# Patient Record
Sex: Male | Born: 1970
Health system: Southern US, Community
[De-identification: ages and names within clinical notes are randomized; demographics above are authoritative.]

## PROBLEM LIST (undated history)

## (undated) DIAGNOSIS — C3490 Malignant neoplasm of unspecified part of unspecified bronchus or lung: Secondary | ICD-10-CM

## (undated) DIAGNOSIS — J45909 Unspecified asthma, uncomplicated: Secondary | ICD-10-CM

## (undated) HISTORY — DX: Malignant neoplasm of unspecified part of unspecified bronchus or lung: C34.90

## (undated) HISTORY — PX: CYST EXCISION: SHX5701

---

## 2005-10-26 ENCOUNTER — Emergency Department: Payer: Self-pay | Admitting: Unknown Physician Specialty

## 2015-12-16 ENCOUNTER — Emergency Department
Admission: EM | Admit: 2015-12-16 | Discharge: 2015-12-16 | Disposition: A | Discharge status: Home / Self Care | Payer: No Typology Code available for payment source | Attending: Emergency Medicine | Admitting: Emergency Medicine

## 2015-12-16 ENCOUNTER — Encounter: Payer: Self-pay | Admitting: Emergency Medicine

## 2015-12-16 ENCOUNTER — Emergency Department: Payer: No Typology Code available for payment source

## 2015-12-16 DIAGNOSIS — S99912A Unspecified injury of left ankle, initial encounter: Secondary | ICD-10-CM | POA: Diagnosis not present

## 2015-12-16 DIAGNOSIS — F17210 Nicotine dependence, cigarettes, uncomplicated: Secondary | ICD-10-CM | POA: Insufficient documentation

## 2015-12-16 DIAGNOSIS — Y92410 Unspecified street and highway as the place of occurrence of the external cause: Secondary | ICD-10-CM | POA: Insufficient documentation

## 2015-12-16 DIAGNOSIS — Y9389 Activity, other specified: Secondary | ICD-10-CM | POA: Diagnosis not present

## 2015-12-16 DIAGNOSIS — S20212A Contusion of left front wall of thorax, initial encounter: Secondary | ICD-10-CM | POA: Diagnosis not present

## 2015-12-16 DIAGNOSIS — S299XXA Unspecified injury of thorax, initial encounter: Secondary | ICD-10-CM | POA: Diagnosis present

## 2015-12-16 DIAGNOSIS — Y998 Other external cause status: Secondary | ICD-10-CM | POA: Diagnosis not present

## 2015-12-16 DIAGNOSIS — S6992XA Unspecified injury of left wrist, hand and finger(s), initial encounter: Secondary | ICD-10-CM | POA: Insufficient documentation
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
## 2015-12-16 MED ORDER — IBUPROFEN 800 MG PO TABS: 800.0000 mg | ORAL_TABLET | Freq: Three times a day (TID) | ORAL | Status: DC | PRN

## 2015-12-16 MED ORDER — CYCLOBENZAPRINE HCL 10 MG PO TABS: 10.0000 mg | ORAL_TABLET | Freq: Three times a day (TID) | ORAL | Status: DC | PRN
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
## 2019-10-17 ENCOUNTER — Other Ambulatory Visit (HOSPITAL_COMMUNITY): Payer: Self-pay | Admitting: Physician Assistant

## 2019-10-17 ENCOUNTER — Other Ambulatory Visit: Payer: Self-pay | Admitting: Physician Assistant

## 2019-10-17 DIAGNOSIS — R918 Other nonspecific abnormal finding of lung field: Secondary | ICD-10-CM

## 2019-10-24 ENCOUNTER — Ambulatory Visit: Admission: RE | Admit: 2019-10-24 | Payer: 59 | Source: Ambulatory Visit

## 2019-10-31 ENCOUNTER — Ambulatory Visit: Payer: 59

## 2019-11-04 ENCOUNTER — Emergency Department
Admission: EM | Admit: 2019-11-04 | Discharge: 2019-11-04 | Disposition: A | Discharge status: Home / Self Care | Payer: 59 | Attending: Emergency Medicine | Admitting: Emergency Medicine

## 2019-11-04 ENCOUNTER — Emergency Department: Payer: 59

## 2019-11-04 ENCOUNTER — Encounter: Payer: Self-pay | Admitting: Oncology

## 2019-11-04 ENCOUNTER — Encounter: Payer: Self-pay | Admitting: *Deleted

## 2019-11-04 ENCOUNTER — Other Ambulatory Visit: Payer: Self-pay

## 2019-11-04 DIAGNOSIS — R079 Chest pain, unspecified: Secondary | ICD-10-CM | POA: Diagnosis present

## 2019-11-04 DIAGNOSIS — R918 Other nonspecific abnormal finding of lung field: Secondary | ICD-10-CM

## 2019-11-04 DIAGNOSIS — F17210 Nicotine dependence, cigarettes, uncomplicated: Secondary | ICD-10-CM | POA: Insufficient documentation

## 2019-11-04 LAB — BASIC METABOLIC PANEL
Anion gap: 9 (ref 5–15)
BUN: 9 mg/dL (ref 6–20)
CO2: 26 mmol/L (ref 22–32)
Calcium: 8.7 mg/dL — ABNORMAL LOW (ref 8.9–10.3)
Chloride: 103 mmol/L (ref 98–111)
Creatinine, Ser: 0.93 mg/dL (ref 0.61–1.24)
GFR calc Af Amer: >60 mL/min (ref >60)
GFR calc non Af Amer: >60 mL/min (ref >60)
Glucose, Bld: 126 mg/dL — ABNORMAL HIGH (ref 70–99)
Potassium: 3.8 mmol/L (ref 3.5–5.1)
Sodium: 138 mmol/L (ref 135–145)

## 2019-11-04 LAB — CBC
HCT: 37.8 % — ABNORMAL LOW (ref 39.0–52.0)
Hemoglobin: 12.5 g/dL — ABNORMAL LOW (ref 13.0–17.0)
MCH: 28.2 pg (ref 26.0–34.0)
MCHC: 33.1 g/dL (ref 30.0–36.0)
MCV: 85.3 fL (ref 80.0–100.0)
Platelets: 533 10*3/uL — ABNORMAL HIGH (ref 150–400)
RBC: 4.43 MIL/uL (ref 4.22–5.81)
RDW: 14.5 % (ref 11.5–15.5)
WBC: 5.7 10*3/uL (ref 4.0–10.5)
nRBC: 0 % (ref 0.0–0.2)

## 2019-11-04 LAB — TROPONIN I (HIGH SENSITIVITY): Troponin I (High Sensitivity): 3 ng/L (ref <18)

## 2019-11-04 IMAGING — CR DG CHEST 2V
1 series · 2 of 2 positions shown · non-contrast
Comparison: Chest radiograph [DATE]

CLINICAL DATA: Chest pain. Additional history provided: Chest pain
for 1 month.

EXAM:
CHEST - 2 VIEW

[Series 1: dg chest 2 view · 0.14mm/px · 2 of 2 slices shown]
[im 1/2]
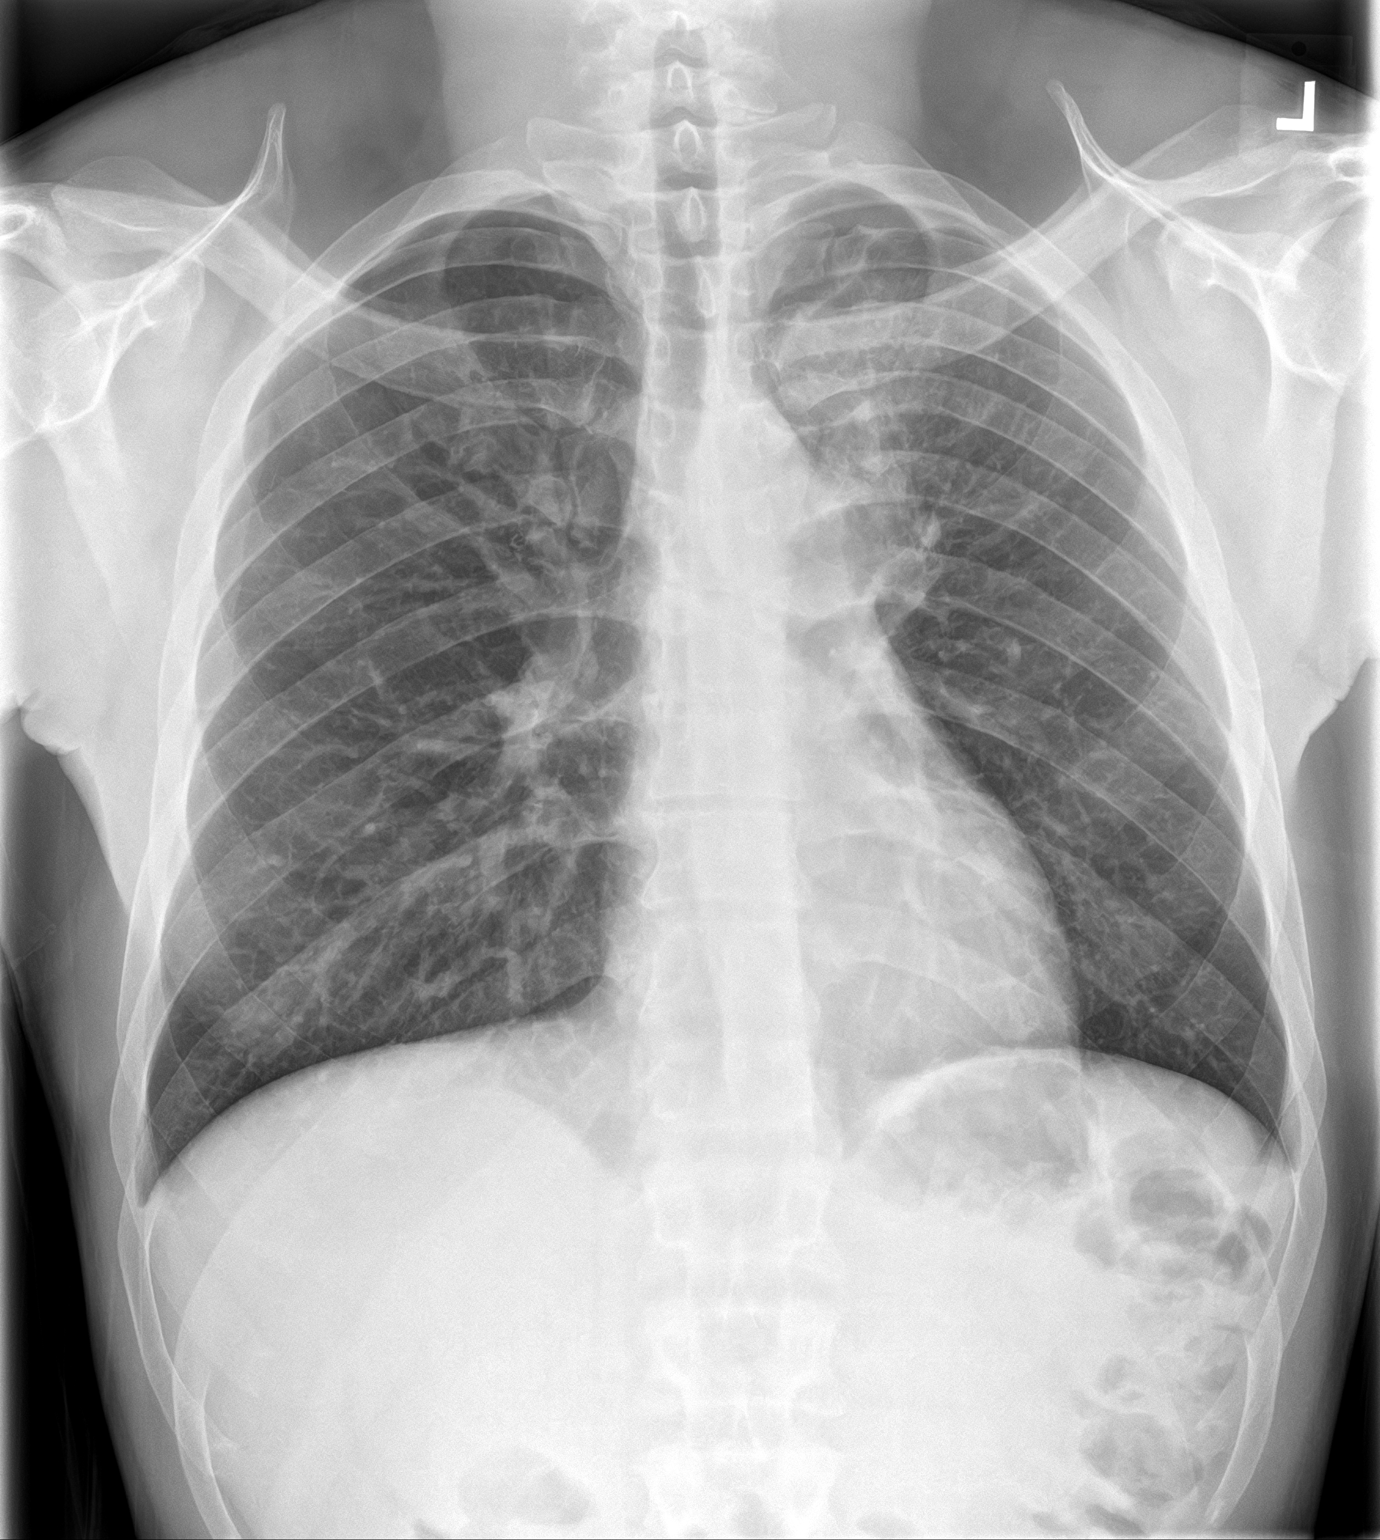
[im 2/2]
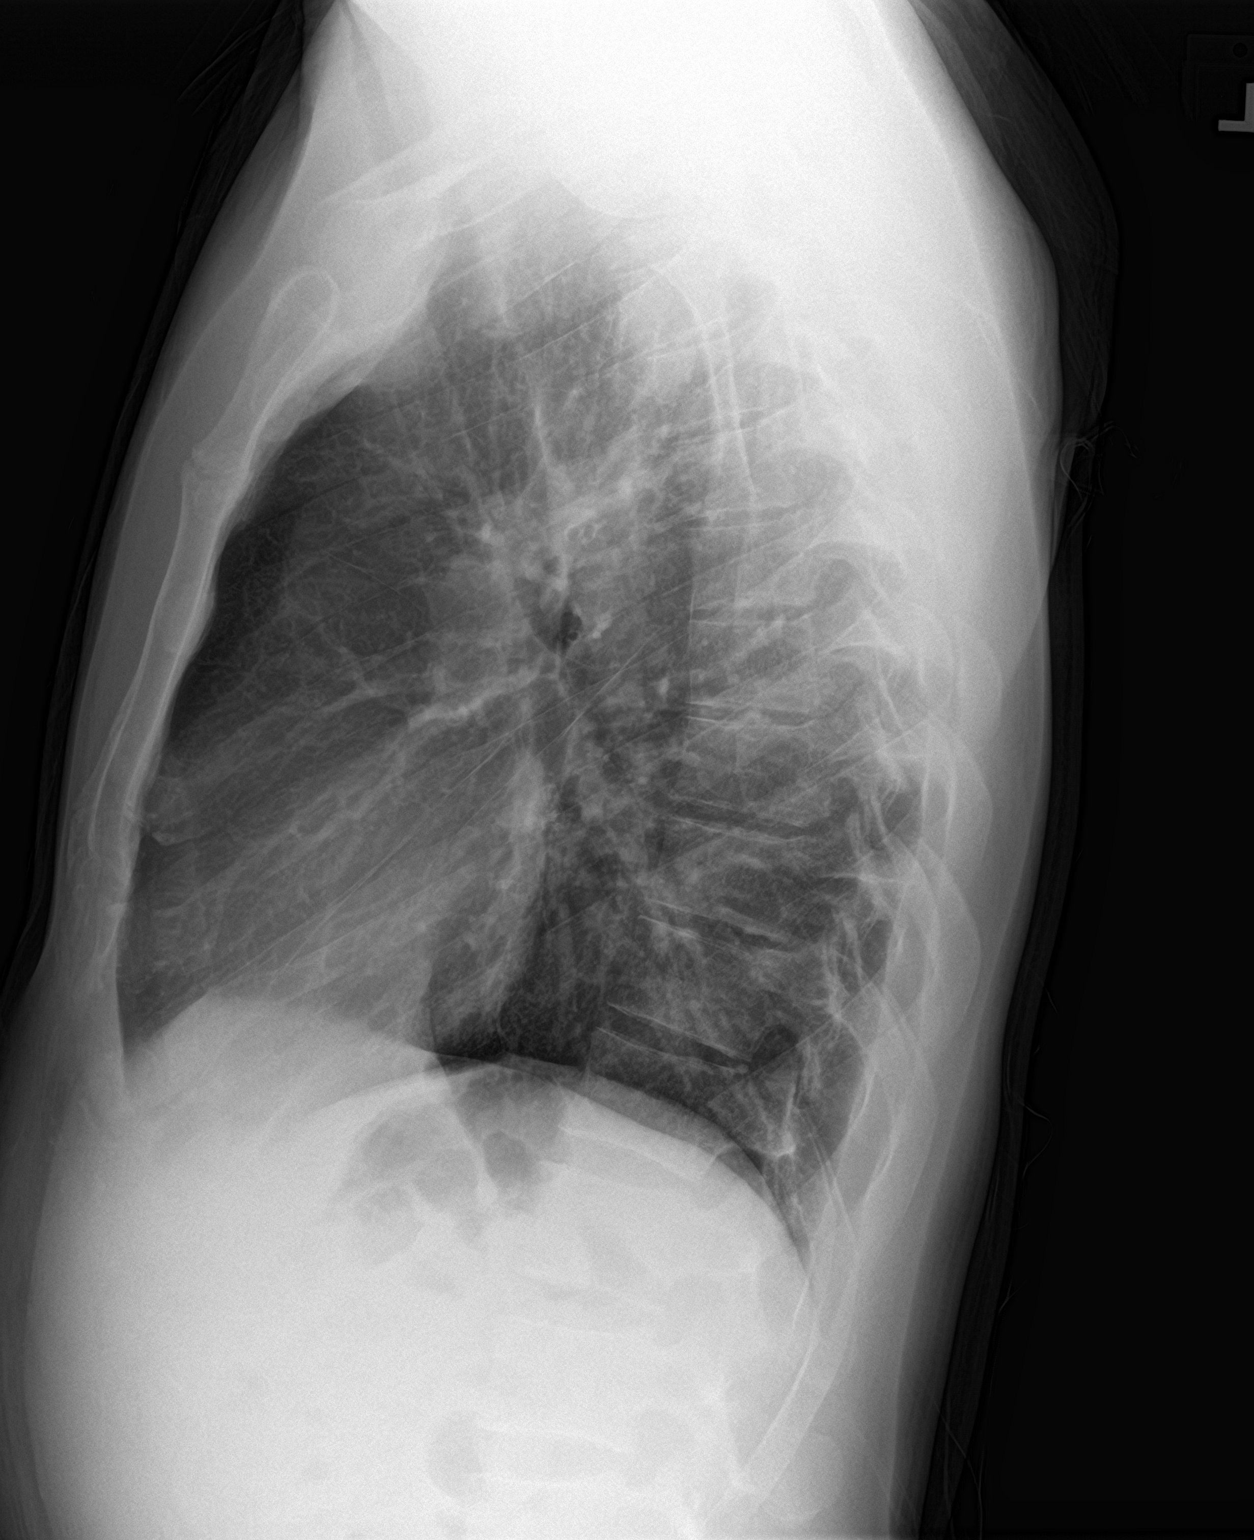

[2 of 2 positions shown; findings below may reference images not displayed]

FINDINGS: Heart size within normal limits.

There is increased density within the posterior left lung apex
likely reflecting a mass and highly suspicious for bronchogenic
carcinoma. The lungs are otherwise clear. No evidence of pleural
effusion or pneumothorax. No acute bony abnormality.

These results were called by telephone at the time of interpretation
on [DATE] at [DATE] to provider ZANDAMELA, who verbally
acknowledged these results.
IMPRESSION: Increased density within the posterior left lung apex likely
reflecting a mass and highly suspicious for bronchogenic carcinoma.
Chest CT with intravenous contrast recommended for further
evaluation.

## 2019-11-04 IMAGING — CT CT ANGIO CHEST
2 of 6 series · 18 of 46 positions shown · IV contrast (APPLIED)
Comparison: Radiograph of same day.

CLINICAL DATA: Chest pain.

EXAM:
CT ANGIOGRAPHY CHEST WITH CONTRAST
TECHNIQUE: Multidetector CT imaging of the chest was performed using the
standard protocol during bolus administration of intravenous
contrast. Multiplanar CT image reconstructions and MIPs were
obtained to evaluate the vascular anatomy.
CONTRAST:  75mL OMNIPAQUE IOHEXOL 350 MG/ML SOLN

[Series 5: thins · axial · 0.65mm/px · z∈[-295,-39]mm · 15 of 282 slices shown]
[im 13/282  lung]
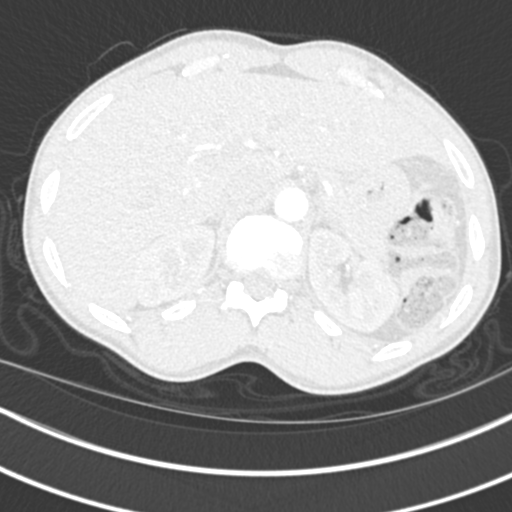
[im 37/282  soft-tissue]
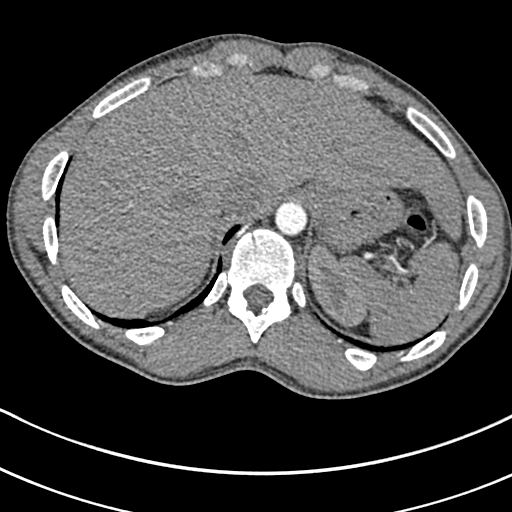
[im 49/282  lung]
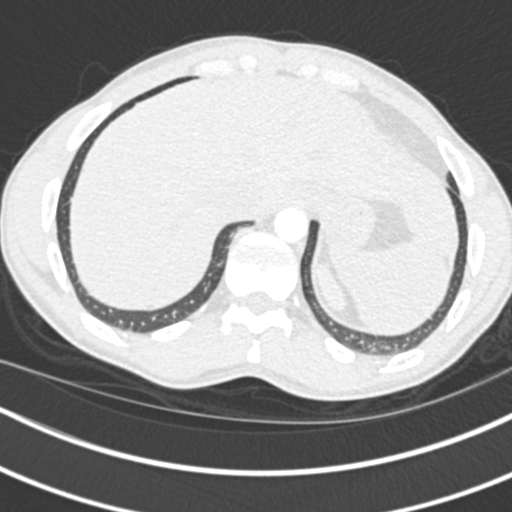
[im 74/282  soft-tissue]
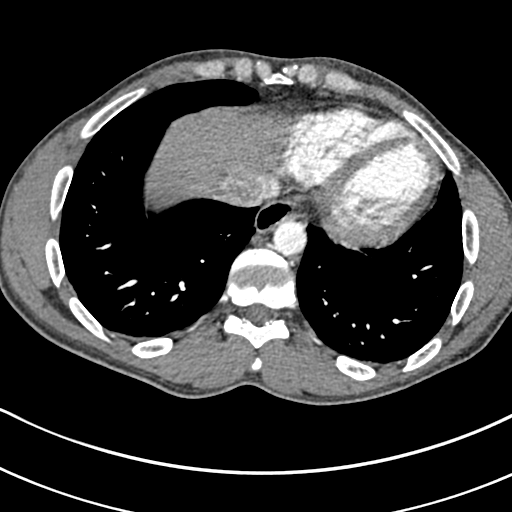
[im 86/282  lung]
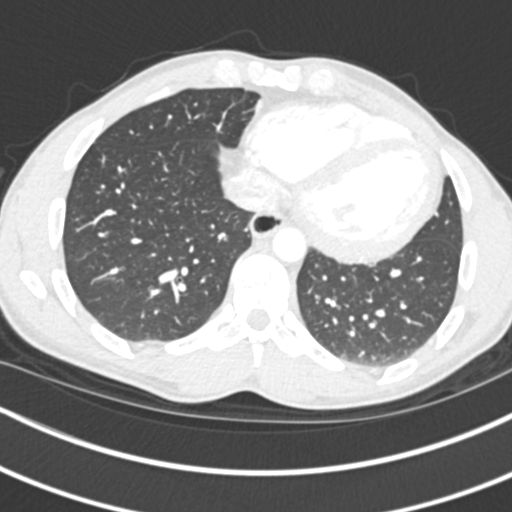
[im 110/282  soft-tissue]
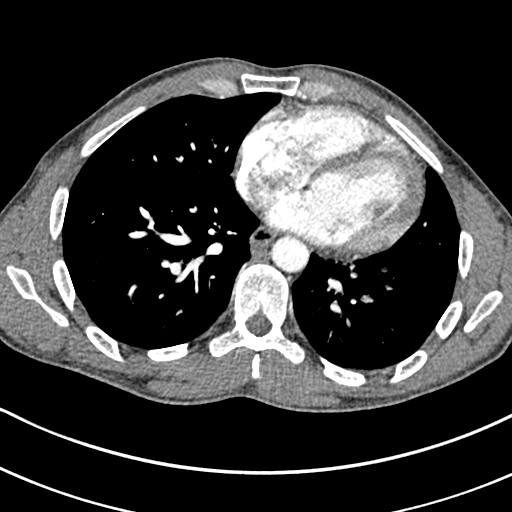
[im 123/282  lung]
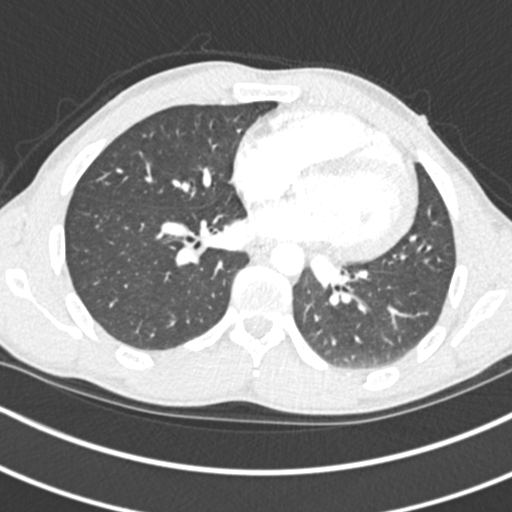
[im 147/282  soft-tissue]
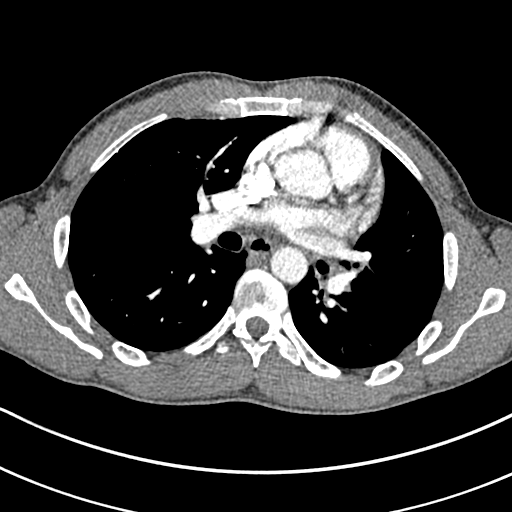
[im 159/282  lung]
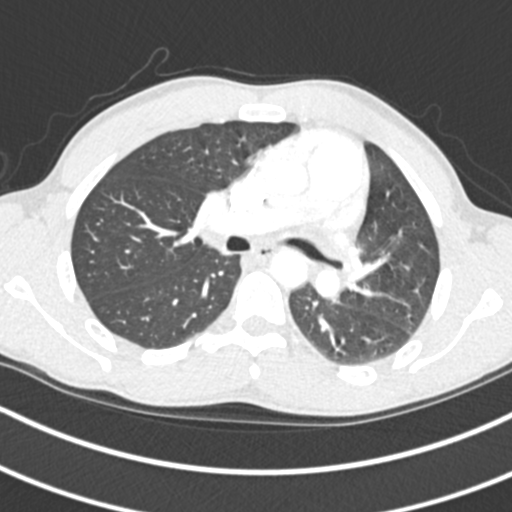
[im 172/282  soft-tissue]
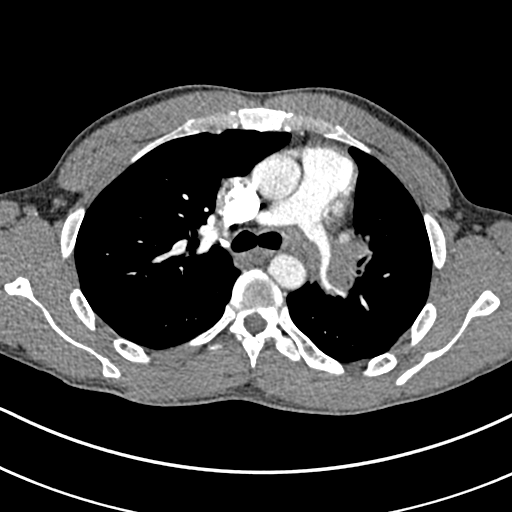
[im 196/282  lung]
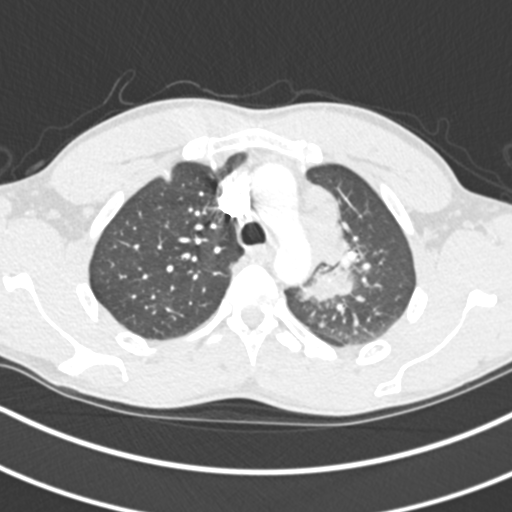
[im 208/282  soft-tissue]
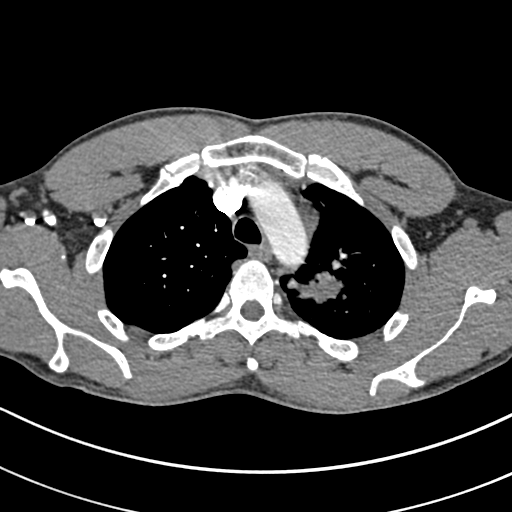
[im 233/282  lung]
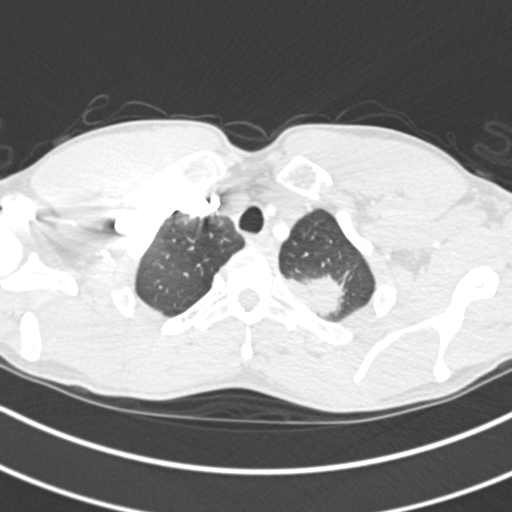
[im 245/282  soft-tissue]
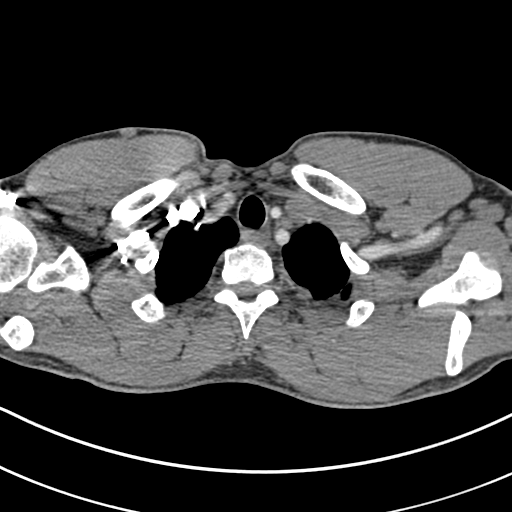
[im 269/282  lung]
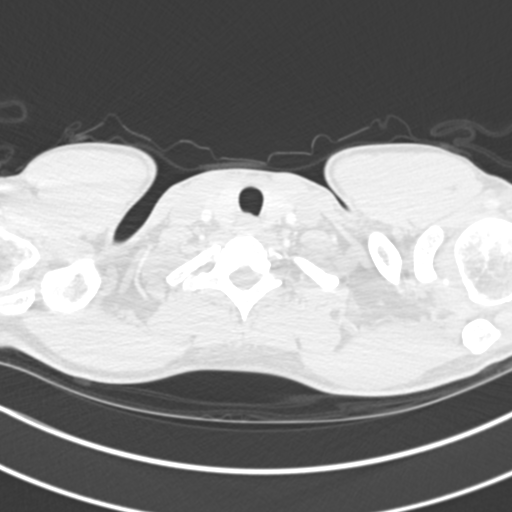

[Series 7: coronal mpr · coronal · 0.55mm/px · 3 of 86 slices shown]
[im 22/86  soft-tissue]
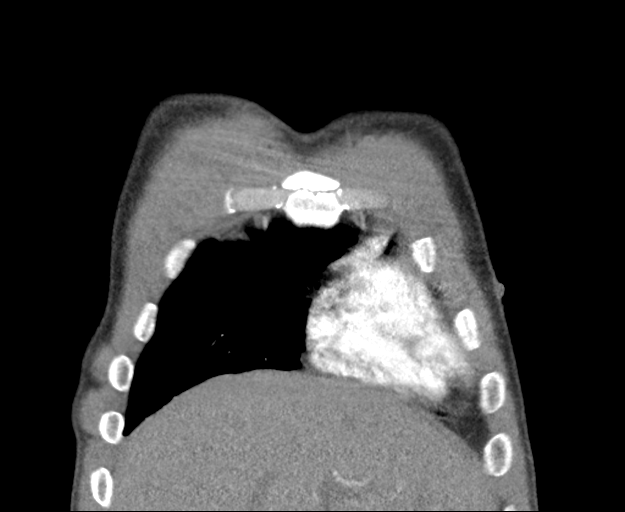
[im 43/86  soft-tissue]
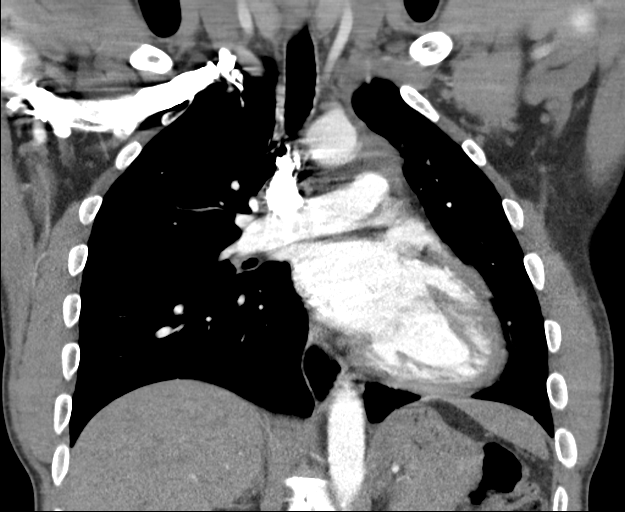
[im 64/86  soft-tissue]
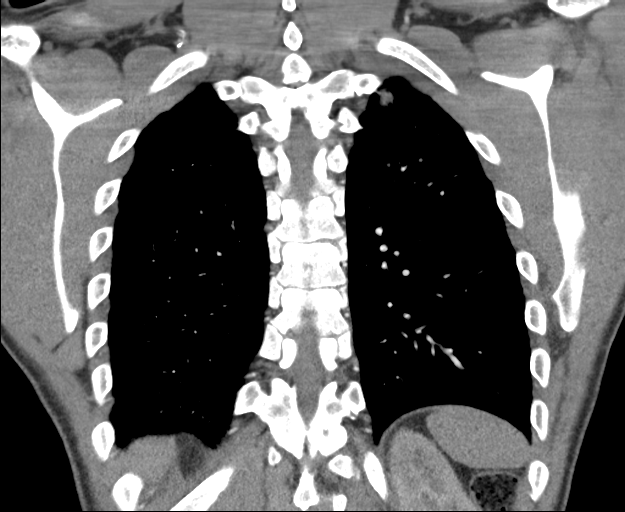

[18 of 46 positions shown; findings below may reference images not displayed]

FINDINGS: Cardiovascular: Satisfactory opacification of the pulmonary arteries
to the segmental level. No evidence of pulmonary embolism. Normal
heart size. No pericardial effusion. However, there is significant
extrinsic compression of the left pulmonary artery secondary to
surrounding adenopathy.

Mediastinum/Nodes: 4.4 x 3.3 cm lobulated mass is noted in
aortopulmonary window. 2.6 x 2.4 cm left hilar mass is noted.
Thyroid gland is unremarkable. The esophagus appears normal.

Lungs/Pleura: No pneumothorax or pleural effusion is noted. Right
lung is clear. 3.8 x 3.3 cm mass is noted in left lung apex
concerning for malignancy.

Upper Abdomen: No acute abnormality.

Musculoskeletal: No chest wall abnormality. No acute or significant
osseous findings.

Review of the MIP images confirms the above findings.
IMPRESSION: No definite evidence of pulmonary embolus.

3.8 x 3.3 cm left apical mass is noted concerning for malignancy.
Enlarged aortopulmonary window and left hilar masses or adenopathy
is noted consistent with metastatic disease. PET scan is recommended
for further evaluation.

## 2019-11-04 MED ORDER — SODIUM CHLORIDE 0.9% FLUSH: 3.0000 mL | Freq: Once | INTRAVENOUS | Status: DC

## 2019-11-04 MED ORDER — IOHEXOL 350 MG/ML SOLN
75.0000 mL | Freq: Once | INTRAVENOUS | Status: AC | PRN
  Administered 2019-11-04: 75 mL via INTRAVENOUS
  Filled 2019-11-04: qty 75

## 2019-11-04 NOTE — ED Triage Notes (Signed)
Pt c/o left sided chest pain for the past month, states his PCP did a chest xray and saw something and ordered for a CT chest but states the insurance was denying it and his PCP referred him to the ED. Pt is in NAD,

## 2019-11-04 NOTE — ED Provider Notes (Addendum)
[facility] Emergency Department Provider Note    First MD Initiated Contact with Patient 11/04/19 1254     (approximate)  I have reviewed the triage vital signs and the nursing notes.   HISTORY  Chief Complaint Chest Pain    HPI Kyle Guerrero is a 48 y.o. male cigarette smoker approximately half a pack per day for the past 20 years presents to the emergency department with a 1 month history of left upper chest discomfort.  Patient was seen at urgent care where x-ray was performed and states that he was told he needed a CT scan done however due to his lack of insurance it was never performed.  Patient states that he currently has 7 out of 10 left upper chest discomfort.  Patient also admits to intermittent cough.      History reviewed. No pertinent past medical history.  There are no active problems to display for this patient.   History reviewed. No pertinent surgical history.  Prior to Admission medications   Medication Sig Start Date End Date Taking? Authorizing Provider  cyclobenzaprine (FLEXERIL) 10 MG tablet Take 1 tablet (10 mg total) by mouth every 8 (eight) hours as needed for muscle spasms. 12/16/15   Beers, Pierce Crane, PA-C  ibuprofen (ADVIL,MOTRIN) 800 MG tablet Take 1 tablet (800 mg total) by mouth every 8 (eight) hours as needed. 12/16/15   Arlyss Repress, PA-C    Allergies Patient has no known allergies.  No family history on file.  Social History Social History   Tobacco Use  . Smoking status: Current Every Day Smoker    Packs/day: 2.00    Types: Cigarettes  . Smokeless tobacco: Never Used  Substance Use Topics  . Alcohol use: Yes  . Drug use: Yes    Types: Marijuana    Review of Systems Constitutional: No fever/chills Eyes: No visual changes. ENT: No sore throat. Cardiovascular: Positive for left chest pain Respiratory: Denies shortness of breath. Gastrointestinal: No abdominal pain.  No nausea, no vomiting.   No diarrhea.  No constipation. Genitourinary: Negative for dysuria. Musculoskeletal: Negative for neck pain.  Negative for back pain. Integumentary: Negative for rash. Neurological: Negative for headaches, focal weakness or numbness.  ____________________________________________   PHYSICAL EXAM:  VITAL SIGNS: ED Triage Vitals  Enc Vitals Group     BP 11/04/19 1005 (!) 144/70     Pulse Rate 11/04/19 1005 71     Resp 11/04/19 1005 16     Temp 11/04/19 1005 97.9 F (36.6 C)     Temp Source 11/04/19 1005 Oral     SpO2 11/04/19 1005 100 %     Weight 11/04/19 1006 76.2 kg (168 lb)     Height 11/04/19 1006 1.803 m (5\' 11" )     Head Circumference --      Peak Flow --      Pain Score 11/04/19 1006 7     Pain Loc --      Pain Edu? --      Excl. in [place]? --     Constitutional: Alert and oriented.  Eyes: Conjunctivae are normal.  Mouth/Throat: Patient is wearing a mask. Neck: No stridor.  No meningeal signs.   Cardiovascular: Normal rate, regular rhythm. Good peripheral circulation. Grossly normal heart sounds. Respiratory: Normal respiratory effort.  No retractions. Gastrointestinal: Soft and nontender. No distention.  Musculoskeletal: No lower extremity tenderness nor edema. No gross deformities of extremities. Neurologic:  Normal speech and language. No gross focal  neurologic deficits are appreciated.  Skin:  Skin is warm, dry and intact. Psychiatric: Mood and affect are normal. Speech and behavior are normal.  ____________________________________________   LABS (all labs ordered are listed, but only abnormal results are displayed)  Labs Reviewed  BASIC METABOLIC PANEL - Abnormal; Notable for the following components:      Result Value   Glucose, Bld 126 (*)    Calcium 8.7 (*)    All other components within normal limits  CBC - Abnormal; Notable for the following components:   Hemoglobin 12.5 (*)    HCT 37.8 (*)    Platelets 533 (*)    All other components within normal  limits  TROPONIN I (HIGH SENSITIVITY)  TROPONIN I (HIGH SENSITIVITY)   ______________________________ ____________________________________________  RADIOLOGY I, Gregor Hams, personally viewed and evaluated these images (plain radiographs) as part of my medical decision making, as well as reviewing the written report by the radiologist.  ED MD interpretation: Left apex mass concerning for bronchogenic carcinoma per radiologist with recommendation for CT chest to be performed.  Official radiology report(s): Dg Chest 2 View  Result Date: 11/04/2019 CLINICAL DATA:  Chest pain. Additional history provided: Chest pain for 1 month. EXAM: CHEST - 2 VIEW COMPARISON:  Chest radiograph 12/17/2015 FINDINGS: Heart size within normal limits. There is increased density within the posterior left lung apex likely reflecting a mass and highly suspicious for bronchogenic carcinoma. The lungs are otherwise clear. No evidence of pleural effusion or pneumothorax. No acute bony abnormality. These results were called by telephone at the time of interpretation on 11/04/2019 at 11:05 am to provider [facility], who verbally acknowledged these results. IMPRESSION: Increased density within the posterior left lung apex likely reflecting a mass and highly suspicious for bronchogenic carcinoma. Chest CT with intravenous contrast recommended for further evaluation. Electronically Signed   By: Kellie Simmering DO   On: 11/04/2019 11:06      Procedures  ED ECG REPORT I, Grandfather Ernst Bowler, the attending physician, personally viewed and interpreted this ECG.   Date: 11/15/2019  EKG Time: 10:24 AM  Rate: 68  Rhythm: Normal Sinus Rhythm  Axis: Normal  Intervals:Normal  ST&T Change: None  ____________________________________________   INITIAL IMPRESSION / MDM / ASSESSMENT AND PLAN / ED COURSE  As part of my medical decision making, I reviewed the following data within the electronic MEDICAL RECORD NUMBER  48 year old male  presenting with above-stated history and physical exam concerning for possible lung cancer.  Chest x-ray revealed left apex mass and as such CT scan of the chest was performed.  CT scan system with chest x-ray finding and concerning for possible hilar lymphadenopathy due to metastases.  I spoke with the patient at length regarding all clinical findings.  I advised the patient of the absolute necessity of following up with oncology Dr. Janese Banks.  I spoke with Dr. Janese Banks personally who spoke with RN from the oncology clinic quite expeditiously to ensure follow-up with the patient.  ____________________________________________  FINAL CLINICAL IMPRESSION(S) / ED DIAGNOSES  Final diagnoses:  Mass of left lung     MEDICATIONS GIVEN DURING THIS VISIT:  Medications  sodium chloride flush (NS) 0.9 % injection 3 mL (has no administration in time range)  iohexol (OMNIPAQUE) 350 MG/ML injection 75 mL (has no administration in time range)     ED Discharge Orders    None      *Please note:  Kyle Guerrero was evaluated in Emergency Department on 11/04/2019 for  the symptoms described in the history of present illness. He was evaluated in the context of the global COVID-19 pandemic, which necessitated consideration that the patient might be at risk for infection with the SARS-CoV-2 virus that causes COVID-19. Institutional protocols and algorithms that pertain to the evaluation of patients at risk for COVID-19 are in a state of rapid change based on information released by regulatory bodies including the CDC and federal and state organizations. These policies and algorithms were followed during the patient's care in the ED.  Some ED evaluations and interventions may be delayed as a result of limited staffing during the pandemic.*  Note:  This document was prepared using Dragon voice recognition software and may include unintentional dictation errors.   Gregor Hams, MD 11/04/19 1408    Gregor Hams, MD 11/15/19 [phone]

## 2019-11-04 NOTE — Progress Notes (Signed)
Informed by Dr. Owens Shark from ER. Patient is 48 yr old presenting with chest pain. CT PE shows 3.8 cm LUL lung mass and mediastinal masses/ adenopathy. He needs PET CT for further evaluation. I will see him in my clinic after PET/CT  Dr. Randa Evens, MD, MPH [facility] [phone] 11/04/2019 1:58 PM

## 2019-11-04 NOTE — ED Provider Notes (Signed)
[facility] Emergency Department Provider Note  ____________________________________________   None    (approximate)   I have reviewed the triage vital signs and the nursing notes.   Patient has been triaged with a MSE exam performed by myself at a minimum. Based on symptoms and screening exam, patient may receive a more in-depth exam, labs, imaging as detailed below. Patients have been advised of this setting and exam type at the time of patient interview.    HISTORY  Chief Complaint Chest Pain    HPI Kyle Guerrero is a 48 y.o. male presents to the emergency department with a complaint of cp and pressure.  Had a cxr and was told there is something there and needs a ct.  + smoker, no abd pain, no sob   Patient will receive a medical screening exam as detailed below.  Based off of this exam, more in depth exam, labs, imaging will be performed as needed for complaint.  Patient care will be eventually transferred to another provider in the emergency department for final exam, diagnosis and disposition.    History reviewed. No pertinent past medical history.  There are no active problems to display for this patient.   History reviewed. No pertinent surgical history.  Prior to Admission medications   Medication Sig Start Date End Date Taking? Authorizing Provider  cyclobenzaprine (FLEXERIL) 10 MG tablet Take 1 tablet (10 mg total) by mouth every 8 (eight) hours as needed for muscle spasms. 12/16/15   Beers, Pierce Crane, PA-C  ibuprofen (ADVIL,MOTRIN) 800 MG tablet Take 1 tablet (800 mg total) by mouth every 8 (eight) hours as needed. 12/16/15   Arlyss Repress, PA-C    Allergies Patient has no known allergies.  No family history on file.  Social History Social History   Tobacco Use  . Smoking status: Current Every Day Smoker    Packs/day: 2.00    Types: Cigarettes  . Smokeless tobacco: Never Used  Substance Use Topics  . Alcohol use: Yes   . Drug use: Yes    Types: Marijuana    Review of Systems Constitutional: denies fever ENT: deniesnasal congestion/rhinorhea. deniessore throat Cardiovascular: positive chest pain. Respiratory: no cough. noshortness of breath/difficulty breathing, chest feels tight Gastroenterology: no abdominal pain Musculoskeletal: no for musculoskeletal pain Integumentary: Negative for rash. Neurological: No focal weakness nor numbness.   ____________________________________________   PHYSICAL EXAM:  VITAL SIGNS: ED Triage Vitals  Enc Vitals Group     BP 11/04/19 1005 (!) 144/70     Pulse Rate 11/04/19 1005 71     Resp 11/04/19 1005 16     Temp 11/04/19 1005 97.9 F (36.6 C)     Temp Source 11/04/19 1005 Oral     SpO2 11/04/19 1005 100 %     Weight 11/04/19 1006 168 lb (76.2 kg)     Height 11/04/19 1006 5\' 11"  (1.803 m)     Head Circumference --      Peak Flow --      Pain Score 11/04/19 1006 7     Pain Loc --      Pain Edu? --      Excl. in [place]? --     Constitutional: Alert and oriented. Generally well appearing and in no acute distress. Eyes: Conjunctivae are normal.  Nose: No significant congestion/rhinnorhea. Mouth: No gross oropharyngeal edema. no erythema/edema Neck: No stridor.  No meningeal signs.   Cardiovascular: Grossly normal heart sounds. Respiratory: Normal respiratory effort without significant  tachypnea and no observed retractions. Lungs c t a Musculoskeletal: No gross deformities of extremities. Neurologic:  Normal speech and language. No gross focal neurologic deficits are appreciated.  Skin:  Skin is warm, dry and intact. No rash noted.    ____________________________________________   LABS (all labs ordered are listed, but only abnormal results are displayed)  Labs Reviewed  BASIC METABOLIC PANEL - Abnormal; Notable for the following components:      Result Value   Glucose, Bld 126 (*)    Calcium 8.7 (*)    All other components within normal limits   CBC - Abnormal; Notable for the following components:   Hemoglobin 12.5 (*)    HCT 37.8 (*)    Platelets 533 (*)    All other components within normal limits  TROPONIN I (HIGH SENSITIVITY)  TROPONIN I (HIGH SENSITIVITY)    ____________________________________________   RADIOLOGY Cherlynn Kaiser, personally viewed and evaluated these images (plain radiographs) as part of my medical decision making, as well as reviewing the written report by the radiologist.**}  Official radiology report(s): Dg Chest 2 View  Result Date: 11/04/2019 CLINICAL DATA:  Chest pain. Additional history provided: Chest pain for 1 month. EXAM: CHEST - 2 VIEW COMPARISON:  Chest radiograph 12/17/2015 FINDINGS: Heart size within normal limits. There is increased density within the posterior left lung apex likely reflecting a mass and highly suspicious for bronchogenic carcinoma. The lungs are otherwise clear. No evidence of pleural effusion or pneumothorax. No acute bony abnormality. These results were called by telephone at the time of interpretation on 11/04/2019 at 11:05 am to provider [facility], who verbally acknowledged these results. IMPRESSION: Increased density within the posterior left lung apex likely reflecting a mass and highly suspicious for bronchogenic carcinoma. Chest CT with intravenous contrast recommended for further evaluation. Electronically Signed   By: Kellie Simmering DO   On: 11/04/2019 11:06    ____________________________________________    INITIAL IMPRESSION / MDM / ASSESSMENT AND PLAN / ED COURSE  As part of my medical decision making, I reviewed the following data within the [place] {     Clinical Impression: chest mass  Plan: ct chest, refer as needed  Patient has been screened based based on their arrival complaint, evaluated for an emergent condition, and at a minimum has received a medical screening exam.  At this time, patient will receive further work-up as  determined by medical screening exam.  Patient care will eventually be transferred to another provider in the emergency department for final diagnosis and disposition.   Kyle Guerrero was evaluated in Emergency Department on 11/04/2019 for the symptoms described in the history of present illness. He was evaluated in the context of the global COVID-19 pandemic, which necessitated consideration that the patient might be at risk for infection with the SARS-CoV-2 virus that causes COVID-19. Institutional protocols and algorithms that pertain to the evaluation of patients at risk for COVID-19 are in a state of rapid change based on information released by regulatory bodies including the CDC and federal and state organizations. These policies and algorithms were followed during the patient's care in the ED.   ____________________________________________  Note:  This document was prepared using Dragon voice recognition software and may include unintentional dictation errors.    Versie Starks, PA-C 11/04/19 1234    Earleen Newport, MD 11/04/19 1308

## 2019-11-05 ENCOUNTER — Telehealth: Payer: Self-pay | Admitting: *Deleted

## 2019-11-05 NOTE — Telephone Encounter (Signed)
Spoke with patient and he is aware of his upcoming appts.

## 2019-11-05 NOTE — Telephone Encounter (Signed)
Hayley will call him today

## 2019-11-05 NOTE — Progress Notes (Signed)
  Oncology Nurse Navigator Documentation  Navigator Location: [facility]Med Onc (11/05/19 1400) Referral Date to RadOnc/MedOnc: 11/04/19 (11/05/19 1400) )Navigator Encounter Type: Introductory Phone Call (11/05/19 1400)   Abnormal Finding Date: 11/04/19 (11/05/19 1400)                   Treatment Phase: Abnormal Scans (11/05/19 1400) Barriers/Navigation Needs: Coordination of Care (11/05/19 1400)   Interventions: Coordination of Care (11/05/19 1400)   Coordination of Care: Appts;Radiology (11/05/19 1400)     phone call made to patient to review upcoming appts and introduce to navigator services. All questions answered during phone call. Reviewed prep prior to PET scan. Contact info given and instructed to call with any further questions or needs. Pt verbalized understanding. Nothing further needed at this time.             Time Spent with Patient: 30 (11/05/19 1400)

## 2019-11-05 NOTE — Telephone Encounter (Signed)
Patient called stating he was in ER yesterday and they found a tumor in his lung and he is asking to speak with Dr Janese Banks about this. [phone]  IMPRESSION: No definite evidence of pulmonary embolus.  3.8 x 3.3 cm left apical mass is noted concerning for malignancy. Enlarged aortopulmonary window and left hilar masses or adenopathy is noted consistent with metastatic disease. PET scan is recommended for further evaluation.   Electronically Signed   By: Marijo Conception M.D.   On: 11/04/2019 13:22

## 2019-11-11 ENCOUNTER — Telehealth: Payer: Self-pay | Admitting: *Deleted

## 2019-11-11 NOTE — Telephone Encounter (Signed)
Called scheduling about his pet scan was cancelled . They cancelled it on Friday because they did not have authorization. It got moved to 11/23. The slot for 11/17has been taken and no available times until 11/23.  I called pt because he was contacted about his appt for pet r/s and he wanted his md appt cancelled also. When I spoke to pt he said that he does not have any time left and can't take off 2 different days and he does not see the need to see md when the PET has not been done yet. It Is waste of his time and he can't afford to come 2 different times. I asked him if it is ok to get him on Janese Banks schedule for the same day and he says yes. He is off that day and it will be ok. I will check schedule and let him know. I called the pt. And he is agreeable to 11/23 at 3 pm

## 2019-11-12 ENCOUNTER — Inpatient Hospital Stay: Payer: 59 | Admitting: Oncology

## 2019-11-14 ENCOUNTER — Telehealth: Payer: Self-pay | Admitting: *Deleted

## 2019-11-14 NOTE — Telephone Encounter (Signed)
Spoke with patient regarding upcoming appts. Pt stated that he would like to cancel his appts since he wishes to receive his care at [facility] due to delays with appts at [facility]. Advised pt to keep appt for PET scan on Monday and follow up with Dr. Janese Banks to discuss the results. Informed pt that if wishes to seek care at [facility] then referral can be made at that time. Pt informed that this process would expedite his care without further delays. Pt verbalized understanding and agreed to keep appts on 11/23.

## 2019-11-15 ENCOUNTER — Telehealth: Payer: Self-pay | Admitting: Pulmonary Disease

## 2019-11-15 ENCOUNTER — Ambulatory Visit: Payer: 59 | Admitting: Pulmonary Disease

## 2019-11-15 ENCOUNTER — Institutional Professional Consult (permissible substitution): Payer: 59 | Admitting: Pulmonary Disease

## 2019-11-15 ENCOUNTER — Other Ambulatory Visit: Payer: Self-pay

## 2019-11-15 ENCOUNTER — Encounter: Payer: Self-pay | Admitting: Pulmonary Disease

## 2019-11-15 VITALS — BP 118/84 | HR 74 | Temp 98.0°F | Ht 71.0 in | Wt 163.4 lb

## 2019-11-15 DIAGNOSIS — R918 Other nonspecific abnormal finding of lung field: Secondary | ICD-10-CM | POA: Diagnosis not present

## 2019-11-15 DIAGNOSIS — R590 Localized enlarged lymph nodes: Secondary | ICD-10-CM

## 2019-11-15 NOTE — Patient Instructions (Signed)
1.  We are scheduling a bronchoscopy for November 27 at 1 PM (Friday).  You will receive more information from the [facility].  They will call you with more details.  You will need to be Covid tested before the procedure.  2.  We will schedule to follow-up here in 2 weeks time.  I will call you however prior to that follow-up with the results of your biopsies.  It usually takes 3 to 4 working days after the biopsy to have a definitive final diagnosis.  Sometimes it is quicker than that.  But in any event I will be communicating with you.

## 2019-11-15 NOTE — Progress Notes (Signed)
Subjective:    Patient ID: Kyle Guerrero, male    DOB: Nov 20, 1971, 48 y.o.   MRN: [id number]  HPI Patient is a 48 year old recent former smoker (quit 11/08/2019) who presents for evaluation and management of a LEFT lung mass and mediastinal mass/adenopathy.  Patient presented to the emergency room at [facility] on 9 November with a 1 month history of left chest discomfort.  On 9 November it became unbearable and he felt that across the whole entire precordium.  He stated that it felt like a two-tone weight had been placed on his chest.  He had had intermittent nonproductive cough, no hemoptysis.  He also noted a 1 month history of voice change.  Off to 13 November he had been smoking 2 packs of cigarettes per day and has done so for 20+ years.  He has not noted any weight loss or anorexia.  No fevers, chills or sweats.  No other issues.  Past medical, past surgical and family history have been reviewed.  His past medical history is essentially unremarkable.  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  As noted he has smoked 2 packs of cigarettes per day for 20+ years.  Review of Systems  Constitutional: Negative.   HENT: Negative.   Eyes: Negative.   Respiratory: Positive for cough and chest tightness.   Cardiovascular: Positive for chest pain (Chest wall pain).  Gastrointestinal: Negative.   Endocrine: Negative.   Genitourinary: Negative.   Musculoskeletal: Negative.   Skin: Negative.   Allergic/Immunologic: Negative.   Neurological: Negative.   Hematological: Negative.   Psychiatric/Behavioral: The patient is nervous/anxious.   All other systems reviewed and are negative.      Objective:   Physical Exam Vitals signs and nursing note reviewed.  Constitutional:      General: He is not in acute distress.    Appearance: Normal appearance. He is underweight. He is not ill-appearing.  HENT:     Head: Normocephalic and  atraumatic.     Right Ear: External ear normal.     Left Ear: External ear normal.     Nose:     Comments: Nose/mouth/throat not examined due to masking requirements for COVID 19. Eyes:     General: No scleral icterus.    Conjunctiva/sclera: Conjunctivae normal.     Pupils: Pupils are equal, round, and reactive to light.  Neck:     Musculoskeletal: Neck supple.     Thyroid: No thyromegaly.     Vascular: No JVD.     Trachea: Trachea normal.     Comments: Intermittent hoarseness Cardiovascular:     Rate and Rhythm: Normal rate and regular rhythm.     Pulses: Normal pulses.     Heart sounds: Normal heart sounds. No murmur.  Pulmonary:     Effort: Pulmonary effort is normal.     Breath sounds: Normal breath sounds.  Abdominal:     General: Abdomen is flat. There is no distension.  Musculoskeletal: Normal range of motion.     Right lower leg: No edema.     Left lower leg: No edema.  Skin:    General: Skin is warm and dry.     Nails: There is clubbing.  Neurological:     General: No focal deficit present.     Mental Status: He is alert and oriented to person, place, and time.  Psychiatric:        Attention  and Perception: Attention normal.        Mood and Affect: Mood is anxious.        Speech: Speech normal.        Behavior: Behavior is hyperactive.        Cognition and Memory: Cognition normal.   BP 118/84 (BP Location: Left Arm, Cuff Size: Normal)   Pulse 74   Temp 98 F (36.7 C) (Temporal)   Ht 5\' 11"  (1.803 m)   Wt 163 lb 6.4 oz (74.1 kg)   SpO2 98%   BMI 22.79 kg/m    CT scan independently reviewed.  Labs were also reviewed with the patient.  CT scan performed on 04 November 2019 with findings as follows: 3.8 x 3.3 cm left upper lobe mass.  Associated aortopulmonary window and left hilar mass/adenopathy consistent with metastatic disease.  There is impingement of the left pulmonary artery.  This appears to be extrinsic impingement:         Assessment & Plan:    1.  Left upper lobe mass with associated mediastinal adenopathy: This is carcinoma until proven otherwise.  The patient will have PET/CT on 23 November.  Tissue diagnosis is best obtained by bronchoscopy with electro magnetic navigation for diagnosis of the left upper lobe mass and endobronchial ultrasound with TBNA for the mediastinal aspect.  The patient understands the procedure will have to be done under general anesthesia.  Benefits, limitations and potential complications of the procedure were discussed with the patient/family  including, but not limited to bleeding, hemoptysis, respiratory failure requiring intubation and/or prolongued mechanical ventilation, infection, pneumothorax (collapse of lung) requiring chest tube placement, stroke from air embolism or even death.  Patient had opportunity to ask questions that were answered to his satisfaction with regards to the procedure.  Patient agrees to proceed with bronchoscopy with electromagnetic navigation and endobronchial ultrasound and biopsies as necessary.  This is scheduled for 27 November at 1 PM.  Patient will be scheduled for COVID-19 testing prior to that.  2.  Recent former smoker: Patient was commended on his discontinuation of smoking.   Renold Don, MD [facility]   This note was dictated using voice recognition software/Dragon.  Despite best efforts to proofread, errors can occur which can change the meaning.  Any change was purely unintentional.

## 2019-11-15 NOTE — Telephone Encounter (Signed)
Called [employer] at 1:40 pm EST Spoke with Cristie Hem.  Procedure code [phone] and [phone] does not require PA. Call Ref # [id number].  Procedure code [phone] does require PA. Call Ref # [id number] requested that clinicals sent to [facility] for review. Waiting on decision. [name]

## 2019-11-15 NOTE — Telephone Encounter (Signed)
Pre admit scheduled for 11/18/2019 between 8-1. This will be a phone visit.  covid test will be 11/19/2019 prior to 11:00a.  Lm to make pt aware.

## 2019-11-15 NOTE — Telephone Encounter (Signed)
Pt is scheduled for EBUS/ENB on 11/22/2019 at 1:00p  NK:NLZJ mass and mediastinal adenopathy CPT:31652,31653,31627

## 2019-11-15 NOTE — H&P (View-Only) (Signed)
Subjective:    Patient ID: Kyle Guerrero, male    DOB: 12-06-1971, 48 y.o.   MRN: [id number]  HPI Patient is a 48 year old recent former smoker (quit 11/08/2019) who presents for evaluation and management of a LEFT lung mass and mediastinal mass/adenopathy.  Patient presented to the emergency room at [facility] on 9 November with a 1 month history of left chest discomfort.  On 9 November it became unbearable and he felt that across the whole entire precordium.  He stated that it felt like a two-tone weight had been placed on his chest.  He had had intermittent nonproductive cough, no hemoptysis.  He also noted a 1 month history of voice change.  Off to 13 November he had been smoking 2 packs of cigarettes per day and has done so for 20+ years.  He has not noted any weight loss or anorexia.  No fevers, chills or sweats.  No other issues.  Past medical, past surgical and family history have been reviewed.  His past medical history is essentially unremarkable.  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  As noted he has smoked 2 packs of cigarettes per day for 20+ years.  Review of Systems  Constitutional: Negative.   HENT: Negative.   Eyes: Negative.   Respiratory: Positive for cough and chest tightness.   Cardiovascular: Positive for chest pain (Chest wall pain).  Gastrointestinal: Negative.   Endocrine: Negative.   Genitourinary: Negative.   Musculoskeletal: Negative.   Skin: Negative.   Allergic/Immunologic: Negative.   Neurological: Negative.   Hematological: Negative.   Psychiatric/Behavioral: The patient is nervous/anxious.   All other systems reviewed and are negative.      Objective:   Physical Exam Vitals signs and nursing note reviewed.  Constitutional:      General: He is not in acute distress.    Appearance: Normal appearance. He is underweight. He is not ill-appearing.  HENT:     Head: Normocephalic and  atraumatic.     Right Ear: External ear normal.     Left Ear: External ear normal.     Nose:     Comments: Nose/mouth/throat not examined due to masking requirements for COVID 19. Eyes:     General: No scleral icterus.    Conjunctiva/sclera: Conjunctivae normal.     Pupils: Pupils are equal, round, and reactive to light.  Neck:     Musculoskeletal: Neck supple.     Thyroid: No thyromegaly.     Vascular: No JVD.     Trachea: Trachea normal.     Comments: Intermittent hoarseness Cardiovascular:     Rate and Rhythm: Normal rate and regular rhythm.     Pulses: Normal pulses.     Heart sounds: Normal heart sounds. No murmur.  Pulmonary:     Effort: Pulmonary effort is normal.     Breath sounds: Normal breath sounds.  Abdominal:     General: Abdomen is flat. There is no distension.  Musculoskeletal: Normal range of motion.     Right lower leg: No edema.     Left lower leg: No edema.  Skin:    General: Skin is warm and dry.     Nails: There is clubbing.  Neurological:     General: No focal deficit present.     Mental Status: He is alert and oriented to person, place, and time.  Psychiatric:        Attention  and Perception: Attention normal.        Mood and Affect: Mood is anxious.        Speech: Speech normal.        Behavior: Behavior is hyperactive.        Cognition and Memory: Cognition normal.   BP 118/84 (BP Location: Left Arm, Cuff Size: Normal)   Pulse 74   Temp 98 F (36.7 C) (Temporal)   Ht 5\' 11"  (1.803 m)   Wt 163 lb 6.4 oz (74.1 kg)   SpO2 98%   BMI 22.79 kg/m    CT scan independently reviewed.  Labs were also reviewed with the patient.  CT scan performed on 04 November 2019 with findings as follows: 3.8 x 3.3 cm left upper lobe mass.  Associated aortopulmonary window and left hilar mass/adenopathy consistent with metastatic disease.  There is impingement of the left pulmonary artery.  This appears to be extrinsic impingement:         Assessment & Plan:    1.  Left upper lobe mass with associated mediastinal adenopathy: This is carcinoma until proven otherwise.  The patient will have PET/CT on 23 November.  Tissue diagnosis is best obtained by bronchoscopy with electro magnetic navigation for diagnosis of the left upper lobe mass and endobronchial ultrasound with TBNA for the mediastinal aspect.  The patient understands the procedure will have to be done under general anesthesia.  Benefits, limitations and potential complications of the procedure were discussed with the patient/family  including, but not limited to bleeding, hemoptysis, respiratory failure requiring intubation and/or prolongued mechanical ventilation, infection, pneumothorax (collapse of lung) requiring chest tube placement, stroke from air embolism or even death.  Patient had opportunity to ask questions that were answered to his satisfaction with regards to the procedure.  Patient agrees to proceed with bronchoscopy with electromagnetic navigation and endobronchial ultrasound and biopsies as necessary.  This is scheduled for 27 November at 1 PM.  Patient will be scheduled for COVID-19 testing prior to that.  2.  Recent former smoker: Patient was commended on his discontinuation of smoking.   Renold Don, MD [place] [facility]   This note was dictated using voice recognition software/Dragon.  Despite best efforts to proofread, errors can occur which can change the meaning.  Any change was purely unintentional.

## 2019-11-18 ENCOUNTER — Encounter
Admission: RE | Admit: 2019-11-18 | Discharge: 2019-11-18 | Disposition: A | Discharge status: Home / Self Care | Payer: 59 | Source: Ambulatory Visit | Attending: Pulmonary Disease | Admitting: Pulmonary Disease

## 2019-11-18 ENCOUNTER — Ambulatory Visit
Admission: RE | Admit: 2019-11-18 | Discharge: 2019-11-18 | Disposition: A | Discharge status: Home / Self Care | Payer: 59 | Source: Ambulatory Visit | Attending: Oncology | Admitting: Oncology

## 2019-11-18 ENCOUNTER — Other Ambulatory Visit: Payer: Self-pay

## 2019-11-18 ENCOUNTER — Encounter: Payer: Self-pay | Admitting: *Deleted

## 2019-11-18 ENCOUNTER — Inpatient Hospital Stay: Payer: 59 | Attending: Oncology | Admitting: Oncology

## 2019-11-18 ENCOUNTER — Encounter: Payer: Self-pay | Admitting: Oncology

## 2019-11-18 VITALS — BP 130/72 | HR 72 | Temp 96.8°F | Ht 71.0 in | Wt 162.0 lb

## 2019-11-18 DIAGNOSIS — C3412 Malignant neoplasm of upper lobe, left bronchus or lung: Secondary | ICD-10-CM | POA: Diagnosis present

## 2019-11-18 DIAGNOSIS — C782 Secondary malignant neoplasm of pleura: Secondary | ICD-10-CM | POA: Diagnosis not present

## 2019-11-18 DIAGNOSIS — Z87891 Personal history of nicotine dependence: Secondary | ICD-10-CM | POA: Insufficient documentation

## 2019-11-18 DIAGNOSIS — R918 Other nonspecific abnormal finding of lung field: Secondary | ICD-10-CM

## 2019-11-18 DIAGNOSIS — R5383 Other fatigue: Secondary | ICD-10-CM | POA: Insufficient documentation

## 2019-11-18 LAB — GLUCOSE, CAPILLARY: Glucose-Capillary: 104 mg/dL — ABNORMAL HIGH (ref 70–99)

## 2019-11-18 IMAGING — CT NM PET TUM IMG INITIAL (PI) SKULL BASE T - THIGH
1 of 10 series · 1 of 25 positions shown · non-contrast
Comparison: [DATE] chest CT angiogram.

CLINICAL DATA: Initial treatment strategy for left apical lung
mass.

EXAM:
NUCLEAR MEDICINE PET SKULL BASE TO THIGH
TECHNIQUE: 9.0 mCi F-18 FDG was injected intravenously. Full-ring PET imaging
was performed from the skull base to thigh after the radiotracer. CT
data was obtained and used for attenuation correction and anatomic
localization.
Fasting blood glucose: 104 mg/dl

[Series 3: ct wb 5.0 b30f · axial · 5.0mm · 0.98mm/px · 1 of 329 slices shown]
[im 329/329  brain]
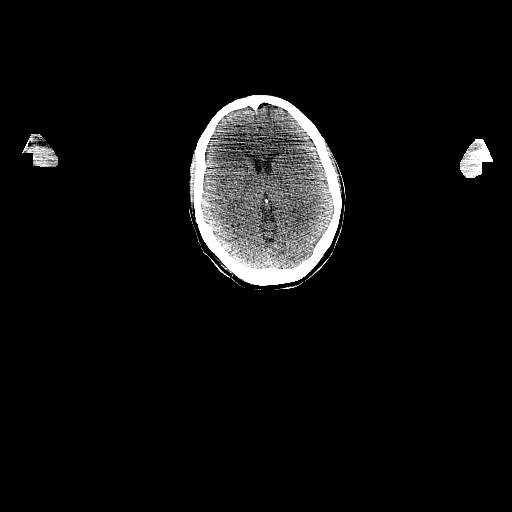

[1 of 25 positions shown; findings below may reference images not displayed]

FINDINGS: Mediastinal blood pool activity: SUV max

Liver activity: SUV max NA

NECK: No hypermetabolic lymph nodes in the neck.

Incidental CT findings: none

CHEST:

Spiculated solid 3.5 cm apical left upper lobe lung mass with max
SUV 12.9 (series 3/image 79). Adjacent plaque-like hypermetabolic
3.2 x 1.2 cm mass centered in the posterior medial apical left
pleural space with max SUV 13.6 (series 3/image 73), with slight
scalloping of the adjacent posterior left third rib.

Hypermetabolic infiltrative solid 7.3 x 3.6 cm left upper perihilar
mass with max SUV 19.2 (series 3/image 93), extending into the AP
window and left hilum.

Hypermetabolic 0.8 cm high left mediastinal node between the left
brachiocephalic vein and left subclavian artery with max SUV
(series 3/image 74).

No enlarged or hypermetabolic contralateral mediastinal or
contralateral hilar lymph nodes. No enlarged or hypermetabolic
axillary lymph nodes.

Incidental CT findings: Anterior right lower lobe 3 mm pulmonary
nodule (series 3/image 90), below PET resolution.

ABDOMEN/PELVIS: No abnormal hypermetabolic activity within the
liver, pancreas, adrenal glands, or spleen. No hypermetabolic lymph
nodes in the abdomen or pelvis.

Incidental CT findings: Minimally atherosclerotic nonaneurysmal
abdominal aorta.

SKELETON: No focal hypermetabolic activity to suggest skeletal
metastasis.

Incidental CT findings: none
IMPRESSION: 1. Hypermetabolic spiculated 3.5 cm apical left upper lobe lung
mass, compatible with primary bronchogenic carcinoma.
2. Adjacent hypermetabolic 3.2 x 1.2 cm pleural metastasis in the
medial posterior apical left pleural space with scalloping of the
adjacent posterior left third rib.
3. Hypermetabolic infiltrative upper left perihilar conglomerate
nodal metastasis extending into the AP window and left hilum.
4. Hypermetabolic ipsilateral high left mediastinal nodal
metastasis. No hypermetabolic contralateral mediastinal or
contralateral hilar metastases.
5. Tiny 3 mm right lower lobe pulmonary nodule, below PET
resolution, recommend attention on follow-up chest CT in 3 months.
6. No hypermetabolic extrathoracic or osseous metastases.
7.  Aortic Atherosclerosis ([VD]-[VD]).

## 2019-11-18 MED ORDER — FLUDEOXYGLUCOSE F - 18 (FDG) INJECTION
9.0100 | Freq: Once | INTRAVENOUS | Status: AC | PRN
  Administered 2019-11-18: 9.01 via INTRAVENOUS

## 2019-11-18 NOTE — Telephone Encounter (Signed)
Pt is aware of date/time of covid test and pre admit testing.  Nothing further is needed.

## 2019-11-18 NOTE — Telephone Encounter (Signed)
Called [employer] to check on PA on procedure code [id number] and spoke with Lemon at 2:19 pm EST.  [id number] has been approved. Authorization # [id number] has been approved from 11/22/2019 - 02/20/2020. No PA required for procedure code [phone] and [id number] Call Ref # [id number]. Nothing else needed at this time. [name]

## 2019-11-18 NOTE — Patient Instructions (Signed)
Your procedure is scheduled on: 11/22/19 Friday Report to [facility]. To find out your arrival time please call [phone] between 1PM - 3PM on 11/20/19 Wednesday.  Remember: Instructions that are not followed completely may result in serious medical risk, up to and including death, or upon the discretion of your surgeon and anesthesiologist your surgery may need to be rescheduled.     _X__ 1. Do not eat food after midnight the night before your procedure.                 No gum chewing or hard candies. You may drink clear liquids up to 2 hours                 before you are scheduled to arrive for your surgery- DO not drink clear                 liquids within 2 hours of the start of your surgery.                 Clear Liquids include:  water, apple juice without pulp, clear carbohydrate                 drink such as Clearfast or Gatorade, Black Coffee or Tea (Do not add                 anything to coffee or tea). Diabetics water only  __X__2.  On the morning of surgery brush your teeth with toothpaste and water, you                 may rinse your mouth with mouthwash if you wish.  Do not swallow any              toothpaste of mouthwash.     _X__ 3.  No Alcohol for 24 hours before or after surgery.   _X__ 4.  Do Not Smoke or use e-cigarettes For 24 Hours Prior to Your Surgery.                 Do not use any chewable tobacco products for at least 6 hours prior to                 surgery.  ____  5.  Bring all medications with you on the day of surgery if instructed.   __X__  6.  Notify your doctor if there is any change in your medical condition      (cold, fever, infections).     Do not wear jewelry, make-up, hairpins, clips or nail polish. Do not wear lotions, powders, or perfumes.  Do not shave 48 hours prior to surgery. Men may shave face and neck. Do not bring valuables to the hospital.    [facility] is not responsible  for any belongings or valuables.  Contacts, dentures/partials or body piercings may not be worn into surgery. Bring a case for your contacts, glasses or hearing aids, a denture cup will be supplied. Leave your suitcase in the car. After surgery it may be brought to your room. For patients admitted to the hospital, discharge time is determined by your treatment team.   Patients discharged the day of surgery will not be allowed to drive home.   Please read over the following fact sheets that you were given:   MRSA Information  __X__ Take these medicines the morning of surgery with A SIP OF WATER:  1. none  2.   3.   4.  5.  6.  ____ Fleet Enema (as directed)   ____ Use CHG Soap/SAGE wipes as directed  ____ Use inhalers on the day of surgery  ____ Stop metformin/Janumet/Farxiga 2 days prior to surgery    ____ Take 1/2 of usual insulin dose the night before surgery. No insulin the morning          of surgery.   ____ Stop Blood Thinners Coumadin/Plavix/Xarelto/Pleta/Pradaxa/Eliquis/Effient/Aspirin  on   Or contact your Surgeon, Cardiologist or Medical Doctor regarding  ability to stop your blood thinners  __X__ Stop Anti-inflammatories 7 days before surgery such as Advil, Ibuprofen, Motrin,  BC or Goodies Powder, Naprosyn, Naproxen, Aleve, Aspirin    __X__ Stop all herbal supplements, fish oil or vitamin E until after surgery.    ____ Bring C-Pap to the hospital.

## 2019-11-18 NOTE — Progress Notes (Signed)
Patient is here today to establish care for his lung mass.

## 2019-11-19 ENCOUNTER — Other Ambulatory Visit
Admission: RE | Admit: 2019-11-19 | Discharge: 2019-11-19 | Disposition: A | Discharge status: Home / Self Care | Payer: 59 | Source: Ambulatory Visit | Attending: Pulmonary Disease | Admitting: Pulmonary Disease

## 2019-11-19 DIAGNOSIS — Z01812 Encounter for preprocedural laboratory examination: Secondary | ICD-10-CM | POA: Diagnosis present

## 2019-11-19 DIAGNOSIS — Z20828 Contact with and (suspected) exposure to other viral communicable diseases: Secondary | ICD-10-CM | POA: Diagnosis not present

## 2019-11-19 LAB — SARS CORONAVIRUS 2 (TAT 6-24 HRS): SARS Coronavirus 2: NEGATIVE

## 2019-11-19 NOTE — Progress Notes (Signed)
Hematology/Oncology Consult note [facility] [phone] [phone]  Patient Care Team: Patient, No Pcp Per as PCP - General ([place]) Telford Nab, RN as Registered Nurse   Name of the patient: Kyle Guerrero  [id number]  February 13, 1971    Reason for referral-lung mass and mediastinal adenopathy   Referring physician-Dr. Marjean Donna   Date of visit: 11/19/19   History of presenting illness-  patient is a 48 year old male who presented to the ER with symptoms of heaviness in his chest and upper left chest discomfort he underwent CT angio chest to rule out PE which showed 3.8 x 3.3 cm left upper palpable lung mass along with 4.4 x 3.3 cm lobulated mass in the aortopulmonary window and 2.6 x 2.4 cm left hilar mass all concerning for malignancy.  Patient has also seen pulmonary and has been set up for bronchoscopy and EBUS guided biopsy on 11/23/2019.  Patient underwent.  PET CT scan which showed a hypermetabolic spiculated 3.5 cm of 5 left upper lobe lung mass, adjacent hypermetabolic 3.2 x 1.2 cm pleural metastases in the medial posterior by the left pleural space along with scalloping of the adjacent posterior left third rib.  Hypermetabolic infiltrative left perihilar conglomerate nodal metastases measuring up to 7.3 x 3.6 cm and 0.8 cm high left mediastinal node between the left brachiocephalic vein and left subclavian artery.  No evidence of distant metastatic disease  Currently patient reports some fatigue and heaviness in his chest but denies overt shortness of breath.  He is active and works 40 hours a week.  He is independent of his ADLs and IADLs.  ECOG PS- 0  Pain scale- 0   Review of systems- Review of Systems  Constitutional: Positive for malaise/fatigue. Negative for chills, fever and weight loss.  HENT: Negative for congestion, ear discharge and nosebleeds.   Eyes: Negative for blurred vision.  Respiratory: Negative  for cough, hemoptysis, sputum production, shortness of breath and wheezing.   Cardiovascular: Negative for chest pain, palpitations, orthopnea and claudication.  Gastrointestinal: Negative for abdominal pain, blood in stool, constipation, diarrhea, heartburn, melena, nausea and vomiting.  Genitourinary: Negative for dysuria, flank pain, frequency, hematuria and urgency.  Musculoskeletal: Negative for back pain, joint pain and myalgias.  Skin: Negative for rash.  Neurological: Negative for dizziness, tingling, focal weakness, seizures, weakness and headaches.  Endo/Heme/Allergies: Does not bruise/bleed easily.  Psychiatric/Behavioral: Negative for depression and suicidal ideas. The patient does not have insomnia.     No Known Allergies  There are no active problems to display for this patient.    History reviewed. No pertinent past medical history.   Past Surgical History:  Procedure Laterality Date  . CYST EXCISION      Social History   Socioeconomic History  . Marital status: Single    Spouse name: Not on file  . Number of children: Not on file  . Years of education: Not on file  . Highest education level: Not on file  Occupational History  . Not on file  Social Needs  . Financial resource strain: Not on file  . Food insecurity    Worry: Not on file    Inability: Not on file  . Transportation needs    Medical: Not on file    Non-medical: Not on file  Tobacco Use  . Smoking status: Former Smoker    Packs/day: 2.00    Types: Cigarettes    Quit date: 11/08/2019    Years since quitting: 0.0  .  Smokeless tobacco: Never Used  Substance and Sexual Activity  . Alcohol use: Yes  . Drug use: Yes    Types: Marijuana  . Sexual activity: Not on file  Lifestyle  . Physical activity    Days per week: Not on file    Minutes per session: Not on file  . Stress: Not on file  Relationships  . Social Herbalist on phone: Not on file    Gets together: Not on file     Attends religious service: Not on file    Active member of club or organization: Not on file    Attends meetings of clubs or organizations: Not on file    Relationship status: Not on file  . Intimate partner violence    Fear of current or ex partner: Not on file    Emotionally abused: Not on file    Physically abused: Not on file    Forced sexual activity: Not on file  Other Topics Concern  . Not on file  Social History Narrative  . Not on file     Family History  Problem Relation Age of Onset  . Healthy Mother   . Healthy Father      Current Outpatient Medications:  .  ibuprofen (ADVIL,MOTRIN) 800 MG tablet, Take 1 tablet (800 mg total) by mouth every 8 (eight) hours as needed., Disp: 30 tablet, Rfl: 0   Physical exam:  Vitals:   11/18/19 1510  BP: 130/72  Pulse: 72  Temp: (!) 96.8 F (36 C)  TempSrc: Tympanic  SpO2: 100%  Weight: 162 lb (73.5 kg)  Height: 5\' 11"  (1.803 m)   Physical Exam HENT:     Head: Normocephalic and atraumatic.  Eyes:     Pupils: Pupils are equal, round, and reactive to light.  Neck:     Musculoskeletal: Normal range of motion.  Cardiovascular:     Rate and Rhythm: Normal rate and regular rhythm.     Heart sounds: Normal heart sounds.  Pulmonary:     Effort: Pulmonary effort is normal.     Breath sounds: Normal breath sounds.  Abdominal:     General: Bowel sounds are normal.     Palpations: Abdomen is soft.  Lymphadenopathy:     Comments: No palpable cervical, supraclavicular, axillary or inguinal adenopathy   Skin:    General: Skin is warm and dry.  Neurological:     Mental Status: He is alert and oriented to person, place, and time.        CMP Latest Ref Rng & Units 11/04/2019  Glucose 70 - 99 mg/dL 126(H)  BUN 6 - 20 mg/dL 9  Creatinine 0.61 - 1.24 mg/dL 0.93  Sodium 135 - 145 mmol/L 138  Potassium 3.5 - 5.1 mmol/L 3.8  Chloride 98 - 111 mmol/L 103  CO2 22 - 32 mmol/L 26  Calcium 8.9 - 10.3 mg/dL 8.7(L)   CBC  Latest Ref Rng & Units 11/04/2019  WBC 4.0 - 10.5 K/uL 5.7  Hemoglobin 13.0 - 17.0 g/dL 12.5(L)  Hematocrit 39.0 - 52.0 % 37.8(L)  Platelets 150 - 400 K/uL 533(H)    No images are attached to the encounter.  Dg Chest 2 View  Result Date: 11/04/2019 CLINICAL DATA:  Chest pain. Additional history provided: Chest pain for 1 month. EXAM: CHEST - 2 VIEW COMPARISON:  Chest radiograph 12/17/2015 FINDINGS: Heart size within normal limits. There is increased density within the posterior left lung apex likely reflecting a mass and  highly suspicious for bronchogenic carcinoma. The lungs are otherwise clear. No evidence of pleural effusion or pneumothorax. No acute bony abnormality. These results were called by telephone at the time of interpretation on 11/04/2019 at 11:05 am to provider [facility], who verbally acknowledged these results. IMPRESSION: Increased density within the posterior left lung apex likely reflecting a mass and highly suspicious for bronchogenic carcinoma. Chest CT with intravenous contrast recommended for further evaluation. Electronically Signed   By: Kellie Simmering DO   On: 11/04/2019 11:06   Ct Angio Chest Pe W And/or Wo Contrast  Result Date: 11/04/2019 CLINICAL DATA:  Chest pain. EXAM: CT ANGIOGRAPHY CHEST WITH CONTRAST TECHNIQUE: Multidetector CT imaging of the chest was performed using the standard protocol during bolus administration of intravenous contrast. Multiplanar CT image reconstructions and MIPs were obtained to evaluate the vascular anatomy. CONTRAST:  50mL OMNIPAQUE IOHEXOL 350 MG/ML SOLN COMPARISON:  Radiograph of same day. FINDINGS: Cardiovascular: Satisfactory opacification of the pulmonary arteries to the segmental level. No evidence of pulmonary embolism. Normal heart size. No pericardial effusion. However, there is significant extrinsic compression of the left pulmonary artery secondary to surrounding adenopathy. Mediastinum/Nodes: 4.4 x 3.3 cm lobulated mass is noted in  aortopulmonary window. 2.6 x 2.4 cm left hilar mass is noted. Thyroid gland is unremarkable. The esophagus appears normal. Lungs/Pleura: No pneumothorax or pleural effusion is noted. Right lung is clear. 3.8 x 3.3 cm mass is noted in left lung apex concerning for malignancy. Upper Abdomen: No acute abnormality. Musculoskeletal: No chest wall abnormality. No acute or significant osseous findings. Review of the MIP images confirms the above findings. IMPRESSION: No definite evidence of pulmonary embolus. 3.8 x 3.3 cm left apical mass is noted concerning for malignancy. Enlarged aortopulmonary window and left hilar masses or adenopathy is noted consistent with metastatic disease. PET scan is recommended for further evaluation. Electronically Signed   By: Marijo Conception M.D.   On: 11/04/2019 13:22   Nm Pet Image Initial (pi) Skull Base To Thigh  Result Date: 11/18/2019 CLINICAL DATA:  Initial treatment strategy for left apical lung mass. EXAM: NUCLEAR MEDICINE PET SKULL BASE TO THIGH TECHNIQUE: 9.0 mCi F-18 FDG was injected intravenously. Full-ring PET imaging was performed from the skull base to thigh after the radiotracer. CT data was obtained and used for attenuation correction and anatomic localization. Fasting blood glucose: 104 mg/dl COMPARISON:  11/04/2019 chest CT angiogram. FINDINGS: Mediastinal blood pool activity: SUV max 1.9 Liver activity: SUV max NA NECK: No hypermetabolic lymph nodes in the neck. Incidental CT findings: none CHEST: Spiculated solid 3.5 cm apical left upper lobe lung mass with max SUV 12.9 (series 3/image 79). Adjacent plaque-like hypermetabolic 3.2 x 1.2 cm mass centered in the posterior medial apical left pleural space with max SUV 13.6 (series 3/image 73), with slight scalloping of the adjacent posterior left third rib. Hypermetabolic infiltrative solid 7.3 x 3.6 cm left upper perihilar mass with max SUV 19.2 (series 3/image 93), extending into the AP window and left hilum.  Hypermetabolic 0.8 cm high left mediastinal node between the left brachiocephalic vein and left subclavian artery with max SUV 12.7 (series 3/image 74). No enlarged or hypermetabolic contralateral mediastinal or contralateral hilar lymph nodes. No enlarged or hypermetabolic axillary lymph nodes. Incidental CT findings: Anterior right lower lobe 3 mm pulmonary nodule (series 3/image 90), below PET resolution. ABDOMEN/PELVIS: No abnormal hypermetabolic activity within the liver, pancreas, adrenal glands, or spleen. No hypermetabolic lymph nodes in the abdomen or pelvis. Incidental CT findings:  Minimally atherosclerotic nonaneurysmal abdominal aorta. SKELETON: No focal hypermetabolic activity to suggest skeletal metastasis. Incidental CT findings: none IMPRESSION: 1. Hypermetabolic spiculated 3.5 cm apical left upper lobe lung mass, compatible with primary bronchogenic carcinoma. 2. Adjacent hypermetabolic 3.2 x 1.2 cm pleural metastasis in the medial posterior apical left pleural space with scalloping of the adjacent posterior left third rib. 3. Hypermetabolic infiltrative upper left perihilar conglomerate nodal metastasis extending into the AP window and left hilum. 4. Hypermetabolic ipsilateral high left mediastinal nodal metastasis. No hypermetabolic contralateral mediastinal or contralateral hilar metastases. 5. Tiny 3 mm right lower lobe pulmonary nodule, below PET resolution, recommend attention on follow-up chest CT in 3 months. 6. No hypermetabolic extrathoracic or osseous metastases. 7.  Aortic Atherosclerosis (ICD10-I70.0). Electronically Signed   By: Ilona Sorrel M.D.   On: 11/18/2019 15:01    Assessment and plan- Patient is a 48 y.o. male presenting with a left apical lung mass along with adjacent pleural based mass and bulky mediastinal and hilar adenopathy concerning for lung cancer  I have reviewed PET/CT scan images independently and discussed findings with the patient.  Overall findings of the  left apical lung mass along with the adjacent pleural mass and mediastinal/hilar adenopathy concerning for Primary bronchogenic carcinoma.  At this time I will await EBUS guided biopsy for tissue diagnosis.  Also recommend getting MRI brain with and without contrast to complete his staging work-up.  If this is lung cancer given the fact that he has pleural-based mass Of 3.3 cm this would be concerning for M1 a stage IV disease.  However given that this is all locally advanced and given young age of the patient I am inclined to treat this aggressively with concurrent chemoradiation.  I will discuss this in more detail with him after the biopsy results are back.  I will also add him for our tumor board Next week.   Thank you for this kind referral and the opportunity to participate in the care of this patient   Visit Diagnosis 1. Lung mass     Dr. Randa Evens, MD, MPH [facility] [phone] 11/19/2019 1:32 PM

## 2019-11-19 NOTE — Progress Notes (Signed)
  Oncology Nurse Navigator Documentation  Navigator Location: [facility]-Med Onc (11/19/19 0800)   )Navigator Encounter Type: Initial MedOnc (11/19/19 0800)                         Barriers/Navigation Needs: Coordination of Care;Financial Toxicity;Employed (11/19/19 0800)   Interventions: Coordination of Care;Disability/FMLA;Referrals (11/19/19 0800) Referrals: Social Work;Radiation Oncology (11/19/19 0800) Coordination of Care: Appts;Radiology (11/19/19 0800)        Acuity: Level 3-Moderate Needs (3-4 Barriers Identified) (11/19/19 0800)  met with patient during initial med-onc consultation with Dr. Janese Banks. PET scan results reviewed during visit. Next steps discussed to keep appt for bronchoscopy on Friday 11/27. All questions answered during visit. Pt voiced concerns regarding being able to work during treatments in order to keep insurance coverage and be able to pay for bills/living expenses. Discussed with patient that we can help him apply for financial assistance if needed and also will refer him to Barnabas Lister, SW to further discuss need for disability and how we are able to help with bills if needed. Reviewed upcoming appts with patient. Contact info given and instructed to call with any further questions or needs. Pt verbalized understanding. Nothing further needed at this time.      Time Spent with Patient: 60 (11/19/19 0800)

## 2019-11-22 ENCOUNTER — Ambulatory Visit
Admission: RE | Admit: 2019-11-22 | Discharge: 2019-11-22 | Disposition: A | Discharge status: Home / Self Care | Payer: 59 | Attending: Pulmonary Disease | Admitting: Pulmonary Disease

## 2019-11-22 ENCOUNTER — Encounter: Payer: Self-pay | Admitting: Anesthesiology

## 2019-11-22 ENCOUNTER — Ambulatory Visit: Payer: 59 | Admitting: Anesthesiology

## 2019-11-22 ENCOUNTER — Other Ambulatory Visit: Payer: Self-pay

## 2019-11-22 ENCOUNTER — Ambulatory Visit: Payer: 59

## 2019-11-22 ENCOUNTER — Encounter
Admission: RE | Disposition: A | Discharge status: Home / Self Care | Payer: Self-pay | Source: Home / Self Care | Attending: Pulmonary Disease

## 2019-11-22 DIAGNOSIS — C3412 Malignant neoplasm of upper lobe, left bronchus or lung: Secondary | ICD-10-CM | POA: Diagnosis not present

## 2019-11-22 DIAGNOSIS — Z9889 Other specified postprocedural states: Secondary | ICD-10-CM

## 2019-11-22 DIAGNOSIS — J189 Pneumonia, unspecified organism: Secondary | ICD-10-CM | POA: Insufficient documentation

## 2019-11-22 DIAGNOSIS — Z87891 Personal history of nicotine dependence: Secondary | ICD-10-CM | POA: Insufficient documentation

## 2019-11-22 DIAGNOSIS — R918 Other nonspecific abnormal finding of lung field: Secondary | ICD-10-CM | POA: Diagnosis present

## 2019-11-22 DIAGNOSIS — R590 Localized enlarged lymph nodes: Secondary | ICD-10-CM | POA: Diagnosis present

## 2019-11-22 HISTORY — PX: VIDEO BRONCHOSCOPY WITH ENDOBRONCHIAL ULTRASOUND: SHX6177

## 2019-11-22 LAB — URINE DRUG SCREEN, QUALITATIVE (ARMC ONLY)
Amphetamines, Ur Screen: NOT DETECTED
Barbiturates, Ur Screen: NOT DETECTED
Benzodiazepine, Ur Scrn: NOT DETECTED
Cannabinoid 50 Ng, Ur ~~LOC~~: POSITIVE — AB
Cocaine Metabolite,Ur ~~LOC~~: NOT DETECTED
MDMA (Ecstasy)Ur Screen: NOT DETECTED
Methadone Scn, Ur: NOT DETECTED
Opiate, Ur Screen: NOT DETECTED
Phencyclidine (PCP) Ur S: NOT DETECTED
Tricyclic, Ur Screen: NOT DETECTED

## 2019-11-22 IMAGING — DX DG CHEST 1V PORT
1 series · 1 of 1 positions shown · non-contrast
Comparison: Portable exam [T4] hours compared to [DATE]

Correlation: CT angio chest [DATE], PET-CT [DATE]

CLINICAL DATA: Post LEFT lung biopsy

EXAM:
PORTABLE CHEST 1 VIEW

[chest ap]
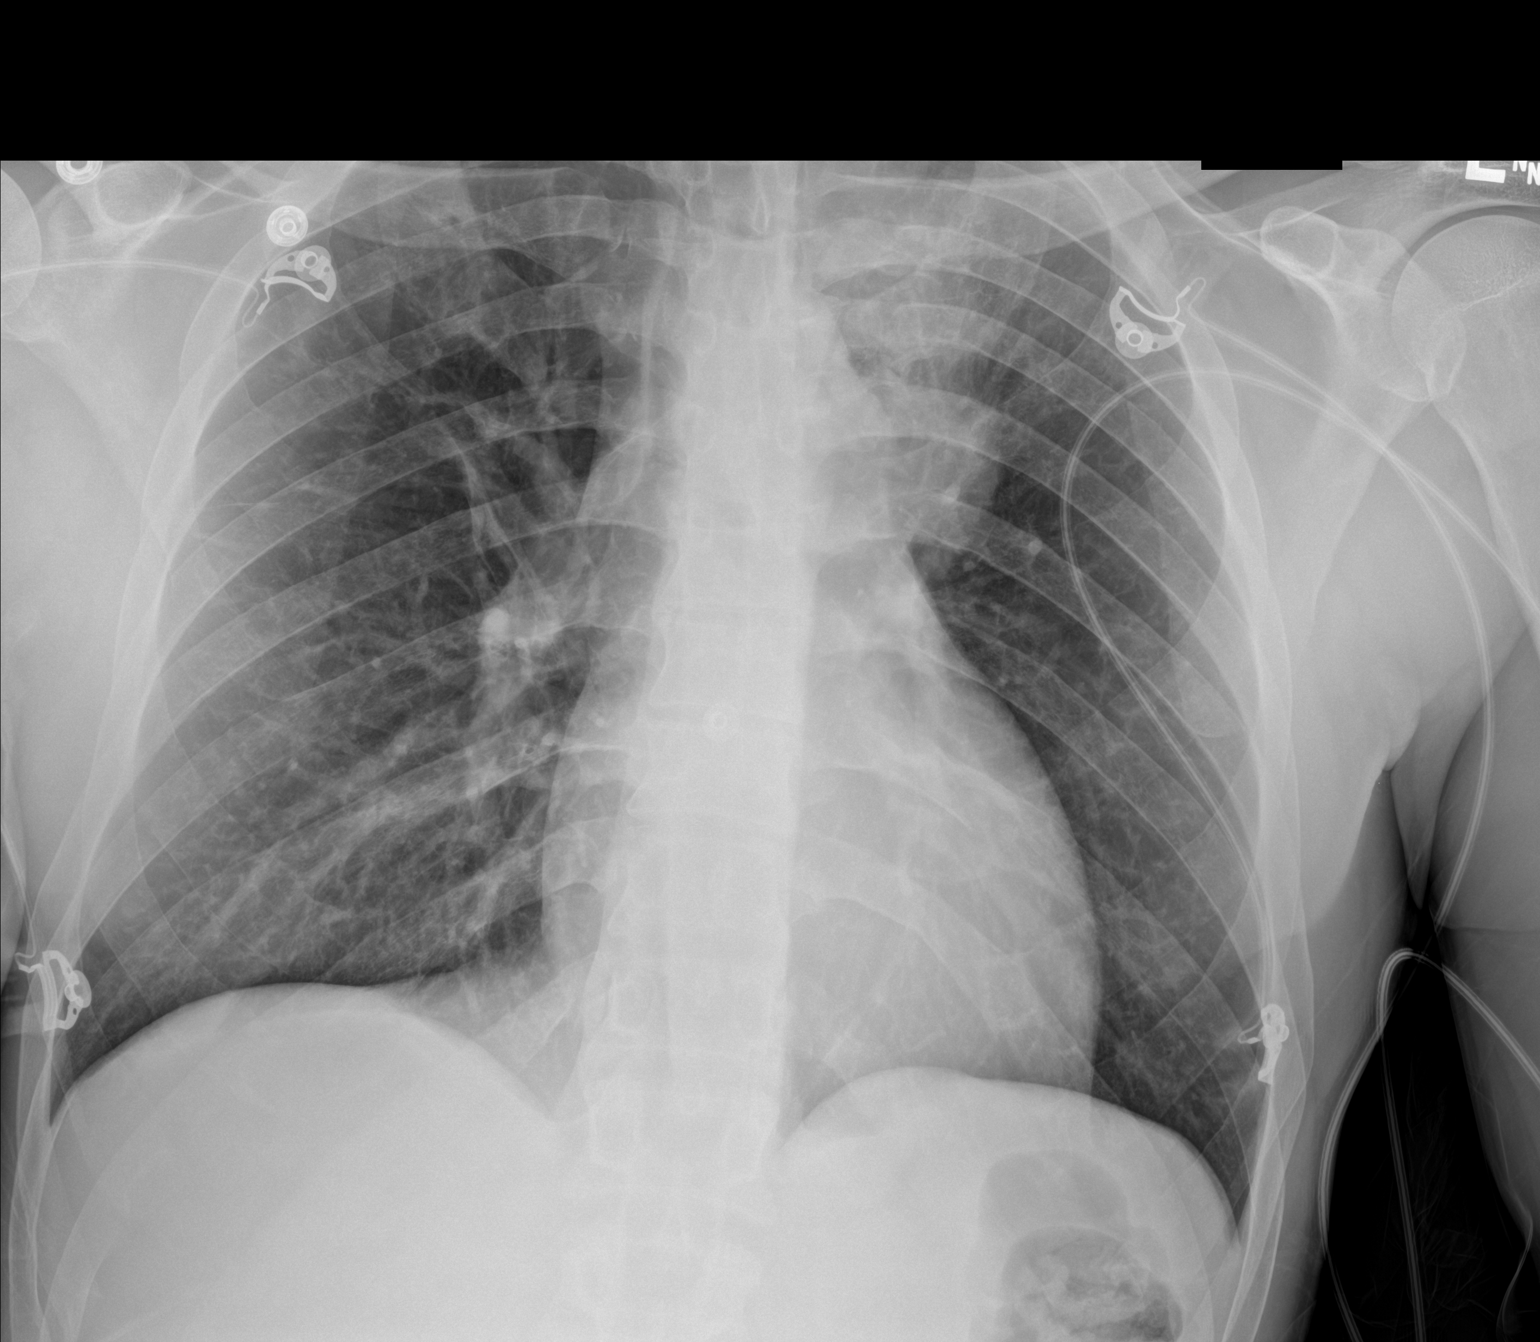

[1 of 1 positions shown; findings below may reference images not displayed]

FINDINGS: Normal heart size, mediastinal contours, and pulmonary vascularity.

LEFT hilar enlargement and superior retraction of LEFT hilum.

Linear subsegmental atelectasis RIGHT upper lobe.

Progressive opacity in the LEFT upper lobe especially medially
consistent with known suprahilar mass and slightly increased
postobstructive pneumonitis.

Remaining lungs clear.

No pleural effusion or pneumothorax.
IMPRESSION: Known LEFT upper lobe mass, LEFT hilar adenopathy, and increased
postobstructive pneumonitis in LEFT upper lobe.

Subsegmental atelectasis RIGHT upper lobe.

## 2019-11-22 SURGERY — BRONCHOSCOPY, WITH EBUS
Anesthesia: General | Laterality: Left

## 2019-11-22 SURGERY — ELECTROMAGNETIC NAVIGATION BRONCHOSCOPY
Anesthesia: General | Laterality: Left

## 2019-11-22 MED ORDER — PROPOFOL 10 MG/ML IV BOLUS
INTRAVENOUS | Status: AC
  Filled 2019-11-22: qty 20

## 2019-11-22 MED ORDER — METOPROLOL TARTRATE 5 MG/5ML IV SOLN
INTRAVENOUS | Status: DC | PRN
  Administered 2019-11-22: 2 mg via INTRAVENOUS
  Administered 2019-11-22: 1 mg via INTRAVENOUS

## 2019-11-22 MED ORDER — ROCURONIUM BROMIDE 100 MG/10ML IV SOLN
INTRAVENOUS | Status: DC | PRN
  Administered 2019-11-22: 10 mg via INTRAVENOUS
  Administered 2019-11-22: 40 mg via INTRAVENOUS
  Administered 2019-11-22: 10 mg via INTRAVENOUS

## 2019-11-22 MED ORDER — FENTANYL CITRATE (PF) 100 MCG/2ML IJ SOLN
INTRAMUSCULAR | Status: AC
  Filled 2019-11-22: qty 2

## 2019-11-22 MED ORDER — FAMOTIDINE 20 MG PO TABS
20.0000 mg | ORAL_TABLET | Freq: Once | ORAL | Status: AC
  Administered 2019-11-22: 13:00:00 20 mg via ORAL

## 2019-11-22 MED ORDER — FENTANYL CITRATE (PF) 100 MCG/2ML IJ SOLN
INTRAMUSCULAR | Status: DC | PRN
  Administered 2019-11-22: 50 ug via INTRAVENOUS

## 2019-11-22 MED ORDER — FENTANYL CITRATE (PF) 100 MCG/2ML IJ SOLN: 25.0000 ug | INTRAMUSCULAR | Status: DC | PRN

## 2019-11-22 MED ORDER — FAMOTIDINE 20 MG PO TABS
ORAL_TABLET | ORAL | Status: AC
  Filled 2019-11-22: qty 1

## 2019-11-22 MED ORDER — DEXAMETHASONE SODIUM PHOSPHATE 10 MG/ML IJ SOLN
INTRAMUSCULAR | Status: DC | PRN
  Administered 2019-11-22: 10 mg via INTRAVENOUS

## 2019-11-22 MED ORDER — ONDANSETRON HCL 4 MG/2ML IJ SOLN
INTRAMUSCULAR | Status: DC | PRN
  Administered 2019-11-22: 4 mg via INTRAVENOUS

## 2019-11-22 MED ORDER — LIDOCAINE HCL (CARDIAC) PF 100 MG/5ML IV SOSY
PREFILLED_SYRINGE | INTRAVENOUS | Status: DC | PRN
  Administered 2019-11-22 (×2): 50 mg via INTRAVENOUS

## 2019-11-22 MED ORDER — HYDROCODONE-ACETAMINOPHEN 5-325 MG PO TABS: 1.0000 | ORAL_TABLET | Freq: Four times a day (QID) | ORAL | 0 refills | Status: AC | PRN

## 2019-11-22 MED ORDER — PROPOFOL 10 MG/ML IV BOLUS
INTRAVENOUS | Status: DC | PRN
  Administered 2019-11-22: 150 mg via INTRAVENOUS

## 2019-11-22 MED ORDER — OXYCODONE HCL 5 MG/5ML PO SOLN: 5.0000 mg | Freq: Once | ORAL | Status: DC | PRN

## 2019-11-22 MED ORDER — MIDAZOLAM HCL 2 MG/2ML IJ SOLN
INTRAMUSCULAR | Status: DC | PRN
  Administered 2019-11-22: 2 mg via INTRAVENOUS

## 2019-11-22 MED ORDER — MIDAZOLAM HCL 2 MG/2ML IJ SOLN
INTRAMUSCULAR | Status: AC
  Filled 2019-11-22: qty 2

## 2019-11-22 MED ORDER — SUCCINYLCHOLINE CHLORIDE 20 MG/ML IJ SOLN
INTRAMUSCULAR | Status: DC | PRN
  Administered 2019-11-22: 120 mg via INTRAVENOUS

## 2019-11-22 MED ORDER — LACTATED RINGERS IV SOLN
INTRAVENOUS | Status: DC
  Administered 2019-11-22 (×2): via INTRAVENOUS

## 2019-11-22 MED ORDER — SUGAMMADEX SODIUM 200 MG/2ML IV SOLN
INTRAVENOUS | Status: DC | PRN
  Administered 2019-11-22: 147.8 mg via INTRAVENOUS

## 2019-11-22 MED ORDER — BUTAMBEN-TETRACAINE-BENZOCAINE 2-2-14 % EX AERO
1.0000 | INHALATION_SPRAY | Freq: Once | CUTANEOUS | Status: DC
  Filled 2019-11-22: qty 20

## 2019-11-22 MED ORDER — OXYCODONE HCL 5 MG PO TABS: 5.0000 mg | ORAL_TABLET | Freq: Once | ORAL | Status: DC | PRN

## 2019-11-22 NOTE — Interval H&P Note (Signed)
History and Physical Interval Note:  11/22/2019 12:32 PM  Kyle Guerrero  has presented today for surgery, with the diagnosis of LUNG MASS.  The various methods of treatment have been discussed with the patient and family. After consideration of risks, benefits and other options for treatment, the patient has consented to  Procedure(s): [place] (Left) as a surgical intervention.  The patient's history has been reviewed, patient examined, no change in status, stable for surgery.  I have reviewed the patient's chart and labs.  Questions were answered to the patient's satisfaction.     Vernard Gambles

## 2019-11-22 NOTE — Transfer of Care (Signed)
Immediate Anesthesia Transfer of Care Note  Patient: Kyle Guerrero  Procedure(s) Performed: VIDEO BRONCHOSCOPY WITH ENDOBRONCHIAL ULTRASOUND (Left )  Patient Location: PACU  Anesthesia Type:General  Level of Consciousness: sedated  Airway & Oxygen Therapy: Patient Spontanous Breathing and Patient connected to face mask oxygen  Post-op Assessment: Report given to RN and Post -op Vital signs reviewed and stable  Post vital signs: Reviewed and stable  Last Vitals:  Vitals Value Taken Time  BP 141/82 11/22/19 1404  Temp 35.8 C 11/22/19 1404  Pulse 91 11/22/19 1407  Resp 20 11/22/19 1407  SpO2 99 % 11/22/19 1407  Vitals shown include unvalidated device data.  Last Pain:  Vitals:   11/22/19 1404  TempSrc:   PainSc: Asleep         Complications: No apparent anesthesia complications

## 2019-11-22 NOTE — Anesthesia Preprocedure Evaluation (Signed)
Anesthesia Evaluation  Patient identified by MRN, date of birth, ID band Patient awake    Reviewed: Allergy & Precautions, H&P , NPO status , Patient's Chart, lab work & pertinent test results  History of Anesthesia Complications Negative for: history of anesthetic complications  Airway Mallampati: II  TM Distance: >3 FB Neck ROM: full    Dental  (+) Chipped, Poor Dentition   Pulmonary neg shortness of breath, former smoker,           Cardiovascular Exercise Tolerance: Good (-) angina(-) Past MI and (-) DOE negative cardio ROS       Neuro/Psych negative neurological ROS  negative psych ROS   GI/Hepatic negative GI ROS, Neg liver ROS, neg GERD  ,  Endo/Other  negative endocrine ROS  Renal/GU      Musculoskeletal   Abdominal   Peds  Hematology negative hematology ROS (+)   Anesthesia Other Findings  Past Surgical History: No date: CYST EXCISION  BMI    Body Mass Index: 22.73 kg/m      Reproductive/Obstetrics negative OB ROS                             Anesthesia Physical Anesthesia Plan  ASA: II  Anesthesia Plan: General ETT   Post-op Pain Management:    Induction: Intravenous  PONV Risk Score and Plan: Ondansetron, Dexamethasone, Midazolam and Treatment may vary due to age or medical condition  Airway Management Planned: Oral ETT  Additional Equipment:   Intra-op Plan:   Post-operative Plan: Extubation in OR  Informed Consent: I have reviewed the patients History and Physical, chart, labs and discussed the procedure including the risks, benefits and alternatives for the proposed anesthesia with the patient or authorized representative who has indicated his/her understanding and acceptance.     Dental Advisory Given  Plan Discussed with: Anesthesiologist, CRNA and Surgeon  Anesthesia Plan Comments: (Patient consented for risks of anesthesia including but not  limited to:  - adverse reactions to medications - damage to teeth, lips or other oral mucosa - sore throat or hoarseness - Damage to heart, brain, lungs or loss of life  Patient voiced understanding.)        Anesthesia Quick Evaluation

## 2019-11-22 NOTE — Anesthesia Procedure Notes (Signed)
Procedure Name: Intubation Date/Time: 11/22/2019 1:08 PM Performed by: Nelda Marseille, CRNA Pre-anesthesia Checklist: Patient identified, Patient being monitored, Timeout performed, Emergency Drugs available and Suction available Patient Re-evaluated:Patient Re-evaluated prior to induction Oxygen Delivery Method: Circle system utilized Preoxygenation: Pre-oxygenation with 100% oxygen Induction Type: IV induction Ventilation: Mask ventilation without difficulty Laryngoscope Size: Mac, 3 and Glidescope Grade View: Grade II Tube type: Oral Tube size: 9.0 mm Number of attempts: 1 Airway Equipment and Method: Stylet Placement Confirmation: ETT inserted through vocal cords under direct vision,  positive ETCO2 and breath sounds checked- equal and bilateral Secured at: 23 cm Tube secured with: Tape Dental Injury: Teeth and Oropharynx as per pre-operative assessment

## 2019-11-22 NOTE — Op Note (Addendum)
PROCEDURE(S):  1) BRONCHOSCOPY 2) ENDOBRONCHIAL ULTRASOUND   PROCEDURE DATE: 11/22/2019  TIME:  NAME:  Kyle Guerrero  DOB:1971/07/07  MRN: [id number] LOC:  ARPO/None    HOSP DAY: _0 @ CODE STATUS:    FULL  Indications/Preliminary Diagnosis: Left upper lung mass Hilar/mediastinal adenopathy  Consent: (Place X beside choice/s below)  The benefits, risks and possible complications of the procedure were        explained to:  _X__ patient  ___ patient's family  ___ other:___________  who verbalized understanding and gave:  ___ verbal  ___ written  _X__ verbal and written  ___ telephone  ___ other:________ consent.      Unable to obtain consent; procedure performed on emergent basis.     Other:    Benefits, limitations and potential complications of the procedure were discussed with the patient/family  including, but not limited to bleeding, hemoptysis, respiratory failure requiring intubation and/or prolongued mechanical ventilation, infection, pneumothorax (collapse of lung) requiring chest tube placement, stroke from air embolism or even death.  Patient agreed to proceed.  Initial plan was to do navigational bronchoscopy and endobronchial ultrasound.  The patient's CT scan available would not offer an acceptable plan for navigation.  Therefore the procedure would be done under fluoroscopy and endobronchial ultrasound will be performed as well.  The patient understood this change.  ANESTHESIA TYPE: General endotracheal anesthesia  Operator: Renold Don, MD Assistant/Scrub: Harriet Pho, RRT CRNA: Nelda Marseille, CRNA   Insertion Route (Place X beside choice below)   Nasal   Oral  X Endotracheal Tube   Tracheostomy    PROCEDURE DETAILS: Patient was taken to Procedure Room 2 (Bronchoscopy Suite) in the OR area.  Timeout performed and correct patient, name, laterality & ID number confirmed.  The patient was inducted under general anesthesia by  CRNA/anesthesiologist.  Patient was intubated with a #9 ET tube without difficulty.  A Portex adapter was placed on the flange of the ET tube.  Once the patient had achieved adequate general anesthesia the Olympus video therapeutic bronchoscope was advanced via the Portex adapter.  The airway was inspected in its entirety.  The visible portions of the trachea were normal.  Carina was sharp.  The right tracheobronchial tree was examined, no endobronchial lesions were noted.  Attention was then brought to the left tracheobronchial tree.  Lower lobe subsegments of the left lung were normal and without endobronchial lesions.  Inspection of the left upper lobe and lingula subsegments showed that there was fullness at the secondary carina of the left upper lobe but no distinct mass.  There was narrowing of the apical posterior subsegment of the left upper lobe the opening was friable.  Tools could not be passed easily in this area.  Brushings were performed with fluoroscopic guidance.  Initial brushings only showed inflammation.  Reorientation of the tools under fluoroscopic guidance showed that the approach entered the narrowed portion of the left upper lobe apical posterior subsegment.  Under fluoroscopy it could be seen that the tool did advance before meeting resistance.  Microscopic ROSE showed malignant cells.  An additional 3 brushings after this finding were performed in this area.  Once this was done transbronchial left upper lobe biopsies were performed.  These were done under fluoroscopic guidance.  Prior to exchanging the bronchoscope was left upper lobe bronchoalveolar lavage was performed with 40 mL of saline instilled yielding approximately 20 mL of aliquot for cytology.  The bronchoscope was then replaced for an endobronchial ultrasound scope.  The mediastinum was examined a left hilar mass could be identified.  Attempts at biopsying the mass were not successful due to lack of proper trajectory when the  needle was passed.  Suspect due to the stiffness of the needle and getting the scope out of proper window.  Since a positive specimen had been obtained with the brushings we elected to abort this portion of the procedure (TBNA) to prevent potential complication for the patient.  At this time the airway was inspected again, the patient received lidocaine 2% via bronchial lavage (total of 6 mL) and the bronchoscope was retrieved.  Patient was taken to the PACU in satisfactory condition.  No overt complications were noted.  Patient tolerated the procedure well.  SPECIMENS (Sites): (Place X beside choice below)  Specimens Description   No Specimens Obtained     Washings   X Lavage LUL  12 ml aliquot  X Biopsies X6 fluoro guided   Fine Needle Aspirates   X Brushings X6 fluoro guided   Sputum    FINDINGS:  Narrowing of the left upper lobe subsegmental bronchus, apicoposterior, difficult to pass tools through the same.  Friable opening to this subsegmental bronchus Thickening of the secondary carina of the left upper lobe bronchus No distinct endobronchial lesion  ESTIMATED BLOOD LOSS: Less than 2 mL COMPLICATIONS/RESOLUTION: none.  Post procedure chest x-ray showed no pneumothorax.  IMPRESSION:POST-PROCEDURE DX:  1.  Left upper lobe mass with hilar involvement, initial cytology positive for carcinoma 2.  Hilar adenopathy/mass, unable to biopsy due to inability to obtain safe trajectory for the needle  RECOMMENDATION/PLAN:  1.  Patient's wife was apprised of the findings as per his request 2.  Per patient's wife he has had some issues with chest pain (presenting symptom) he was prescribed Norco 5/325 3.  Follow-up pathology reports 4.  Patient has follow-up with oncology and with our office.  These are as noted on the follow-up section.    Renold Don, MD [facility] Advanced Bronchoscopy

## 2019-11-22 NOTE — Discharge Instructions (Signed)

## 2019-11-22 NOTE — Anesthesia Post-op Follow-up Note (Signed)
Anesthesia QCDR form completed.        

## 2019-11-22 NOTE — Anesthesia Postprocedure Evaluation (Signed)
Anesthesia Post Note  Patient: Kyle Guerrero  Procedure(s) Performed: VIDEO BRONCHOSCOPY WITH ENDOBRONCHIAL ULTRASOUND (Left )  Patient location during evaluation: PACU Anesthesia Type: General Level of consciousness: awake and alert Pain management: pain level controlled Vital Signs Assessment: post-procedure vital signs reviewed and stable Respiratory status: spontaneous breathing, nonlabored ventilation, respiratory function stable and patient connected to nasal cannula oxygen Cardiovascular status: blood pressure returned to baseline and stable Postop Assessment: no apparent nausea or vomiting Anesthetic complications: no     Last Vitals:  Vitals:   11/22/19 1419 11/22/19 1434  BP: (!) 139/93 138/87  Pulse: 87 89  Resp: (!) 22 15  Temp:  (!) 36.1 C  SpO2: 98% 93%    Last Pain:  Vitals:   11/22/19 1434  TempSrc:   PainSc: 0-No pain                 Precious Haws Kinga Cassar

## 2019-11-23 ENCOUNTER — Encounter: Payer: Self-pay | Admitting: Pulmonary Disease

## 2019-11-26 ENCOUNTER — Encounter: Payer: Self-pay | Admitting: Pulmonary Disease

## 2019-11-28 ENCOUNTER — Other Ambulatory Visit: Payer: Self-pay | Admitting: *Deleted

## 2019-11-28 ENCOUNTER — Telehealth: Payer: Self-pay | Admitting: *Deleted

## 2019-11-28 ENCOUNTER — Inpatient Hospital Stay: Payer: 59 | Attending: Oncology

## 2019-11-28 ENCOUNTER — Inpatient Hospital Stay: Payer: 59 | Admitting: Oncology

## 2019-11-28 DIAGNOSIS — R7989 Other specified abnormal findings of blood chemistry: Secondary | ICD-10-CM | POA: Insufficient documentation

## 2019-11-28 DIAGNOSIS — C7802 Secondary malignant neoplasm of left lung: Secondary | ICD-10-CM | POA: Insufficient documentation

## 2019-11-28 DIAGNOSIS — R000 Tachycardia, unspecified: Secondary | ICD-10-CM | POA: Insufficient documentation

## 2019-11-28 DIAGNOSIS — Z87891 Personal history of nicotine dependence: Secondary | ICD-10-CM | POA: Insufficient documentation

## 2019-11-28 DIAGNOSIS — C3412 Malignant neoplasm of upper lobe, left bronchus or lung: Secondary | ICD-10-CM | POA: Insufficient documentation

## 2019-11-28 DIAGNOSIS — Z7952 Long term (current) use of systemic steroids: Secondary | ICD-10-CM | POA: Insufficient documentation

## 2019-11-28 DIAGNOSIS — Z5111 Encounter for antineoplastic chemotherapy: Secondary | ICD-10-CM | POA: Insufficient documentation

## 2019-11-28 DIAGNOSIS — R5383 Other fatigue: Secondary | ICD-10-CM | POA: Insufficient documentation

## 2019-11-28 DIAGNOSIS — R918 Other nonspecific abnormal finding of lung field: Secondary | ICD-10-CM

## 2019-11-28 DIAGNOSIS — C771 Secondary and unspecified malignant neoplasm of intrathoracic lymph nodes: Secondary | ICD-10-CM | POA: Insufficient documentation

## 2019-11-28 LAB — CYTOLOGY - NON PAP

## 2019-11-28 LAB — SURGICAL PATHOLOGY

## 2019-11-28 NOTE — Progress Notes (Signed)
Tumor Board Documentation  ALPHONZO DEVERA was presented by Dr Janese Banks at our Tumor Board on 11/28/2019, which included representatives from medical oncology, radiation oncology, surgical, radiology, pathology, navigation, internal medicine, genetics, research, palliative care, pulmonology.  Demeco currently presents as a new patient, for new positive pathology, for [place] with history of the following treatments: active survellience, surgical intervention(s).  Additionally, we reviewed previous medical and familial history, history of present illness, and recent lab results along with all available histopathologic and imaging studies. The tumor board considered available treatment options and made the following recommendations: Biopsy, Immunotherapy, Chemotherapy CT Biopsy for more tissue for Omnisec testing, Consolidation XRT after chemo-immunotherapy  The following procedures/referrals were also placed: No orders of the defined types were placed in this encounter.   Clinical Trial Status: not discussed   Staging used: To be determined  AJCC Staging:       Group: Lung cancer   National site-specific guidelines   were discussed with respect to the case.  Tumor board is a meeting of clinicians from various specialty areas who evaluate and discuss patients for whom a multidisciplinary approach is being considered. Final determinations in the plan of care are those of the provider(s). The responsibility for follow up of recommendations given during tumor board is that of the provider.   Today's extended care, comprehensive team conference, Libero was not present for the discussion and was not examined.   Multidisciplinary Tumor Board is a multidisciplinary case peer review process.  Decisions discussed in the Multidisciplinary Tumor Board reflect the opinions of the specialists present at the conference without having examined the patient.  Ultimately, treatment and diagnostic decisions rest  with the primary provider(s) and the patient.

## 2019-11-28 NOTE — Telephone Encounter (Signed)
Message left with patient to inform that appt on 12/3 has been moved to Friday 12/4 at 2:45pm due to biopsy results not being available. Instructed pt to call back with any questions.

## 2019-11-29 ENCOUNTER — Ambulatory Visit (INDEPENDENT_AMBULATORY_CARE_PROVIDER_SITE_OTHER): Payer: 59 | Admitting: Pulmonary Disease

## 2019-11-29 ENCOUNTER — Telehealth: Payer: Self-pay | Admitting: Pulmonary Disease

## 2019-11-29 ENCOUNTER — Other Ambulatory Visit: Payer: Self-pay

## 2019-11-29 ENCOUNTER — Encounter: Payer: Self-pay | Admitting: *Deleted

## 2019-11-29 ENCOUNTER — Encounter: Payer: Self-pay | Admitting: Pulmonary Disease

## 2019-11-29 ENCOUNTER — Inpatient Hospital Stay (HOSPITAL_BASED_OUTPATIENT_CLINIC_OR_DEPARTMENT_OTHER): Payer: 59 | Admitting: Oncology

## 2019-11-29 DIAGNOSIS — R042 Hemoptysis: Secondary | ICD-10-CM

## 2019-11-29 DIAGNOSIS — C3492 Malignant neoplasm of unspecified part of left bronchus or lung: Secondary | ICD-10-CM | POA: Insufficient documentation

## 2019-11-29 DIAGNOSIS — Z7189 Other specified counseling: Secondary | ICD-10-CM | POA: Diagnosis not present

## 2019-11-29 DIAGNOSIS — C3412 Malignant neoplasm of upper lobe, left bronchus or lung: Secondary | ICD-10-CM

## 2019-11-29 MED ORDER — LIDOCAINE-PRILOCAINE 2.5-2.5 % EX CREA: TOPICAL_CREAM | CUTANEOUS | 3 refills | Status: DC

## 2019-11-29 MED ORDER — ONDANSETRON HCL 8 MG PO TABS: 8.0000 mg | ORAL_TABLET | Freq: Two times a day (BID) | ORAL | 1 refills | Status: DC | PRN

## 2019-11-29 MED ORDER — PROCHLORPERAZINE MALEATE 10 MG PO TABS: 10.0000 mg | ORAL_TABLET | Freq: Four times a day (QID) | ORAL | 1 refills | Status: DC | PRN

## 2019-11-29 MED ORDER — DEXAMETHASONE 4 MG PO TABS: 8.0000 mg | ORAL_TABLET | Freq: Every day | ORAL | 1 refills | Status: DC

## 2019-11-29 NOTE — Progress Notes (Signed)
Subjective:    Patient ID: Kyle Guerrero, male    DOB: April 07, 1971, 48 y.o.   MRN: [id number] Virtual Visit Via Video or Telephone Note:   This visit type was conducted due to national recommendations for restrictions regarding the COVID-19 pandemic .  This format is felt to be most appropriate for this patient at this time.  All issues noted in this document were discussed and addressed.  No physical exam was performed (except for noted visual exam findings with Video Visits).    I connected with Kyle Guerrero by  telephone at 11:20 hrs. and verified that I was speaking with the correct person using two identifiers. Location patient: home Location provider: [facility] Persons participating in the virtual visit: patient, physician   I discussed the limitations, risks, security and privacy concerns of performing an evaluation and management service by video and the availability of in person appointments. The patient expressed understanding and agreed to proceed.  HPI Patient is a 48 year old recent former smoker (November 2020) who follows for the issue of a LEFT lung mass and mediastinal mass/adenopathy.  The patient underwent bronchoscopy for diagnosis on 27 November.  The bronchus affected on the left upper lobe was so narrowed that only brushes could be passed with sufficient material for analysis.  All other tools (biopsy forcep/needles) were too stiff and would deviate from the target.  Initial ROSE determined that there was enough material for analysis.  It appeared that it was a non-small cell carcinoma likely adenocarcinoma.  It appears now that material was not sufficient for molecular studies which are necessary to better address the patient's issues.  Patient will likely need second biopsy for more material.  The patient since his initial evaluation and bronchoscopy has noted more shortness of breath and also has noted some hemoptysis.  These are new symptoms for  the patient.  Because of worsening symptoms he will need therapy to be started prior to molecular analysis.  Patient has had no fevers, chills or sweats.  Only the aforementioned shortness of breath and hemoptysis which at present is not massive hemoptysis per his description.  He has had some issues with anorexia as well.  He voices no other complaints.  His chest pain is controlled with Norco.   Review of Systems A 10 point review of systems was performed and it is as noted above otherwise negative.    Objective:   Physical Exam No physical exam was performed today due to this being a phone visit.  Patient was able to complete sentences without difficulty.  Speech was clear.     Assessment & Plan:   1.  Non-small cell carcinoma of the lung, LEFT: Cytologic material was consistent with adenocarcinoma likely.  However, molecular studies will need to be done.  Tissue was not clean enough for molecular studies and the patient will undergo a second biopsy which will be done by interventional radiology.  I also recommend liquid biopsy as this can help to determine the molecular profile of the carcinoma.  Ideally patient should have molecular studies done on both tissue AND via liquid biopsy to streamline chemotherapy.  The patient will also need radiation therapy due to symptoms of dyspnea and hemoptysis to have determined likely indicating further bronchial obstruction.  Patient has been referred to Radiation Oncology and will be seen on 7 December by Dr. Baruch Gouty.  2.  Hemoptysis: Again this is due to his non-small cell carcinoma of the lung, will refer to Radiation Oncology  as above.  I have discussed all the above with Dr. Randa Evens the patient's Medical Oncologist and with Telford Nab, RN Oncology Navigator.  We will see the patient in follow-up in 3 to 4 weeks time to reassess whether he may need spray cryotherapy and/or bronchial stenting.  Total time of visit: 15 minutes.   Renold Don, MD [facility]   This note was dictated using voice recognition software/Dragon.  Despite best efforts to proofread, errors can occur which can change the meaning.  Any change was purely unintentional.

## 2019-11-29 NOTE — Progress Notes (Signed)
  Oncology Nurse Navigator Documentation  Navigator Location: [facility] (11/29/19 1600)   )Navigator Encounter Type: Telephone (11/29/19 1600) Telephone: Appt Confirmation/Clarification;Clinic/[facility] Follow-up (11/29/19 1600)   Confirmed Diagnosis Date: 11/28/19 (11/29/19 1600)                 Treatment Phase: Pre-Tx/Tx Discussion (11/29/19 1600) Barriers/Navigation Needs: Coordination of Care (11/29/19 1600)   Interventions: Coordination of Care (11/29/19 1600) Referrals: Radiation Oncology (11/29/19 1600) Coordination of Care: Appts;Chemo (11/29/19 1600)         phone call made to patient to follow up virtual visit with Dr. Janese Banks today. All questions answered during phone call. Reviewed upcoming appts. Informed pt that will be working on coordinating port placement on the same day as biopsy with IR. Pt voiced need with FMLA/Disability assistance and pt informed that we will take care of any form that needs to be filled out by provider. Instructed pt to call back with any further questions or needs. Will follow up at next visit. Pt verbalized understanding. Nothing further needed at this time.          Time Spent with Patient: 45 (11/29/19 1600)

## 2019-11-29 NOTE — Patient Instructions (Addendum)
1.  You have been referred to radiation oncology, Dr. Baruch Gouty.  2.  You will need a repeat biopsy to get tissue for special molecular studies.  We will also try to do a blood test to corroborate this.  3.  Follow-up with [place] in this office in 3 to 4 weeks.  Call sooner if you have any further issues.

## 2019-11-29 NOTE — Patient Instructions (Signed)
Paclitaxel injection What is this medicine? PACLITAXEL (PAK li TAX el) is a chemotherapy drug. It targets fast dividing cells, like cancer cells, and causes these cells to die. This medicine is used to treat ovarian cancer, breast cancer, lung cancer, Kaposi's sarcoma, and other cancers. This medicine may be used for other purposes; ask your health care provider or pharmacist if you have questions. COMMON BRAND NAME(S): Onxol, Taxol What should I tell my health care provider before I take this medicine? They need to know if you have any of these conditions:  history of irregular heartbeat  liver disease  low blood counts, like low white cell, platelet, or red cell counts  lung or breathing disease, like asthma  tingling of the fingers or toes, or other nerve disorder  an unusual or allergic reaction to paclitaxel, alcohol, polyoxyethylated castor oil, other chemotherapy, other medicines, foods, dyes, or preservatives  pregnant or trying to get pregnant  breast-feeding How should I use this medicine? This drug is given as an infusion into a vein. It is administered in a hospital or clinic by a specially trained health care professional. Talk to your pediatrician regarding the use of this medicine in children. Special care may be needed. Overdosage: If you think you have taken too much of this medicine contact a poison control center or emergency room at once. NOTE: This medicine is only for you. Do not share this medicine with others. What if I miss a dose? It is important not to miss your dose. Call your doctor or health care professional if you are unable to keep an appointment. What may interact with this medicine? Do not take this medicine with any of the following medications:  disulfiram  metronidazole This medicine may also interact with the following medications:  antiviral medicines for hepatitis, HIV or AIDS  certain antibiotics like erythromycin and  clarithromycin  certain medicines for fungal infections like ketoconazole and itraconazole  certain medicines for seizures like carbamazepine, phenobarbital, phenytoin  gemfibrozil  nefazodone  rifampin  St. John's wort This list may not describe all possible interactions. Give your health care provider a list of all the medicines, herbs, non-prescription drugs, or dietary supplements you use. Also tell them if you smoke, drink alcohol, or use illegal drugs. Some items may interact with your medicine. What should I watch for while using this medicine? Your condition will be monitored carefully while you are receiving this medicine. You will need important blood work done while you are taking this medicine. This medicine can cause serious allergic reactions. To reduce your risk you will need to take other medicine(s) before treatment with this medicine. If you experience allergic reactions like skin rash, itching or hives, swelling of the face, lips, or tongue, tell your doctor or health care professional right away. In some cases, you may be given additional medicines to help with side effects. Follow all directions for their use. This drug may make you feel generally unwell. This is not uncommon, as chemotherapy can affect healthy cells as well as cancer cells. Report any side effects. Continue your course of treatment even though you feel ill unless your doctor tells you to stop. Call your doctor or health care professional for advice if you get a fever, chills or sore throat, or other symptoms of a cold or flu. Do not treat yourself. This drug decreases your body's ability to fight infections. Try to avoid being around people who are sick. This medicine may increase your risk to bruise  or bleed. Call your doctor or health care professional if you notice any unusual bleeding. Be careful brushing and flossing your teeth or using a toothpick because you may get an infection or bleed more easily.  If you have any dental work done, tell your dentist you are receiving this medicine. Avoid taking products that contain aspirin, acetaminophen, ibuprofen, naproxen, or ketoprofen unless instructed by your doctor. These medicines may hide a fever. Do not become pregnant while taking this medicine. Women should inform their doctor if they wish to become pregnant or think they might be pregnant. There is a potential for serious side effects to an unborn child. Talk to your health care professional or pharmacist for more information. Do not breast-feed an infant while taking this medicine. Men are advised not to father a child while receiving this medicine. This product may contain alcohol. Ask your pharmacist or healthcare provider if this medicine contains alcohol. Be sure to tell all healthcare providers you are taking this medicine. Certain medicines, like metronidazole and disulfiram, can cause an unpleasant reaction when taken with alcohol. The reaction includes flushing, headache, nausea, vomiting, sweating, and increased thirst. The reaction can last from 30 minutes to several hours. What side effects may I notice from receiving this medicine? Side effects that you should report to your doctor or health care professional as soon as possible:  allergic reactions like skin rash, itching or hives, swelling of the face, lips, or tongue  breathing problems  changes in vision  fast, irregular heartbeat  high or low blood pressure  mouth sores  pain, tingling, numbness in the hands or feet  signs of decreased platelets or bleeding - bruising, pinpoint red spots on the skin, black, tarry stools, blood in the urine  signs of decreased red blood cells - unusually weak or tired, feeling faint or lightheaded, falls  signs of infection - fever or chills, cough, sore throat, pain or difficulty passing urine  signs and symptoms of liver injury like dark yellow or brown urine; general ill feeling or  flu-like symptoms; light-colored stools; loss of appetite; nausea; right upper belly pain; unusually weak or tired; yellowing of the eyes or skin  swelling of the ankles, feet, hands  unusually slow heartbeat Side effects that usually do not require medical attention (report to your doctor or health care professional if they continue or are bothersome):  diarrhea  hair loss  loss of appetite  muscle or joint pain  nausea, vomiting  pain, redness, or irritation at site where injected  tiredness This list may not describe all possible side effects. Call your doctor for medical advice about side effects. You may report side effects to FDA at 1-800-FDA-1088. Where should I keep my medicine? This drug is given in a hospital or clinic and will not be stored at home. NOTE: This sheet is a summary. It may not cover all possible information. If you have questions about this medicine, talk to your doctor, pharmacist, or health care provider.  2020 [employer] Standard (2017-08-15 13:14:55) Carboplatin injection What is this medicine? CARBOPLATIN (KAR boe pla tin) is a chemotherapy drug. It targets fast dividing cells, like cancer cells, and causes these cells to die. This medicine is used to treat ovarian cancer and many other cancers. This medicine may be used for other purposes; ask your health care provider or pharmacist if you have questions. COMMON BRAND NAME(S): Paraplatin What should I tell my health care provider before I take this medicine? They need to  know if you have any of these conditions:  blood disorders  hearing problems  kidney disease  recent or ongoing radiation therapy  an unusual or allergic reaction to carboplatin, cisplatin, other chemotherapy, other medicines, foods, dyes, or preservatives  pregnant or trying to get pregnant  breast-feeding How should I use this medicine? This drug is usually given as an infusion into a vein. It is administered in a  hospital or clinic by a specially trained health care professional. Talk to your pediatrician regarding the use of this medicine in children. Special care may be needed. Overdosage: If you think you have taken too much of this medicine contact a poison control center or emergency room at once. NOTE: This medicine is only for you. Do not share this medicine with others. What if I miss a dose? It is important not to miss a dose. Call your doctor or health care professional if you are unable to keep an appointment. What may interact with this medicine?  medicines for seizures  medicines to increase blood counts like filgrastim, pegfilgrastim, sargramostim  some antibiotics like amikacin, gentamicin, neomycin, streptomycin, tobramycin  vaccines Talk to your doctor or health care professional before taking any of these medicines:  acetaminophen  aspirin  ibuprofen  ketoprofen  naproxen This list may not describe all possible interactions. Give your health care provider a list of all the medicines, herbs, non-prescription drugs, or dietary supplements you use. Also tell them if you smoke, drink alcohol, or use illegal drugs. Some items may interact with your medicine. What should I watch for while using this medicine? Your condition will be monitored carefully while you are receiving this medicine. You will need important blood work done while you are taking this medicine. This drug may make you feel generally unwell. This is not uncommon, as chemotherapy can affect healthy cells as well as cancer cells. Report any side effects. Continue your course of treatment even though you feel ill unless your doctor tells you to stop. In some cases, you may be given additional medicines to help with side effects. Follow all directions for their use. Call your doctor or health care professional for advice if you get a fever, chills or sore throat, or other symptoms of a cold or flu. Do not treat  yourself. This drug decreases your body's ability to fight infections. Try to avoid being around people who are sick. This medicine may increase your risk to bruise or bleed. Call your doctor or health care professional if you notice any unusual bleeding. Be careful brushing and flossing your teeth or using a toothpick because you may get an infection or bleed more easily. If you have any dental work done, tell your dentist you are receiving this medicine. Avoid taking products that contain aspirin, acetaminophen, ibuprofen, naproxen, or ketoprofen unless instructed by your doctor. These medicines may hide a fever. Do not become pregnant while taking this medicine. Women should inform their doctor if they wish to become pregnant or think they might be pregnant. There is a potential for serious side effects to an unborn child. Talk to your health care professional or pharmacist for more information. Do not breast-feed an infant while taking this medicine. What side effects may I notice from receiving this medicine? Side effects that you should report to your doctor or health care professional as soon as possible:  allergic reactions like skin rash, itching or hives, swelling of the face, lips, or tongue  signs of infection - fever or  chills, cough, sore throat, pain or difficulty passing urine  signs of decreased platelets or bleeding - bruising, pinpoint red spots on the skin, black, tarry stools, nosebleeds  signs of decreased red blood cells - unusually weak or tired, fainting spells, lightheadedness  breathing problems  changes in hearing  changes in vision  chest pain  high blood pressure  low blood counts - This drug may decrease the number of white blood cells, red blood cells and platelets. You may be at increased risk for infections and bleeding.  nausea and vomiting  pain, swelling, redness or irritation at the injection site  pain, tingling, numbness in the hands or  feet  problems with balance, talking, walking  trouble passing urine or change in the amount of urine Side effects that usually do not require medical attention (report to your doctor or health care professional if they continue or are bothersome):  hair loss  loss of appetite  metallic taste in the mouth or changes in taste This list may not describe all possible side effects. Call your doctor for medical advice about side effects. You may report side effects to FDA at 1-800-FDA-1088. Where should I keep my medicine? This drug is given in a hospital or clinic and will not be stored at home. NOTE: This sheet is a summary. It may not cover all possible information. If you have questions about this medicine, talk to your doctor, pharmacist, or health care provider.  2020 [employer] Standard (2008-03-18 14:38:05)

## 2019-11-29 NOTE — Progress Notes (Signed)
START OFF PATHWAY REGIMEN - Other   [id number]:Carboplatin + Paclitaxel (2/50) + RT weekly x 6 weeks:   Administer weekly during RT:     Paclitaxel      Carboplatin   **Always confirm dose/schedule in your pharmacy ordering system**  Patient Characteristics: Intent of Therapy: Non-Curative / Palliative Intent, Discussed with Patient

## 2019-11-29 NOTE — Progress Notes (Signed)
I connected with Kyle Guerrero on 11/29/19 at  2:45 PM EST by video enabled telemedicine visit and verified that I am speaking with the correct person using two identifiers.   I discussed the limitations, risks, security and privacy concerns of performing an evaluation and management service by telemedicine and the availability of in-person appointments. I also discussed with the patient that there may be a patient responsible charge related to this service. The patient expressed understanding and agreed to proceed.  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  Patient has also seen pulmonary and has been set up for bronchoscopy and EBUS guided biopsy on 11/23/2019.  Patient underwent.  PET CT scan which showed a hypermetabolic spiculated 3.5 cm of 5 left upper lobe lung mass, adjacent hypermetabolic 3.2 x 1.2 cm pleural metastases in the medial posterior by the left pleural space along with scalloping of the adjacent posterior left third rib.  Hypermetabolic infiltrative left perihilar conglomerate nodal metastases measuring up to 7.3 x 3.6 cm and 0.8 cm high left mediastinal node between the left brachiocephalic vein and left subclavian artery.  No evidence of distant metastatic disease  Biopsy showed non-small cell lung cancer but further characterization could not be determined.   Insufficient tissue for NGS testing  Interval history: Patient had one scant episode of hemoptysis right after his bronchoscopy which has not happened since then.  He has baseline mild shortness of breath but is able to continue with his work and ADLs without any significant distress.   Review of Systems  Constitutional: Positive for malaise/fatigue. Negative for chills, fever and weight loss.  HENT: Negative for congestion, ear discharge and nosebleeds.   Eyes: Negative for blurred vision.  Respiratory: Positive for shortness of breath. Negative for cough, hemoptysis, sputum production and wheezing.   Cardiovascular: Negative for chest pain, palpitations, orthopnea and claudication.  Gastrointestinal: Negative for abdominal pain, blood in stool, constipation, diarrhea, heartburn, melena, nausea and vomiting.  Genitourinary: Negative for dysuria, flank pain, frequency, hematuria and urgency.  Musculoskeletal: Negative for back pain, joint pain and myalgias.  Skin: Negative for rash.  Neurological: Negative for dizziness, tingling, focal weakness, seizures, weakness and headaches.  Endo/Heme/Allergies: Does not bruise/bleed easily.  Psychiatric/Behavioral: Negative for depression and suicidal ideas. The patient does not have insomnia.     No Known Allergies  No past medical history on file.  Past Surgical History:  Procedure Laterality Date  . CYST EXCISION    . VIDEO BRONCHOSCOPY WITH ENDOBRONCHIAL ULTRASOUND Left 11/22/2019   Procedure: VIDEO BRONCHOSCOPY WITH ENDOBRONCHIAL ULTRASOUND;  Surgeon: Tyler Pita, MD;  Location: [facility] ORS;  Service: Thoracic;  Laterality: Left;    Social History   Socioeconomic History  . Marital status: Single    Spouse name: Not on file  . Number of children: Not on file  . Years of education: Not on  file  . Highest education level: Not on file  Occupational History  . Not on file  Social Needs  . Financial resource strain: Not on file   . Food insecurity    Worry: Not on file    Inability: Not on file  . Transportation needs    Medical: Not on file    Non-medical: Not on file  Tobacco Use  . Smoking status: Former Smoker    Packs/day: 2.00    Types: Cigarettes    Quit date: 11/08/2019    Years since quitting: 0.0  . Smokeless tobacco: Never Used  Substance and Sexual Activity  . Alcohol use: Yes  . Drug use: Yes    Types: Marijuana  . Sexual activity: Not on file  Lifestyle  . Physical activity    Days per week: Not on file    Minutes per session: Not on file  . Stress: Not on file  Relationships  . Social Herbalist on phone: Not on file    Gets together: Not on file    Attends religious service: Not on file    Active member of club or organization: Not on file    Attends meetings of clubs or organizations: Not on file    Relationship status: Not on file  . Intimate partner violence    Fear of current or ex partner: Not on file    Emotionally abused: Not on file    Physically abused: Not on file    Forced sexual activity: Not on file  Other Topics Concern  . Not on file  Social History Narrative  . Not on file    Family History  Problem Relation Age of Onset  . Healthy Mother   . Healthy Father      Current Outpatient Medications:  .  azelastine (ASTELIN) 0.1 % nasal spray, Place 1 spray into the nose 2 (two) times daily., Disp: , Rfl:   Dg Chest 2 View  Result Date: 11/04/2019 CLINICAL DATA:  Chest pain. Additional history provided: Chest pain for 1 month. EXAM: CHEST - 2 VIEW COMPARISON:  Chest radiograph 12/17/2015 FINDINGS: Heart size within normal limits. There is increased density within the posterior left lung apex likely reflecting a mass and highly suspicious for bronchogenic carcinoma. The lungs are otherwise clear. No evidence of pleural effusion or pneumothorax. No acute bony abnormality. These results were called by telephone at the time of interpretation on 11/04/2019  at 11:05 am to provider [facility], who verbally acknowledged these results. IMPRESSION: Increased density within the posterior left lung apex likely reflecting a mass and highly suspicious for bronchogenic carcinoma. Chest CT with intravenous contrast recommended for further evaluation. Electronically Signed   By: Kellie Simmering DO   On: 11/04/2019 11:06   Ct Angio Chest Pe W And/or Wo Contrast  Result Date: 11/04/2019 CLINICAL DATA:  Chest pain. EXAM: CT ANGIOGRAPHY CHEST WITH CONTRAST TECHNIQUE: Multidetector CT imaging of the chest was performed using the standard protocol during bolus administration of intravenous contrast. Multiplanar CT image reconstructions and MIPs were obtained to evaluate the vascular anatomy. CONTRAST:  24m OMNIPAQUE IOHEXOL 350 MG/ML SOLN COMPARISON:  Radiograph of same day. FINDINGS: Cardiovascular: Satisfactory opacification of the pulmonary arteries to the segmental level. No evidence of pulmonary embolism. Normal heart size. No pericardial effusion. However, there is significant extrinsic compression of the left pulmonary artery secondary to surrounding adenopathy. Mediastinum/Nodes: 4.4 x 3.3 cm lobulated mass is noted in aortopulmonary window. 2.6 x  2.4 cm left hilar mass is noted. Thyroid gland is unremarkable. The esophagus appears normal. Lungs/Pleura: No pneumothorax or pleural effusion is noted. Right lung is clear. 3.8 x 3.3 cm mass is noted in left lung apex concerning for malignancy. Upper Abdomen: No acute abnormality. Musculoskeletal: No chest wall abnormality. No acute or significant osseous findings. Review of the MIP images confirms the above findings. IMPRESSION: No definite evidence of pulmonary embolus. 3.8 x 3.3 cm left apical mass is noted concerning for malignancy. Enlarged aortopulmonary window and left hilar masses or adenopathy is noted consistent with metastatic disease. PET scan is recommended for further evaluation. Electronically Signed   By: Marijo Conception M.D.   On: 11/04/2019 13:22   Nm Pet Image Initial (pi) Skull Base To Thigh  Result Date: 11/18/2019 CLINICAL DATA:  Initial treatment strategy for left apical lung mass. EXAM: NUCLEAR MEDICINE PET SKULL BASE TO THIGH TECHNIQUE: 9.0 mCi F-18 FDG was injected intravenously. Full-ring PET imaging was performed from the skull base to thigh after the radiotracer. CT data was obtained and used for attenuation correction and anatomic localization. Fasting blood glucose: 104 mg/dl COMPARISON:  11/04/2019 chest CT angiogram. FINDINGS: Mediastinal blood pool activity: SUV max 1.9 Liver activity: SUV max NA NECK: No hypermetabolic lymph nodes in the neck. Incidental CT findings: none CHEST: Spiculated solid 3.5 cm apical left upper lobe lung mass with max SUV 12.9 (series 3/image 79). Adjacent plaque-like hypermetabolic 3.2 x 1.2 cm mass centered in the posterior medial apical left pleural space with max SUV 13.6 (series 3/image 73), with slight scalloping of the adjacent posterior left third rib. Hypermetabolic infiltrative solid 7.3 x 3.6 cm left upper perihilar mass with max SUV 19.2 (series 3/image 93), extending into the AP window and left hilum. Hypermetabolic 0.8 cm high left mediastinal node between the left brachiocephalic vein and left subclavian artery with max SUV 12.7 (series 3/image 74). No enlarged or hypermetabolic contralateral mediastinal or contralateral hilar lymph nodes. No enlarged or hypermetabolic axillary lymph nodes. Incidental CT findings: Anterior right lower lobe 3 mm pulmonary nodule (series 3/image 90), below PET resolution. ABDOMEN/PELVIS: No abnormal hypermetabolic activity within the liver, pancreas, adrenal glands, or spleen. No hypermetabolic lymph nodes in the abdomen or pelvis. Incidental CT findings: Minimally atherosclerotic nonaneurysmal abdominal aorta. SKELETON: No focal hypermetabolic activity to suggest skeletal metastasis. Incidental CT findings: none IMPRESSION: 1.  Hypermetabolic spiculated 3.5 cm apical left upper lobe lung mass, compatible with primary bronchogenic carcinoma. 2. Adjacent hypermetabolic 3.2 x 1.2 cm pleural metastasis in the medial posterior apical left pleural space with scalloping of the adjacent posterior left third rib. 3. Hypermetabolic infiltrative upper left perihilar conglomerate nodal metastasis extending into the AP window and left hilum. 4. Hypermetabolic ipsilateral high left mediastinal nodal metastasis. No hypermetabolic contralateral mediastinal or contralateral hilar metastases. 5. Tiny 3 mm right lower lobe pulmonary nodule, below PET resolution, recommend attention on follow-up chest CT in 3 months. 6. No hypermetabolic extrathoracic or osseous metastases. 7.  Aortic Atherosclerosis (ICD10-I70.0). Electronically Signed   By: Ilona Sorrel M.D.   On: 11/18/2019 15:01   Dg Chest Port 1 View  Result Date: 11/22/2019 CLINICAL DATA:  Post LEFT lung biopsy EXAM: PORTABLE CHEST 1 VIEW COMPARISON:  Portable exam 1415 hours compared to 11/04/2019 Correlation: CT angio chest 11/04/2019, PET-CT 11/18/2019 FINDINGS: Normal heart size, mediastinal contours, and pulmonary vascularity. LEFT hilar enlargement and superior retraction of LEFT hilum. Linear subsegmental atelectasis RIGHT upper lobe. Progressive opacity in the LEFT upper lobe  especially medially consistent with known suprahilar mass and slightly increased postobstructive pneumonitis. Remaining lungs clear. No pleural effusion or pneumothorax. IMPRESSION: Known LEFT upper lobe mass, LEFT hilar adenopathy, and increased postobstructive pneumonitis in LEFT upper lobe. Subsegmental atelectasis RIGHT upper lobe. Electronically Signed   By: Lavonia Dana M.D.   On: 11/22/2019 14:34   Dg C-arm 1-60 Min-no Report  Result Date: 11/22/2019 Fluoroscopy was utilized by the requesting physician.  No radiographic interpretation.    No images are attached to the encounter.   CMP Latest Ref Rng &  Units 11/04/2019  Glucose 70 - 99 mg/dL 126(H)  BUN 6 - 20 mg/dL 9  Creatinine 0.61 - 1.24 mg/dL 0.93  Sodium 135 - 145 mmol/L 138  Potassium 3.5 - 5.1 mmol/L 3.8  Chloride 98 - 111 mmol/L 103  CO2 22 - 32 mmol/L 26  Calcium 8.9 - 10.3 mg/dL 8.7(L)   CBC Latest Ref Rng & Units 11/04/2019  WBC 4.0 - 10.5 K/uL 5.7  Hemoglobin 13.0 - 17.0 g/dL 12.5(L)  Hematocrit 39.0 - 52.0 % 37.8(L)  Platelets 150 - 400 K/uL 533(H)     Observation/objective: Appears in no acute distress on video visit today.  Breathing is nonlabored  Assessment and plan: Patient is a 48 year old male with a history of stage IVa non-small cell lung cancer cT2 cN2 cM1 a  I discussed the results of the biopsy with the patient which shows non-small cell lung cancer but further characterization between adenocarcinoma or squamous cell carcinoma could not be determined.  There was also insufficient tissue for PD-L1 and NGS testing.  Given patient's young age I would like to be aggressive in obtaining NGS testing and would like to get a repeat biopsy at this time.  He will be scheduled for an IR guided biopsy of the left upper lobe lesion on 12/05/2019.  We discussed patient's case at tumor board and options were concurrent chemoradiation versus pursuing aggressive combination chemoimmunotherapy.  There are ongoing concerns about patient's gradual development of shortness of breath and patient will therefore be seeing radiation oncology next week.  Radiation oncology plans to treat him aggressively with 6 to 7 weeks of radiation treatment instead of palliative radiation.  He will proceed with port placement and I will start weekly carbotaxol next week.  I am currently awaiting PD-L1 testing on his repeat biopsy specimen.  If PD-L1 expression is high I will proceed with adding immunotherapy along with chemotherapy even while he is getting radiation treatment.  However if PD-L1 shows low expression, I will reserve immunotherapy after  radiation treatment is completed.  Discussed risks and benefits of carboplatin AUC 2 IV along with Taxol at 50 mg per metered square weekly including all but not limited to nausea, vomiting, low blood counts, risk of infections and hospitalization.  Risk of infusion reaction and peripheral neuropathy associated with Taxol.  Treatment will be given with a palliative intent.  Patient understands and agrees to proceed as planned.  I will see him next week to start first cycle of weekly carbotaxol chemotherapy on 12/03/2019.  MRI brain to complete his staging work-up is currently pending.  If patient found to have any actionable mutations I will consider switching him to that after completion of chemoradiation  Follow-up instructions: As above  Cancer Staging Malignant neoplasm of upper lobe of left lung [facility]) Staging form: Lung, AJCC 8th Edition - Clinical stage from 11/29/2019: Stage IVA (cT2, cN2, cM1a) - Signed by Sindy Guadeloupe, MD on 11/29/2019  I discussed the assessment and treatment plan with the patient. The patient was provided an opportunity to ask questions and all were answered. The patient agreed with the plan and demonstrated an understanding of the instructions.   The patient was advised to call back or seek an in-person evaluation if the symptoms worsen or if the condition fails to improve as anticipated.   Visit Diagnosis: 1. Malignant neoplasm of upper lobe of left lung ([place])   2. Goals of care, counseling/discussion     Dr. Randa Evens, MD, MPH [facility] at [facility] [phone] 11/29/2019 4:15 PM

## 2019-11-29 NOTE — Telephone Encounter (Signed)
Lm to schedule 3-4wk rov with LG

## 2019-12-02 ENCOUNTER — Inpatient Hospital Stay: Payer: 59

## 2019-12-02 ENCOUNTER — Other Ambulatory Visit: Payer: Self-pay

## 2019-12-02 ENCOUNTER — Encounter: Payer: Self-pay | Admitting: Oncology

## 2019-12-02 ENCOUNTER — Ambulatory Visit
Admission: RE | Admit: 2019-12-02 | Discharge: 2019-12-02 | Disposition: A | Discharge status: Home / Self Care | Payer: 59 | Source: Ambulatory Visit | Attending: Radiation Oncology | Admitting: Radiation Oncology

## 2019-12-02 ENCOUNTER — Other Ambulatory Visit: Payer: Self-pay | Admitting: *Deleted

## 2019-12-02 ENCOUNTER — Ambulatory Visit: Payer: 59 | Admitting: Radiation Oncology

## 2019-12-02 ENCOUNTER — Encounter: Payer: Self-pay | Admitting: *Deleted

## 2019-12-02 ENCOUNTER — Other Ambulatory Visit
Admission: RE | Admit: 2019-12-02 | Discharge: 2019-12-02 | Disposition: A | Discharge status: Home / Self Care | Payer: 59 | Source: Ambulatory Visit | Attending: Oncology | Admitting: Oncology

## 2019-12-02 ENCOUNTER — Ambulatory Visit
Admission: RE | Admit: 2019-12-02 | Discharge: 2019-12-02 | Disposition: A | Discharge status: Home / Self Care | Payer: 59 | Source: Ambulatory Visit | Attending: Oncology | Admitting: Oncology

## 2019-12-02 ENCOUNTER — Encounter: Payer: Self-pay | Admitting: Radiation Oncology

## 2019-12-02 ENCOUNTER — Other Ambulatory Visit: Payer: 59

## 2019-12-02 ENCOUNTER — Encounter: Payer: Self-pay | Admitting: Pulmonary Disease

## 2019-12-02 VITALS — BP 129/95 | HR 93 | Temp 97.3°F | Resp 16 | Wt 161.1 lb

## 2019-12-02 DIAGNOSIS — M899 Disorder of bone, unspecified: Secondary | ICD-10-CM | POA: Diagnosis not present

## 2019-12-02 DIAGNOSIS — I6789 Other cerebrovascular disease: Secondary | ICD-10-CM | POA: Diagnosis not present

## 2019-12-02 DIAGNOSIS — Z7952 Long term (current) use of systemic steroids: Secondary | ICD-10-CM | POA: Diagnosis not present

## 2019-12-02 DIAGNOSIS — R5383 Other fatigue: Secondary | ICD-10-CM | POA: Diagnosis not present

## 2019-12-02 DIAGNOSIS — R7989 Other specified abnormal findings of blood chemistry: Secondary | ICD-10-CM | POA: Diagnosis not present

## 2019-12-02 DIAGNOSIS — Z20828 Contact with and (suspected) exposure to other viral communicable diseases: Secondary | ICD-10-CM | POA: Diagnosis not present

## 2019-12-02 DIAGNOSIS — C3412 Malignant neoplasm of upper lobe, left bronchus or lung: Secondary | ICD-10-CM | POA: Insufficient documentation

## 2019-12-02 DIAGNOSIS — C778 Secondary and unspecified malignant neoplasm of lymph nodes of multiple regions: Secondary | ICD-10-CM | POA: Insufficient documentation

## 2019-12-02 DIAGNOSIS — C771 Secondary and unspecified malignant neoplasm of intrathoracic lymph nodes: Secondary | ICD-10-CM | POA: Diagnosis not present

## 2019-12-02 DIAGNOSIS — R000 Tachycardia, unspecified: Secondary | ICD-10-CM | POA: Diagnosis not present

## 2019-12-02 DIAGNOSIS — G9389 Other specified disorders of brain: Secondary | ICD-10-CM | POA: Diagnosis not present

## 2019-12-02 DIAGNOSIS — Z87891 Personal history of nicotine dependence: Secondary | ICD-10-CM | POA: Insufficient documentation

## 2019-12-02 DIAGNOSIS — Z5111 Encounter for antineoplastic chemotherapy: Secondary | ICD-10-CM | POA: Diagnosis not present

## 2019-12-02 DIAGNOSIS — Z79899 Other long term (current) drug therapy: Secondary | ICD-10-CM | POA: Insufficient documentation

## 2019-12-02 DIAGNOSIS — R918 Other nonspecific abnormal finding of lung field: Secondary | ICD-10-CM

## 2019-12-02 DIAGNOSIS — C7802 Secondary malignant neoplasm of left lung: Secondary | ICD-10-CM | POA: Diagnosis not present

## 2019-12-02 LAB — CBC WITH DIFFERENTIAL/PLATELET
Abs Immature Granulocytes: 0.03 10*3/uL (ref 0.00–0.07)
Basophils Absolute: 0 10*3/uL (ref 0.0–0.1)
Basophils Relative: 0 %
Eosinophils Absolute: 0.1 10*3/uL (ref 0.0–0.5)
Eosinophils Relative: 1 %
HCT: 42.4 % (ref 39.0–52.0)
Hemoglobin: 13.7 g/dL (ref 13.0–17.0)
Immature Granulocytes: 0 %
Lymphocytes Relative: 25 %
Lymphs Abs: 1.8 10*3/uL (ref 0.7–4.0)
MCH: 28.5 pg (ref 26.0–34.0)
MCHC: 32.3 g/dL (ref 30.0–36.0)
MCV: 88.3 fL (ref 80.0–100.0)
Monocytes Absolute: 0.9 10*3/uL (ref 0.1–1.0)
Monocytes Relative: 13 %
Neutro Abs: 4.2 10*3/uL (ref 1.7–7.7)
Neutrophils Relative %: 61 %
Platelets: 676 10*3/uL — ABNORMAL HIGH (ref 150–400)
RBC: 4.8 MIL/uL (ref 4.22–5.81)
RDW: 13.9 % (ref 11.5–15.5)
WBC: 7 10*3/uL (ref 4.0–10.5)
nRBC: 0 % (ref 0.0–0.2)

## 2019-12-02 LAB — COMPREHENSIVE METABOLIC PANEL
ALT: 27 U/L (ref 0–44)
AST: 16 U/L (ref 15–41)
Albumin: 3.6 g/dL (ref 3.5–5.0)
Alkaline Phosphatase: 160 U/L — ABNORMAL HIGH (ref 38–126)
Anion gap: 7 (ref 5–15)
BUN: 12 mg/dL (ref 6–20)
CO2: 27 mmol/L (ref 22–32)
Calcium: 9 mg/dL (ref 8.9–10.3)
Chloride: 99 mmol/L (ref 98–111)
Creatinine, Ser: 0.91 mg/dL (ref 0.61–1.24)
GFR calc Af Amer: >60 mL/min (ref >60)
GFR calc non Af Amer: >60 mL/min (ref >60)
Glucose, Bld: 98 mg/dL (ref 70–99)
Potassium: 3.9 mmol/L (ref 3.5–5.1)
Sodium: 133 mmol/L — ABNORMAL LOW (ref 135–145)
Total Bilirubin: 0.4 mg/dL (ref 0.3–1.2)
Total Protein: 8.4 g/dL — ABNORMAL HIGH (ref 6.5–8.1)

## 2019-12-02 LAB — SARS CORONAVIRUS 2 (TAT 6-24 HRS): SARS Coronavirus 2: NEGATIVE

## 2019-12-02 IMAGING — MR MR HEAD WO/W CM
14 series · 45 of 48 positions shown · IV contrast (gadavist)
Comparison: Nuclear medicine PET scan [DATE]

CLINICAL DATA: Lung mass.

EXAM:
MRI HEAD WITHOUT AND WITH CONTRAST
TECHNIQUE: Multiplanar, multiecho pulse sequences of the brain and surrounding
structures were obtained without and with intravenous contrast.
CONTRAST:  7mL GADAVIST GADOBUTROL 1 MMOL/ML IV SOLN

[Series 5: ax dwi_tracew · axial · 3.0mm · 0.60mm/px · z∈[-81,+74]mm · 4 of 48 slices shown]
[im 1/48]
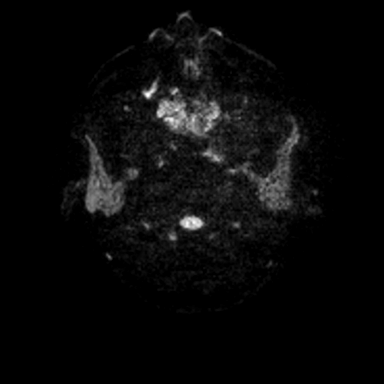
[im 16/48]
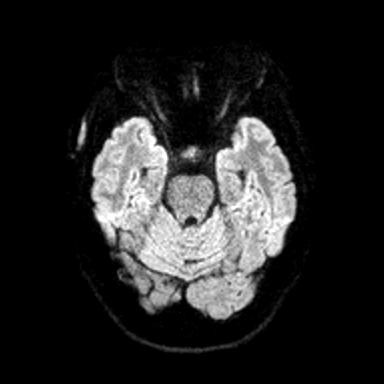
[im 32/48]
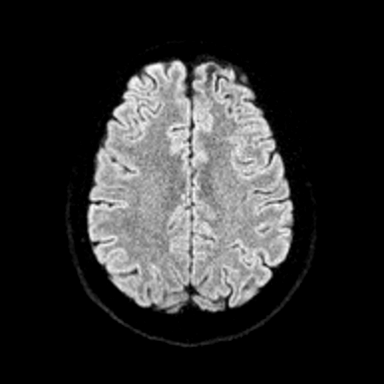
[im 48/48]
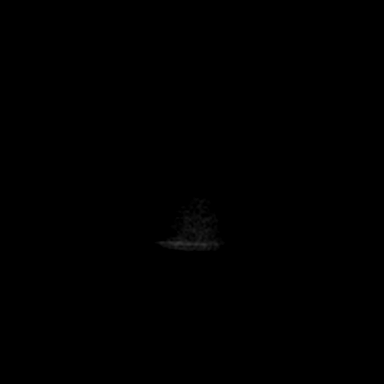

[Series 6: ax dwi_adc · axial · 3.0mm · 0.60mm/px · z∈[-81,+74]mm · 4 of 48 slices shown]
[im 1/48]
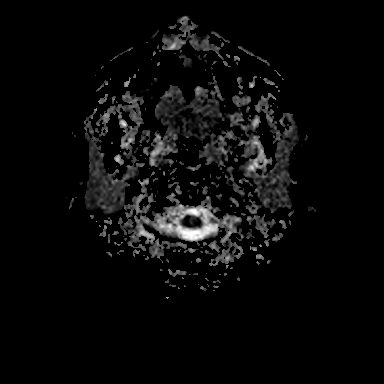
[im 16/48]
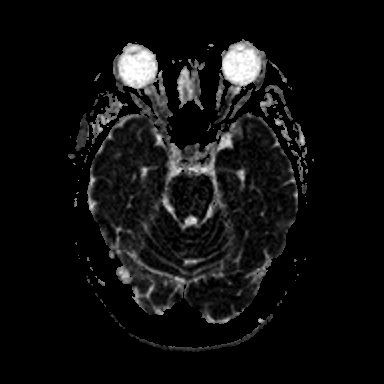
[im 32/48]
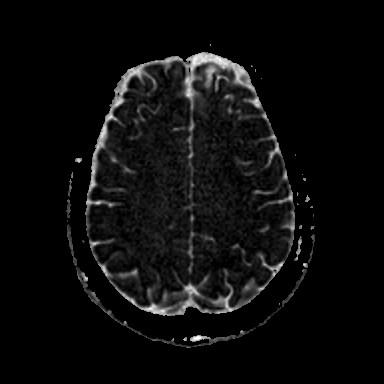
[im 48/48]
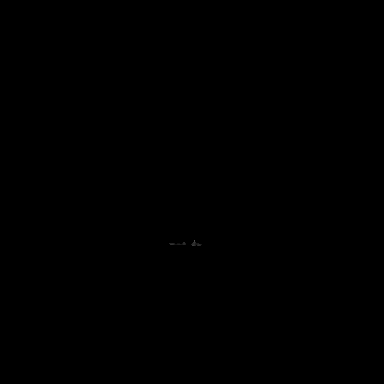

[Series 7: cor dwi_tracew · coronal · 5.0mm · 0.60mm/px · 2 of 40 slices shown]
[im 1/40]
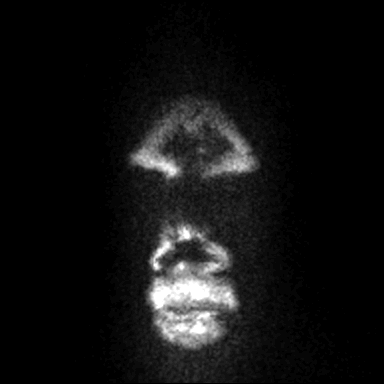
[im 40/40]
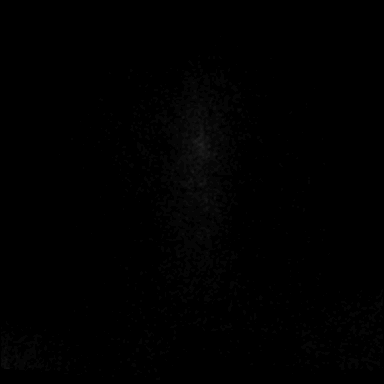

[Series 8: cor dwi_adc · coronal · 5.0mm · 0.60mm/px · 2 of 39 slices shown]
[im 1/39]
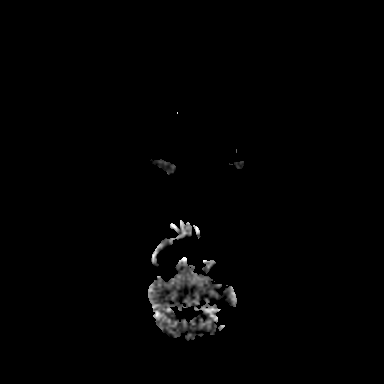
[im 39/39]
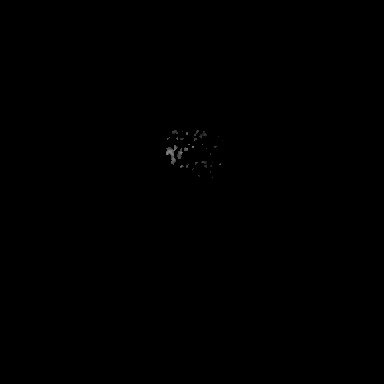

[Series 9: T1 · sagittal · 5.0mm · 0.62mm/px · 1 of 25 slices shown (1 of 2)]
[im 1/25]
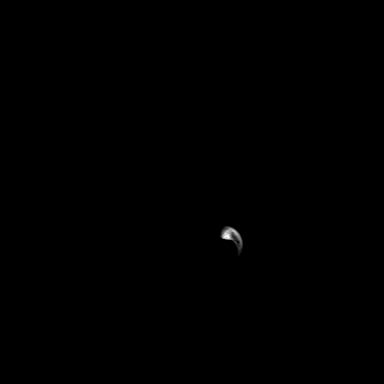

[Series 10: T2 · axial · 5.0mm · 0.53mm/px · 1 of 25 slices shown]
[im 1/25]
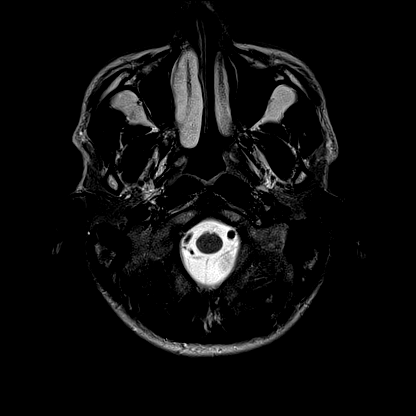

[Series 12: pha_images · axial · 3.0mm · 0.90mm/px · z∈[-87,+89]mm · 3 of 59 slices shown]
[im 1/59]
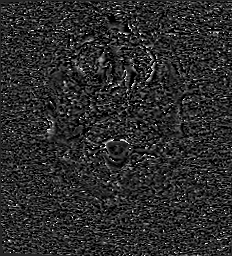
[im 30/59]
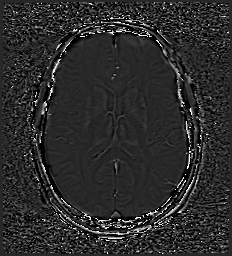
[im 59/59]
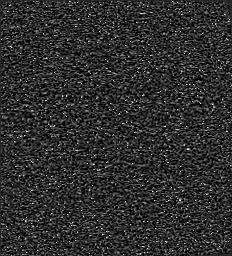

[Series 13: swi_images · axial · 3.0mm · 0.90mm/px · z∈[-87,+89]mm · 3 of 60 slices shown]
[im 1/60]
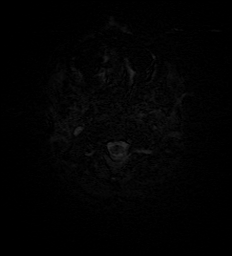
[im 30/60]
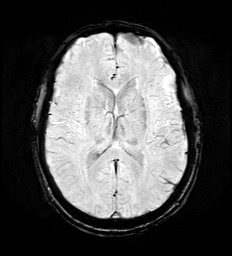
[im 60/60]
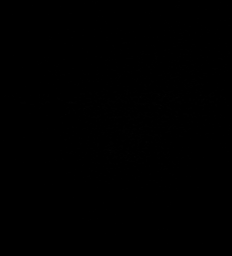

[Series 15: FLAIR · axial · 3.0mm · 0.53mm/px · z∈[-85,+76]mm · 3 of 55 slices shown]
[im 1/55]
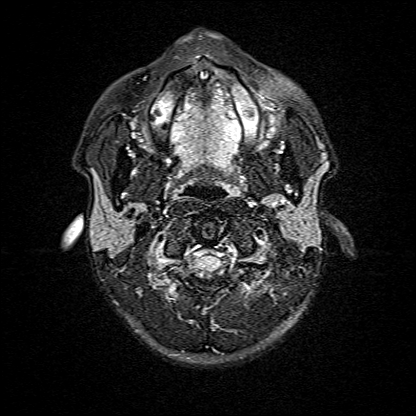
[im 28/55]
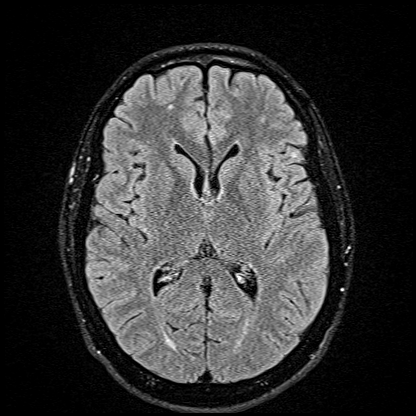
[im 55/55]
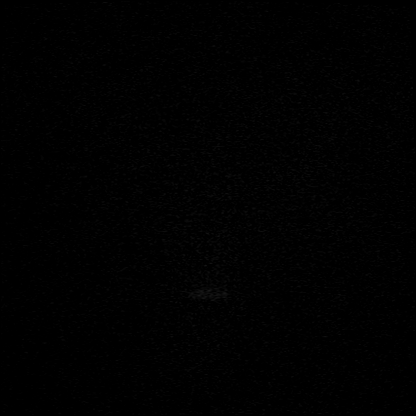

[Series 16: T1 · axial · 1.0mm · 0.98mm/px · z∈[-99,+75]mm · 8 of 176 slices shown (2 of 2)]
[im 1/176]
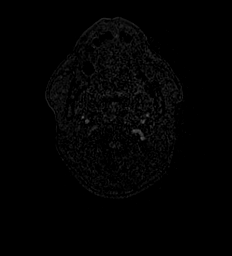
[im 20/176]
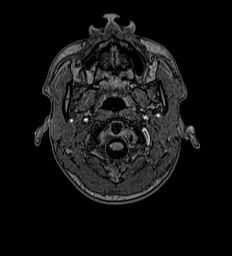
[im 59/176]
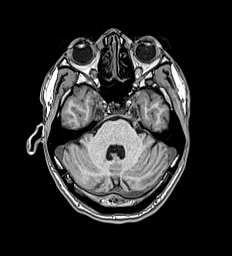
[im 78/176]
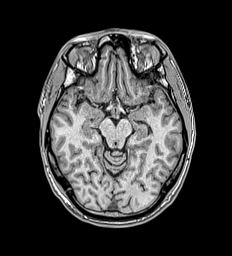
[im 98/176]
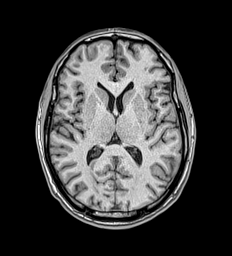
[im 117/176]
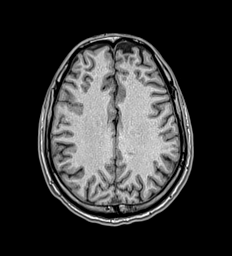
[im 156/176]
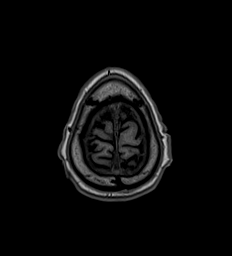
[im 176/176]
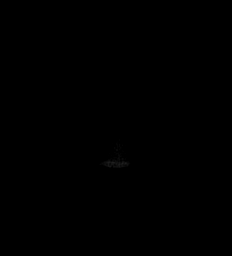

[Series 17: T2 post-contrast · coronal · 5.0mm · 0.57mm/px · 2 of 30 slices shown]
[im 1/30]
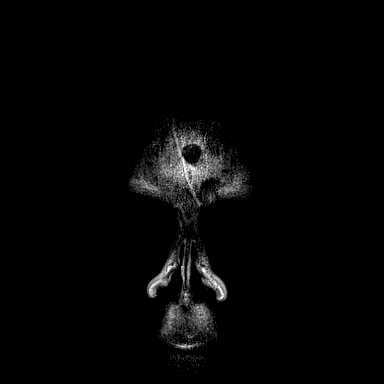
[im 30/30]
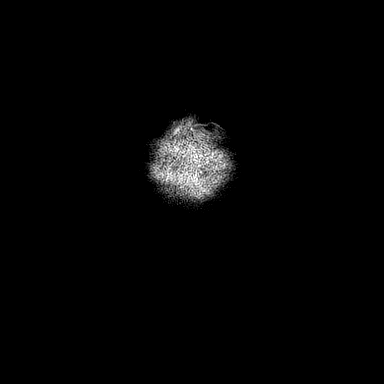

[Series 18: T1 post-contrast · axial · 1.0mm · 0.98mm/px · z∈[-99,+75]mm · 9 of 176 slices shown (1 of 3)]
[im 1/176]
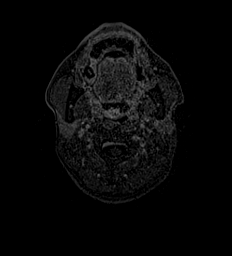
[im 20/176]
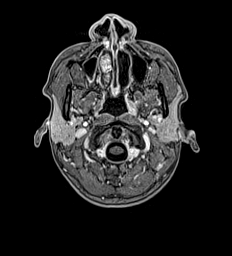
[im 39/176]
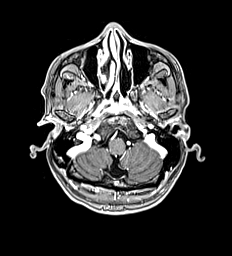
[im 59/176]
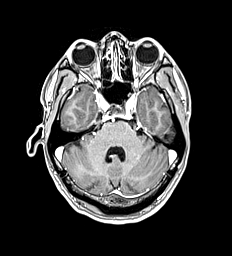
[im 78/176]
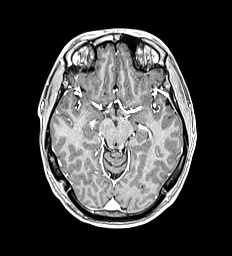
[im 98/176]
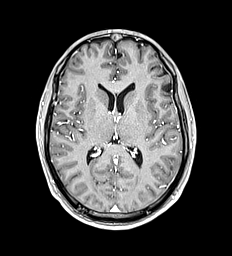
[im 117/176]
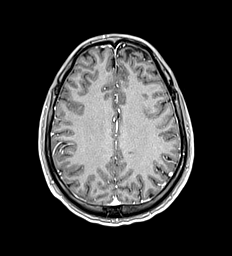
[im 156/176]
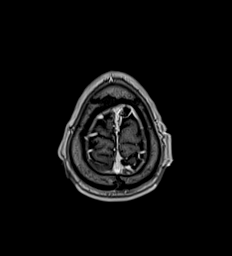
[im 176/176]
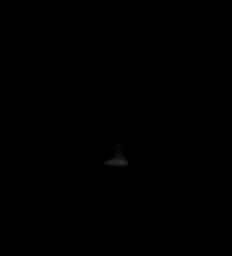

[Series 19: T1 post-contrast · coronal · 5.0mm · 0.57mm/px · 2 of 30 slices shown (2 of 3)]
[im 1/30]
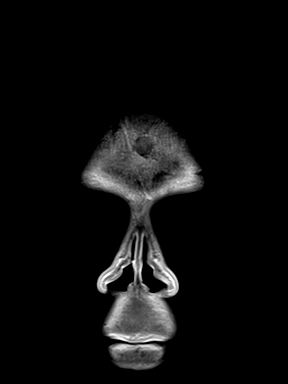
[im 30/30]
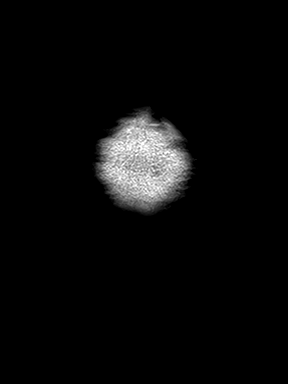

[Series 20: T1 post-contrast · sagittal · 5.0mm · 0.62mm/px · 1 of 25 slices shown (3 of 3)]
[im 1/25]
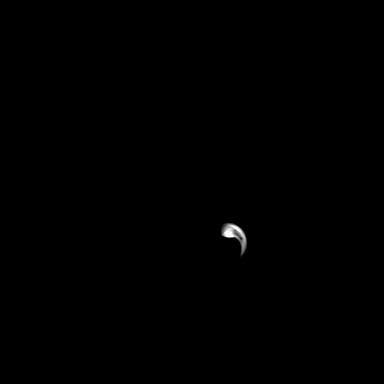

[45 of 48 positions shown; findings below may reference images not displayed]

FINDINGS: Brain:

There is no evidence of acute infarct.

No midline shift or extra-axial fluid collection.

No chronic intracranial blood products.

Small foci of cortical encephalomalacia within the anterior left
frontal lobe and left frontal operculum, which may reflect chronic
infarcts (series 15, image 37) (series 15, image 30). Mild scattered
T2/FLAIR hyperintensity within the cerebral white matter is
nonspecific, but consistent with chronic small vessel ischemic
disease.

Cerebral volume is normal for age.

Small developmental venous anomaly within the left cerebellum.

No other abnormal intracranial enhancement is demonstrated to
suggest intracranial metastatic disease.

Vascular: Flow voids maintained within the proximal large arterial
vessels.

Skull and upper cervical spine: There is an enhancing lesion within
the midline frontal calvarium, measuring 0.8 x 1.1 x 1.0 cm (series
18, image 100) (series 20, image 13). Associated scalloping of the
calvarium at this site. No other focal marrow lesion identified.

Sinuses/Orbits: Visualized orbits demonstrate no acute abnormality.
Minimal mucosal thickening within bilateral ethmoid and right
maxillary sinuses. No significant mastoid effusion.
IMPRESSION: 1. 1.1 cm enhancing lesion within the midline frontal calvarium,
which may reflect a calvarial metastasis or intra-diploic
meningioma. Attention recommended on follow-up.
2. No evidence of intracranial metastatic disease.
3. Small foci of cortical encephalomalacia within the left frontal
lobe, which may reflect chronic infarcts.
4. Mild chronic small vessel ischemic disease.

## 2019-12-02 MED ORDER — GADOBUTROL 1 MMOL/ML IV SOLN
7.0000 mL | Freq: Once | INTRAVENOUS | Status: AC | PRN
  Administered 2019-12-02: 7 mL via INTRAVENOUS

## 2019-12-02 NOTE — Progress Notes (Signed)
  Oncology Nurse Navigator Documentation  Navigator Location: [facility] (12/02/19 1500)   )Navigator Encounter Type: Appt/Treatment Plan Review;Initial RadOnc (12/02/19 1500)                         Barriers/Navigation Needs: Coordination of Care;Employed (12/02/19 1500)   Interventions: Coordination of Care (12/02/19 1500)   Coordination of Care: Appts (12/02/19 1500)       met with patient during initial consult with Dr. Baruch Gouty. All questions answered during visit. Reviewed upcoming appts with patient and his caregiver. Reassurance provided. Contact info given and instructed to call with any further questions or needs. Pt verbalized understanding. Nothing further needed at this time.            Time Spent with Patient: 90 (12/02/19 1500)

## 2019-12-02 NOTE — Research (Signed)
I met with patient and wife today after hisappointment with Dr.Chrystaltoreviewstudy "Procurement of Human Biospecimens for the Discovery and Validation of Biomarkers for the Predication, Diagnosis and Management of Disease" sponsored by [employer]. Dr. Janese Banks recommended study on 11/29/2019.  Ifurtherexplained the study to patient including purpose of the study, risks/benefits, participation requirements, voluntary participation and reimbursement provided by the study. Patient signed consent and HIPPA for study, both IRB approved 06/18/2019. Copy of signed forms given to patient. Per study guidelines, patient was asked ifhe had Novocain in the past week or had any history of cancer. Patient answered no to both of these questions. (If the patient were to have answered yes, he would be disqualified from study). Patient had a lab appointment today already and research labs were collected during lab draw.  Patient was given gift card provided by study.   Lula Olszewski Oncology research assistant 12/02/2019 12:12

## 2019-12-02 NOTE — Telephone Encounter (Signed)
Pt has been scheduled for OV 12/23/2019 at 4:30. Nothing further is needed.

## 2019-12-02 NOTE — Consult Note (Signed)
NEW PATIENT EVALUATION  Name: Kyle Guerrero  MRN: [id number]  Date:   12/02/2019     DOB: 1971/02/10   This 48 y.o. male patient presents to the clinic for initial evaluation of stage IIIb (T3 N2 M0) non-small cell lung cancer of the left upper lobe.    REFERRING PHYSICIAN: No ref. provider found  CHIEF COMPLAINT:  Chief Complaint  Patient presents with  . Lung Cancer    initial consultation    DIAGNOSIS: The encounter diagnosis was Malignant neoplasm of upper lobe of left lung ([place]).   PREVIOUS INVESTIGATIONS:  Pathology reviewed CT scans and PET CT scan reviewed Clinical notes reviewed Presented at weekly tumor conference  HPI: Patient is a 48 year old male who presented to the ER with increasing heaviness in his chest and upper left chest discomfort making it impossible to sleep on his stomach at night.  CT scan of his chest showed a 3.8 x 3.3 cm left upper lobe lung mass worrisome for malignancy.  He also had a 4.4 x 3.3 cm lobulated mass in the AP window and a 2.6 and 2.4 cm left hilar mass all concerning for malignancy.  He underwent EBUS guided biopsy showing cytology positive for malignancy favoring non-small cell lung cancer.  Bronchial velar lavage was positive as well as brushings.  Patient is an MRI of his brain pending.  He had some hemoptysis after his biopsy although that is improved the chest chest heaviness continues and chest pain also.  On review of his PET scan there is some scalloping of his rib which may be caused by local extension of tumor.  He was presented at weekly time conference question of whether this is stage IV based on pleural involvement was made.  Based on his young age and otherwise all disease confined to his chest I believe we should stage him is a 3B and treat him with concurrent chemoradiation with curative intent.  He is seen today for opinion he is doing well.  He specifically denies dysphagia no hemoptysis.  Continues to have chest pain as  described above.  PLANNED TREATMENT REGIMEN: Concurrent chemoradiation using IMRT treatment planning and delivery  PAST MEDICAL HISTORY:  has no past medical history on file.    PAST SURGICAL HISTORY:  Past Surgical History:  Procedure Laterality Date  . CYST EXCISION    . VIDEO BRONCHOSCOPY WITH ENDOBRONCHIAL ULTRASOUND Left 11/22/2019   Procedure: VIDEO BRONCHOSCOPY WITH ENDOBRONCHIAL ULTRASOUND;  Surgeon: Tyler Pita, MD;  Location: [facility] ORS;  Service: Thoracic;  Laterality: Left;    FAMILY HISTORY: family history includes Healthy in his father and mother.  SOCIAL HISTORY:  reports that he quit smoking about 3 weeks ago. His smoking use included cigarettes. He smoked 2.00 packs per day. He has never used smokeless tobacco. He reports current alcohol use. He reports current drug use. Drug: Marijuana.  ALLERGIES: Patient has no known allergies.  MEDICATIONS:  Current Outpatient Medications  Medication Sig Dispense Refill  . azelastine (ASTELIN) 0.1 % nasal spray Place 1 spray into the nose 2 (two) times daily.    Marland Kitchen dexamethasone (DECADRON) 4 MG tablet Take 2 tablets (8 mg total) by mouth daily. Start the day after chemotherapy for 2 days. 30 tablet 1  . lidocaine-prilocaine (EMLA) cream Apply to affected area once 30 g 3  . ondansetron (ZOFRAN) 8 MG tablet Take 1 tablet (8 mg total) by mouth 2 (two) times daily as needed for refractory nausea / vomiting. Start on day 3  after chemo. 30 tablet 1  . prochlorperazine (COMPAZINE) 10 MG tablet Take 1 tablet (10 mg total) by mouth every 6 (six) hours as needed (Nausea or vomiting). 30 tablet 1   No current facility-administered medications for this encounter.     ECOG PERFORMANCE STATUS:  1 - Symptomatic but completely ambulatory  REVIEW OF SYSTEMS: Except for the chest pain and cough Patient denies any weight loss, fatigue, weakness, fever, chills or night sweats. Patient denies any loss of vision, blurred vision. Patient denies  any ringing  of the ears or hearing loss. No irregular heartbeat. Patient denies heart murmur or history of fainting. Patient denies any chest pain or pain radiating to her upper extremities. Patient denies any shortness of breath, difficulty breathing at night, cough or hemoptysis. Patient denies any swelling in the lower legs. Patient denies any nausea vomiting, vomiting of blood, or coffee ground material in the vomitus. Patient denies any stomach pain. Patient states has had normal bowel movements no significant constipation or diarrhea. Patient denies any dysuria, hematuria or significant nocturia. Patient denies any problems walking, swelling in the joints or loss of balance. Patient denies any skin changes, loss of hair or loss of weight. Patient denies any excessive worrying or anxiety or significant depression. Patient denies any problems with insomnia. Patient denies excessive thirst, polyuria, polydipsia. Patient denies any swollen glands, patient denies easy bruising or easy bleeding. Patient denies any recent infections, allergies or URI. Patient "s visual fields have not changed significantly in recent time.   PHYSICAL EXAM: BP (!) 129/95 (BP Location: Left Arm, Patient Position: Sitting)   Pulse 93   Temp (!) 97.3 F (36.3 C) (Tympanic)   Resp 16   Wt 161 lb 1.6 oz (73.1 kg)   BMI 22.47 kg/m  Well-developed well-nourished patient in NAD. HEENT reveals PERLA, EOMI, discs not visualized.  Oral cavity is clear. No oral mucosal lesions are identified. Neck is clear without evidence of cervical or supraclavicular adenopathy. Lungs are clear to A&P. Cardiac examination is essentially unremarkable with regular rate and rhythm without murmur rub or thrill. Abdomen is benign with no organomegaly or masses noted. Motor sensory and DTR levels are equal and symmetric in the upper and lower extremities. Cranial nerves II through XII are grossly intact. Proprioception is intact. No peripheral adenopathy  or edema is identified. No motor or sensory levels are noted. Crude visual fields are within normal range.  LABORATORY DATA: Cytology report reviewed    RADIOLOGY RESULTS: PET/CT and CT scans reviewed MRI of brain to be reviewed to complete staging   IMPRESSION: Stage 3B non-small cell lung cancer left upper lobe in 48 year old male for concurrent chemoradiation  PLAN: At this time again based on his disease confined to his left hemithorax and ipsilateral mediastinal and hilar nodes I would offer radiation therapy with curative intent.  I would plan on delivering 7000 cGy using IMRT treatment planning and delivery.  I would use PET CT fusion study during treatment planning.  Risks and benefits of treatment including increased dysphagia fatigue alteration of blood counts skin reaction and loss of normal lung volume all were described in detail to the patient.  I have personally set up and ordered CT simulation.  There will be extra effort by both professional staff as well as technical staff to coordinate and manage concurrent chemoradiation and ensuing side effects during his treatments. Patient comprehends my treatment plan well.  I would like to take this opportunity to thank you for allowing  me to participate in the care of your patient.Noreene Filbert, MD

## 2019-12-02 NOTE — Progress Notes (Signed)
Patient is here today to start his first chemo therapy.

## 2019-12-03 ENCOUNTER — Other Ambulatory Visit: Payer: 59

## 2019-12-03 ENCOUNTER — Encounter: Payer: Self-pay | Admitting: *Deleted

## 2019-12-03 ENCOUNTER — Other Ambulatory Visit: Payer: Self-pay

## 2019-12-03 ENCOUNTER — Inpatient Hospital Stay: Payer: 59

## 2019-12-03 ENCOUNTER — Inpatient Hospital Stay: Payer: 59 | Admitting: Oncology

## 2019-12-03 VITALS — BP 128/84 | HR 77

## 2019-12-03 VITALS — BP 133/93 | HR 89 | Temp 97.9°F | Resp 16 | Wt 161.4 lb

## 2019-12-03 DIAGNOSIS — C3412 Malignant neoplasm of upper lobe, left bronchus or lung: Secondary | ICD-10-CM

## 2019-12-03 DIAGNOSIS — Z5111 Encounter for antineoplastic chemotherapy: Secondary | ICD-10-CM | POA: Diagnosis not present

## 2019-12-03 MED ORDER — SODIUM CHLORIDE 0.9 % IV SOLN
Freq: Once | INTRAVENOUS | Status: AC
  Administered 2019-12-03: 12:00:00 via INTRAVENOUS
  Filled 2019-12-03: qty 250

## 2019-12-03 MED ORDER — SODIUM CHLORIDE 0.9 % IV SOLN: 10.0000 mg | Freq: Once | INTRAVENOUS | Status: DC

## 2019-12-03 MED ORDER — PALONOSETRON HCL INJECTION 0.25 MG/5ML
0.2500 mg | Freq: Once | INTRAVENOUS | Status: AC
  Administered 2019-12-03: 0.25 mg via INTRAVENOUS
  Filled 2019-12-03: qty 5

## 2019-12-03 MED ORDER — SODIUM CHLORIDE 0.9 % IV SOLN
45.0000 mg/m2 | Freq: Once | INTRAVENOUS | Status: AC
  Administered 2019-12-03: 12:00:00 84 mg via INTRAVENOUS
  Filled 2019-12-03: qty 14

## 2019-12-03 MED ORDER — DIPHENHYDRAMINE HCL 50 MG/ML IJ SOLN
50.0000 mg | Freq: Once | INTRAMUSCULAR | Status: AC
  Administered 2019-12-03: 12:00:00 50 mg via INTRAVENOUS
  Filled 2019-12-03: qty 1

## 2019-12-03 MED ORDER — FAMOTIDINE IN NACL 20-0.9 MG/50ML-% IV SOLN
20.0000 mg | Freq: Once | INTRAVENOUS | Status: AC
  Administered 2019-12-03: 12:00:00 20 mg via INTRAVENOUS
  Filled 2019-12-03: qty 50

## 2019-12-03 MED ORDER — DEXAMETHASONE SODIUM PHOSPHATE 10 MG/ML IJ SOLN
10.0000 mg | Freq: Once | INTRAMUSCULAR | Status: AC
  Administered 2019-12-03: 12:00:00 10 mg via INTRAVENOUS
  Filled 2019-12-03: qty 1

## 2019-12-03 MED ORDER — SODIUM CHLORIDE 0.9 % IV SOLN
262.0000 mg | Freq: Once | INTRAVENOUS | Status: AC
  Administered 2019-12-03: 260 mg via INTRAVENOUS
  Filled 2019-12-03: qty 26

## 2019-12-03 NOTE — Progress Notes (Signed)
  Oncology Nurse Navigator Documentation  Navigator Location: [facility] (12/03/19 1500)   )Navigator Encounter Type: Treatment (12/03/19 1500)                   Treatment Initiated Date: 12/03/19 (12/03/19 1500) Patient Visit Type: MedOnc (12/03/19 1500) Treatment Phase: First Chemo Tx (12/03/19 1500) Barriers/Navigation Needs: Financial Toxicity (12/03/19 1500)   Interventions: Disability/FMLA;Referrals (12/03/19 1500) Referrals: Social Work (12/03/19 1500)           met with patient prior to receiving first chemo treatment. All question answered during visit. Received disability forms from patient and gave them to clinical team to complete. Pt voiced concerns about having a copay each time he is seen in the clinic. Informed pt that will see if Elease Etienne, SW can speak to him about how we can help him financially while he is going through treatments and not working. All upcoming appts reviewed with patient. Instructed pt to call with any further questions or needs. Pt verbalized understanding.          Time Spent with Patient: 60 (12/03/19 1500)

## 2019-12-04 ENCOUNTER — Other Ambulatory Visit: Payer: Self-pay

## 2019-12-04 ENCOUNTER — Encounter: Payer: Self-pay | Admitting: *Deleted

## 2019-12-04 ENCOUNTER — Ambulatory Visit
Admission: RE | Admit: 2019-12-04 | Discharge: 2019-12-04 | Disposition: A | Discharge status: Home / Self Care | Payer: 59 | Source: Ambulatory Visit | Attending: Radiation Oncology | Admitting: Radiation Oncology

## 2019-12-04 ENCOUNTER — Other Ambulatory Visit: Payer: Self-pay | Admitting: Physician Assistant

## 2019-12-04 ENCOUNTER — Telehealth: Payer: Self-pay

## 2019-12-04 DIAGNOSIS — C3411 Malignant neoplasm of upper lobe, right bronchus or lung: Secondary | ICD-10-CM | POA: Insufficient documentation

## 2019-12-04 DIAGNOSIS — Z510 Encounter for antineoplastic radiation therapy: Secondary | ICD-10-CM | POA: Diagnosis not present

## 2019-12-04 NOTE — Telephone Encounter (Signed)
Telephone call to patient for follow up after receiving first chemo yesterday.  No answer but left message stating I was calling to see how his first infusion went and to see how he is feeling.   Encouraged patient to call for any questions or concerns.

## 2019-12-05 ENCOUNTER — Other Ambulatory Visit: Payer: Self-pay | Admitting: Oncology

## 2019-12-05 ENCOUNTER — Ambulatory Visit (HOSPITAL_COMMUNITY)
Admission: RE | Admit: 2019-12-05 | Discharge: 2019-12-05 | Disposition: A | Discharge status: Home / Self Care | Payer: 59 | Source: Ambulatory Visit | Attending: Oncology | Admitting: Oncology

## 2019-12-05 ENCOUNTER — Encounter (HOSPITAL_COMMUNITY): Payer: Self-pay

## 2019-12-05 ENCOUNTER — Ambulatory Visit (HOSPITAL_COMMUNITY)
Admission: RE | Admit: 2019-12-05 | Discharge: 2019-12-05 | Disposition: A | Discharge status: Home / Self Care | Payer: 59 | Source: Ambulatory Visit | Attending: Diagnostic Radiology | Admitting: Diagnostic Radiology

## 2019-12-05 DIAGNOSIS — R918 Other nonspecific abnormal finding of lung field: Secondary | ICD-10-CM

## 2019-12-05 DIAGNOSIS — Z87891 Personal history of nicotine dependence: Secondary | ICD-10-CM | POA: Insufficient documentation

## 2019-12-05 DIAGNOSIS — Z79899 Other long term (current) drug therapy: Secondary | ICD-10-CM | POA: Diagnosis not present

## 2019-12-05 DIAGNOSIS — Z9889 Other specified postprocedural states: Secondary | ICD-10-CM

## 2019-12-05 DIAGNOSIS — C782 Secondary malignant neoplasm of pleura: Secondary | ICD-10-CM | POA: Insufficient documentation

## 2019-12-05 DIAGNOSIS — R222 Localized swelling, mass and lump, trunk: Secondary | ICD-10-CM | POA: Insufficient documentation

## 2019-12-05 DIAGNOSIS — C3412 Malignant neoplasm of upper lobe, left bronchus or lung: Secondary | ICD-10-CM | POA: Insufficient documentation

## 2019-12-05 DIAGNOSIS — C771 Secondary and unspecified malignant neoplasm of intrathoracic lymph nodes: Secondary | ICD-10-CM | POA: Diagnosis not present

## 2019-12-05 HISTORY — PX: IR IMAGING GUIDED PORT INSERTION: IMG5740

## 2019-12-05 LAB — CBC WITH DIFFERENTIAL/PLATELET
Abs Immature Granulocytes: 0.04 10*3/uL (ref 0.00–0.07)
Basophils Absolute: 0 10*3/uL (ref 0.0–0.1)
Basophils Relative: 1 %
Eosinophils Absolute: 0 10*3/uL (ref 0.0–0.5)
Eosinophils Relative: 1 %
HCT: 41.4 % (ref 39.0–52.0)
Hemoglobin: 13.4 g/dL (ref 13.0–17.0)
Immature Granulocytes: 1 %
Lymphocytes Relative: 26 %
Lymphs Abs: 1.5 10*3/uL (ref 0.7–4.0)
MCH: 28.5 pg (ref 26.0–34.0)
MCHC: 32.4 g/dL (ref 30.0–36.0)
MCV: 88.1 fL (ref 80.0–100.0)
Monocytes Absolute: 0.6 10*3/uL (ref 0.1–1.0)
Monocytes Relative: 10 %
Neutro Abs: 3.7 10*3/uL (ref 1.7–7.7)
Neutrophils Relative %: 61 %
Platelets: 657 10*3/uL — ABNORMAL HIGH (ref 150–400)
RBC: 4.7 MIL/uL (ref 4.22–5.81)
RDW: 14.1 % (ref 11.5–15.5)
WBC: 5.9 10*3/uL (ref 4.0–10.5)
nRBC: 0 % (ref 0.0–0.2)

## 2019-12-05 LAB — PROTIME-INR
INR: 1.1 (ref 0.8–1.2)
Prothrombin Time: 13.6 seconds (ref 11.4–15.2)

## 2019-12-05 IMAGING — CT CT BIOPSY
1 of 2 series · 13 of 32 positions shown, 19 images · non-contrast
Comparison: none

INDICATION: 48-year-old with lung cancer but needs additional tissue for
treatment planning.

[Series 2: i-spiral 5.0 b40f · axial · 0.88mm/px · z∈[-221,-109]mm · 13 of 38 slices shown, 19 images]
[im 3/38  soft-tissue]
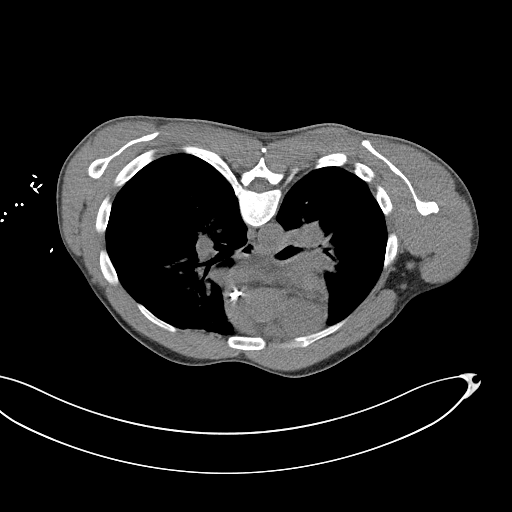
[im 3/38  bone]
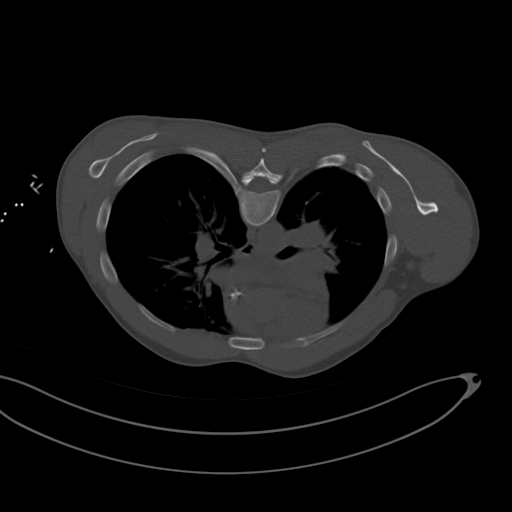
[im 6/38  soft-tissue]
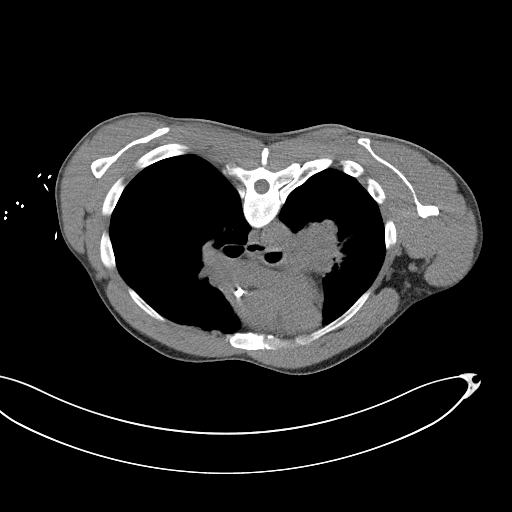
[im 8/38  soft-tissue]
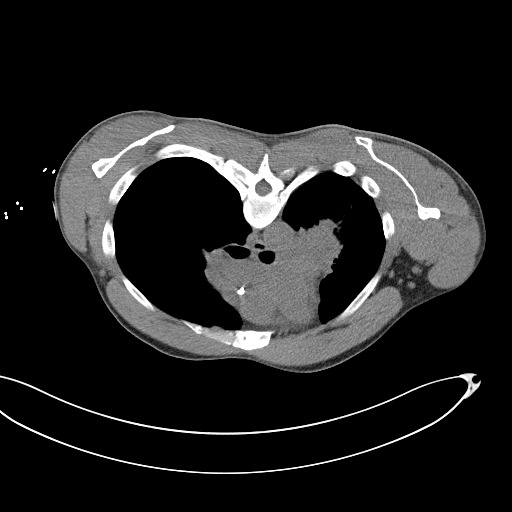
[im 11/38  soft-tissue]
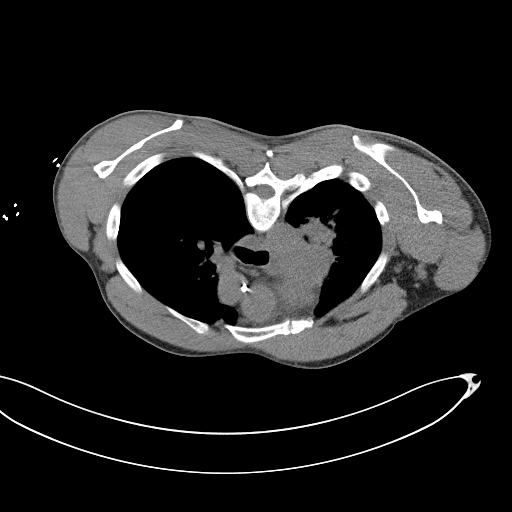
[im 14/38  soft-tissue]
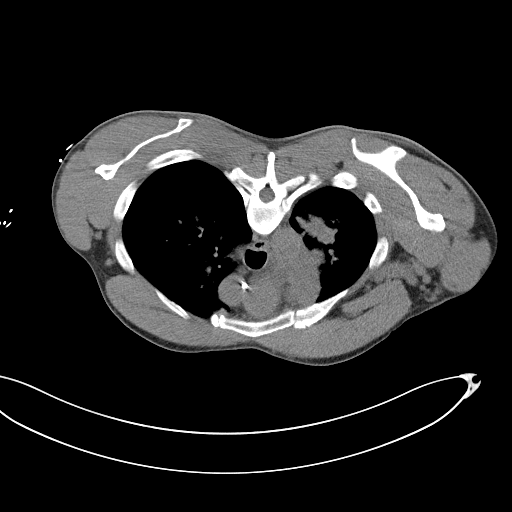
[im 16/38  soft-tissue]
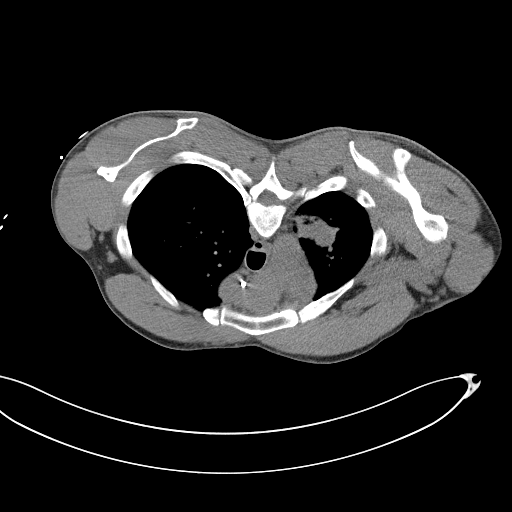
[im 19/38  soft-tissue]
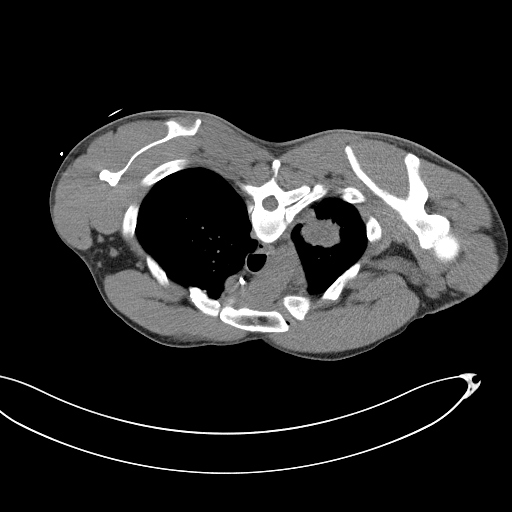
[im 22/38  soft-tissue]
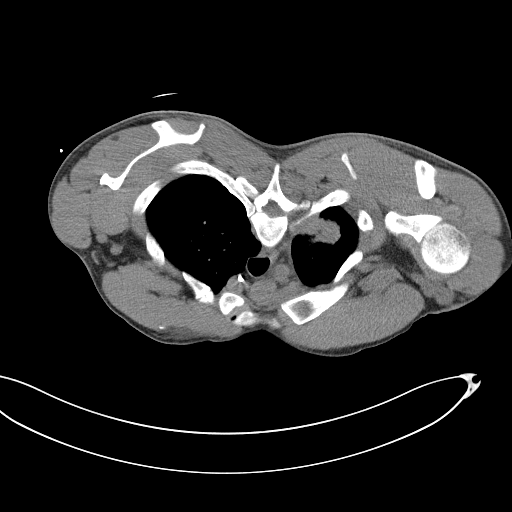
[im 24/38  soft-tissue]
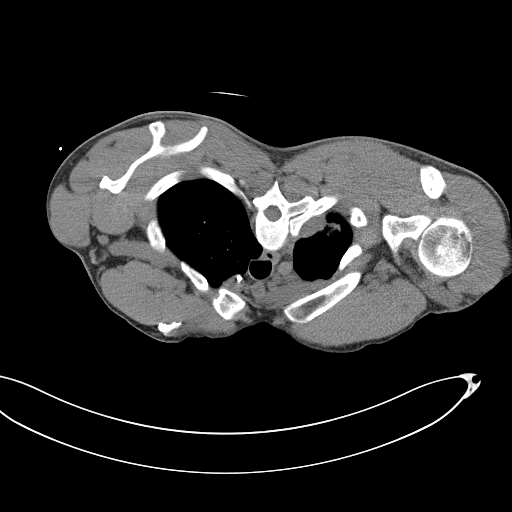
[im 24/38  bone]
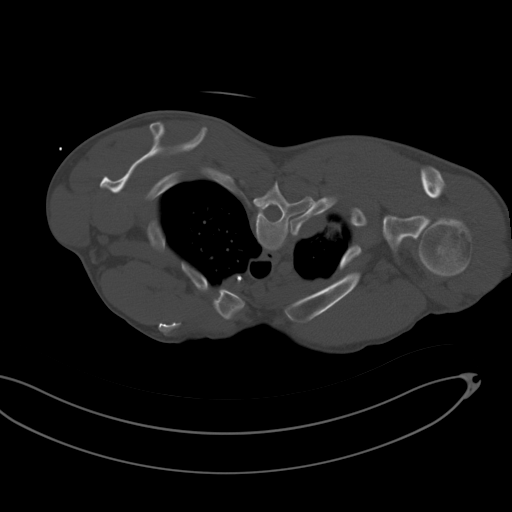
[im 27/38  soft-tissue]
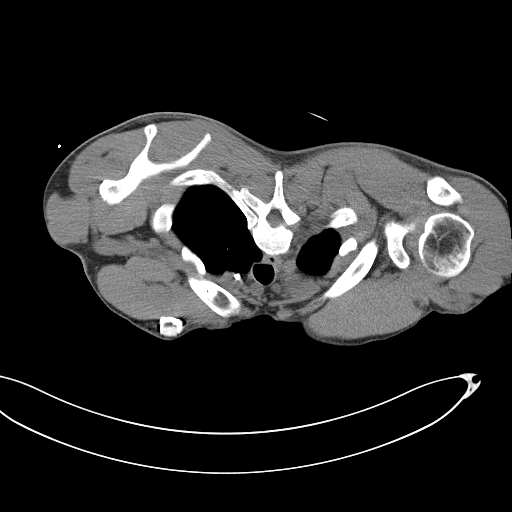
[im 27/38  lung]
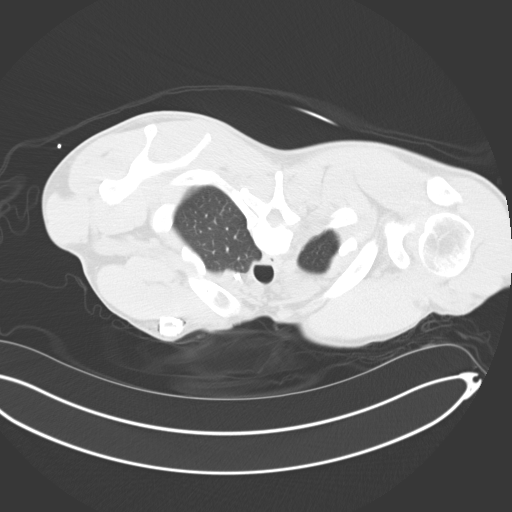
[im 30/38  soft-tissue]
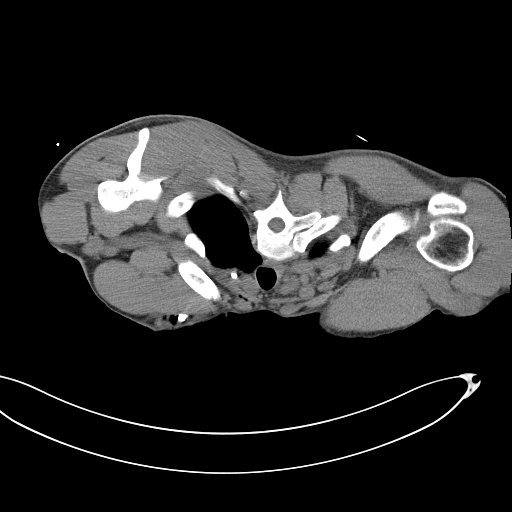
[im 30/38  lung]
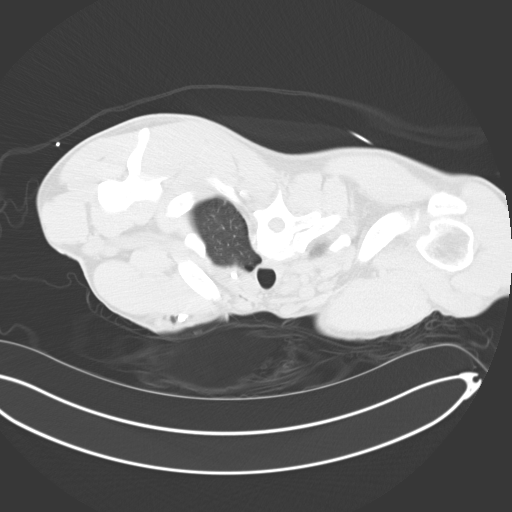
[im 32/38  soft-tissue]
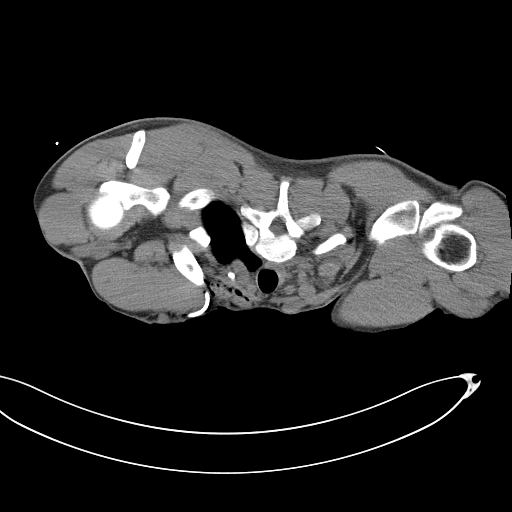
[im 32/38  lung]
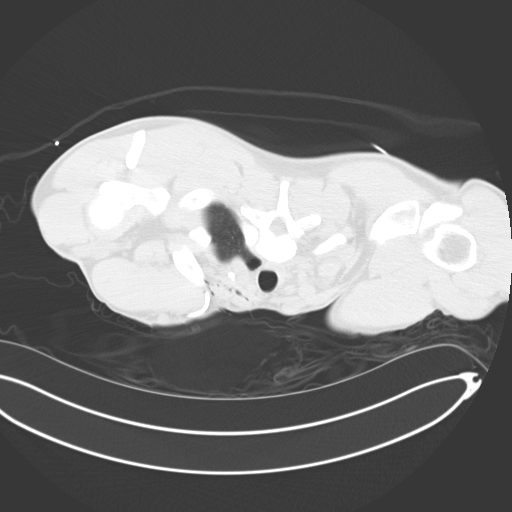
[im 35/38  soft-tissue]
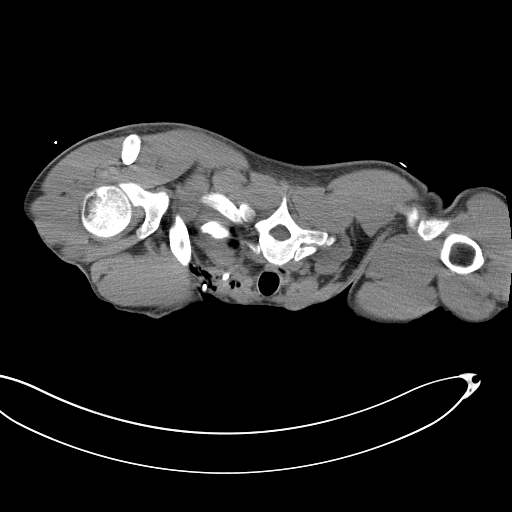
[im 35/38  lung]
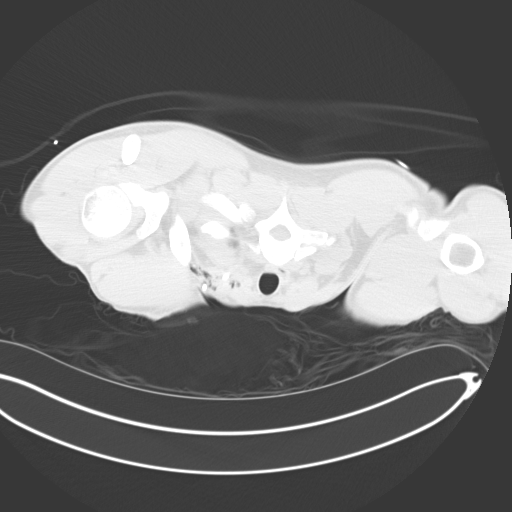

[13 of 32 positions shown; findings below may reference images not displayed]

EXAM:
CT-GUIDED LEFT LUNG MASS BIOPSY

MEDICATIONS:
None.

ANESTHESIA/SEDATION:
Moderate (conscious) sedation was employed during this procedure. A
total of Versed 1.5 mg and Fentanyl 75 mcg was administered
intravenously.

Moderate Sedation Time: 18 minutes. The patient's level of
consciousness and vital signs were monitored continuously by
radiology nursing throughout the procedure under my direct
supervision.

FLUOROSCOPY TIME:  None

COMPLICATIONS:
None immediate.

PROCEDURE:
Informed written consent was obtained from the patient after a
thorough discussion of the procedural risks, benefits and
alternatives. All questions were addressed. A timeout was performed
prior to the initiation of the procedure.

Patient was placed prone. CT images through the upper chest were
obtained. Mass in the posterior left upper lobe was targeted for
biopsy. Left side of the back was prepped with chlorhexidine and
sterile field was created. Skin and soft tissues were anesthetized
with 1% lidocaine. Using CT guidance, a 17 gauge coaxial needle was
directed into the lateral aspect of the lesion. 18 gauge core
biopsies were obtained. Specimens placed in formalin. Needle was
removed using a Bio[employer] tract sealant. Bandage placed over the
puncture site.
FINDINGS: Spiculated mass in the left upper lobe. Needle was directed into the
lateral aspect of the mass because this area was hypermetabolic on
previous PET-CT. Adequate core specimens were obtained. No
significant pneumothorax on the post biopsy images.
IMPRESSION: Successful CT-guided core biopsies of the left upper lobe lung mass.

## 2019-12-05 IMAGING — CR DG CHEST 1V PORT
1 series · 1 of 1 positions shown · non-contrast
Comparison: [DATE]

CLINICAL DATA: Status post port placement

EXAM:
PORTABLE CHEST 1 VIEW

[AP]
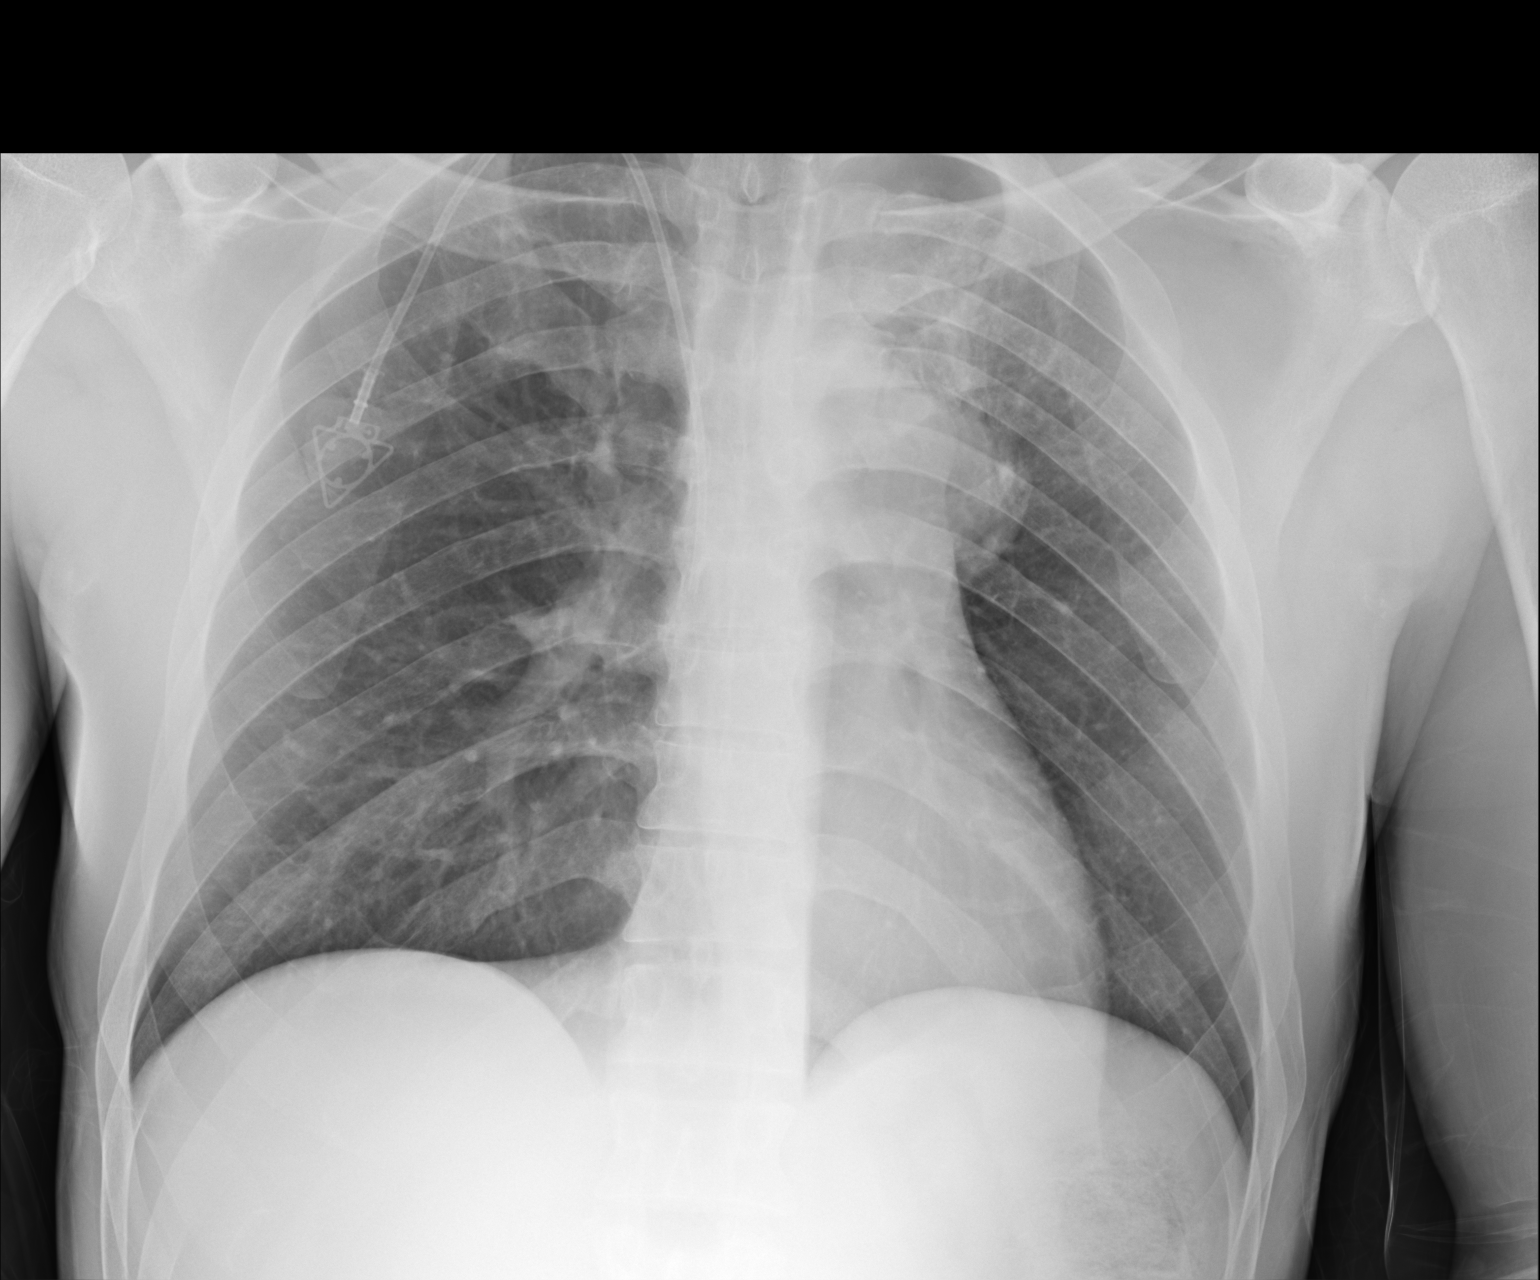

[1 of 1 positions shown; findings below may reference images not displayed]

FINDINGS: There is a right-sided Port-A-Cath with the tip projecting over the
SVC. There is no focal consolidation. There is no pleural effusion
or pneumothorax. There is a left suprahilar mass. The heart size is
normal.

There is no acute osseous abnormality.
IMPRESSION: 1. Right-sided Port-A-Cath in satisfactory position.
2. Left suprahilar mass most concerning for malignancy.

## 2019-12-05 IMAGING — US IR IMAGING GUIDED PORT INSERTION
2 series · 3 of 3 positions shown · non-contrast
Comparison: None.

INDICATION: 48-year-old with lung cancer.  Port-A-Cath needed for chemotherapy.

EXAM:
FLUOROSCOPIC AND ULTRASOUND GUIDED PLACEMENT OF A SUBCUTANEOUS PORT

[Series 1: body 4 care · 1 of 1 slices shown]
[im 1/1]
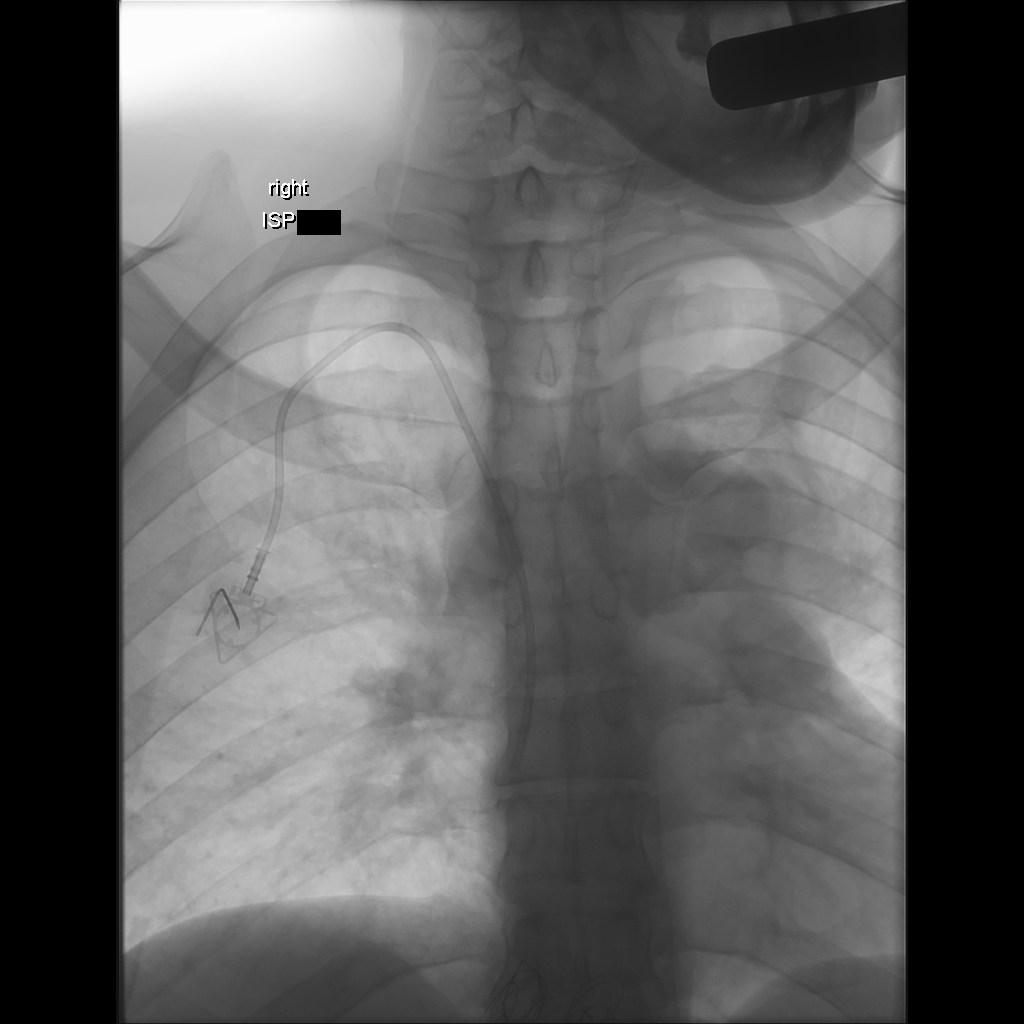

[Series 1: ir imaging guided port insertion · 2 of 2 slices shown]
[im 1/2]
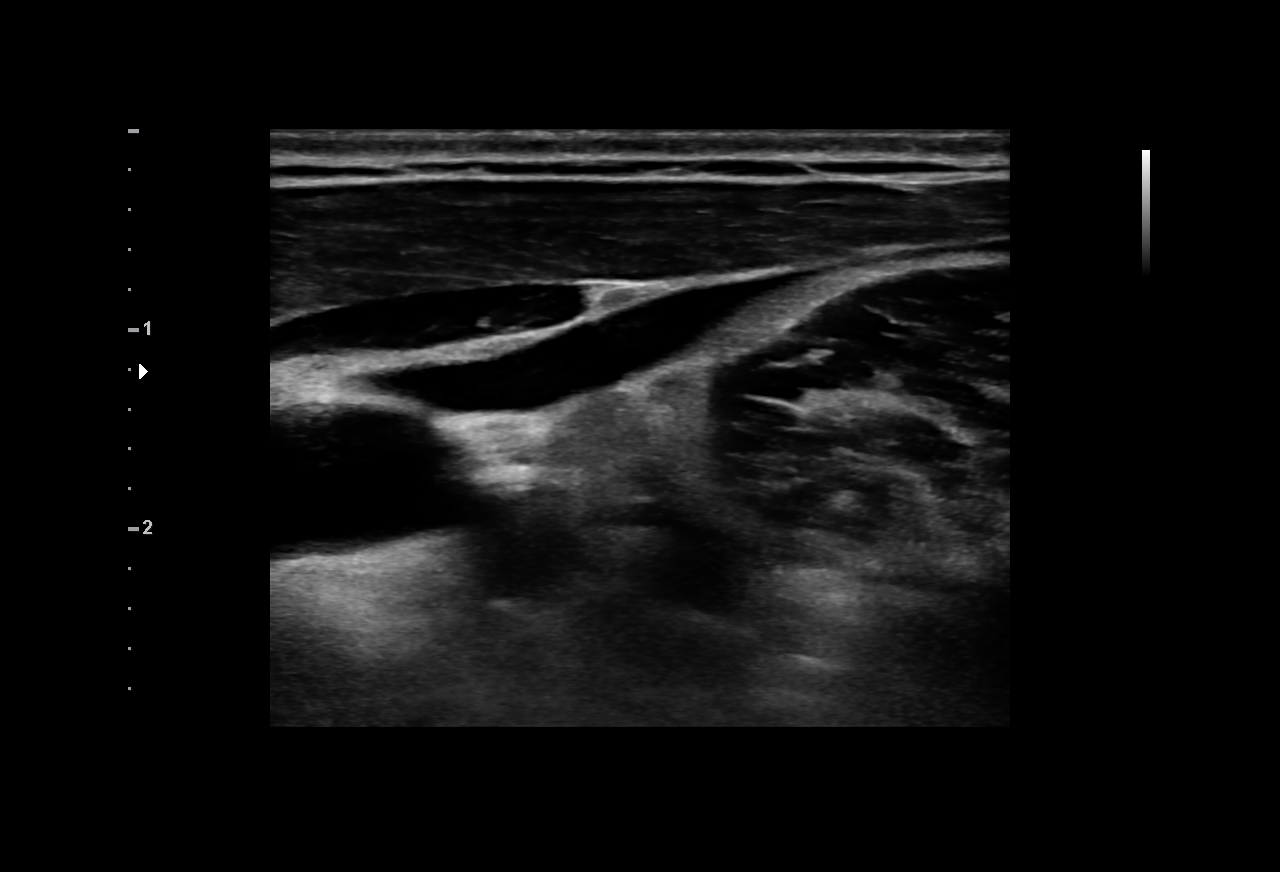
[im 2/2]
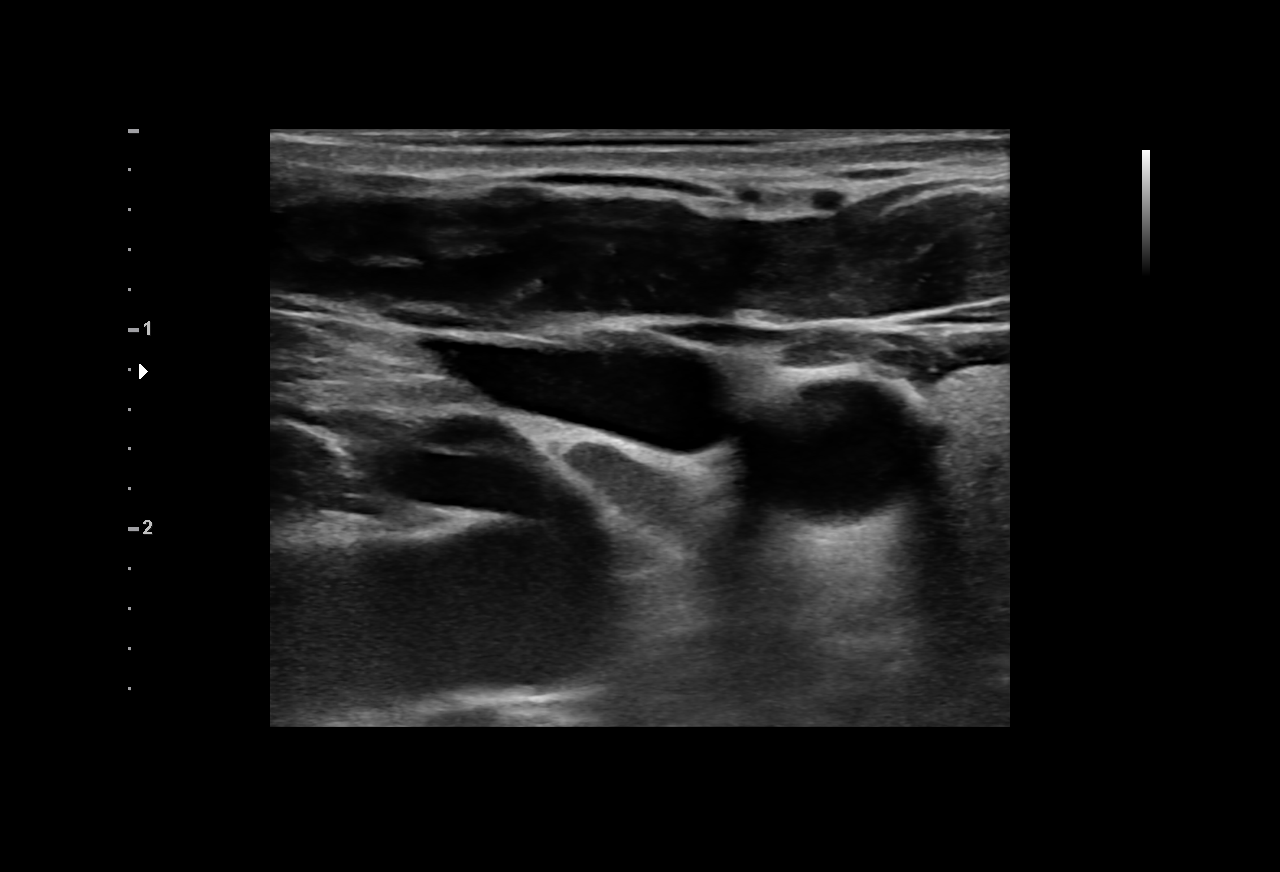

[3 of 3 positions shown; findings below may reference images not displayed]

MEDICATIONS:
Ancef 2 g; The antibiotic was administered within an appropriate
time interval prior to skin puncture.

ANESTHESIA/SEDATION:
Versed 1.5 mg IV; Fentanyl 75 mcg IV;

Moderate Sedation Time:  33 minutes

The patient was continuously monitored during the procedure by the
interventional radiology nurse under my direct supervision.

FLUOROSCOPY TIME:  24 seconds, 1 mGy

COMPLICATIONS:
None immediate.

PROCEDURE:
The procedure, risks, benefits, and alternatives were explained to
the patient. Questions regarding the procedure were encouraged and
answered. The patient understands and consents to the procedure.

Patient was placed supine on the interventional table. Ultrasound
confirmed a patent right internal jugular vein. Ultrasound image was
saved for documentation. The right chest and neck were cleaned with
a skin antiseptic and a sterile drape was placed. Maximal barrier
sterile technique was utilized including caps, mask, sterile gowns,
sterile gloves, sterile drape, hand hygiene and skin antiseptic. The
right neck was anesthetized with 1% lidocaine. Small incision was
made in the right neck with a blade. Micropuncture set was placed in
the right internal jugular vein with ultrasound guidance. The
micropuncture wire was used for measurement purposes. The right
chest was anesthetized with 1% lidocaine with epinephrine. #15 blade
was used to make an incision and a subcutaneous port pocket was
formed. 8 french Power Port was assembled. Subcutaneous tunnel was
formed with a stiff tunneling device. The port catheter was brought
through the subcutaneous tunnel. The port was placed in the
subcutaneous pocket. The micropuncture set was exchanged for a
peel-away sheath. The catheter was placed through the peel-away
sheath and the tip was positioned at the SVC and right atrium
junction. Catheter placement was confirmed with fluoroscopy. The
port was accessed and flushed with heparinized saline. The port
pocket was closed using two layers of absorbable sutures and
Dermabond. The vein skin site was closed using a single layer of
absorbable suture and Dermabond. Sterile dressings were applied.
Patient tolerated the procedure well without an immediate
complication. Ultrasound and fluoroscopic images were taken and
saved for this procedure.
IMPRESSION: Placement of a subcutaneous port device. Catheter tip at the SVC and
right atrium junction.

## 2019-12-05 MED ORDER — LIDOCAINE HCL (PF) 1 % IJ SOLN
INTRAMUSCULAR | Status: DC | PRN
  Administered 2019-12-05: 20 mL

## 2019-12-05 MED ORDER — FENTANYL CITRATE (PF) 100 MCG/2ML IJ SOLN
INTRAMUSCULAR | Status: AC | PRN
  Administered 2019-12-05 (×3): 25 ug via INTRAVENOUS

## 2019-12-05 MED ORDER — FENTANYL CITRATE (PF) 100 MCG/2ML IJ SOLN
INTRAMUSCULAR | Status: AC | PRN
  Administered 2019-12-05: 50 ug via INTRAVENOUS
  Administered 2019-12-05: 25 ug via INTRAVENOUS

## 2019-12-05 MED ORDER — MIDAZOLAM HCL 2 MG/2ML IJ SOLN
INTRAMUSCULAR | Status: AC
  Filled 2019-12-05: qty 4

## 2019-12-05 MED ORDER — SODIUM CHLORIDE 0.9 % IV SOLN: INTRAVENOUS | Status: DC

## 2019-12-05 MED ORDER — CEFAZOLIN SODIUM-DEXTROSE 2-4 GM/100ML-% IV SOLN
INTRAVENOUS | Status: AC
  Filled 2019-12-05: qty 100

## 2019-12-05 MED ORDER — LIDOCAINE-EPINEPHRINE 1 %-1:100000 IJ SOLN
30.0000 mL | Freq: Once | INTRAMUSCULAR | Status: DC
  Filled 2019-12-05 (×2): qty 30

## 2019-12-05 MED ORDER — LIDOCAINE HCL 1 % IJ SOLN
INTRAMUSCULAR | Status: AC
  Filled 2019-12-05: qty 20

## 2019-12-05 MED ORDER — CEFAZOLIN SODIUM-DEXTROSE 2-4 GM/100ML-% IV SOLN
2.0000 g | Freq: Once | INTRAVENOUS | Status: AC
  Administered 2019-12-05: 2 g via INTRAVENOUS

## 2019-12-05 MED ORDER — MIDAZOLAM HCL 2 MG/2ML IJ SOLN
INTRAMUSCULAR | Status: AC | PRN
  Administered 2019-12-05: 1 mg via INTRAVENOUS
  Administered 2019-12-05: 0.5 mg via INTRAVENOUS

## 2019-12-05 MED ORDER — HEPARIN SOD (PORK) LOCK FLUSH 100 UNIT/ML IV SOLN
INTRAVENOUS | Status: AC
  Filled 2019-12-05: qty 5

## 2019-12-05 MED ORDER — MIDAZOLAM HCL 2 MG/2ML IJ SOLN
INTRAMUSCULAR | Status: AC | PRN
  Administered 2019-12-05 (×3): 0.5 mg via INTRAVENOUS

## 2019-12-05 MED ORDER — FENTANYL CITRATE (PF) 100 MCG/2ML IJ SOLN
INTRAMUSCULAR | Status: AC
  Filled 2019-12-05: qty 4

## 2019-12-05 NOTE — Procedures (Signed)
Interventional Radiology Procedure:   Indications: Left lung cancer and needs for more tissue for treatment planning.  Procedure: CT guided left lung lesion biopsy  Findings: 3 cores from lesion.  No immediate pneumothorax  Complications: None     EBL: less than 10 ml  Plan: CXR in 1 hour and then plan to discharge to home later today.     Emberley Kral R. Anselm Pancoast, MD  Pager: [phone]

## 2019-12-05 NOTE — Progress Notes (Signed)
Hematology/Oncology Consult note [facility]  Telephone:([phone] [phone]  Patient Care Team: Patient, No Pcp Per as PCP - General ([place]) Telford Nab, RN as Registered Nurse   Name of the patient: Kyle Guerrero  [id number]  04-23-1971   Date of visit: 12/05/19  Diagnosis- Non-small cell lung cancer stage IV acT2 cN2 cM1 a with pleural involvement   Chief complaint/ Reason for visit-on treatment assessment prior to cycle 1 of weekly carbotaxol chemotherapy  Heme/Onc history: patient is a 48 year old male who presented to the ER with symptoms ofheaviness in his chest and upper left chest discomfort he underwent CT angio chest to rule out PE which showed 3.8 x 3.3 cm left upper palpable lung mass along with 4.4 x 3.3 cm lobulated mass in the aortopulmonary window and 2.6 x 2.4 cm left hilar mass all concerning for malignancy. Patient has also seen pulmonary and has been set up for bronchoscopy and EBUS guided biopsy on 11/23/2019. Patient underwent. PET CT scan which showed a hypermetabolic spiculated 3.5 cm of 5 left upper lobe lung mass, adjacent hypermetabolic 3.2 x 1.2 cm pleural metastases in the medial posterior by the left pleural space along with scalloping of the adjacent posterior left third rib. Hypermetabolic infiltrative left perihilar conglomerate nodal metastases measuring up to 7.3 x 3.6 cm and 0.8 cm high left mediastinal node between the left brachiocephalic vein and left subclavian artery. No evidence of distant metastatic disease  Biopsy showed non-small cell lung cancer but further characterization could not be determined.  Insufficient tissue for NGS testing  Interval history-reports some shortness of breath on exertion.  He has not had any recurrence of hemoptysis.  ECOG PS- 1 Pain scale- 0 Opioid associated constipation- no  Review of systems- Review of Systems  Constitutional: Positive for  malaise/fatigue. Negative for chills, fever and weight loss.  HENT: Negative for congestion, ear discharge and nosebleeds.   Eyes: Negative for blurred vision.  Respiratory: Positive for shortness of breath. Negative for cough, hemoptysis, sputum production and wheezing.   Cardiovascular: Negative for chest pain, palpitations, orthopnea and claudication.  Gastrointestinal: Negative for abdominal pain, blood in stool, constipation, diarrhea, heartburn, melena, nausea and vomiting.  Genitourinary: Negative for dysuria, flank pain, frequency, hematuria and urgency.  Musculoskeletal: Negative for back pain, joint pain and myalgias.  Skin: Negative for rash.  Neurological: Negative for dizziness, tingling, focal weakness, seizures, weakness and headaches.  Endo/Heme/Allergies: Does not bruise/bleed easily.  Psychiatric/Behavioral: Negative for depression and suicidal ideas. The patient does not have insomnia.     No Known Allergies   History reviewed. No pertinent past medical history.   Past Surgical History:  Procedure Laterality Date  . CYST EXCISION    . IR IMAGING GUIDED PORT INSERTION  12/05/2019  . VIDEO BRONCHOSCOPY WITH ENDOBRONCHIAL ULTRASOUND Left 11/22/2019   Procedure: VIDEO BRONCHOSCOPY WITH ENDOBRONCHIAL ULTRASOUND;  Surgeon: Tyler Pita, MD;  Location: [facility] ORS;  Service: Thoracic;  Laterality: Left;    Social History   Socioeconomic History  . Marital status: Single    Spouse name: Not on file  . Number of children: Not on file  . Years of education: Not on file  . Highest education level: Not on file  Occupational History  . Not on file  Tobacco Use  . Smoking status: Former Smoker    Packs/day: 2.00    Types: Cigarettes    Quit date: 11/08/2019    Years since quitting: 0.0  . Smokeless  tobacco: Never Used  Substance and Sexual Activity  . Alcohol use: Yes  . Drug use: Yes    Types: Marijuana  . Sexual activity: Not on file  Other Topics Concern   . Not on file  Social History Narrative  . Not on file   Social Determinants of Health   Financial Resource Strain:   . Difficulty of Paying Living Expenses: Not on file  Food Insecurity:   . Worried About Charity fundraiser in the Last Year: Not on file  . Ran Out of Food in the Last Year: Not on file  Transportation Needs:   . Lack of Transportation (Medical): Not on file  . Lack of Transportation (Non-Medical): Not on file  Physical Activity:   . Days of Exercise per Week: Not on file  . Minutes of Exercise per Session: Not on file  Stress:   . Feeling of Stress : Not on file  Social Connections:   . Frequency of Communication with Friends and Family: Not on file  . Frequency of Social Gatherings with Friends and Family: Not on file  . Attends Religious Services: Not on file  . Active Member of Clubs or Organizations: Not on file  . Attends Archivist Meetings: Not on file  . Marital Status: Not on file  Intimate Partner Violence:   . Fear of Current or Ex-Partner: Not on file  . Emotionally Abused: Not on file  . Physically Abused: Not on file  . Sexually Abused: Not on file    Family History  Problem Relation Age of Onset  . Healthy Mother   . Healthy Father      Current Outpatient Medications:  .  azelastine (ASTELIN) 0.1 % nasal spray, Place 1 spray into the nose 2 (two) times daily., Disp: , Rfl:  .  dexamethasone (DECADRON) 4 MG tablet, Take 2 tablets (8 mg total) by mouth daily. Start the day after chemotherapy for 2 days. (Patient not taking: Reported on 12/02/2019), Disp: 30 tablet, Rfl: 1 .  lidocaine-prilocaine (EMLA) cream, Apply to affected area once (Patient not taking: Reported on 12/02/2019), Disp: 30 g, Rfl: 3 .  ondansetron (ZOFRAN) 8 MG tablet, Take 1 tablet (8 mg total) by mouth 2 (two) times daily as needed for refractory nausea / vomiting. Start on day 3 after chemo. (Patient not taking: Reported on 12/02/2019), Disp: 30 tablet, Rfl: 1 .   prochlorperazine (COMPAZINE) 10 MG tablet, Take 1 tablet (10 mg total) by mouth every 6 (six) hours as needed (Nausea or vomiting). (Patient not taking: Reported on 12/02/2019), Disp: 30 tablet, Rfl: 1 No current facility-administered medications for this visit.  Facility-Administered Medications Ordered in Other Visits:  .  0.9 %  sodium chloride infusion, , Intravenous, Continuous, Ardis Rowan, PA-C .  ceFAZolin (ANCEF) 2-4 GM/100ML-% IVPB, , , ,  .  fentaNYL (SUBLIMAZE) 100 MCG/2ML injection, , , ,  .  heparin lock flush 100 UNIT/ML injection, , , ,  .  lidocaine (PF) (XYLOCAINE) 1 % injection, , , PRN, Markus Daft, MD, 20 mL at 12/05/19 0840 .  lidocaine (XYLOCAINE) 1 % (with pres) injection, , , ,  .  lidocaine-EPINEPHrine (XYLOCAINE W/EPI) 1 %-1:100000 (with pres) injection 30 mL, 30 mL, Infiltration, Once, Markus Daft, MD .  midazolam (VERSED) 2 MG/2ML injection, , , ,   Physical exam:  Vitals:   12/03/19 1043  BP: (!) 133/93  Pulse: 89  Resp: 16  Temp: 97.9 F (36.6 C)  TempSrc: Tympanic  SpO2: 100%  Weight: 161 lb 6.4 oz (73.2 kg)   Physical Exam Constitutional:      General: He is not in acute distress. HENT:     Head: Normocephalic and atraumatic.  Eyes:     Pupils: Pupils are equal, round, and reactive to light.  Cardiovascular:     Rate and Rhythm: Normal rate and regular rhythm.     Heart sounds: Normal heart sounds.  Pulmonary:     Effort: Pulmonary effort is normal.     Breath sounds: Normal breath sounds.  Abdominal:     General: Bowel sounds are normal.     Palpations: Abdomen is soft.  Musculoskeletal:     Cervical back: Normal range of motion.  Skin:    General: Skin is warm and dry.  Neurological:     Mental Status: He is alert and oriented to person, place, and time.      CMP Latest Ref Rng & Units 12/02/2019  Glucose 70 - 99 mg/dL 98  BUN 6 - 20 mg/dL 12  Creatinine 0.61 - 1.24 mg/dL 0.91  Sodium 135 - 145 mmol/L 133(L)  Potassium  3.5 - 5.1 mmol/L 3.9  Chloride 98 - 111 mmol/L 99  CO2 22 - 32 mmol/L 27  Calcium 8.9 - 10.3 mg/dL 9.0  Total Protein 6.5 - 8.1 g/dL 8.4(H)  Total Bilirubin 0.3 - 1.2 mg/dL 0.4  Alkaline Phos 38 - 126 U/L 160(H)  AST 15 - 41 U/L 16  ALT 0 - 44 U/L 27   CBC Latest Ref Rng & Units 12/05/2019  WBC 4.0 - 10.5 K/uL 5.9  Hemoglobin 13.0 - 17.0 g/dL 13.4  Hematocrit 39.0 - 52.0 % 41.4  Platelets 150 - 400 K/uL 657(H)    No images are attached to the encounter.  MR Brain W Wo Contrast  Result Date: 12/02/2019 CLINICAL DATA:  Lung mass. EXAM: MRI HEAD WITHOUT AND WITH CONTRAST TECHNIQUE: Multiplanar, multiecho pulse sequences of the brain and surrounding structures were obtained without and with intravenous contrast. CONTRAST:  53mL GADAVIST GADOBUTROL 1 MMOL/ML IV SOLN COMPARISON:  Nuclear medicine PET scan 11/18/2019 FINDINGS: Brain: There is no evidence of acute infarct. No midline shift or extra-axial fluid collection. No chronic intracranial blood products. Small foci of cortical encephalomalacia within the anterior left frontal lobe and left frontal operculum, which may reflect chronic infarcts (series 15, image 37) (series 15, image 30). Mild scattered T2/FLAIR hyperintensity within the cerebral white matter is nonspecific, but consistent with chronic small vessel ischemic disease. Cerebral volume is normal for age. Small developmental venous anomaly within the left cerebellum. No other abnormal intracranial enhancement is demonstrated to suggest intracranial metastatic disease. Vascular: Flow voids maintained within the proximal large arterial vessels. Skull and upper cervical spine: There is an enhancing lesion within the midline frontal calvarium, measuring 0.8 x 1.1 x 1.0 cm (series 18, image 100) (series 20, image 13). Associated scalloping of the calvarium at this site. No other focal marrow lesion identified. Sinuses/Orbits: Visualized orbits demonstrate no acute abnormality. Minimal mucosal  thickening within bilateral ethmoid and right maxillary sinuses. No significant mastoid effusion. IMPRESSION: 1. 1.1 cm enhancing lesion within the midline frontal calvarium, which may reflect a calvarial metastasis or intra-diploic meningioma. Attention recommended on follow-up. 2. No evidence of intracranial metastatic disease. 3. Small foci of cortical encephalomalacia within the left frontal lobe, which may reflect chronic infarcts. 4. Mild chronic small vessel ischemic disease. Electronically Signed   By: Kellie Simmering DO  On: 12/02/2019 20:52   NM PET Image Initial (PI) Skull Base To Thigh  Result Date: 11/18/2019 CLINICAL DATA:  Initial treatment strategy for left apical lung mass. EXAM: NUCLEAR MEDICINE PET SKULL BASE TO THIGH TECHNIQUE: 9.0 mCi F-18 FDG was injected intravenously. Full-ring PET imaging was performed from the skull base to thigh after the radiotracer. CT data was obtained and used for attenuation correction and anatomic localization. Fasting blood glucose: 104 mg/dl COMPARISON:  11/04/2019 chest CT angiogram. FINDINGS: Mediastinal blood pool activity: SUV max 1.9 Liver activity: SUV max NA NECK: No hypermetabolic lymph nodes in the neck. Incidental CT findings: none CHEST: Spiculated solid 3.5 cm apical left upper lobe lung mass with max SUV 12.9 (series 3/image 79). Adjacent plaque-like hypermetabolic 3.2 x 1.2 cm mass centered in the posterior medial apical left pleural space with max SUV 13.6 (series 3/image 73), with slight scalloping of the adjacent posterior left third rib. Hypermetabolic infiltrative solid 7.3 x 3.6 cm left upper perihilar mass with max SUV 19.2 (series 3/image 93), extending into the AP window and left hilum. Hypermetabolic 0.8 cm high left mediastinal node between the left brachiocephalic vein and left subclavian artery with max SUV 12.7 (series 3/image 74). No enlarged or hypermetabolic contralateral mediastinal or contralateral hilar lymph nodes. No enlarged  or hypermetabolic axillary lymph nodes. Incidental CT findings: Anterior right lower lobe 3 mm pulmonary nodule (series 3/image 90), below PET resolution. ABDOMEN/PELVIS: No abnormal hypermetabolic activity within the liver, pancreas, adrenal glands, or spleen. No hypermetabolic lymph nodes in the abdomen or pelvis. Incidental CT findings: Minimally atherosclerotic nonaneurysmal abdominal aorta. SKELETON: No focal hypermetabolic activity to suggest skeletal metastasis. Incidental CT findings: none IMPRESSION: 1. Hypermetabolic spiculated 3.5 cm apical left upper lobe lung mass, compatible with primary bronchogenic carcinoma. 2. Adjacent hypermetabolic 3.2 x 1.2 cm pleural metastasis in the medial posterior apical left pleural space with scalloping of the adjacent posterior left third rib. 3. Hypermetabolic infiltrative upper left perihilar conglomerate nodal metastasis extending into the AP window and left hilum. 4. Hypermetabolic ipsilateral high left mediastinal nodal metastasis. No hypermetabolic contralateral mediastinal or contralateral hilar metastases. 5. Tiny 3 mm right lower lobe pulmonary nodule, below PET resolution, recommend attention on follow-up chest CT in 3 months. 6. No hypermetabolic extrathoracic or osseous metastases. 7.  Aortic Atherosclerosis (ICD10-I70.0). Electronically Signed   By: Ilona Sorrel M.D.   On: 11/18/2019 15:01   DG Chest Port 1 View  Result Date: 11/22/2019 CLINICAL DATA:  Post LEFT lung biopsy EXAM: PORTABLE CHEST 1 VIEW COMPARISON:  Portable exam 1415 hours compared to 11/04/2019 Correlation: CT angio chest 11/04/2019, PET-CT 11/18/2019 FINDINGS: Normal heart size, mediastinal contours, and pulmonary vascularity. LEFT hilar enlargement and superior retraction of LEFT hilum. Linear subsegmental atelectasis RIGHT upper lobe. Progressive opacity in the LEFT upper lobe especially medially consistent with known suprahilar mass and slightly increased postobstructive  pneumonitis. Remaining lungs clear. No pleural effusion or pneumothorax. IMPRESSION: Known LEFT upper lobe mass, LEFT hilar adenopathy, and increased postobstructive pneumonitis in LEFT upper lobe. Subsegmental atelectasis RIGHT upper lobe. Electronically Signed   By: Lavonia Dana M.D.   On: 11/22/2019 14:34   DG C-Arm 1-60 Min-No Report  Result Date: 11/22/2019 Fluoroscopy was utilized by the requesting physician.  No radiographic interpretation.   IR IMAGING GUIDED PORT INSERTION  Result Date: 12/05/2019 INDICATION: 48 year old with lung cancer.  Port-A-Cath needed for chemotherapy. EXAM: FLUOROSCOPIC AND ULTRASOUND GUIDED PLACEMENT OF A SUBCUTANEOUS PORT COMPARISON:  None. MEDICATIONS: Ancef 2 g; The antibiotic was  administered within an appropriate time interval prior to skin puncture. ANESTHESIA/SEDATION: Versed 1.5 mg IV; Fentanyl 75 mcg IV; Moderate Sedation Time:  33 minutes The patient was continuously monitored during the procedure by the interventional radiology nurse under my direct supervision. FLUOROSCOPY TIME:  24 seconds, 1 mGy COMPLICATIONS: None immediate. PROCEDURE: The procedure, risks, benefits, and alternatives were explained to the patient. Questions regarding the procedure were encouraged and answered. The patient understands and consents to the procedure. Patient was placed supine on the interventional table. Ultrasound confirmed a patent right internal jugular vein. Ultrasound image was saved for documentation. The right chest and neck were cleaned with a skin antiseptic and a sterile drape was placed. Maximal barrier sterile technique was utilized including caps, mask, sterile gowns, sterile gloves, sterile drape, hand hygiene and skin antiseptic. The right neck was anesthetized with 1% lidocaine. Small incision was made in the right neck with a blade. Micropuncture set was placed in the right internal jugular vein with ultrasound guidance. The micropuncture wire was used for  measurement purposes. The right chest was anesthetized with 1% lidocaine with epinephrine. #15 blade was used to make an incision and a subcutaneous port pocket was formed. Nunda was assembled. Subcutaneous tunnel was formed with a stiff tunneling device. The port catheter was brought through the subcutaneous tunnel. The port was placed in the subcutaneous pocket. The micropuncture set was exchanged for a peel-away sheath. The catheter was placed through the peel-away sheath and the tip was positioned at the SVC and right atrium junction. Catheter placement was confirmed with fluoroscopy. The port was accessed and flushed with heparinized saline. The port pocket was closed using two layers of absorbable sutures and Dermabond. The vein skin site was closed using a single layer of absorbable suture and Dermabond. Sterile dressings were applied. Patient tolerated the procedure well without an immediate complication. Ultrasound and fluoroscopic images were taken and saved for this procedure. IMPRESSION: Placement of a subcutaneous port device. Catheter tip at the SVC and right atrium junction. Electronically Signed   By: Markus Daft M.D.   On: 12/05/2019 09:24     Assessment and plan- Patient is a 48 y.o. male with stage IVa non-small cell lung cancer cT2 cN2 cM1a. He is here for on treatment assessment prior to cycle 1 of weekly carbo/taxol chemotherapy.   Given that patient was getting more symptomatic from his lung mass and bulky adenopathy- he is receiving radiation upfront. I will therefore be giving him weekly carbo/taxol chemotherapy cycle 1 today. I will see him back in 1 week for cycle 2 and following that I will see him every other week. Again discussed the risks and benefits of chemotherapy include all but not limited to nausea, vomiting, low blood counts, risk of infections and hospitalizations. Treatment will be given with a palliative intent  MRI brain images were reviewed by me  independently. No intracranial metastases. 1.1 cm enhancing calvarial lesion- calvarial mets versus meningioma. Discussed with Rad onc as well. Plan is watch this for now with repeat scans in 3 months  He will undergo port placement later this wek. Repeat ct guided lung biopsy for ngs testing next week   Visit Diagnosis 1. Encounter for antineoplastic chemotherapy   2. Malignant neoplasm of upper lobe of left lung ([place])      Dr. Randa Evens, MD, MPH [facility] [phone] 12/05/2019 11:27 AM

## 2019-12-05 NOTE — Progress Notes (Signed)
  Oncology Nurse Navigator Documentation  Navigator Location: [facility] (12/04/19 1330)   )Navigator Encounter Type: Lobby (12/04/19 1330)                       Treatment Phase: CT SIM (12/04/19 1330) Barriers/Navigation Needs: No Barriers At This Time (12/04/19 1330)   Interventions: None Required (12/04/19 1330)           met with patient in lobby prior to [place]. Pt stated that chemo treatment went well and that he is feeling well today. Reviewed upcoming appts. All questions answered. Reassurance provided. Pt does not have any needs at this time. Will follow up at next clinic visit. Instructed pt to call with any further questions or needs. Pt verbalized understanding.           Time Spent with Patient: 30 (12/04/19 1330)

## 2019-12-05 NOTE — Discharge Instructions (Signed)
Lung Biopsy, Care After This sheet gives you information about how to care for yourself after your procedure. Your health care provider may also give you more specific instructions depending on the type of biopsy you had. If you have problems or questions, contact your health care provider. What can I expect after the procedure? After the procedure, it is common to have:  A cough.  A sore throat.  Pain where a needle, bronchoscope, or incision was used to collect a biopsy sample (biopsy site). Follow these instructions at home: Medicines  Take over-the-counter and prescription medicines only as told by your health care provider.  Do not drink alcohol if your health care provider tells you not to drink.  Ask your health care provider if the medicine prescribed to you: ? Requires you to avoid driving or using heavy machinery. ? Can cause constipation. You may need to take these actions to prevent or treat constipation:  Drink enough fluid to keep your urine pale yellow.  Take over-the-counter or prescription medicines.  Eat foods that are high in fiber, such as beans, whole grains, and fresh fruits and vegetables.  Limit foods that are high in fat and processed sugars, such as fried or sweet foods.  Do not drive for 24 hours if you were given a sedative. Biopsy site care   Follow instructions from your health care provider about how to take care of your biopsy site. Make sure you: ? Wash your hands with soap and water before and after you change your bandage (dressing). If soap and water are not available, use hand sanitizer. ? Remove your dressing as told by your health care provider. In 24 hours  Do not take baths, swim, or use a hot tub until your health care provider approves. Ask your health care provider if you may take showers. You may only be allowed to take sponge baths.  Check your biopsy site every day for signs of infection. Check for: ? Redness, swelling, or more  pain. ? Fluid or blood. ? Warmth. ? Pus or a bad smell. General instructions  Return to your normal activities as told by your health care provider. Ask your health care provider what activities are safe for you.  It is up to you to get the results of your procedure. Ask your health care provider, or the department that is doing the procedure, when your results will be ready.  Keep all follow-up visits as told by your health care provider. This is important. Contact a health care provider if:  You have a fever.  You have redness, swelling, or more pain around your biopsy site.  You have fluid or blood coming from your biopsy site.  Your biopsy site feels warm to the touch.  You have pus or a bad smell coming from your biopsy site.  You have pain that does not get better with medicine. Get help right away if:  You cough up blood.  You have trouble breathing.  You have chest pain.  You lose consciousness. Summary  After the procedure, it is common to have a sore throat and a cough.  Return to your normal activities as told by your health care provider. Ask your health care provider what activities are safe for you.  Take over-the-counter and prescription medicines only as told by your health care provider.  Report any unusual symptoms to your health care provider. This information is not intended to replace advice given to you by your health care provider.  Make sure you discuss any questions you have with your health care provider. Document Released: 01/10/2017 Document Revised: 01/16/2019 Document Reviewed: 01/10/2017 Elsevier Patient Education  [place] Insertion, Care After This sheet gives you information about how to care for yourself after your procedure. Your health care provider may also give you more specific instructions. If you have problems or questions, contact your health care provider. What can I expect after the  procedure? After the procedure, it is common to have:  Discomfort at the port insertion site.  Bruising on the skin over the port. This should improve over 3-4 days. Follow these instructions at home: [facility] care  After your port is placed, you will get a manufacturer's information card. The card has information about your port. Keep this card with you at all times.  Take care of the port as told by your health care provider. Ask your health care provider if you or a family member can get training for taking care of the port at home. A home health care nurse may also take care of the port.  Make sure to remember what type of port you have. Incision care      Follow instructions from your health care provider about how to take care of your port insertion site. Make sure you: ? Wash your hands with soap and water before and after you change your bandage (dressing). If soap and water are not available, use hand sanitizer. ? Remove your dressing as told by your health care provider. In 24-48 hours ? Leave stitches (sutures), skin glue, or adhesive strips in place. These skin closures may need to stay in place for 2 weeks or longer. If adhesive strip edges start to loosen and curl up, you may trim the loose edges. Do not remove adhesive strips completely unless your health care provider tells you to do that.  Check your port insertion site every day for signs of infection. Check for: ? Redness, swelling, or pain. ? Fluid or blood. ? Warmth. ? Pus or a bad smell. Activity  Return to your normal activities as told by your health care provider. Ask your health care provider what activities are safe for you.  Do not lift anything that is heavier than 10 lb (4.5 kg), or the limit that you are told, until your health care provider says that it is safe. General instructions  Take over-the-counter and prescription medicines only as told by your health care provider.  Do not take baths, swim,  or use a hot tub until your health care provider approves. Ask your health care provider if you may take showers. You may only be allowed to take sponge baths.  Do not drive for 24 hours if you were given a sedative during your procedure.  Wear a medical alert bracelet in case of an emergency. This will tell any health care providers that you have a port.  Keep all follow-up visits as told by your health care provider. This is important. Contact a health care provider if:  You cannot flush your port with saline as directed, or you cannot draw blood from the port.  You have a fever or chills.  You have redness, swelling, or pain around your port insertion site.  You have fluid or blood coming from your port insertion site.  Your port insertion site feels warm to the touch.  You have pus or a bad smell coming from the port insertion site. Get help right  away if:  You have chest pain or shortness of breath.  You have bleeding from your port that you cannot control. Summary  Take care of the port as told by your health care provider. Keep the manufacturer's information card with you at all times.  Change your dressing as told by your health care provider.  Contact a health care provider if you have a fever or chills or if you have redness, swelling, or pain around your port insertion site.  Keep all follow-up visits as told by your health care provider. This information is not intended to replace advice given to you by your health care provider. Make sure you discuss any questions you have with your health care provider. Document Released: 10/02/2013 Document Revised: 07/10/2018 Document Reviewed: 07/10/2018 Elsevier Patient Education  [place].  Moderate Conscious Sedation, Adult, Care After These instructions provide you with information about caring for yourself after your procedure. Your health care provider may also give you more specific instructions. Your treatment  has been planned according to current medical practices, but problems sometimes occur. Call your health care provider if you have any problems or questions after your procedure. What can I expect after the procedure? After your procedure, it is common:  To feel sleepy for several hours.  To feel clumsy and have poor balance for several hours.  To have poor judgment for several hours.  To vomit if you eat too soon. Follow these instructions at home: For at least 24 hours after the procedure:   Do not: ? Participate in activities where you could fall or become injured. ? Drive. ? Use heavy machinery. ? Drink alcohol. ? Take sleeping pills or medicines that cause drowsiness. ? Make important decisions or sign legal documents. ? Take care of children on your own.  Rest. Eating and drinking  Follow the diet recommended by your health care provider.  If you vomit: ? Drink water, juice, or soup when you can drink without vomiting. ? Make sure you have little or no nausea before eating solid foods. General instructions  Have a responsible adult stay with you until you are awake and alert.  Take over-the-counter and prescription medicines only as told by your health care provider.  If you smoke, do not smoke without supervision.  Keep all follow-up visits as told by your health care provider. This is important. Contact a health care provider if:  You keep feeling nauseous or you keep vomiting.  You feel light-headed.  You develop a rash.  You have a fever. Get help right away if:  You have trouble breathing. This information is not intended to replace advice given to you by your health care provider. Make sure you discuss any questions you have with your health care provider. Document Released: 10/02/2013 Document Revised: 11/24/2017 Document Reviewed: 04/02/2016 Elsevier Patient Education  2020 [employer].

## 2019-12-05 NOTE — H&P (Addendum)
Chief Complaint: Patient was seen in consultation today for lung mass  Referring Physician(s): Rao,Archana C  Supervising Physician: Markus Daft  Patient Status: [facility] - Out-pt  History of Present Illness: Kyle Guerrero is a 48 y.o. male with no significant past medical history, recently diagnosed with stage IIIb non-small cell lung cancer of the left upper lobe. Patient presented to ED 11/04/19 with left-sided chest pain and concerning CXR at his PCP's office.  A CT Chest showed a 3.8 x 3.3 cm left apical mass is noted concerning for malignancy.  A subsequent PET scan 11/18/19 showed: 1. Hypermetabolic spiculated 3.5 cm apical left upper lobe lung mass, compatible with primary bronchogenic carcinoma. 2. Adjacent hypermetabolic 3.2 x 1.2 cm pleural metastasis in the medial posterior apical left pleural space with scalloping of the adjacent posterior left third rib. 3. Hypermetabolic infiltrative upper left perihilar conglomerate nodal metastasis extending into the AP window and left hilum. 4. Hypermetabolic ipsilateral high left mediastinal nodal metastasis. No hypermetabolic contralateral mediastinal or contralateral hilar metastases. 5. Tiny 3 mm right lower lobe pulmonary nodule, below PET resolution, recommend attention on follow-up chest CT in 3 months. 6. No hypermetabolic extrathoracic or osseous metastases. 7.  Aortic Atherosclerosis (ICD10-I70.0).  He underwent bronchoscopy with lung mass biopsy 11/27 which revealed non-small cell lung cancer but further characterization could not be determined. After discussion in multidisciplinary cancer conference, patient was referred to IR for lung mass biopsy and Port-A-Cath placement.   [name] presents today in his usual state of health.  He reports exhaustion from his many doctor's appointments in the past two weeks and treatment initiation.  He asks appropriate questions about the goals of the procedure and elects to  proceed.  He has been NPO.  He does not take blood thinners.   No past medical history on file.  Past Surgical History:  Procedure Laterality Date  . CYST EXCISION    . VIDEO BRONCHOSCOPY WITH ENDOBRONCHIAL ULTRASOUND Left 11/22/2019   Procedure: VIDEO BRONCHOSCOPY WITH ENDOBRONCHIAL ULTRASOUND;  Surgeon: Tyler Pita, MD;  Location: [facility] ORS;  Service: Thoracic;  Laterality: Left;    Allergies: Patient has no known allergies.  Medications: Prior to Admission medications   Medication Sig Start Date End Date Taking? Authorizing Provider  azelastine (ASTELIN) 0.1 % nasal spray Place 1 spray into the nose 2 (two) times daily. 11/28/19   [provider]  dexamethasone (DECADRON) 4 MG tablet Take 2 tablets (8 mg total) by mouth daily. Start the day after chemotherapy for 2 days. Patient not taking: Reported on 12/02/2019 11/29/19   Sindy Guadeloupe, MD  lidocaine-prilocaine (EMLA) cream Apply to affected area once Patient not taking: Reported on 12/02/2019 11/29/19   Sindy Guadeloupe, MD  ondansetron (ZOFRAN) 8 MG tablet Take 1 tablet (8 mg total) by mouth 2 (two) times daily as needed for refractory nausea / vomiting. Start on day 3 after chemo. Patient not taking: Reported on 12/02/2019 11/29/19   Sindy Guadeloupe, MD  prochlorperazine (COMPAZINE) 10 MG tablet Take 1 tablet (10 mg total) by mouth every 6 (six) hours as needed (Nausea or vomiting). Patient not taking: Reported on 12/02/2019 11/29/19   Sindy Guadeloupe, MD     Family History  Problem Relation Age of Onset  . Healthy Mother   . Healthy Father     Social History   Socioeconomic History  . Marital status: Single    Spouse name: Not on file  . Number of children: Not on  file  . Years of education: Not on file  . Highest education level: Not on file  Occupational History  . Not on file  Tobacco Use  . Smoking status: Former Smoker    Packs/day: 2.00    Types: Cigarettes    Quit date: 11/08/2019    Years since  quitting: 0.0  . Smokeless tobacco: Never Used  Substance and Sexual Activity  . Alcohol use: Yes  . Drug use: Yes    Types: Marijuana  . Sexual activity: Not on file  Other Topics Concern  . Not on file  Social History Narrative  . Not on file   Social Determinants of Health   Financial Resource Strain:   . Difficulty of Paying Living Expenses: Not on file  Food Insecurity:   . Worried About Charity fundraiser in the Last Year: Not on file  . Ran Out of Food in the Last Year: Not on file  Transportation Needs:   . Lack of Transportation (Medical): Not on file  . Lack of Transportation (Non-Medical): Not on file  Physical Activity:   . Days of Exercise per Week: Not on file  . Minutes of Exercise per Session: Not on file  Stress:   . Feeling of Stress : Not on file  Social Connections:   . Frequency of Communication with Friends and Family: Not on file  . Frequency of Social Gatherings with Friends and Family: Not on file  . Attends Religious Services: Not on file  . Active Member of Clubs or Organizations: Not on file  . Attends Archivist Meetings: Not on file  . Marital Status: Not on file     Review of Systems: A 12 point ROS discussed and pertinent positives are indicated in the HPI above.  All other systems are negative.  Review of Systems  Constitutional: Negative for fatigue and fever.  Respiratory: Positive for cough and shortness of breath.   Cardiovascular: Negative for chest pain.  Gastrointestinal: Negative for abdominal pain.  Musculoskeletal: Negative for back pain.  Psychiatric/Behavioral: Negative for behavioral problems and confusion.    Vital Signs: There were no vitals taken for this visit.  Physical Exam Vitals and nursing note reviewed.  Constitutional:      General: He is not in acute distress.    Appearance: Normal appearance.  HENT:     Mouth/Throat:     Mouth: Mucous membranes are moist.     Pharynx: Oropharynx is clear.   Cardiovascular:     Rate and Rhythm: Normal rate and regular rhythm.     Pulses: Normal pulses.     Heart sounds: Normal heart sounds. No murmur.  Pulmonary:     Effort: Pulmonary effort is normal. No respiratory distress.     Breath sounds: Normal breath sounds.  Abdominal:     General: Abdomen is flat.     Palpations: Abdomen is soft.  Musculoskeletal:     Cervical back: Normal range of motion and neck supple. No tenderness.  Skin:    General: Skin is warm and dry.  Neurological:     General: No focal deficit present.     Mental Status: He is alert and oriented to person, place, and time. Mental status is at baseline.  Psychiatric:        Mood and Affect: Mood normal.        Behavior: Behavior normal.        Thought Content: Thought content normal.  Judgment: Judgment normal.      MD Evaluation Airway: WNL Heart: WNL Abdomen: WNL Chest/ Lungs: WNL ASA  Classification: 3 Mallampati/Airway Score: One   Imaging: MR Brain W Wo Contrast  Result Date: 12/02/2019 CLINICAL DATA:  Lung mass. EXAM: MRI HEAD WITHOUT AND WITH CONTRAST TECHNIQUE: Multiplanar, multiecho pulse sequences of the brain and surrounding structures were obtained without and with intravenous contrast. CONTRAST:  9mL GADAVIST GADOBUTROL 1 MMOL/ML IV SOLN COMPARISON:  Nuclear medicine PET scan 11/18/2019 FINDINGS: Brain: There is no evidence of acute infarct. No midline shift or extra-axial fluid collection. No chronic intracranial blood products. Small foci of cortical encephalomalacia within the anterior left frontal lobe and left frontal operculum, which may reflect chronic infarcts (series 15, image 37) (series 15, image 30). Mild scattered T2/FLAIR hyperintensity within the cerebral white matter is nonspecific, but consistent with chronic small vessel ischemic disease. Cerebral volume is normal for age. Small developmental venous anomaly within the left cerebellum. No other abnormal intracranial  enhancement is demonstrated to suggest intracranial metastatic disease. Vascular: Flow voids maintained within the proximal large arterial vessels. Skull and upper cervical spine: There is an enhancing lesion within the midline frontal calvarium, measuring 0.8 x 1.1 x 1.0 cm (series 18, image 100) (series 20, image 13). Associated scalloping of the calvarium at this site. No other focal marrow lesion identified. Sinuses/Orbits: Visualized orbits demonstrate no acute abnormality. Minimal mucosal thickening within bilateral ethmoid and right maxillary sinuses. No significant mastoid effusion. IMPRESSION: 1. 1.1 cm enhancing lesion within the midline frontal calvarium, which may reflect a calvarial metastasis or intra-diploic meningioma. Attention recommended on follow-up. 2. No evidence of intracranial metastatic disease. 3. Small foci of cortical encephalomalacia within the left frontal lobe, which may reflect chronic infarcts. 4. Mild chronic small vessel ischemic disease. Electronically Signed   By: Kellie Simmering DO   On: 12/02/2019 20:52   NM PET Image Initial (PI) Skull Base To Thigh  Result Date: 11/18/2019 CLINICAL DATA:  Initial treatment strategy for left apical lung mass. EXAM: NUCLEAR MEDICINE PET SKULL BASE TO THIGH TECHNIQUE: 9.0 mCi F-18 FDG was injected intravenously. Full-ring PET imaging was performed from the skull base to thigh after the radiotracer. CT data was obtained and used for attenuation correction and anatomic localization. Fasting blood glucose: 104 mg/dl COMPARISON:  11/04/2019 chest CT angiogram. FINDINGS: Mediastinal blood pool activity: SUV max 1.9 Liver activity: SUV max NA NECK: No hypermetabolic lymph nodes in the neck. Incidental CT findings: none CHEST: Spiculated solid 3.5 cm apical left upper lobe lung mass with max SUV 12.9 (series 3/image 79). Adjacent plaque-like hypermetabolic 3.2 x 1.2 cm mass centered in the posterior medial apical left pleural space with max SUV 13.6  (series 3/image 73), with slight scalloping of the adjacent posterior left third rib. Hypermetabolic infiltrative solid 7.3 x 3.6 cm left upper perihilar mass with max SUV 19.2 (series 3/image 93), extending into the AP window and left hilum. Hypermetabolic 0.8 cm high left mediastinal node between the left brachiocephalic vein and left subclavian artery with max SUV 12.7 (series 3/image 74). No enlarged or hypermetabolic contralateral mediastinal or contralateral hilar lymph nodes. No enlarged or hypermetabolic axillary lymph nodes. Incidental CT findings: Anterior right lower lobe 3 mm pulmonary nodule (series 3/image 90), below PET resolution. ABDOMEN/PELVIS: No abnormal hypermetabolic activity within the liver, pancreas, adrenal glands, or spleen. No hypermetabolic lymph nodes in the abdomen or pelvis. Incidental CT findings: Minimally atherosclerotic nonaneurysmal abdominal aorta. SKELETON: No focal hypermetabolic activity to suggest  skeletal metastasis. Incidental CT findings: none IMPRESSION: 1. Hypermetabolic spiculated 3.5 cm apical left upper lobe lung mass, compatible with primary bronchogenic carcinoma. 2. Adjacent hypermetabolic 3.2 x 1.2 cm pleural metastasis in the medial posterior apical left pleural space with scalloping of the adjacent posterior left third rib. 3. Hypermetabolic infiltrative upper left perihilar conglomerate nodal metastasis extending into the AP window and left hilum. 4. Hypermetabolic ipsilateral high left mediastinal nodal metastasis. No hypermetabolic contralateral mediastinal or contralateral hilar metastases. 5. Tiny 3 mm right lower lobe pulmonary nodule, below PET resolution, recommend attention on follow-up chest CT in 3 months. 6. No hypermetabolic extrathoracic or osseous metastases. 7.  Aortic Atherosclerosis (ICD10-I70.0). Electronically Signed   By: Ilona Sorrel M.D.   On: 11/18/2019 15:01   DG Chest Port 1 View  Result Date: 11/22/2019 CLINICAL DATA:  Post LEFT  lung biopsy EXAM: PORTABLE CHEST 1 VIEW COMPARISON:  Portable exam 1415 hours compared to 11/04/2019 Correlation: CT angio chest 11/04/2019, PET-CT 11/18/2019 FINDINGS: Normal heart size, mediastinal contours, and pulmonary vascularity. LEFT hilar enlargement and superior retraction of LEFT hilum. Linear subsegmental atelectasis RIGHT upper lobe. Progressive opacity in the LEFT upper lobe especially medially consistent with known suprahilar mass and slightly increased postobstructive pneumonitis. Remaining lungs clear. No pleural effusion or pneumothorax. IMPRESSION: Known LEFT upper lobe mass, LEFT hilar adenopathy, and increased postobstructive pneumonitis in LEFT upper lobe. Subsegmental atelectasis RIGHT upper lobe. Electronically Signed   By: Lavonia Dana M.D.   On: 11/22/2019 14:34   DG C-Arm 1-60 Min-No Report  Result Date: 11/22/2019 Fluoroscopy was utilized by the requesting physician.  No radiographic interpretation.    Labs:  CBC: Recent Labs    11/04/19 1035 12/02/19 1153 12/05/19 0600  WBC 5.7 7.0 5.9  HGB 12.5* 13.7 13.4  HCT 37.8* 42.4 41.4  PLT 533* 676* 657*    COAGS: Recent Labs    12/05/19 0600  INR 1.1    BMP: Recent Labs    11/04/19 1035 12/02/19 1153  NA 138 133*  K 3.8 3.9  CL 103 99  CO2 26 27  GLUCOSE 126* 98  BUN 9 12  CALCIUM 8.7* 9.0  CREATININE 0.93 0.91  GFRNONAA >60 >60  GFRAA >60 >60    LIVER FUNCTION TESTS: Recent Labs    12/02/19 1153  BILITOT 0.4  AST 16  ALT 27  ALKPHOS 160*  PROT 8.4*  ALBUMIN 3.6    TUMOR MARKERS: No results for input(s): AFPTM, CEA, CA199, CHROMGRNA in the last 8760 hours.  Assessment and Plan: Patient with no significant past medical history who presents with complaint of non-small cell lung cancer.  IR consulted for lung mass biopsy for additional testing at the request of Dr. Janese Banks.  He is also to have a Port-A-Cath placed today as he has initiated chemotherapy and is in need of durable venous  access. Case reviewed by Dr. Anselm Pancoast who approves patient for procedure.  Patient presents today in their usual state of health.  He has been NPO and is not currently on blood thinners.   Risks and benefits of CT guided lung nodule biopsy was discussed with the patient including, but not limited to bleeding, hemoptysis, respiratory failure requiring intubation, infection, pneumothorax requiring chest tube placement, stroke from air embolism or even death.  Risks and benefits of image guided port-a-catheter placement was discussed with the patient including, but not limited to bleeding, infection, pneumothorax, or fibrin sheath development and need for additional procedures.  All of the patient's questions  were answered and the patient is agreeable to proceed.  Consent signed and in chart.  Thank you for this interesting consult.  I greatly enjoyed meeting RIKKI SMESTAD and look forward to participating in their care.  A copy of this report was sent to the requesting provider on this date.  Electronically Signed: Docia Barrier, PA 12/05/2019, 8:00 AM   I spent a total of  30 Minutes   in face to face in clinical consultation, greater than 50% of which was counseling/coordinating care for non-small cell lung cancer.

## 2019-12-05 NOTE — Discharge Instructions (Signed)
Implanted Port Insertion, Care After This sheet gives you information about how to care for yourself after your procedure. Your health care provider may also give you more specific instructions. If you have problems or questions, contact your health care provider. What can I expect after the procedure? After the procedure, it is common to have:  Discomfort at the port insertion site.  Bruising on the skin over the port. This should improve over 3-4 days. Follow these instructions at home: [facility] care  After your port is placed, you will get a manufacturer's information card. The card has information about your port. Keep this card with you at all times.  Take care of the port as told by your health care provider. Ask your health care provider if you or a family member can get training for taking care of the port at home. A home health care nurse may also take care of the port.  Make sure to remember what type of port you have. Incision care      Follow instructions from your health care provider about how to take care of your port insertion site. Make sure you: ? Wash your hands with soap and water before and after you change your bandage (dressing). If soap and water are not available, use hand sanitizer. ? Change your dressing as told by your health care provider. ? Leave stitches (sutures), skin glue, or adhesive strips in place. These skin closures may need to stay in place for 2 weeks or longer. If adhesive strip edges start to loosen and curl up, you may trim the loose edges. Do not remove adhesive strips completely unless your health care provider tells you to do that.  Check your port insertion site every day for signs of infection. Check for: ? Redness, swelling, or pain. ? Fluid or blood. ? Warmth. ? Pus or a bad smell. Activity  Return to your normal activities as told by your health care provider. Ask your health care provider what activities are safe for you.  Do not  lift anything that is heavier than 10 lb (4.5 kg), or the limit that you are told, until your health care provider says that it is safe. General instructions  Take over-the-counter and prescription medicines only as told by your health care provider.  Do not take baths, swim, or use a hot tub until your health care provider approves. Ask your health care provider if you may take showers. You may only be allowed to take sponge baths.  Do not drive for 24 hours if you were given a sedative during your procedure.  Wear a medical alert bracelet in case of an emergency. This will tell any health care providers that you have a port.  Keep all follow-up visits as told by your health care provider. This is important. Contact a health care provider if:  You cannot flush your port with saline as directed, or you cannot draw blood from the port.  You have a fever or chills.  You have redness, swelling, or pain around your port insertion site.  You have fluid or blood coming from your port insertion site.  Your port insertion site feels warm to the touch.  You have pus or a bad smell coming from the port insertion site. Get help right away if:  You have chest pain or shortness of breath.  You have bleeding from your port that you cannot control. Summary  Take care of the port as told by your health  care provider. Keep the manufacturer's information card with you at all times.  Change your dressing as told by your health care provider.  Contact a health care provider if you have a fever or chills or if you have redness, swelling, or pain around your port insertion site.  Keep all follow-up visits as told by your health care provider. This information is not intended to replace advice given to you by your health care provider. Make sure you discuss any questions you have with your health care provider. Document Released: 10/02/2013 Document Revised: 07/10/2018 Document Reviewed:  07/10/2018 Elsevier Patient Education  [place]. Needle Biopsy, Care After These instructions tell you how to care for yourself after your procedure. Your doctor may also give you more specific instructions. Call your doctor if you have any problems or questions. What can I expect after the procedure? After the procedure, it is common to have:  Soreness.  Bruising.  Mild pain. Follow these instructions at home:   Return to your normal activities as told by your doctor. Ask your doctor what activities are safe for you.  Take over-the-counter and prescription medicines only as told by your doctor.  Wash your hands with soap and water before you change your bandage (dressing). If you cannot use soap and water, use hand sanitizer.  Follow instructions from your doctor about: ? How to take care of your puncture site. ? When and how to change your bandage. ? When to remove your bandage.  Check your puncture site every day for signs of infection. Watch for: ? Redness, swelling, or pain. ? Fluid or blood. ? Pus or a bad smell. ? Warmth.  Do not take baths, swim, or use a hot tub until your doctor approves. Ask your doctor if you may take showers. You may only be allowed to take sponge baths.  Keep all follow-up visits as told by your doctor. This is important. Contact a doctor if you have:  A fever.  Redness, swelling, or pain at the puncture site, and it lasts longer than a few days.  Fluid, blood, or pus coming from the puncture site.  Warmth coming from the puncture site. Get help right away if:  You have a lot of bleeding from the puncture site. Summary  After the procedure, it is common to have soreness, bruising, or mild pain at the puncture site.  Check your puncture site every day for signs of infection, such as redness, swelling, or pain.  Get help right away if you have severe bleeding from your puncture site. This information is not intended to  replace advice given to you by your health care provider. Make sure you discuss any questions you have with your health care provider. Document Released: 11/24/2008 Document Revised: 12/25/2017 Document Reviewed: 12/25/2017 Elsevier Patient Education  2020 [place]. Moderate Conscious Sedation, Adult, Care After These instructions provide you with information about caring for yourself after your procedure. Your health care provider may also give you more specific instructions. Your treatment has been planned according to current medical practices, but problems sometimes occur. Call your health care provider if you have any problems or questions after your procedure. What can I expect after the procedure? After your procedure, it is common:  To feel sleepy for several hours.  To feel clumsy and have poor balance for several hours.  To have poor judgment for several hours.  To vomit if you eat too soon. Follow these instructions at home: For at least  24 hours after the procedure:   Do not: ? Participate in activities where you could fall or become injured. ? Drive. ? Use heavy machinery. ? Drink alcohol. ? Take sleeping pills or medicines that cause drowsiness. ? Make important decisions or sign legal documents. ? Take care of children on your own.  Rest. Eating and drinking  Follow the diet recommended by your health care provider.  If you vomit: ? Drink water, juice, or soup when you can drink without vomiting. ? Make sure you have little or no nausea before eating solid foods. General instructions  Have a responsible adult stay with you until you are awake and alert.  Take over-the-counter and prescription medicines only as told by your health care provider.  If you smoke, do not smoke without supervision.  Keep all follow-up visits as told by your health care provider. This is important. Contact a health care provider if:  You keep feeling nauseous or you keep  vomiting.  You feel light-headed.  You develop a rash.  You have a fever. Get help right away if:  You have trouble breathing. This information is not intended to replace advice given to you by your health care provider. Make sure you discuss any questions you have with your health care provider. Document Released: 10/02/2013 Document Revised: 11/24/2017 Document Reviewed: 04/02/2016 Elsevier Patient Education  2020 [employer].

## 2019-12-05 NOTE — Procedures (Signed)
Interventional Radiology Procedure:   Indications: Lung cancer and needs port for chemotherapy.  Procedure: Port placement  Findings: Right jugular port, tip at SVC/RA junction  Complications: None     EBL: Minimal, less than 10 ml  Plan: Going to CT for lung biopsy.   Kyle Guerrero R. Anselm Pancoast, MD  Pager: [phone]

## 2019-12-06 DIAGNOSIS — C3411 Malignant neoplasm of upper lobe, right bronchus or lung: Secondary | ICD-10-CM | POA: Diagnosis not present

## 2019-12-06 LAB — SURGICAL PATHOLOGY

## 2019-12-09 ENCOUNTER — Inpatient Hospital Stay (HOSPITAL_BASED_OUTPATIENT_CLINIC_OR_DEPARTMENT_OTHER): Payer: 59 | Admitting: Hospice and Palliative Medicine

## 2019-12-09 ENCOUNTER — Ambulatory Visit: Payer: 59 | Admitting: Oncology

## 2019-12-09 ENCOUNTER — Other Ambulatory Visit: Payer: 59

## 2019-12-09 ENCOUNTER — Ambulatory Visit: Payer: 59

## 2019-12-09 DIAGNOSIS — C3412 Malignant neoplasm of upper lobe, left bronchus or lung: Secondary | ICD-10-CM | POA: Diagnosis not present

## 2019-12-09 NOTE — Progress Notes (Signed)
[place]  [phone] [phone]  Patient Care Team: Patient, No Pcp Per as PCP - General ([place]) Telford Nab, RN as Registered Nurse   Name of the patient: Kyle Guerrero  [id number]  08-Feb-1971   Date of Visit: 12/09/19  Diagnosis: Non-small cell lung cancer stage IV acT2 cN2 cM1 a with pleural involvement  Current Treatment: carbotaxol   Reason for Visit: This patient is a 48 y.o. male who presents to chemo care clinic today for initial meeting in preparation for starting chemotherapy. I introduced the chemo care clinic and we discussed that the role of the clinic is to assist those who are at an increased risk of emergency room visits and/or complications during the course of chemotherapy treatment. We discussed that the increased risk takes into account factors such as age, performance status, and co-morbidities. We also discussed that for some, this might include barriers to care such as not having a primary care provider, lack of insurance/transportation, or not being able to afford medications. We discussed that the goal of the program is to help prevent unplanned ER visits and help reduce complications during chemotherapy. We do this by discussing specific risk factors to each individual and identifying ways that we can help improve these risk factors and reduce barriers to care.   Hematology/Oncology History:  Oncology History  Malignant neoplasm of upper lobe of left lung ([place])  11/29/2019 Initial Diagnosis   Malignant neoplasm of upper lobe of left lung ([place])   11/29/2019 Cancer Staging   Staging form: Lung, AJCC 8th Edition - Clinical stage from 11/29/2019: Stage IVA (cT2, cN2, cM1a) - Signed by Sindy Guadeloupe, MD on 11/29/2019   12/03/2019 -  Chemotherapy   The patient had palonosetron (ALOXI) injection 0.25 mg, 0.25 mg, Intravenous,  Once, 1 of 4 cycles Administration: 0.25 mg  (12/03/2019) CARBOplatin (PARAPLATIN) 260 mg in sodium chloride 0.9 % 250 mL chemo infusion, 260 mg (100 % of original dose 262 mg), Intravenous,  Once, 1 of 4 cycles Dose modification:   (original dose 262 mg, Cycle 1) Administration: 260 mg (12/03/2019) PACLitaxel (TAXOL) 84 mg in sodium chloride 0.9 % 250 mL chemo infusion (</= 66m/m2), 45 mg/m2 = 84 mg, Intravenous,  Once, 1 of 4 cycles Administration: 84 mg (12/03/2019)  for chemotherapy treatment.       No Known Allergies   No past medical history on file.   Past Surgical History:  Procedure Laterality Date  . CYST EXCISION    . IR IMAGING GUIDED PORT INSERTION  12/05/2019  . VIDEO BRONCHOSCOPY WITH ENDOBRONCHIAL ULTRASOUND Left 11/22/2019   Procedure: VIDEO BRONCHOSCOPY WITH ENDOBRONCHIAL ULTRASOUND;  Surgeon: GTyler Pita MD;  Location: [facility] ORS;  Service: Thoracic;  Laterality: Left;    Social History   Socioeconomic History  . Marital status: Single    Spouse name: Not on file  . Number of children: Not on file  . Years of education: Not on file  . Highest education level: Not on file  Occupational History  . Not on file  Tobacco Use  . Smoking status: Former Smoker    Packs/day: 2.00    Types: Cigarettes    Quit date: 11/08/2019    Years since quitting: 0.0  . Smokeless tobacco: Never Used  Substance and Sexual Activity  . Alcohol use: Yes  . Drug use: Yes    Types: Marijuana  . Sexual activity: Not on file  Other Topics Concern  .  Not on file  Social History Narrative  . Not on file   Social Determinants of Health   Financial Resource Strain:   . Difficulty of Paying Living Expenses: Not on file  Food Insecurity:   . Worried About Charity fundraiser in the Last Year: Not on file  . Ran Out of Food in the Last Year: Not on file  Transportation Needs:   . Lack of Transportation (Medical): Not on file  . Lack of Transportation (Non-Medical): Not on file  Physical Activity:   . Days of  Exercise per Week: Not on file  . Minutes of Exercise per Session: Not on file  Stress:   . Feeling of Stress : Not on file  Social Connections:   . Frequency of Communication with Friends and Family: Not on file  . Frequency of Social Gatherings with Friends and Family: Not on file  . Attends Religious Services: Not on file  . Active Member of Clubs or Organizations: Not on file  . Attends Archivist Meetings: Not on file  . Marital Status: Not on file  Intimate Partner Violence:   . Fear of Current or Ex-Partner: Not on file  . Emotionally Abused: Not on file  . Physically Abused: Not on file  . Sexually Abused: Not on file    Family History  Problem Relation Age of Onset  . Healthy Mother   . Healthy Father     Current Outpatient Medications  Medication Sig Dispense Refill  . azelastine (ASTELIN) 0.1 % nasal spray Place 1 spray into the nose 2 (two) times daily.    Marland Kitchen dexamethasone (DECADRON) 4 MG tablet Take 2 tablets (8 mg total) by mouth daily. Start the day after chemotherapy for 2 days. (Patient not taking: Reported on 12/02/2019) 30 tablet 1  . lidocaine-prilocaine (EMLA) cream Apply to affected area once (Patient not taking: Reported on 12/02/2019) 30 g 3  . ondansetron (ZOFRAN) 8 MG tablet Take 1 tablet (8 mg total) by mouth 2 (two) times daily as needed for refractory nausea / vomiting. Start on day 3 after chemo. (Patient not taking: Reported on 12/02/2019) 30 tablet 1  . prochlorperazine (COMPAZINE) 10 MG tablet Take 1 tablet (10 mg total) by mouth every 6 (six) hours as needed (Nausea or vomiting). (Patient not taking: Reported on 12/02/2019) 30 tablet 1   No current facility-administered medications for this visit.     PERFORMANCE STATUS (ECOG) : 1 - Symptomatic but completely ambulatory  Review of Systems As noted above. Otherwise, a complete review of systems is negative.  Physical Exam Deferred due to nature of virtual visit  Assessment and Plan:     1. Cancer: Non-small cell lung cancer stage IV acT2 cN2 cM1 a with pleural involvement on carbotaxol and followed by Dr. Janese Banks.   2. High Risk for ER/Hospitalization during Chemotherapy: We discussed the role of the chemo care clinic and identified patient specific risk factors. We also discussed the role of the Symptom Management and Palliative Care Clinics at [facility] and methods of contacting clinic/provider. He denies needing specific assistance at this time.  Current PCP No Pcp Per Patient  Hospital Admissions 1   ED Visits 2   Has Medicaid No  Has Medicare No  In relationship No  Has Anemia No  Has asthma No  Has atrial fibrillation No  Has CVD No  Has chronic kidney disease No  Has Chronic Obstructive Pulmonary Disease No  Has Congestive Heart Failure No  Has Connective Tissue Disorder No  Has Depression No  Has Diabetes No  Has liver disease No  Has Peripheral Vascular Disease No     3. Social Determinants of Health:   Housing -patient lives at home alone.  He denies housing IT consultant -denies food insecurity  Transportation -has a car and is able to transport himself to appointments.  We discussed a Lucianne Lei service if needed.  Utilities -no concerns identified  Safety -patient feels safe at home  Financial Strain -patient does endorse some financial concerns.  He has already met with Elease Etienne, SW  Employment -he was formally employed in [employer].  He is currently on short-term disability  Family/Community Support -he is not married.  He has no children.  He has no siblings.  His mother is involved in his care  Physical Activity -patient reports being functionally independent   Patient would benefit from establishing a PCP.  Please call the [facility] at [phone] to establish care with a Primary Care Provider.   In the meantime, you can access care through the following free and reduced cost health care locations in  [place] ([address] [place], [place] [address]- [phone])  -[place] ([address] [place], [place] [address]- [phone])  -[facility] ([address], [place],  [address]- [phone])  -[place] ([address] [place] [phone])  -[facility] and [phone])  We discussed symptom management and palliative care clinics.  Patient expressed understanding and was in agreement with this plan. He also understands that He can call clinic at any time with any questions, concerns, or complaints.   A total of (5) minutes of non-face-to-face time was spent with this patient with greater than 50% of that time in counseling and care-coordination.   Signed by: Altha Harm, PhD, NP-C

## 2019-12-10 ENCOUNTER — Ambulatory Visit
Admission: RE | Admit: 2019-12-10 | Discharge: 2019-12-10 | Disposition: A | Discharge status: Home / Self Care | Payer: 59 | Source: Ambulatory Visit | Attending: Radiation Oncology | Admitting: Radiation Oncology

## 2019-12-10 ENCOUNTER — Other Ambulatory Visit: Payer: Self-pay

## 2019-12-11 ENCOUNTER — Ambulatory Visit
Admission: RE | Admit: 2019-12-11 | Discharge: 2019-12-11 | Disposition: A | Discharge status: Home / Self Care | Payer: 59 | Source: Ambulatory Visit | Attending: Radiation Oncology | Admitting: Radiation Oncology

## 2019-12-11 ENCOUNTER — Other Ambulatory Visit: Payer: Self-pay

## 2019-12-11 DIAGNOSIS — C3411 Malignant neoplasm of upper lobe, right bronchus or lung: Secondary | ICD-10-CM | POA: Diagnosis not present

## 2019-12-12 ENCOUNTER — Encounter: Payer: Self-pay | Admitting: *Deleted

## 2019-12-12 ENCOUNTER — Inpatient Hospital Stay (HOSPITAL_BASED_OUTPATIENT_CLINIC_OR_DEPARTMENT_OTHER): Payer: 59 | Admitting: Oncology

## 2019-12-12 ENCOUNTER — Inpatient Hospital Stay: Payer: 59

## 2019-12-12 ENCOUNTER — Encounter: Payer: Self-pay | Admitting: Oncology

## 2019-12-12 ENCOUNTER — Other Ambulatory Visit: Payer: Self-pay

## 2019-12-12 ENCOUNTER — Ambulatory Visit
Admission: RE | Admit: 2019-12-12 | Discharge: 2019-12-12 | Disposition: A | Discharge status: Home / Self Care | Payer: 59 | Source: Ambulatory Visit | Attending: Radiation Oncology | Admitting: Radiation Oncology

## 2019-12-12 VITALS — BP 134/80 | HR 92 | Resp 18

## 2019-12-12 VITALS — BP 136/81 | HR 109 | Temp 98.6°F | Resp 16 | Ht 71.0 in | Wt 160.0 lb

## 2019-12-12 DIAGNOSIS — Z5111 Encounter for antineoplastic chemotherapy: Secondary | ICD-10-CM

## 2019-12-12 DIAGNOSIS — C3412 Malignant neoplasm of upper lobe, left bronchus or lung: Secondary | ICD-10-CM

## 2019-12-12 DIAGNOSIS — C3411 Malignant neoplasm of upper lobe, right bronchus or lung: Secondary | ICD-10-CM | POA: Diagnosis not present

## 2019-12-12 DIAGNOSIS — Z95828 Presence of other vascular implants and grafts: Secondary | ICD-10-CM

## 2019-12-12 LAB — COMPREHENSIVE METABOLIC PANEL
ALT: 42 U/L (ref 0–44)
AST: 29 U/L (ref 15–41)
Albumin: 3.2 g/dL — ABNORMAL LOW (ref 3.5–5.0)
Alkaline Phosphatase: 167 U/L — ABNORMAL HIGH (ref 38–126)
Anion gap: 10 (ref 5–15)
BUN: 14 mg/dL (ref 6–20)
CO2: 26 mmol/L (ref 22–32)
Calcium: 8.8 mg/dL — ABNORMAL LOW (ref 8.9–10.3)
Chloride: 99 mmol/L (ref 98–111)
Creatinine, Ser: 0.79 mg/dL (ref 0.61–1.24)
GFR calc Af Amer: >60 mL/min (ref >60)
GFR calc non Af Amer: >60 mL/min (ref >60)
Glucose, Bld: 109 mg/dL — ABNORMAL HIGH (ref 70–99)
Potassium: 3.6 mmol/L (ref 3.5–5.1)
Sodium: 135 mmol/L (ref 135–145)
Total Bilirubin: 0.4 mg/dL (ref 0.3–1.2)
Total Protein: 7.4 g/dL (ref 6.5–8.1)

## 2019-12-12 LAB — CBC WITH DIFFERENTIAL/PLATELET
Abs Immature Granulocytes: 0.03 10*3/uL (ref 0.00–0.07)
Basophils Absolute: 0 10*3/uL (ref 0.0–0.1)
Basophils Relative: 1 %
Eosinophils Absolute: 0.1 10*3/uL (ref 0.0–0.5)
Eosinophils Relative: 2 %
HCT: 38.1 % — ABNORMAL LOW (ref 39.0–52.0)
Hemoglobin: 12 g/dL — ABNORMAL LOW (ref 13.0–17.0)
Immature Granulocytes: 1 %
Lymphocytes Relative: 20 %
Lymphs Abs: 1.3 10*3/uL (ref 0.7–4.0)
MCH: 27.7 pg (ref 26.0–34.0)
MCHC: 31.5 g/dL (ref 30.0–36.0)
MCV: 88 fL (ref 80.0–100.0)
Monocytes Absolute: 0.8 10*3/uL (ref 0.1–1.0)
Monocytes Relative: 13 %
Neutro Abs: 4.1 10*3/uL (ref 1.7–7.7)
Neutrophils Relative %: 63 %
Platelets: 617 10*3/uL — ABNORMAL HIGH (ref 150–400)
RBC: 4.33 MIL/uL (ref 4.22–5.81)
RDW: 14 % (ref 11.5–15.5)
WBC: 6.3 10*3/uL (ref 4.0–10.5)
nRBC: 0 % (ref 0.0–0.2)

## 2019-12-12 MED ORDER — HEPARIN SOD (PORK) LOCK FLUSH 100 UNIT/ML IV SOLN
500.0000 [IU] | Freq: Once | INTRAVENOUS | Status: AC | PRN
  Administered 2019-12-12: 15:00:00 500 [IU]
  Filled 2019-12-12: qty 5

## 2019-12-12 MED ORDER — SODIUM CHLORIDE 0.9% FLUSH
10.0000 mL | Freq: Once | INTRAVENOUS | Status: AC
  Administered 2019-12-12: 11:00:00 10 mL via INTRAVENOUS
  Filled 2019-12-12: qty 10

## 2019-12-12 MED ORDER — DIPHENHYDRAMINE HCL 50 MG/ML IJ SOLN
50.0000 mg | Freq: Once | INTRAMUSCULAR | Status: AC
  Administered 2019-12-12: 12:00:00 50 mg via INTRAVENOUS
  Filled 2019-12-12: qty 1

## 2019-12-12 MED ORDER — PALONOSETRON HCL INJECTION 0.25 MG/5ML
0.2500 mg | Freq: Once | INTRAVENOUS | Status: AC
  Administered 2019-12-12: 12:00:00 0.25 mg via INTRAVENOUS
  Filled 2019-12-12: qty 5

## 2019-12-12 MED ORDER — SODIUM CHLORIDE 0.9 % IV SOLN
260.0000 mg | Freq: Once | INTRAVENOUS | Status: AC
  Administered 2019-12-12: 15:00:00 260 mg via INTRAVENOUS
  Filled 2019-12-12: qty 26

## 2019-12-12 MED ORDER — FAMOTIDINE IN NACL 20-0.9 MG/50ML-% IV SOLN
20.0000 mg | Freq: Once | INTRAVENOUS | Status: AC
  Administered 2019-12-12: 20 mg via INTRAVENOUS

## 2019-12-12 MED ORDER — SODIUM CHLORIDE 0.9 % IV SOLN
Freq: Once | INTRAVENOUS | Status: AC
  Filled 2019-12-12: qty 250

## 2019-12-12 MED ORDER — SODIUM CHLORIDE 0.9 % IV SOLN
45.0000 mg/m2 | Freq: Once | INTRAVENOUS | Status: AC
  Administered 2019-12-12: 84 mg via INTRAVENOUS
  Filled 2019-12-12: qty 14

## 2019-12-12 MED ORDER — SODIUM CHLORIDE 0.9 % IV SOLN
10.0000 mg | Freq: Once | INTRAVENOUS | Status: AC
  Administered 2019-12-12: 13:00:00 10 mg via INTRAVENOUS
  Filled 2019-12-12: qty 10

## 2019-12-12 NOTE — Progress Notes (Signed)
Pt trying to eat 6-7 times a day and started ensure yest. He has some occ. Pains in left chest where mass is but no pain today.

## 2019-12-12 NOTE — Progress Notes (Signed)
Hematology/Oncology Consult note [facility]  Telephone:([phone] [phone]  Patient Care Team: Patient, No Pcp Per as PCP - General ([place]) Telford Nab, RN as Registered Nurse   Name of the patient: Kyle Guerrero  [id number]  [birth year]   Date of visit: 12/12/19  Diagnosis-  Non-small cell lung cancer stage IV acT2 cN2 cM1 a with pleural involvement  Chief complaint/ Reason for visit-on treatment assessment prior to cycle 2 of weekly carbotaxol chemotherapy  Heme/Onc history: patient is a 48 year old male who presented to the ER with symptoms ofheaviness in his chest and upper left chest discomfort he underwent CT angio chest to rule out PE which showed 3.8 x 3.3 cm left upper palpable lung mass along with 4.4 x 3.3 cm lobulated mass in the aortopulmonary window and 2.6 x 2.4 cm left hilar mass all concerning for malignancy. Patient has also seen pulmonary and has been set up for bronchoscopy and EBUS guided biopsy on 11/23/2019. Patient underwent. PET CT scan which showed a hypermetabolic spiculated 3.5 cm of 5 left upper lobe lung mass, adjacent hypermetabolic 3.2 x 1.2 cm pleural metastases in the medial posterior by the left pleural space along with scalloping of the adjacent posterior left third rib. Hypermetabolic infiltrative left perihilar conglomerate nodal metastases measuring up to 7.3 x 3.6 cm and 0.8 cm high left mediastinal node between the left brachiocephalic vein and left subclavian artery. No evidence of distant metastatic disease  Biopsy showed non-small cell lung cancer but further characterization could not be determined. Insufficient tissue for NGS testing   Interval history-feels fatigued but denies other complaints  ECOG PS- 1 Pain scale- 0 Opioid associated constipation- no  Review of systems- Review of Systems  Constitutional: Positive for malaise/fatigue. Negative for chills, fever and weight  loss.  HENT: Negative for congestion, ear discharge and nosebleeds.   Eyes: Negative for blurred vision.  Respiratory: Negative for cough, hemoptysis, sputum production, shortness of breath and wheezing.   Cardiovascular: Negative for chest pain, palpitations, orthopnea and claudication.  Gastrointestinal: Negative for abdominal pain, blood in stool, constipation, diarrhea, heartburn, melena, nausea and vomiting.  Genitourinary: Negative for dysuria, flank pain, frequency, hematuria and urgency.  Musculoskeletal: Negative for back pain, joint pain and myalgias.  Skin: Negative for rash.  Neurological: Negative for dizziness, tingling, focal weakness, seizures, weakness and headaches.  Endo/Heme/Allergies: Does not bruise/bleed easily.  Psychiatric/Behavioral: Negative for depression and suicidal ideas. The patient does not have insomnia.        No Known Allergies   History reviewed. No pertinent past medical history.   Past Surgical History:  Procedure Laterality Date  . CYST EXCISION    . IR IMAGING GUIDED PORT INSERTION  12/05/2019  . VIDEO BRONCHOSCOPY WITH ENDOBRONCHIAL ULTRASOUND Left 11/22/2019   Procedure: VIDEO BRONCHOSCOPY WITH ENDOBRONCHIAL ULTRASOUND;  Surgeon: Tyler Pita, MD;  Location: [facility] ORS;  Service: Thoracic;  Laterality: Left;    Social History   Socioeconomic History  . Marital status: Single    Spouse name: Not on file  . Number of children: Not on file  . Years of education: Not on file  . Highest education level: Not on file  Occupational History  . Not on file  Tobacco Use  . Smoking status: Former Smoker    Packs/day: 2.00    Types: Cigarettes    Quit date: 11/08/2019    Years since quitting: 0.0  . Smokeless tobacco: Never Used  Substance and Sexual Activity  .  Alcohol use: Not Currently  . Drug use: Yes    Types: Marijuana  . Sexual activity: Not on file  Other Topics Concern  . Not on file  Social History Narrative  . Not on  file   Social Determinants of Health   Financial Resource Strain:   . Difficulty of Paying Living Expenses: Not on file  Food Insecurity:   . Worried About Charity fundraiser in the Last Year: Not on file  . Ran Out of Food in the Last Year: Not on file  Transportation Needs:   . Lack of Transportation (Medical): Not on file  . Lack of Transportation (Non-Medical): Not on file  Physical Activity:   . Days of Exercise per Week: Not on file  . Minutes of Exercise per Session: Not on file  Stress:   . Feeling of Stress : Not on file  Social Connections:   . Frequency of Communication with Friends and Family: Not on file  . Frequency of Social Gatherings with Friends and Family: Not on file  . Attends Religious Services: Not on file  . Active Member of Clubs or Organizations: Not on file  . Attends Archivist Meetings: Not on file  . Marital Status: Not on file  Intimate Partner Violence:   . Fear of Current or Ex-Partner: Not on file  . Emotionally Abused: Not on file  . Physically Abused: Not on file  . Sexually Abused: Not on file    Family History  Problem Relation Age of Onset  . Healthy Mother   . Healthy Father      Current Outpatient Medications:  .  azelastine (ASTELIN) 0.1 % nasal spray, Place 1 spray into the nose 2 (two) times daily., Disp: , Rfl:  .  dexamethasone (DECADRON) 4 MG tablet, Take 2 tablets (8 mg total) by mouth daily. Start the day after chemotherapy for 2 days., Disp: 30 tablet, Rfl: 1 .  lidocaine-prilocaine (EMLA) cream, Apply to affected area once, Disp: 30 g, Rfl: 3 .  ondansetron (ZOFRAN) 8 MG tablet, Take 1 tablet (8 mg total) by mouth 2 (two) times daily as needed for refractory nausea / vomiting. Start on day 3 after chemo., Disp: 30 tablet, Rfl: 1 .  prochlorperazine (COMPAZINE) 10 MG tablet, Take 1 tablet (10 mg total) by mouth every 6 (six) hours as needed (Nausea or vomiting)., Disp: 30 tablet, Rfl: 1 No current  facility-administered medications for this visit.  Facility-Administered Medications Ordered in Other Visits:  .  CARBOplatin (PARAPLATIN) 260 mg in sodium chloride 0.9 % 250 mL chemo infusion, 260 mg, Intravenous, Once, Sindy Guadeloupe, MD .  heparin lock flush 100 unit/mL, 500 Units, Intracatheter, Once PRN, Sindy Guadeloupe, MD .  PACLitaxel (TAXOL) 84 mg in sodium chloride 0.9 % 250 mL chemo infusion (</= 80mg /m2), 45 mg/m2 (Treatment Plan Recorded), Intravenous, Once, Sindy Guadeloupe, MD, Stopped at 12/12/19 1416  Physical exam:  Vitals:   12/12/19 1059  BP: 136/81  Pulse: (!) 109  Resp: 16  Temp: 98.6 F (37 C)  TempSrc: Tympanic  Weight: 160 lb (72.6 kg)  Height: 5\' 11"  (1.803 m)   Physical Exam HENT:     Head: Normocephalic and atraumatic.  Eyes:     Pupils: Pupils are equal, round, and reactive to light.  Cardiovascular:     Rate and Rhythm: Regular rhythm. Tachycardia present.     Heart sounds: Normal heart sounds.  Pulmonary:     Effort: Pulmonary effort  is normal.     Breath sounds: Normal breath sounds.  Abdominal:     General: Bowel sounds are normal.     Palpations: Abdomen is soft.  Musculoskeletal:     Cervical back: Normal range of motion.  Skin:    General: Skin is warm and dry.  Neurological:     Mental Status: He is alert and oriented to person, place, and time.      CMP Latest Ref Rng & Units 12/12/2019  Glucose 70 - 99 mg/dL 109(H)  BUN 6 - 20 mg/dL 14  Creatinine 0.61 - 1.24 mg/dL 0.79  Sodium 135 - 145 mmol/L 135  Potassium 3.5 - 5.1 mmol/L 3.6  Chloride 98 - 111 mmol/L 99  CO2 22 - 32 mmol/L 26  Calcium 8.9 - 10.3 mg/dL 8.8(L)  Total Protein 6.5 - 8.1 g/dL 7.4  Total Bilirubin 0.3 - 1.2 mg/dL 0.4  Alkaline Phos 38 - 126 U/L 167(H)  AST 15 - 41 U/L 29  ALT 0 - 44 U/L 42   CBC Latest Ref Rng & Units 12/12/2019  WBC 4.0 - 10.5 K/uL 6.3  Hemoglobin 13.0 - 17.0 g/dL 12.0(L)  Hematocrit 39.0 - 52.0 % 38.1(L)  Platelets 150 - 400 K/uL 617(H)     No images are attached to the encounter.  MR Brain W Wo Contrast  Result Date: 12/02/2019 CLINICAL DATA:  Lung mass. EXAM: MRI HEAD WITHOUT AND WITH CONTRAST TECHNIQUE: Multiplanar, multiecho pulse sequences of the brain and surrounding structures were obtained without and with intravenous contrast. CONTRAST:  70mL GADAVIST GADOBUTROL 1 MMOL/ML IV SOLN COMPARISON:  Nuclear medicine PET scan 11/18/2019 FINDINGS: Brain: There is no evidence of acute infarct. No midline shift or extra-axial fluid collection. No chronic intracranial blood products. Small foci of cortical encephalomalacia within the anterior left frontal lobe and left frontal operculum, which may reflect chronic infarcts (series 15, image 37) (series 15, image 30). Mild scattered T2/FLAIR hyperintensity within the cerebral white matter is nonspecific, but consistent with chronic small vessel ischemic disease. Cerebral volume is normal for age. Small developmental venous anomaly within the left cerebellum. No other abnormal intracranial enhancement is demonstrated to suggest intracranial metastatic disease. Vascular: Flow voids maintained within the proximal large arterial vessels. Skull and upper cervical spine: There is an enhancing lesion within the midline frontal calvarium, measuring 0.8 x 1.1 x 1.0 cm (series 18, image 100) (series 20, image 13). Associated scalloping of the calvarium at this site. No other focal marrow lesion identified. Sinuses/Orbits: Visualized orbits demonstrate no acute abnormality. Minimal mucosal thickening within bilateral ethmoid and right maxillary sinuses. No significant mastoid effusion. IMPRESSION: 1. 1.1 cm enhancing lesion within the midline frontal calvarium, which may reflect a calvarial metastasis or intra-diploic meningioma. Attention recommended on follow-up. 2. No evidence of intracranial metastatic disease. 3. Small foci of cortical encephalomalacia within the left frontal lobe, which may reflect  chronic infarcts. 4. Mild chronic small vessel ischemic disease. Electronically Signed   By: Kellie Simmering DO   On: 12/02/2019 20:52   NM PET Image Initial (PI) Skull Base To Thigh  Result Date: 11/18/2019 CLINICAL DATA:  Initial treatment strategy for left apical lung mass. EXAM: NUCLEAR MEDICINE PET SKULL BASE TO THIGH TECHNIQUE: 9.0 mCi F-18 FDG was injected intravenously. Full-ring PET imaging was performed from the skull base to thigh after the radiotracer. CT data was obtained and used for attenuation correction and anatomic localization. Fasting blood glucose: 104 mg/dl COMPARISON:  11/04/2019 chest CT angiogram. FINDINGS: Mediastinal  blood pool activity: SUV max 1.9 Liver activity: SUV max NA NECK: No hypermetabolic lymph nodes in the neck. Incidental CT findings: none CHEST: Spiculated solid 3.5 cm apical left upper lobe lung mass with max SUV 12.9 (series 3/image 79). Adjacent plaque-like hypermetabolic 3.2 x 1.2 cm mass centered in the posterior medial apical left pleural space with max SUV 13.6 (series 3/image 73), with slight scalloping of the adjacent posterior left third rib. Hypermetabolic infiltrative solid 7.3 x 3.6 cm left upper perihilar mass with max SUV 19.2 (series 3/image 93), extending into the AP window and left hilum. Hypermetabolic 0.8 cm high left mediastinal node between the left brachiocephalic vein and left subclavian artery with max SUV 12.7 (series 3/image 74). No enlarged or hypermetabolic contralateral mediastinal or contralateral hilar lymph nodes. No enlarged or hypermetabolic axillary lymph nodes. Incidental CT findings: Anterior right lower lobe 3 mm pulmonary nodule (series 3/image 90), below PET resolution. ABDOMEN/PELVIS: No abnormal hypermetabolic activity within the liver, pancreas, adrenal glands, or spleen. No hypermetabolic lymph nodes in the abdomen or pelvis. Incidental CT findings: Minimally atherosclerotic nonaneurysmal abdominal aorta. SKELETON: No focal  hypermetabolic activity to suggest skeletal metastasis. Incidental CT findings: none IMPRESSION: 1. Hypermetabolic spiculated 3.5 cm apical left upper lobe lung mass, compatible with primary bronchogenic carcinoma. 2. Adjacent hypermetabolic 3.2 x 1.2 cm pleural metastasis in the medial posterior apical left pleural space with scalloping of the adjacent posterior left third rib. 3. Hypermetabolic infiltrative upper left perihilar conglomerate nodal metastasis extending into the AP window and left hilum. 4. Hypermetabolic ipsilateral high left mediastinal nodal metastasis. No hypermetabolic contralateral mediastinal or contralateral hilar metastases. 5. Tiny 3 mm right lower lobe pulmonary nodule, below PET resolution, recommend attention on follow-up chest CT in 3 months. 6. No hypermetabolic extrathoracic or osseous metastases. 7.  Aortic Atherosclerosis (ICD10-I70.0). Electronically Signed   By: Ilona Sorrel M.D.   On: 11/18/2019 15:01   CT Biopsy  Result Date: 12/05/2019 INDICATION: 48 year old with lung cancer but needs additional tissue for treatment planning. EXAM: CT-GUIDED LEFT LUNG MASS BIOPSY MEDICATIONS: None. ANESTHESIA/SEDATION: Moderate (conscious) sedation was employed during this procedure. A total of Versed 1.5 mg and Fentanyl 75 mcg was administered intravenously. Moderate Sedation Time: 18 minutes. The patient's level of consciousness and vital signs were monitored continuously by radiology nursing throughout the procedure under my direct supervision. FLUOROSCOPY TIME:  None COMPLICATIONS: None immediate. PROCEDURE: Informed written consent was obtained from the patient after a thorough discussion of the procedural risks, benefits and alternatives. All questions were addressed. A timeout was performed prior to the initiation of the procedure. Patient was placed prone. CT images through the upper chest were obtained. Mass in the posterior left upper lobe was targeted for biopsy. Left side of  the back was prepped with chlorhexidine and sterile field was created. Skin and soft tissues were anesthetized with 1% lidocaine. Using CT guidance, a 17 gauge coaxial needle was directed into the lateral aspect of the lesion. 18 gauge core biopsies were obtained. Specimens placed in formalin. Needle was removed using a BioSentry tract sealant. Bandage placed over the puncture site. FINDINGS: Spiculated mass in the left upper lobe. Needle was directed into the lateral aspect of the mass because this area was hypermetabolic on previous PET-CT. Adequate core specimens were obtained. No significant pneumothorax on the post biopsy images. IMPRESSION: Successful CT-guided core biopsies of the left upper lobe lung mass. Electronically Signed   By: Markus Daft M.D.   On: 12/05/2019 14:47   DG  Chest Port 1 View  Result Date: 12/05/2019 CLINICAL DATA:  Status post port placement EXAM: PORTABLE CHEST 1 VIEW COMPARISON:  11/22/2019 FINDINGS: There is a right-sided Port-A-Cath with the tip projecting over the SVC. There is no focal consolidation. There is no pleural effusion or pneumothorax. There is a left suprahilar mass. The heart size is normal. There is no acute osseous abnormality. IMPRESSION: 1. Right-sided Port-A-Cath in satisfactory position. 2. Left suprahilar mass most concerning for malignancy. Electronically Signed   By: Kathreen Devoid   On: 12/05/2019 12:26   DG Chest Port 1 View  Result Date: 11/22/2019 CLINICAL DATA:  Post LEFT lung biopsy EXAM: PORTABLE CHEST 1 VIEW COMPARISON:  Portable exam 1415 hours compared to 11/04/2019 Correlation: CT angio chest 11/04/2019, PET-CT 11/18/2019 FINDINGS: Normal heart size, mediastinal contours, and pulmonary vascularity. LEFT hilar enlargement and superior retraction of LEFT hilum. Linear subsegmental atelectasis RIGHT upper lobe. Progressive opacity in the LEFT upper lobe especially medially consistent with known suprahilar mass and slightly increased  postobstructive pneumonitis. Remaining lungs clear. No pleural effusion or pneumothorax. IMPRESSION: Known LEFT upper lobe mass, LEFT hilar adenopathy, and increased postobstructive pneumonitis in LEFT upper lobe. Subsegmental atelectasis RIGHT upper lobe. Electronically Signed   By: Lavonia Dana M.D.   On: 11/22/2019 14:34   DG C-Arm 1-60 Min-No Report  Result Date: 11/22/2019 Fluoroscopy was utilized by the requesting physician.  No radiographic interpretation.   IR IMAGING GUIDED PORT INSERTION  Result Date: 12/05/2019 INDICATION: 48 year old with lung cancer.  Port-A-Cath needed for chemotherapy. EXAM: FLUOROSCOPIC AND ULTRASOUND GUIDED PLACEMENT OF A SUBCUTANEOUS PORT COMPARISON:  None. MEDICATIONS: Ancef 2 g; The antibiotic was administered within an appropriate time interval prior to skin puncture. ANESTHESIA/SEDATION: Versed 1.5 mg IV; Fentanyl 75 mcg IV; Moderate Sedation Time:  33 minutes The patient was continuously monitored during the procedure by the interventional radiology nurse under my direct supervision. FLUOROSCOPY TIME:  24 seconds, 1 mGy COMPLICATIONS: None immediate. PROCEDURE: The procedure, risks, benefits, and alternatives were explained to the patient. Questions regarding the procedure were encouraged and answered. The patient understands and consents to the procedure. Patient was placed supine on the interventional table. Ultrasound confirmed a patent right internal jugular vein. Ultrasound image was saved for documentation. The right chest and neck were cleaned with a skin antiseptic and a sterile drape was placed. Maximal barrier sterile technique was utilized including caps, mask, sterile gowns, sterile gloves, sterile drape, hand hygiene and skin antiseptic. The right neck was anesthetized with 1% lidocaine. Small incision was made in the right neck with a blade. Micropuncture set was placed in the right internal jugular vein with ultrasound guidance. The micropuncture wire  was used for measurement purposes. The right chest was anesthetized with 1% lidocaine with epinephrine. #15 blade was used to make an incision and a subcutaneous port pocket was formed. Kevin was assembled. Subcutaneous tunnel was formed with a stiff tunneling device. The port catheter was brought through the subcutaneous tunnel. The port was placed in the subcutaneous pocket. The micropuncture set was exchanged for a peel-away sheath. The catheter was placed through the peel-away sheath and the tip was positioned at the SVC and right atrium junction. Catheter placement was confirmed with fluoroscopy. The port was accessed and flushed with heparinized saline. The port pocket was closed using two layers of absorbable sutures and Dermabond. The vein skin site was closed using a single layer of absorbable suture and Dermabond. Sterile dressings were applied. Patient tolerated the procedure  well without an immediate complication. Ultrasound and fluoroscopic images were taken and saved for this procedure. IMPRESSION: Placement of a subcutaneous port device. Catheter tip at the SVC and right atrium junction. Electronically Signed   By: Markus Daft M.D.   On: 12/05/2019 09:24     Assessment and plan- Patient is a 48 y.o. male stage IVa non-small cell lung cancer cT2 cN2 cM1a.  He is here for on treatment assessment prior to cycle 2 of weekly carbotaxol chemotherapy  Counts okay to proceed with cycle 2 of weekly carbotaxol chemotherapy today.  He will directly proceed for cycle 3 next week and I will see him back in 2 weeks for cycle 4.  Awaiting NGS studies to come back  Thrombocytosis: Suspect reactive to malignancy.  Continue to monitor   Visit Diagnosis 1. Malignant neoplasm of upper lobe of left lung ([place])   2. Encounter for antineoplastic chemotherapy      Dr. Randa Evens, MD, MPH [facility] [phone] 12/12/2019 1:29 PM

## 2019-12-12 NOTE — Progress Notes (Signed)
  Oncology Nurse Navigator Documentation  Navigator Location: [facility]Med Onc (12/12/19 1500)   )Navigator Encounter Type: Treatment (12/12/19 1500)                     Patient Visit Type: MedOnc (12/12/19 1500) Treatment Phase: Treatment (12/12/19 1500) Barriers/Navigation Needs: No Barriers At This Time (12/12/19 1500)   Interventions: None Required (12/12/19 1500)             met with patient during follow up appt with Dr. Janese Banks to assess for any additional needs. Pt states is doing well. Does not voice any needs at this time. Will follow up at next visit.          Time Spent with Patient: 30 (12/12/19 1500)

## 2019-12-13 ENCOUNTER — Other Ambulatory Visit: Payer: Self-pay

## 2019-12-13 ENCOUNTER — Ambulatory Visit
Admission: RE | Admit: 2019-12-13 | Discharge: 2019-12-13 | Disposition: A | Discharge status: Home / Self Care | Payer: 59 | Source: Ambulatory Visit | Attending: Radiation Oncology | Admitting: Radiation Oncology

## 2019-12-13 DIAGNOSIS — C3411 Malignant neoplasm of upper lobe, right bronchus or lung: Secondary | ICD-10-CM | POA: Diagnosis not present

## 2019-12-16 ENCOUNTER — Other Ambulatory Visit: Payer: Self-pay

## 2019-12-16 ENCOUNTER — Ambulatory Visit
Admission: RE | Admit: 2019-12-16 | Discharge: 2019-12-16 | Disposition: A | Discharge status: Home / Self Care | Payer: 59 | Source: Ambulatory Visit | Attending: Radiation Oncology | Admitting: Radiation Oncology

## 2019-12-16 DIAGNOSIS — C3411 Malignant neoplasm of upper lobe, right bronchus or lung: Secondary | ICD-10-CM | POA: Diagnosis not present

## 2019-12-17 ENCOUNTER — Ambulatory Visit
Admission: RE | Admit: 2019-12-17 | Discharge: 2019-12-17 | Disposition: A | Discharge status: Home / Self Care | Payer: 59 | Source: Ambulatory Visit | Attending: Radiation Oncology | Admitting: Radiation Oncology

## 2019-12-17 ENCOUNTER — Other Ambulatory Visit: Payer: Self-pay

## 2019-12-17 DIAGNOSIS — C3411 Malignant neoplasm of upper lobe, right bronchus or lung: Secondary | ICD-10-CM | POA: Diagnosis not present

## 2019-12-18 ENCOUNTER — Other Ambulatory Visit: Payer: Self-pay

## 2019-12-18 ENCOUNTER — Ambulatory Visit
Admission: RE | Admit: 2019-12-18 | Discharge: 2019-12-18 | Disposition: A | Discharge status: Home / Self Care | Payer: 59 | Source: Ambulatory Visit | Attending: Radiation Oncology | Admitting: Radiation Oncology

## 2019-12-18 DIAGNOSIS — C3411 Malignant neoplasm of upper lobe, right bronchus or lung: Secondary | ICD-10-CM | POA: Diagnosis not present

## 2019-12-19 ENCOUNTER — Inpatient Hospital Stay: Payer: 59

## 2019-12-19 ENCOUNTER — Encounter: Payer: Self-pay | Admitting: *Deleted

## 2019-12-19 ENCOUNTER — Other Ambulatory Visit: Payer: Self-pay

## 2019-12-19 ENCOUNTER — Ambulatory Visit
Admission: RE | Admit: 2019-12-19 | Discharge: 2019-12-19 | Disposition: A | Discharge status: Home / Self Care | Payer: 59 | Source: Ambulatory Visit | Attending: Radiation Oncology | Admitting: Radiation Oncology

## 2019-12-19 VITALS — BP 114/73 | HR 130 | Temp 97.2°F | Resp 26

## 2019-12-19 VITALS — HR 104 | Wt 160.0 lb

## 2019-12-19 DIAGNOSIS — C3412 Malignant neoplasm of upper lobe, left bronchus or lung: Secondary | ICD-10-CM

## 2019-12-19 DIAGNOSIS — C3411 Malignant neoplasm of upper lobe, right bronchus or lung: Secondary | ICD-10-CM | POA: Diagnosis not present

## 2019-12-19 DIAGNOSIS — Z95828 Presence of other vascular implants and grafts: Secondary | ICD-10-CM

## 2019-12-19 LAB — COMPREHENSIVE METABOLIC PANEL
ALT: 52 U/L — ABNORMAL HIGH (ref 0–44)
AST: 34 U/L (ref 15–41)
Albumin: 2.9 g/dL — ABNORMAL LOW (ref 3.5–5.0)
Alkaline Phosphatase: 149 U/L — ABNORMAL HIGH (ref 38–126)
Anion gap: 9 (ref 5–15)
BUN: 19 mg/dL (ref 6–20)
CO2: 25 mmol/L (ref 22–32)
Calcium: 8.5 mg/dL — ABNORMAL LOW (ref 8.9–10.3)
Chloride: 98 mmol/L (ref 98–111)
Creatinine, Ser: 0.76 mg/dL (ref 0.61–1.24)
GFR calc Af Amer: >60 mL/min (ref >60)
GFR calc non Af Amer: >60 mL/min (ref >60)
Glucose, Bld: 106 mg/dL — ABNORMAL HIGH (ref 70–99)
Potassium: 4 mmol/L (ref 3.5–5.1)
Sodium: 132 mmol/L — ABNORMAL LOW (ref 135–145)
Total Bilirubin: 0.3 mg/dL (ref 0.3–1.2)
Total Protein: 6.9 g/dL (ref 6.5–8.1)

## 2019-12-19 LAB — CBC WITH DIFFERENTIAL/PLATELET
Abs Immature Granulocytes: 0.04 10*3/uL (ref 0.00–0.07)
Basophils Absolute: 0 10*3/uL (ref 0.0–0.1)
Basophils Relative: 0 %
Eosinophils Absolute: 0.1 10*3/uL (ref 0.0–0.5)
Eosinophils Relative: 1 %
HCT: 35.3 % — ABNORMAL LOW (ref 39.0–52.0)
Hemoglobin: 11.2 g/dL — ABNORMAL LOW (ref 13.0–17.0)
Immature Granulocytes: 1 %
Lymphocytes Relative: 14 %
Lymphs Abs: 0.8 10*3/uL (ref 0.7–4.0)
MCH: 27.7 pg (ref 26.0–34.0)
MCHC: 31.7 g/dL (ref 30.0–36.0)
MCV: 87.4 fL (ref 80.0–100.0)
Monocytes Absolute: 0.8 10*3/uL (ref 0.1–1.0)
Monocytes Relative: 15 %
Neutro Abs: 3.7 10*3/uL (ref 1.7–7.7)
Neutrophils Relative %: 69 %
Platelets: 527 10*3/uL — ABNORMAL HIGH (ref 150–400)
RBC: 4.04 MIL/uL — ABNORMAL LOW (ref 4.22–5.81)
RDW: 13.7 % (ref 11.5–15.5)
WBC: 5.4 10*3/uL (ref 4.0–10.5)
nRBC: 0 % (ref 0.0–0.2)

## 2019-12-19 MED ORDER — HEPARIN SOD (PORK) LOCK FLUSH 100 UNIT/ML IV SOLN
500.0000 [IU] | Freq: Once | INTRAVENOUS | Status: AC | PRN
  Administered 2019-12-19: 11:00:00 500 [IU]
  Filled 2019-12-19: qty 5

## 2019-12-19 MED ORDER — SODIUM CHLORIDE 0.9 % IV SOLN
Freq: Once | INTRAVENOUS | Status: AC
  Filled 2019-12-19: qty 250

## 2019-12-19 MED ORDER — DEXAMETHASONE SODIUM PHOSPHATE 10 MG/ML IJ SOLN
10.0000 mg | Freq: Once | INTRAMUSCULAR | Status: AC
  Administered 2019-12-19: 09:00:00 10 mg via INTRAVENOUS
  Filled 2019-12-19: qty 1

## 2019-12-19 MED ORDER — PALONOSETRON HCL INJECTION 0.25 MG/5ML
0.2500 mg | Freq: Once | INTRAVENOUS | Status: AC
  Administered 2019-12-19: 0.25 mg via INTRAVENOUS
  Filled 2019-12-19: qty 5

## 2019-12-19 MED ORDER — SODIUM CHLORIDE 0.9% FLUSH
10.0000 mL | Freq: Once | INTRAVENOUS | Status: AC
  Administered 2019-12-19: 08:00:00 10 mL via INTRAVENOUS
  Filled 2019-12-19: qty 10

## 2019-12-19 MED ORDER — SODIUM CHLORIDE 0.9 % IV SOLN: 10.0000 mg | Freq: Once | INTRAVENOUS | Status: DC

## 2019-12-19 MED ORDER — SODIUM CHLORIDE 0.9 % IV SOLN
291.2000 mg | Freq: Once | INTRAVENOUS | Status: AC
  Administered 2019-12-19: 11:00:00 290 mg via INTRAVENOUS
  Filled 2019-12-19: qty 29

## 2019-12-19 MED ORDER — DIPHENHYDRAMINE HCL 50 MG/ML IJ SOLN
50.0000 mg | Freq: Once | INTRAMUSCULAR | Status: AC
  Administered 2019-12-19: 50 mg via INTRAVENOUS
  Filled 2019-12-19: qty 1

## 2019-12-19 MED ORDER — HEPARIN SOD (PORK) LOCK FLUSH 100 UNIT/ML IV SOLN
INTRAVENOUS | Status: AC
  Filled 2019-12-19: qty 5

## 2019-12-19 MED ORDER — FAMOTIDINE IN NACL 20-0.9 MG/50ML-% IV SOLN
20.0000 mg | Freq: Once | INTRAVENOUS | Status: AC
  Administered 2019-12-19: 09:00:00 20 mg via INTRAVENOUS
  Filled 2019-12-19: qty 50

## 2019-12-19 MED ORDER — SODIUM CHLORIDE 0.9 % IV SOLN
45.0000 mg/m2 | Freq: Once | INTRAVENOUS | Status: AC
  Administered 2019-12-19: 09:00:00 84 mg via INTRAVENOUS
  Filled 2019-12-19: qty 14

## 2019-12-19 NOTE — Progress Notes (Signed)
HR upon arrival to clinic 130. Upon arriving to infusion HR rechecked and patient HR 104. Patient asymptomatic. Per Rulon Abide NP okay to proceed with treatment today

## 2019-12-19 NOTE — Progress Notes (Signed)
  Oncology Nurse Navigator Documentation  Navigator Location: [facility]Med Onc (12/19/19 0900)   )Navigator Encounter Type: Treatment (12/19/19 0900)                       Treatment Phase: Active Tx (12/19/19 0900) Barriers/Navigation Needs: No Barriers At This Time;No Needs;No Questions (12/19/19 0900)   Interventions: None Required (12/19/19 0900)               met with patient during chemotherapy treatment today to assess for any needs. Pt did not voice any needs or questions at this time. Instructed pt to call if has any future needs or questions. Pt verbalized understanding.       Time Spent with Patient: 15 (12/19/19 0900)

## 2019-12-23 ENCOUNTER — Ambulatory Visit
Admission: RE | Admit: 2019-12-23 | Discharge: 2019-12-23 | Disposition: A | Discharge status: Home / Self Care | Payer: 59 | Source: Ambulatory Visit | Attending: Radiation Oncology | Admitting: Radiation Oncology

## 2019-12-23 ENCOUNTER — Other Ambulatory Visit: Payer: Self-pay

## 2019-12-23 ENCOUNTER — Ambulatory Visit: Payer: 59 | Admitting: Pulmonary Disease

## 2019-12-23 DIAGNOSIS — C3411 Malignant neoplasm of upper lobe, right bronchus or lung: Secondary | ICD-10-CM | POA: Diagnosis not present

## 2019-12-24 ENCOUNTER — Other Ambulatory Visit: Payer: Self-pay

## 2019-12-24 ENCOUNTER — Ambulatory Visit
Admission: RE | Admit: 2019-12-24 | Discharge: 2019-12-24 | Disposition: A | Discharge status: Home / Self Care | Payer: 59 | Source: Ambulatory Visit | Attending: Radiation Oncology | Admitting: Radiation Oncology

## 2019-12-24 DIAGNOSIS — C3411 Malignant neoplasm of upper lobe, right bronchus or lung: Secondary | ICD-10-CM | POA: Diagnosis not present

## 2019-12-25 ENCOUNTER — Other Ambulatory Visit: Payer: Self-pay

## 2019-12-25 ENCOUNTER — Ambulatory Visit
Admission: RE | Admit: 2019-12-25 | Discharge: 2019-12-25 | Disposition: A | Discharge status: Home / Self Care | Payer: 59 | Source: Ambulatory Visit | Attending: Radiation Oncology | Admitting: Radiation Oncology

## 2019-12-25 DIAGNOSIS — C3411 Malignant neoplasm of upper lobe, right bronchus or lung: Secondary | ICD-10-CM | POA: Diagnosis not present

## 2019-12-26 ENCOUNTER — Inpatient Hospital Stay: Payer: 59

## 2019-12-26 ENCOUNTER — Inpatient Hospital Stay (HOSPITAL_BASED_OUTPATIENT_CLINIC_OR_DEPARTMENT_OTHER): Payer: 59 | Admitting: Oncology

## 2019-12-26 ENCOUNTER — Encounter: Payer: Self-pay | Admitting: Oncology

## 2019-12-26 ENCOUNTER — Other Ambulatory Visit: Payer: Self-pay

## 2019-12-26 ENCOUNTER — Ambulatory Visit
Admission: RE | Admit: 2019-12-26 | Discharge: 2019-12-26 | Disposition: A | Discharge status: Home / Self Care | Payer: 59 | Source: Ambulatory Visit | Attending: Radiation Oncology | Admitting: Radiation Oncology

## 2019-12-26 VITALS — BP 120/85 | HR 82 | Temp 98.5°F | Resp 18

## 2019-12-26 VITALS — BP 113/89 | HR 120 | Temp 98.1°F | Ht 71.0 in | Wt 161.0 lb

## 2019-12-26 DIAGNOSIS — C3411 Malignant neoplasm of upper lobe, right bronchus or lung: Secondary | ICD-10-CM | POA: Diagnosis not present

## 2019-12-26 DIAGNOSIS — C3412 Malignant neoplasm of upper lobe, left bronchus or lung: Secondary | ICD-10-CM

## 2019-12-26 DIAGNOSIS — Z5111 Encounter for antineoplastic chemotherapy: Secondary | ICD-10-CM | POA: Diagnosis not present

## 2019-12-26 LAB — CBC WITH DIFFERENTIAL/PLATELET
Abs Immature Granulocytes: 0.08 10*3/uL — ABNORMAL HIGH (ref 0.00–0.07)
Basophils Absolute: 0 10*3/uL (ref 0.0–0.1)
Basophils Relative: 1 %
Eosinophils Absolute: 0 10*3/uL (ref 0.0–0.5)
Eosinophils Relative: 1 %
HCT: 36.1 % — ABNORMAL LOW (ref 39.0–52.0)
Hemoglobin: 11.5 g/dL — ABNORMAL LOW (ref 13.0–17.0)
Immature Granulocytes: 2 %
Lymphocytes Relative: 15 %
Lymphs Abs: 0.8 10*3/uL (ref 0.7–4.0)
MCH: 28 pg (ref 26.0–34.0)
MCHC: 31.9 g/dL (ref 30.0–36.0)
MCV: 87.8 fL (ref 80.0–100.0)
Monocytes Absolute: 0.7 10*3/uL (ref 0.1–1.0)
Monocytes Relative: 14 %
Neutro Abs: 3.5 10*3/uL (ref 1.7–7.7)
Neutrophils Relative %: 67 %
Platelets: 420 10*3/uL — ABNORMAL HIGH (ref 150–400)
RBC: 4.11 MIL/uL — ABNORMAL LOW (ref 4.22–5.81)
RDW: 14.3 % (ref 11.5–15.5)
WBC: 5.2 10*3/uL (ref 4.0–10.5)
nRBC: 0 % (ref 0.0–0.2)

## 2019-12-26 LAB — COMPREHENSIVE METABOLIC PANEL
ALT: 58 U/L — ABNORMAL HIGH (ref 0–44)
AST: 33 U/L (ref 15–41)
Albumin: 3.2 g/dL — ABNORMAL LOW (ref 3.5–5.0)
Alkaline Phosphatase: 152 U/L — ABNORMAL HIGH (ref 38–126)
Anion gap: 11 (ref 5–15)
BUN: 17 mg/dL (ref 6–20)
CO2: 24 mmol/L (ref 22–32)
Calcium: 8.8 mg/dL — ABNORMAL LOW (ref 8.9–10.3)
Chloride: 99 mmol/L (ref 98–111)
Creatinine, Ser: 0.81 mg/dL (ref 0.61–1.24)
GFR calc Af Amer: >60 mL/min (ref >60)
GFR calc non Af Amer: >60 mL/min (ref >60)
Glucose, Bld: 110 mg/dL — ABNORMAL HIGH (ref 70–99)
Potassium: 3.8 mmol/L (ref 3.5–5.1)
Sodium: 134 mmol/L — ABNORMAL LOW (ref 135–145)
Total Bilirubin: 0.3 mg/dL (ref 0.3–1.2)
Total Protein: 7.4 g/dL (ref 6.5–8.1)

## 2019-12-26 MED ORDER — SODIUM CHLORIDE 0.9 % IV SOLN
288.2000 mg | Freq: Once | INTRAVENOUS | Status: AC
  Administered 2019-12-26: 290 mg via INTRAVENOUS
  Filled 2019-12-26: qty 29

## 2019-12-26 MED ORDER — HEPARIN SOD (PORK) LOCK FLUSH 100 UNIT/ML IV SOLN
INTRAVENOUS | Status: AC
  Filled 2019-12-26: qty 5

## 2019-12-26 MED ORDER — SODIUM CHLORIDE 0.9 % IV SOLN
45.0000 mg/m2 | Freq: Once | INTRAVENOUS | Status: AC
  Administered 2019-12-26: 11:00:00 84 mg via INTRAVENOUS
  Filled 2019-12-26: qty 14

## 2019-12-26 MED ORDER — SODIUM CHLORIDE 0.9 % IV SOLN
Freq: Once | INTRAVENOUS | Status: AC
  Filled 2019-12-26: qty 250

## 2019-12-26 MED ORDER — SODIUM CHLORIDE 0.9 % IV SOLN
10.0000 mg | Freq: Once | INTRAVENOUS | Status: AC
  Administered 2019-12-26: 10 mg via INTRAVENOUS
  Filled 2019-12-26: qty 1

## 2019-12-26 MED ORDER — PALONOSETRON HCL INJECTION 0.25 MG/5ML
0.2500 mg | Freq: Once | INTRAVENOUS | Status: AC
  Administered 2019-12-26: 0.25 mg via INTRAVENOUS
  Filled 2019-12-26: qty 5

## 2019-12-26 MED ORDER — FAMOTIDINE IN NACL 20-0.9 MG/50ML-% IV SOLN
20.0000 mg | Freq: Once | INTRAVENOUS | Status: AC
  Administered 2019-12-26: 20 mg via INTRAVENOUS
  Filled 2019-12-26: qty 50

## 2019-12-26 MED ORDER — HEPARIN SOD (PORK) LOCK FLUSH 100 UNIT/ML IV SOLN
500.0000 [IU] | Freq: Once | INTRAVENOUS | Status: AC | PRN
  Administered 2019-12-26: 500 [IU]
  Filled 2019-12-26: qty 5

## 2019-12-26 MED ORDER — DIPHENHYDRAMINE HCL 50 MG/ML IJ SOLN
50.0000 mg | Freq: Once | INTRAMUSCULAR | Status: AC
  Administered 2019-12-26: 50 mg via INTRAVENOUS
  Filled 2019-12-26: qty 1

## 2019-12-26 NOTE — Progress Notes (Signed)
Patient stated that his food tastes like "trash", therefore, he is not eating as before.

## 2019-12-26 NOTE — Progress Notes (Signed)
0920: Per Dr. Janese Banks okay to proceed with scheduled Taxol and Carboplatin treatment with HR of 120.  0922: HR rechecked, HR 105. Pt remains in stable condition with no complaints   1422: pt completed treatment, and reports that his right hand felt numb but has resolved. Pt denies any other symptoms, Beckey Rutter NP at chairside. Pt reports that it felt like when your hand falls asleep. Pt denies any symptoms or concerns at this time. Pt States hand feels normal and no complaints. No s/s of distress. VSS. Per Lauren NP okay to discharge pt home. Pt educated to call 911/report to ER in the event of an emgergency or call clinic with any questions or concerns, pt verbalizes understanding. Pt and VS stable at discharge.

## 2019-12-29 NOTE — Progress Notes (Signed)
Hematology/Oncology Consult note [facility]  Telephone:([phone] [phone]  Patient Care Team: Patient, No Pcp Per as PCP - General ([place]) Telford Nab, RN as Registered Nurse   Name of the patient: Kyle Guerrero  [id number]  [birth year]   Date of visit: 12/29/19  Diagnosis- Non-small cell lung cancer stage IV acT2 cN2 cM1 a with pleural involvement  Chief complaint/ Reason for visit- on treatment assessment prior to cycle 4 of weekly carbo/taxol chemotherapy  Heme/Onc history: patient is a 48 year old male who presented to the ER with symptoms ofheaviness in his chest and upper left chest discomfort he underwent CT angio chest to rule out PE which showed 3.8 x 3.3 cm left upper palpable lung mass along with 4.4 x 3.3 cm lobulated mass in the aortopulmonary window and 2.6 x 2.4 cm left hilar mass all concerning for malignancy. Patient has also seen pulmonary and has been set up for bronchoscopy and EBUS guided biopsy on 11/23/2019. Patient underwent. PET CT scan which showed a hypermetabolic spiculated 3.5 cm of 5 left upper lobe lung mass, adjacent hypermetabolic 3.2 x 1.2 cm pleural metastases in the medial posterior by the left pleural space along with scalloping of the adjacent posterior left third rib. Hypermetabolic infiltrative left perihilar conglomerate nodal metastases measuring up to 7.3 x 3.6 cm and 0.8 cm high left mediastinal node between the left brachiocephalic vein and left subclavian artery. No evidence of distant metastatic disease  Biopsy showed non-small cell lung cancer but further characterization could not be determined. Insufficient tissue for NGS testing. Repeat biopsy done. Results of NGS pending  Interval history- feels fatigued but denies other complaints. Appetite is good. Weight has remained stable  ECOG PS- 1 Pain scale- 0   Review of systems- Review of Systems  Constitutional: Positive for  malaise/fatigue. Negative for chills, fever and weight loss.  HENT: Negative for congestion, ear discharge and nosebleeds.   Eyes: Negative for blurred vision.  Respiratory: Negative for cough, hemoptysis, sputum production, shortness of breath and wheezing.   Cardiovascular: Negative for chest pain, palpitations, orthopnea and claudication.  Gastrointestinal: Negative for abdominal pain, blood in stool, constipation, diarrhea, heartburn, melena, nausea and vomiting.  Genitourinary: Negative for dysuria, flank pain, frequency, hematuria and urgency.  Musculoskeletal: Negative for back pain, joint pain and myalgias.  Skin: Negative for rash.  Neurological: Negative for dizziness, tingling, focal weakness, seizures, weakness and headaches.  Endo/Heme/Allergies: Does not bruise/bleed easily.  Psychiatric/Behavioral: Negative for depression and suicidal ideas. The patient does not have insomnia.       No Known Allergies   History reviewed. No pertinent past medical history.   Past Surgical History:  Procedure Laterality Date  . CYST EXCISION    . IR IMAGING GUIDED PORT INSERTION  12/05/2019  . VIDEO BRONCHOSCOPY WITH ENDOBRONCHIAL ULTRASOUND Left 11/22/2019   Procedure: VIDEO BRONCHOSCOPY WITH ENDOBRONCHIAL ULTRASOUND;  Surgeon: Tyler Pita, MD;  Location: [facility] ORS;  Service: Thoracic;  Laterality: Left;    Social History   Socioeconomic History  . Marital status: Single    Spouse name: Not on file  . Number of children: Not on file  . Years of education: Not on file  . Highest education level: Not on file  Occupational History  . Not on file  Tobacco Use  . Smoking status: Former Smoker    Packs/day: 2.00    Types: Cigarettes    Quit date: 11/08/2019    Years since quitting: 0.1  .  Smokeless tobacco: Never Used  Substance and Sexual Activity  . Alcohol use: Not Currently  . Drug use: Yes    Types: Marijuana  . Sexual activity: Not on file  Other Topics Concern   . Not on file  Social History Narrative  . Not on file   Social Determinants of Health   Financial Resource Strain:   . Difficulty of Paying Living Expenses: Not on file  Food Insecurity:   . Worried About Charity fundraiser in the Last Year: Not on file  . Ran Out of Food in the Last Year: Not on file  Transportation Needs:   . Lack of Transportation (Medical): Not on file  . Lack of Transportation (Non-Medical): Not on file  Physical Activity:   . Days of Exercise per Week: Not on file  . Minutes of Exercise per Session: Not on file  Stress:   . Feeling of Stress : Not on file  Social Connections:   . Frequency of Communication with Friends and Family: Not on file  . Frequency of Social Gatherings with Friends and Family: Not on file  . Attends Religious Services: Not on file  . Active Member of Clubs or Organizations: Not on file  . Attends Archivist Meetings: Not on file  . Marital Status: Not on file  Intimate Partner Violence:   . Fear of Current or Ex-Partner: Not on file  . Emotionally Abused: Not on file  . Physically Abused: Not on file  . Sexually Abused: Not on file    Family History  Problem Relation Age of Onset  . Healthy Mother   . Healthy Father      Current Outpatient Medications:  .  azelastine (ASTELIN) 0.1 % nasal spray, Place 1 spray into the nose 2 (two) times daily., Disp: , Rfl:  .  dexamethasone (DECADRON) 4 MG tablet, Take 2 tablets (8 mg total) by mouth daily. Start the day after chemotherapy for 2 days., Disp: 30 tablet, Rfl: 1 .  lidocaine-prilocaine (EMLA) cream, Apply to affected area once, Disp: 30 g, Rfl: 3 .  NEOMYCIN-POLYMYXIN-HYDROCORTISONE (CORTISPORIN) 1 % SOLN OTIC solution, 3 drops 4 (four) times daily., Disp: , Rfl:  .  ondansetron (ZOFRAN) 8 MG tablet, Take 1 tablet (8 mg total) by mouth 2 (two) times daily as needed for refractory nausea / vomiting. Start on day 3 after chemo., Disp: 30 tablet, Rfl: 1 .   prochlorperazine (COMPAZINE) 10 MG tablet, Take 1 tablet (10 mg total) by mouth every 6 (six) hours as needed (Nausea or vomiting)., Disp: 30 tablet, Rfl: 1  Physical exam:  Vitals:   12/26/19 0841  BP: 113/89  Pulse: (!) 120  Temp: 98.1 F (36.7 C)  TempSrc: Tympanic  SpO2: 100%  Weight: 161 lb (73 kg)  Height: 5\' 11"  (1.803 m)   Physical Exam HENT:     Head: Normocephalic and atraumatic.  Eyes:     Pupils: Pupils are equal, round, and reactive to light.  Cardiovascular:     Rate and Rhythm: Regular rhythm. Tachycardia present.     Heart sounds: Normal heart sounds.  Pulmonary:     Effort: Pulmonary effort is normal.     Breath sounds: Normal breath sounds.  Abdominal:     General: Bowel sounds are normal.     Palpations: Abdomen is soft.  Musculoskeletal:     Cervical back: Normal range of motion.  Skin:    General: Skin is warm and dry.  Neurological:  Mental Status: He is alert and oriented to person, place, and time.      CMP Latest Ref Rng & Units 12/26/2019  Glucose 70 - 99 mg/dL 110(H)  BUN 6 - 20 mg/dL 17  Creatinine 0.61 - 1.24 mg/dL 0.81  Sodium 135 - 145 mmol/L 134(L)  Potassium 3.5 - 5.1 mmol/L 3.8  Chloride 98 - 111 mmol/L 99  CO2 22 - 32 mmol/L 24  Calcium 8.9 - 10.3 mg/dL 8.8(L)  Total Protein 6.5 - 8.1 g/dL 7.4  Total Bilirubin 0.3 - 1.2 mg/dL 0.3  Alkaline Phos 38 - 126 U/L 152(H)  AST 15 - 41 U/L 33  ALT 0 - 44 U/L 58(H)   CBC Latest Ref Rng & Units 12/26/2019  WBC 4.0 - 10.5 K/uL 5.2  Hemoglobin 13.0 - 17.0 g/dL 11.5(L)  Hematocrit 39.0 - 52.0 % 36.1(L)  Platelets 150 - 400 K/uL 420(H)    No images are attached to the encounter.  MR Brain W Wo Contrast  Result Date: 12/02/2019 CLINICAL DATA:  Lung mass. EXAM: MRI HEAD WITHOUT AND WITH CONTRAST TECHNIQUE: Multiplanar, multiecho pulse sequences of the brain and surrounding structures were obtained without and with intravenous contrast. CONTRAST:  9mL GADAVIST GADOBUTROL 1 MMOL/ML IV  SOLN COMPARISON:  Nuclear medicine PET scan 11/18/2019 FINDINGS: Brain: There is no evidence of acute infarct. No midline shift or extra-axial fluid collection. No chronic intracranial blood products. Small foci of cortical encephalomalacia within the anterior left frontal lobe and left frontal operculum, which may reflect chronic infarcts (series 15, image 37) (series 15, image 30). Mild scattered T2/FLAIR hyperintensity within the cerebral white matter is nonspecific, but consistent with chronic small vessel ischemic disease. Cerebral volume is normal for age. Small developmental venous anomaly within the left cerebellum. No other abnormal intracranial enhancement is demonstrated to suggest intracranial metastatic disease. Vascular: Flow voids maintained within the proximal large arterial vessels. Skull and upper cervical spine: There is an enhancing lesion within the midline frontal calvarium, measuring 0.8 x 1.1 x 1.0 cm (series 18, image 100) (series 20, image 13). Associated scalloping of the calvarium at this site. No other focal marrow lesion identified. Sinuses/Orbits: Visualized orbits demonstrate no acute abnormality. Minimal mucosal thickening within bilateral ethmoid and right maxillary sinuses. No significant mastoid effusion. IMPRESSION: 1. 1.1 cm enhancing lesion within the midline frontal calvarium, which may reflect a calvarial metastasis or intra-diploic meningioma. Attention recommended on follow-up. 2. No evidence of intracranial metastatic disease. 3. Small foci of cortical encephalomalacia within the left frontal lobe, which may reflect chronic infarcts. 4. Mild chronic small vessel ischemic disease. Electronically Signed   By: Kellie Simmering DO   On: 12/02/2019 20:52   CT Biopsy  Result Date: 12/05/2019 INDICATION: 49 year old with lung cancer but needs additional tissue for treatment planning. EXAM: CT-GUIDED LEFT LUNG MASS BIOPSY MEDICATIONS: None. ANESTHESIA/SEDATION: Moderate  (conscious) sedation was employed during this procedure. A total of Versed 1.5 mg and Fentanyl 75 mcg was administered intravenously. Moderate Sedation Time: 18 minutes. The patient's level of consciousness and vital signs were monitored continuously by radiology nursing throughout the procedure under my direct supervision. FLUOROSCOPY TIME:  None COMPLICATIONS: None immediate. PROCEDURE: Informed written consent was obtained from the patient after a thorough discussion of the procedural risks, benefits and alternatives. All questions were addressed. A timeout was performed prior to the initiation of the procedure. Patient was placed prone. CT images through the upper chest were obtained. Mass in the posterior left upper lobe was targeted for  biopsy. Left side of the back was prepped with chlorhexidine and sterile field was created. Skin and soft tissues were anesthetized with 1% lidocaine. Using CT guidance, a 17 gauge coaxial needle was directed into the lateral aspect of the lesion. 18 gauge core biopsies were obtained. Specimens placed in formalin. Needle was removed using a BioSentry tract sealant. Bandage placed over the puncture site. FINDINGS: Spiculated mass in the left upper lobe. Needle was directed into the lateral aspect of the mass because this area was hypermetabolic on previous PET-CT. Adequate core specimens were obtained. No significant pneumothorax on the post biopsy images. IMPRESSION: Successful CT-guided core biopsies of the left upper lobe lung mass. Electronically Signed   By: Markus Daft M.D.   On: 12/05/2019 14:47   DG Chest Port 1 View  Result Date: 12/05/2019 CLINICAL DATA:  Status post port placement EXAM: PORTABLE CHEST 1 VIEW COMPARISON:  11/22/2019 FINDINGS: There is a right-sided Port-A-Cath with the tip projecting over the SVC. There is no focal consolidation. There is no pleural effusion or pneumothorax. There is a left suprahilar mass. The heart size is normal. There is no  acute osseous abnormality. IMPRESSION: 1. Right-sided Port-A-Cath in satisfactory position. 2. Left suprahilar mass most concerning for malignancy. Electronically Signed   By: Kathreen Devoid   On: 12/05/2019 12:26   IR IMAGING GUIDED PORT INSERTION  Result Date: 12/05/2019 INDICATION: 49 year old with lung cancer.  Port-A-Cath needed for chemotherapy. EXAM: FLUOROSCOPIC AND ULTRASOUND GUIDED PLACEMENT OF A SUBCUTANEOUS PORT COMPARISON:  None. MEDICATIONS: Ancef 2 g; The antibiotic was administered within an appropriate time interval prior to skin puncture. ANESTHESIA/SEDATION: Versed 1.5 mg IV; Fentanyl 75 mcg IV; Moderate Sedation Time:  33 minutes The patient was continuously monitored during the procedure by the interventional radiology nurse under my direct supervision. FLUOROSCOPY TIME:  24 seconds, 1 mGy COMPLICATIONS: None immediate. PROCEDURE: The procedure, risks, benefits, and alternatives were explained to the patient. Questions regarding the procedure were encouraged and answered. The patient understands and consents to the procedure. Patient was placed supine on the interventional table. Ultrasound confirmed a patent right internal jugular vein. Ultrasound image was saved for documentation. The right chest and neck were cleaned with a skin antiseptic and a sterile drape was placed. Maximal barrier sterile technique was utilized including caps, mask, sterile gowns, sterile gloves, sterile drape, hand hygiene and skin antiseptic. The right neck was anesthetized with 1% lidocaine. Small incision was made in the right neck with a blade. Micropuncture set was placed in the right internal jugular vein with ultrasound guidance. The micropuncture wire was used for measurement purposes. The right chest was anesthetized with 1% lidocaine with epinephrine. #15 blade was used to make an incision and a subcutaneous port pocket was formed. [place] was assembled. Subcutaneous tunnel was formed with a  stiff tunneling device. The port catheter was brought through the subcutaneous tunnel. The port was placed in the subcutaneous pocket. The micropuncture set was exchanged for a peel-away sheath. The catheter was placed through the peel-away sheath and the tip was positioned at the SVC and right atrium junction. Catheter placement was confirmed with fluoroscopy. The port was accessed and flushed with heparinized saline. The port pocket was closed using two layers of absorbable sutures and Dermabond. The vein skin site was closed using a single layer of absorbable suture and Dermabond. Sterile dressings were applied. Patient tolerated the procedure well without an immediate complication. Ultrasound and fluoroscopic images were taken and saved for this  procedure. IMPRESSION: Placement of a subcutaneous port device. Catheter tip at the SVC and right atrium junction. Electronically Signed   By: Markus Daft M.D.   On: 12/05/2019 09:24     Assessment and plan- Patient is a 49 y.o. male stage IVa non-small cell lung cancer cT2 cN2 cM1a. He is here for on treatment assessment prior to cycle 4 of weekly carbo/taxol chemotherapy  Counts ok to proceed with cycle 4 of weekly carbo/ taxol chemotherapy today. He will directly proceed with cycle 5 next week and I will see him back in 2 weeks for cycle 6.  Plan to start immunotherapy after concurrent/chemo RT. NGS pending  Sinus tachycardia- continue to monitor   Visit Diagnosis 1. Encounter for antineoplastic chemotherapy   2. Malignant neoplasm of upper lobe of left lung ([place])      Dr. Randa Evens, MD, MPH [facility] at [facility] [phone] 12/29/2019 6:28 PM

## 2019-12-30 ENCOUNTER — Other Ambulatory Visit: Payer: Self-pay

## 2019-12-30 ENCOUNTER — Ambulatory Visit
Admission: RE | Admit: 2019-12-30 | Discharge: 2019-12-30 | Disposition: A | Discharge status: Home / Self Care | Payer: 59 | Source: Ambulatory Visit | Attending: Radiation Oncology | Admitting: Radiation Oncology

## 2019-12-30 DIAGNOSIS — C3412 Malignant neoplasm of upper lobe, left bronchus or lung: Secondary | ICD-10-CM | POA: Diagnosis present

## 2019-12-30 DIAGNOSIS — Z510 Encounter for antineoplastic radiation therapy: Secondary | ICD-10-CM | POA: Insufficient documentation

## 2019-12-31 ENCOUNTER — Other Ambulatory Visit: Payer: Self-pay | Admitting: *Deleted

## 2019-12-31 ENCOUNTER — Other Ambulatory Visit: Payer: Self-pay

## 2019-12-31 ENCOUNTER — Ambulatory Visit
Admission: RE | Admit: 2019-12-31 | Discharge: 2019-12-31 | Disposition: A | Discharge status: Home / Self Care | Payer: 59 | Source: Ambulatory Visit | Attending: Radiation Oncology | Admitting: Radiation Oncology

## 2019-12-31 DIAGNOSIS — C3412 Malignant neoplasm of upper lobe, left bronchus or lung: Secondary | ICD-10-CM | POA: Diagnosis not present

## 2019-12-31 MED ORDER — SUCRALFATE 1 G PO TABS: 1.0000 g | ORAL_TABLET | Freq: Three times a day (TID) | ORAL | 6 refills | Status: DC

## 2020-01-01 ENCOUNTER — Ambulatory Visit
Admission: RE | Admit: 2020-01-01 | Discharge: 2020-01-01 | Disposition: A | Discharge status: Home / Self Care | Payer: 59 | Source: Ambulatory Visit | Attending: Radiation Oncology | Admitting: Radiation Oncology

## 2020-01-01 ENCOUNTER — Other Ambulatory Visit: Payer: Self-pay

## 2020-01-01 DIAGNOSIS — C3412 Malignant neoplasm of upper lobe, left bronchus or lung: Secondary | ICD-10-CM | POA: Diagnosis not present

## 2020-01-02 ENCOUNTER — Ambulatory Visit
Admission: RE | Admit: 2020-01-02 | Discharge: 2020-01-02 | Disposition: A | Discharge status: Home / Self Care | Payer: 59 | Source: Ambulatory Visit | Attending: Radiation Oncology | Admitting: Radiation Oncology

## 2020-01-02 ENCOUNTER — Inpatient Hospital Stay: Payer: 59

## 2020-01-02 ENCOUNTER — Other Ambulatory Visit: Payer: Self-pay

## 2020-01-02 ENCOUNTER — Inpatient Hospital Stay: Payer: 59 | Attending: Oncology

## 2020-01-02 VITALS — BP 117/76 | HR 80 | Temp 97.6°F | Resp 18 | Wt 166.5 lb

## 2020-01-02 DIAGNOSIS — D701 Agranulocytosis secondary to cancer chemotherapy: Secondary | ICD-10-CM | POA: Insufficient documentation

## 2020-01-02 DIAGNOSIS — R5383 Other fatigue: Secondary | ICD-10-CM | POA: Insufficient documentation

## 2020-01-02 DIAGNOSIS — Z7952 Long term (current) use of systemic steroids: Secondary | ICD-10-CM | POA: Insufficient documentation

## 2020-01-02 DIAGNOSIS — C3412 Malignant neoplasm of upper lobe, left bronchus or lung: Secondary | ICD-10-CM | POA: Diagnosis present

## 2020-01-02 DIAGNOSIS — Z5111 Encounter for antineoplastic chemotherapy: Secondary | ICD-10-CM | POA: Diagnosis not present

## 2020-01-02 DIAGNOSIS — R945 Abnormal results of liver function studies: Secondary | ICD-10-CM | POA: Insufficient documentation

## 2020-01-02 DIAGNOSIS — T451X5A Adverse effect of antineoplastic and immunosuppressive drugs, initial encounter: Secondary | ICD-10-CM | POA: Insufficient documentation

## 2020-01-02 DIAGNOSIS — Z87891 Personal history of nicotine dependence: Secondary | ICD-10-CM | POA: Diagnosis not present

## 2020-01-02 DIAGNOSIS — C782 Secondary malignant neoplasm of pleura: Secondary | ICD-10-CM | POA: Diagnosis not present

## 2020-01-02 LAB — CBC WITH DIFFERENTIAL/PLATELET
Abs Immature Granulocytes: 0.05 10*3/uL (ref 0.00–0.07)
Basophils Absolute: 0 10*3/uL (ref 0.0–0.1)
Basophils Relative: 1 %
Eosinophils Absolute: 0 10*3/uL (ref 0.0–0.5)
Eosinophils Relative: 1 %
HCT: 35.4 % — ABNORMAL LOW (ref 39.0–52.0)
Hemoglobin: 11.2 g/dL — ABNORMAL LOW (ref 13.0–17.0)
Immature Granulocytes: 1 %
Lymphocytes Relative: 18 %
Lymphs Abs: 0.7 10*3/uL (ref 0.7–4.0)
MCH: 27.9 pg (ref 26.0–34.0)
MCHC: 31.6 g/dL (ref 30.0–36.0)
MCV: 88.3 fL (ref 80.0–100.0)
Monocytes Absolute: 0.6 10*3/uL (ref 0.1–1.0)
Monocytes Relative: 14 %
Neutro Abs: 2.6 10*3/uL (ref 1.7–7.7)
Neutrophils Relative %: 65 %
Platelets: 307 10*3/uL (ref 150–400)
RBC: 4.01 MIL/uL — ABNORMAL LOW (ref 4.22–5.81)
RDW: 14.7 % (ref 11.5–15.5)
WBC: 4 10*3/uL (ref 4.0–10.5)
nRBC: 0 % (ref 0.0–0.2)

## 2020-01-02 LAB — COMPREHENSIVE METABOLIC PANEL
ALT: 63 U/L — ABNORMAL HIGH (ref 0–44)
AST: 34 U/L (ref 15–41)
Albumin: 3.2 g/dL — ABNORMAL LOW (ref 3.5–5.0)
Alkaline Phosphatase: 146 U/L — ABNORMAL HIGH (ref 38–126)
Anion gap: 7 (ref 5–15)
BUN: 15 mg/dL (ref 6–20)
CO2: 26 mmol/L (ref 22–32)
Calcium: 8.9 mg/dL (ref 8.9–10.3)
Chloride: 102 mmol/L (ref 98–111)
Creatinine, Ser: 0.77 mg/dL (ref 0.61–1.24)
GFR calc Af Amer: >60 mL/min (ref >60)
GFR calc non Af Amer: >60 mL/min (ref >60)
Glucose, Bld: 92 mg/dL (ref 70–99)
Potassium: 4 mmol/L (ref 3.5–5.1)
Sodium: 135 mmol/L (ref 135–145)
Total Bilirubin: 0.3 mg/dL (ref 0.3–1.2)
Total Protein: 7.3 g/dL (ref 6.5–8.1)

## 2020-01-02 MED ORDER — PALONOSETRON HCL INJECTION 0.25 MG/5ML
0.2500 mg | Freq: Once | INTRAVENOUS | Status: AC
  Administered 2020-01-02: 0.25 mg via INTRAVENOUS
  Filled 2020-01-02: qty 5

## 2020-01-02 MED ORDER — SODIUM CHLORIDE 0.9% FLUSH
10.0000 mL | Freq: Once | INTRAVENOUS | Status: AC
  Administered 2020-01-02: 10 mL via INTRAVENOUS
  Filled 2020-01-02: qty 10

## 2020-01-02 MED ORDER — SODIUM CHLORIDE 0.9 % IV SOLN
291.2000 mg | Freq: Once | INTRAVENOUS | Status: AC
  Administered 2020-01-02: 290 mg via INTRAVENOUS
  Filled 2020-01-02: qty 29

## 2020-01-02 MED ORDER — SODIUM CHLORIDE 0.9 % IV SOLN
45.0000 mg/m2 | Freq: Once | INTRAVENOUS | Status: AC
  Administered 2020-01-02: 84 mg via INTRAVENOUS
  Filled 2020-01-02: qty 14

## 2020-01-02 MED ORDER — HEPARIN SOD (PORK) LOCK FLUSH 100 UNIT/ML IV SOLN
500.0000 [IU] | Freq: Once | INTRAVENOUS | Status: AC | PRN
  Administered 2020-01-02: 500 [IU]
  Filled 2020-01-02: qty 5

## 2020-01-02 MED ORDER — DIPHENHYDRAMINE HCL 50 MG/ML IJ SOLN
50.0000 mg | Freq: Once | INTRAMUSCULAR | Status: AC
  Administered 2020-01-02: 50 mg via INTRAVENOUS
  Filled 2020-01-02: qty 1

## 2020-01-02 MED ORDER — SODIUM CHLORIDE 0.9 % IV SOLN
Freq: Once | INTRAVENOUS | Status: AC
  Filled 2020-01-02: qty 250

## 2020-01-02 MED ORDER — SODIUM CHLORIDE 0.9 % IV SOLN
10.0000 mg | Freq: Once | INTRAVENOUS | Status: AC
  Administered 2020-01-02: 10 mg via INTRAVENOUS
  Filled 2020-01-02: qty 10

## 2020-01-02 MED ORDER — FAMOTIDINE IN NACL 20-0.9 MG/50ML-% IV SOLN
20.0000 mg | Freq: Once | INTRAVENOUS | Status: AC
  Administered 2020-01-02: 20 mg via INTRAVENOUS
  Filled 2020-01-02: qty 50

## 2020-01-03 ENCOUNTER — Other Ambulatory Visit: Payer: Self-pay

## 2020-01-03 ENCOUNTER — Ambulatory Visit
Admission: RE | Admit: 2020-01-03 | Discharge: 2020-01-03 | Disposition: A | Discharge status: Home / Self Care | Payer: 59 | Source: Ambulatory Visit | Attending: Radiation Oncology | Admitting: Radiation Oncology

## 2020-01-03 ENCOUNTER — Telehealth: Payer: Self-pay | Admitting: *Deleted

## 2020-01-03 DIAGNOSIS — C3412 Malignant neoplasm of upper lobe, left bronchus or lung: Secondary | ICD-10-CM | POA: Diagnosis not present

## 2020-01-03 NOTE — Telephone Encounter (Signed)
rcvd request from [facility] to get PRior auth for cpt code 81301 and COT [id number]- which is micro array genetic testing . I spoke to raymond at [facility] and the first code is approved  Confirmation # [id number].  The second CPT [id number] does not require prior auth so it is covered per [employer]. Faxed the approval to [facility] attnAndi Hence Fax # [phone]. Phone # [phone]

## 2020-01-06 ENCOUNTER — Other Ambulatory Visit: Payer: Self-pay

## 2020-01-06 ENCOUNTER — Ambulatory Visit (INDEPENDENT_AMBULATORY_CARE_PROVIDER_SITE_OTHER): Payer: 59 | Admitting: Pulmonary Disease

## 2020-01-06 ENCOUNTER — Encounter: Payer: Self-pay | Admitting: Pulmonary Disease

## 2020-01-06 ENCOUNTER — Ambulatory Visit
Admission: RE | Admit: 2020-01-06 | Discharge: 2020-01-06 | Disposition: A | Discharge status: Home / Self Care | Payer: 59 | Source: Ambulatory Visit | Attending: Radiation Oncology | Admitting: Radiation Oncology

## 2020-01-06 DIAGNOSIS — C3492 Malignant neoplasm of unspecified part of left bronchus or lung: Secondary | ICD-10-CM | POA: Diagnosis not present

## 2020-01-06 DIAGNOSIS — C3412 Malignant neoplasm of upper lobe, left bronchus or lung: Secondary | ICD-10-CM | POA: Diagnosis not present

## 2020-01-06 DIAGNOSIS — R042 Hemoptysis: Secondary | ICD-10-CM | POA: Diagnosis not present

## 2020-01-06 NOTE — Patient Instructions (Signed)
1.  For now we will leave your follow-ups as needed.  Continue your oncology follow-ups with Dr. Janese Banks and Dr. Baruch Gouty.

## 2020-01-06 NOTE — Progress Notes (Signed)
   Subjective:    Patient ID: Kyle Guerrero, male    DOB: 1971-08-11, 49 y.o.   MRN: [id number] Virtual Visit Via Video or Telephone Note:   This visit type was conducted due to national recommendations for restrictions regarding the COVID-19 pandemic .  This format is felt to be most appropriate for this patient at this time.  All issues noted in this document were discussed and addressed.  No physical exam was performed (except for noted visual exam findings with Video Visits).    I connected with Kyle Guerrero by telephone at 08:30 hours and verified that I was speaking with the correct person using two identifiers. Location patient: home Location provider: [facility] Persons participating in the virtual visit: patient, physician   I discussed the limitations, risks, security and privacy concerns of performing an evaluation and management service by video and the availability of in person appointments. The patient expressed understanding and agreed to proceed.  HPI Kyle Guerrero is a 49 year old recent former smoker (November 2020) whom we are following with via telephone for the issue of LEFT lung mass and mediastinal mass/adenopathy.  He underwent bronchoscopy for diagnosis on 27 November.  Patient was diagnosed with non-small cell carcinoma favor adenocarcinoma.  Second biopsy performed on 10 December confirmed adenocarcinoma.  Second biopsy was obtained for tissue molecular studies.  A liquid biopsy was also sent on 7 December results which have shown an NTKR gene fusion (seen only on 0.1 to 3.3% of lung cancers).  These results have been shared with Dr. Janese Banks.  Since our prior telephone visit on 4 December, the patient has noted improvement on shortness of breath and has had no further hemoptysis.  He is currently receiving chemoradiation.  He also notes that he has started to gain some weight.  He has not had any orthopnea, paroxysmal nocturnal dyspnea or lower  extremity edema.  Previously he was also having issues with chest pain but this is now well controlled.  Overall he feels that he is doing better.  Review of Systems A 10 point review of systems was performed and it is as noted above otherwise negative.    Objective:   Physical Exam  No physical exam was performed today due to this being a phone visit.  Patient was able to complete sentences without difficulty.  Speech was clear.  Voice did not seem distressed      Assessment & Plan:   1.  Non-small cell carcinoma of the lung, LEFT:  NTKR gene fusion detected.  Patient symptoms of hemoptysis and shortness of breath are improved since starting radiation and Taxol/carboplatin treatment for his lung cancer.  We will see him in follow-up on an as-needed basis as he currently needs to concentrate on his oncologic care.  2.  Hemoptysis:  This issue has resolved with radiation therapy.   We will see the patient in follow-up on an as needed basis.  Total time of visit: 15 minutes.   Renold Don, MD [facility]   This note was dictated using voice recognition software/Dragon.  Despite best efforts to proofread, errors can occur which can change the meaning.  Any change was purely unintentional.

## 2020-01-07 ENCOUNTER — Other Ambulatory Visit: Payer: Self-pay

## 2020-01-07 ENCOUNTER — Ambulatory Visit
Admission: RE | Admit: 2020-01-07 | Discharge: 2020-01-07 | Disposition: A | Discharge status: Home / Self Care | Payer: 59 | Source: Ambulatory Visit | Attending: Radiation Oncology | Admitting: Radiation Oncology

## 2020-01-07 DIAGNOSIS — C3412 Malignant neoplasm of upper lobe, left bronchus or lung: Secondary | ICD-10-CM | POA: Diagnosis not present

## 2020-01-08 ENCOUNTER — Ambulatory Visit
Admission: RE | Admit: 2020-01-08 | Discharge: 2020-01-08 | Disposition: A | Discharge status: Home / Self Care | Payer: 59 | Source: Ambulatory Visit | Attending: Radiation Oncology | Admitting: Radiation Oncology

## 2020-01-08 ENCOUNTER — Other Ambulatory Visit: Payer: Self-pay

## 2020-01-08 DIAGNOSIS — C3412 Malignant neoplasm of upper lobe, left bronchus or lung: Secondary | ICD-10-CM | POA: Diagnosis not present

## 2020-01-08 NOTE — Progress Notes (Signed)
Left message to call back  

## 2020-01-09 ENCOUNTER — Other Ambulatory Visit: Payer: Self-pay

## 2020-01-09 ENCOUNTER — Encounter: Payer: Self-pay | Admitting: Oncology

## 2020-01-09 ENCOUNTER — Inpatient Hospital Stay: Payer: 59

## 2020-01-09 ENCOUNTER — Ambulatory Visit
Admission: RE | Admit: 2020-01-09 | Discharge: 2020-01-09 | Disposition: A | Discharge status: Home / Self Care | Payer: 59 | Source: Ambulatory Visit | Attending: Radiation Oncology | Admitting: Radiation Oncology

## 2020-01-09 ENCOUNTER — Inpatient Hospital Stay (HOSPITAL_BASED_OUTPATIENT_CLINIC_OR_DEPARTMENT_OTHER): Payer: 59 | Admitting: Oncology

## 2020-01-09 VITALS — Resp 18

## 2020-01-09 VITALS — BP 133/90 | HR 76 | Temp 96.2°F | Ht 71.0 in | Wt 167.0 lb

## 2020-01-09 DIAGNOSIS — R945 Abnormal results of liver function studies: Secondary | ICD-10-CM

## 2020-01-09 DIAGNOSIS — C3412 Malignant neoplasm of upper lobe, left bronchus or lung: Secondary | ICD-10-CM | POA: Diagnosis not present

## 2020-01-09 DIAGNOSIS — Z5111 Encounter for antineoplastic chemotherapy: Secondary | ICD-10-CM

## 2020-01-09 DIAGNOSIS — Z95828 Presence of other vascular implants and grafts: Secondary | ICD-10-CM

## 2020-01-09 DIAGNOSIS — R7989 Other specified abnormal findings of blood chemistry: Secondary | ICD-10-CM

## 2020-01-09 LAB — COMPREHENSIVE METABOLIC PANEL
ALT: 63 U/L — ABNORMAL HIGH (ref 0–44)
AST: 30 U/L (ref 15–41)
Albumin: 3.5 g/dL (ref 3.5–5.0)
Alkaline Phosphatase: 139 U/L — ABNORMAL HIGH (ref 38–126)
Anion gap: 10 (ref 5–15)
BUN: 18 mg/dL (ref 6–20)
CO2: 25 mmol/L (ref 22–32)
Calcium: 9.1 mg/dL (ref 8.9–10.3)
Chloride: 101 mmol/L (ref 98–111)
Creatinine, Ser: 0.66 mg/dL (ref 0.61–1.24)
GFR calc Af Amer: >60 mL/min (ref >60)
GFR calc non Af Amer: >60 mL/min (ref >60)
Glucose, Bld: 96 mg/dL (ref 70–99)
Potassium: 3.9 mmol/L (ref 3.5–5.1)
Sodium: 136 mmol/L (ref 135–145)
Total Bilirubin: 0.4 mg/dL (ref 0.3–1.2)
Total Protein: 7.6 g/dL (ref 6.5–8.1)

## 2020-01-09 LAB — CBC WITH DIFFERENTIAL/PLATELET
Abs Immature Granulocytes: 0.06 10*3/uL (ref 0.00–0.07)
Basophils Absolute: 0 10*3/uL (ref 0.0–0.1)
Basophils Relative: 1 %
Eosinophils Absolute: 0.1 10*3/uL (ref 0.0–0.5)
Eosinophils Relative: 1 %
HCT: 35.2 % — ABNORMAL LOW (ref 39.0–52.0)
Hemoglobin: 11.5 g/dL — ABNORMAL LOW (ref 13.0–17.0)
Immature Granulocytes: 2 %
Lymphocytes Relative: 22 %
Lymphs Abs: 0.8 10*3/uL (ref 0.7–4.0)
MCH: 28.7 pg (ref 26.0–34.0)
MCHC: 32.7 g/dL (ref 30.0–36.0)
MCV: 87.8 fL (ref 80.0–100.0)
Monocytes Absolute: 0.5 10*3/uL (ref 0.1–1.0)
Monocytes Relative: 15 %
Neutro Abs: 2.1 10*3/uL (ref 1.7–7.7)
Neutrophils Relative %: 59 %
Platelets: 255 10*3/uL (ref 150–400)
RBC: 4.01 MIL/uL — ABNORMAL LOW (ref 4.22–5.81)
RDW: 15.1 % (ref 11.5–15.5)
WBC: 3.5 10*3/uL — ABNORMAL LOW (ref 4.0–10.5)
nRBC: 0 % (ref 0.0–0.2)

## 2020-01-09 MED ORDER — SODIUM CHLORIDE 0.9% FLUSH
10.0000 mL | Freq: Once | INTRAVENOUS | Status: AC
  Administered 2020-01-09: 10 mL via INTRAVENOUS
  Filled 2020-01-09: qty 10

## 2020-01-09 MED ORDER — DIPHENHYDRAMINE HCL 50 MG/ML IJ SOLN
50.0000 mg | Freq: Once | INTRAMUSCULAR | Status: AC
  Administered 2020-01-09: 50 mg via INTRAVENOUS
  Filled 2020-01-09: qty 1

## 2020-01-09 MED ORDER — FAMOTIDINE IN NACL 20-0.9 MG/50ML-% IV SOLN
20.0000 mg | Freq: Once | INTRAVENOUS | Status: AC
  Administered 2020-01-09: 20 mg via INTRAVENOUS
  Filled 2020-01-09: qty 50

## 2020-01-09 MED ORDER — HEPARIN SOD (PORK) LOCK FLUSH 100 UNIT/ML IV SOLN
500.0000 [IU] | Freq: Once | INTRAVENOUS | Status: AC | PRN
  Administered 2020-01-09: 500 [IU]
  Filled 2020-01-09: qty 5

## 2020-01-09 MED ORDER — PALONOSETRON HCL INJECTION 0.25 MG/5ML
0.2500 mg | Freq: Once | INTRAVENOUS | Status: AC
  Administered 2020-01-09: 0.25 mg via INTRAVENOUS
  Filled 2020-01-09: qty 5

## 2020-01-09 MED ORDER — SODIUM CHLORIDE 0.9 % IV SOLN
10.0000 mg | Freq: Once | INTRAVENOUS | Status: AC
  Administered 2020-01-09: 10 mg via INTRAVENOUS
  Filled 2020-01-09: qty 10

## 2020-01-09 MED ORDER — SODIUM CHLORIDE 0.9 % IV SOLN
291.2000 mg | Freq: Once | INTRAVENOUS | Status: AC
  Administered 2020-01-09: 290 mg via INTRAVENOUS
  Filled 2020-01-09: qty 29

## 2020-01-09 MED ORDER — HEPARIN SOD (PORK) LOCK FLUSH 100 UNIT/ML IV SOLN
INTRAVENOUS | Status: AC
  Filled 2020-01-09: qty 5

## 2020-01-09 MED ORDER — SODIUM CHLORIDE 0.9 % IV SOLN
45.0000 mg/m2 | Freq: Once | INTRAVENOUS | Status: AC
  Administered 2020-01-09: 84 mg via INTRAVENOUS
  Filled 2020-01-09: qty 14

## 2020-01-09 MED ORDER — SODIUM CHLORIDE 0.9 % IV SOLN
Freq: Once | INTRAVENOUS | Status: AC
  Filled 2020-01-09: qty 250

## 2020-01-09 NOTE — Progress Notes (Signed)
Patient stated that he had been doing well with no complaints. 

## 2020-01-10 ENCOUNTER — Ambulatory Visit
Admission: RE | Admit: 2020-01-10 | Discharge: 2020-01-10 | Disposition: A | Discharge status: Home / Self Care | Payer: 59 | Source: Ambulatory Visit | Attending: Radiation Oncology | Admitting: Radiation Oncology

## 2020-01-10 ENCOUNTER — Other Ambulatory Visit: Payer: Self-pay

## 2020-01-10 ENCOUNTER — Encounter: Payer: Self-pay | Admitting: Oncology

## 2020-01-10 DIAGNOSIS — C3412 Malignant neoplasm of upper lobe, left bronchus or lung: Secondary | ICD-10-CM | POA: Diagnosis not present

## 2020-01-10 NOTE — Progress Notes (Signed)
Hematology/Oncology Consult note [facility]  Telephone:([phone] [phone]  Patient Care Team: Patient, No Pcp Per as PCP - General ([place]) Telford Nab, RN as Registered Nurse   Name of the patient: Kyle Guerrero  [id number]  [birth year]   Date of visit: 01/10/20  Diagnosis- Non-small cell lung cancer stage IV acT2 cN2 cM1 a with pleural involvement  Chief complaint/ Reason for visit-on treatment assessment prior to cycle 6 of weekly carbotaxol chemotherapy  Heme/Onc history: patient is a 49 year old male who presented to the ER with symptoms ofheaviness in his chest and upper left chest discomfort he underwent CT angio chest to rule out PE which showed 3.8 x 3.3 cm left upper palpable lung mass along with 4.4 x 3.3 cm lobulated mass in the aortopulmonary window and 2.6 x 2.4 cm left hilar mass all concerning for malignancy. Patient has also seen pulmonary and has been set up for bronchoscopy and EBUS guided biopsy on 11/23/2019. Patient underwent. PET CT scan which showed a hypermetabolic spiculated 3.5 cm of 5 left upper lobe lung mass, adjacent hypermetabolic 3.2 x 1.2 cm pleural metastases in the medial posterior by the left pleural space along with scalloping of the adjacent posterior left third rib. Hypermetabolic infiltrative left perihilar conglomerate nodal metastases measuring up to 7.3 x 3.6 cm and 0.8 cm high left mediastinal node between the left brachiocephalic vein and left subclavian artery. No evidence of distant metastatic disease  Biopsy showed non-small cell lung cancer but further characterization could not be determined. Insufficient tissue for NGS testing. Repeat biopsy done. Results of NGS pending. 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   Interval history-reports feeling better and shortness of breath is improving.  He does have some mild fatigue but denies other complaints at this time  ECOG  PS- 1 Pain scale- 0   Review of systems- Review of Systems  Constitutional: Positive for malaise/fatigue. Negative for chills, fever and weight loss.  HENT: Negative for congestion, ear discharge and nosebleeds.   Eyes: Negative for blurred vision.  Respiratory: Negative for cough, hemoptysis, sputum production, shortness of breath and wheezing.   Cardiovascular: Negative for chest pain, palpitations, orthopnea and claudication.  Gastrointestinal: Negative for abdominal pain, blood in stool, constipation, diarrhea, heartburn, melena, nausea and vomiting.  Genitourinary: Negative for dysuria, flank pain, frequency, hematuria and urgency.  Musculoskeletal: Negative for back pain, joint pain and myalgias.  Skin: Negative for rash.  Neurological: Negative for dizziness, tingling, focal weakness, seizures, weakness and headaches.  Endo/Heme/Allergies: Does not bruise/bleed easily.  Psychiatric/Behavioral: Negative for depression and suicidal ideas. The patient does not have insomnia.        No Known Allergies   History reviewed. No pertinent past medical history.   Past Surgical History:  Procedure Laterality Date  . CYST EXCISION    . IR IMAGING GUIDED PORT INSERTION  12/05/2019  . VIDEO BRONCHOSCOPY WITH ENDOBRONCHIAL ULTRASOUND Left 11/22/2019   Procedure: VIDEO BRONCHOSCOPY WITH ENDOBRONCHIAL ULTRASOUND;  Surgeon: Tyler Pita, MD;  Location: [facility] ORS;  Service: Thoracic;  Laterality: Left;    Social History   Socioeconomic History  . Marital status: Single    Spouse name: Not on file  . Number of children: Not on file  . Years of education: Not on file  . Highest education level: Not on file  Occupational History  . Not on file  Tobacco Use  . Smoking status: Former Smoker    Packs/day: 2.00  Types: Cigarettes    Quit date: 11/08/2019    Years since quitting: 0.1  . Smokeless tobacco: Never Used  Substance and Sexual Activity  . Alcohol use: Not Currently   . Drug use: Yes    Types: Marijuana  . Sexual activity: Not on file  Other Topics Concern  . Not on file  Social History Narrative  . Not on file   Social Determinants of Health   Financial Resource Strain:   . Difficulty of Paying Living Expenses: Not on file  Food Insecurity:   . Worried About Charity fundraiser in the Last Year: Not on file  . Ran Out of Food in the Last Year: Not on file  Transportation Needs:   . Lack of Transportation (Medical): Not on file  . Lack of Transportation (Non-Medical): Not on file  Physical Activity:   . Days of Exercise per Week: Not on file  . Minutes of Exercise per Session: Not on file  Stress:   . Feeling of Stress : Not on file  Social Connections:   . Frequency of Communication with Friends and Family: Not on file  . Frequency of Social Gatherings with Friends and Family: Not on file  . Attends Religious Services: Not on file  . Active Member of Clubs or Organizations: Not on file  . Attends Archivist Meetings: Not on file  . Marital Status: Not on file  Intimate Partner Violence:   . Fear of Current or Ex-Partner: Not on file  . Emotionally Abused: Not on file  . Physically Abused: Not on file  . Sexually Abused: Not on file    Family History  Problem Relation Age of Onset  . Healthy Mother   . Healthy Father      Current Outpatient Medications:  .  dexamethasone (DECADRON) 4 MG tablet, Take 2 tablets (8 mg total) by mouth daily. Start the day after chemotherapy for 2 days., Disp: 30 tablet, Rfl: 1 .  lidocaine-prilocaine (EMLA) cream, Apply to affected area once, Disp: 30 g, Rfl: 3 .  NEOMYCIN-POLYMYXIN-HYDROCORTISONE (CORTISPORIN) 1 % SOLN OTIC solution, 3 drops 4 (four) times daily., Disp: , Rfl:  .  sucralfate (CARAFATE) 1 g tablet, Take 1 tablet (1 g total) by mouth 3 (three) times daily before meals. Dissolve in warm water, swish and swallow, Disp: 90 tablet, Rfl: 6 .  azelastine (ASTELIN) 0.1 % nasal  spray, Place 1 spray into the nose 2 (two) times daily., Disp: , Rfl:  .  ondansetron (ZOFRAN) 8 MG tablet, Take 1 tablet (8 mg total) by mouth 2 (two) times daily as needed for refractory nausea / vomiting. Start on day 3 after chemo. (Patient not taking: Reported on 01/09/2020), Disp: 30 tablet, Rfl: 1 .  prochlorperazine (COMPAZINE) 10 MG tablet, Take 1 tablet (10 mg total) by mouth every 6 (six) hours as needed (Nausea or vomiting). (Patient not taking: Reported on 01/09/2020), Disp: 30 tablet, Rfl: 1  Physical exam:  Vitals:   01/09/20 0904 01/09/20 0910  BP:  133/90  Pulse: 82 76  Temp: (!) 96.2 F (35.7 C) (!) 96.2 F (35.7 C)  TempSrc: Tympanic Tympanic  SpO2: 100% 100%  Weight: 167 lb (75.8 kg)   Height: 5' 11"  (1.803 m)    Physical Exam Constitutional:      General: He is not in acute distress. HENT:     Head: Normocephalic and atraumatic.  Eyes:     Pupils: Pupils are equal, round, and reactive to  light.  Cardiovascular:     Rate and Rhythm: Normal rate and regular rhythm.     Heart sounds: Normal heart sounds.  Pulmonary:     Effort: Pulmonary effort is normal.     Breath sounds: Normal breath sounds.  Abdominal:     General: Bowel sounds are normal.     Palpations: Abdomen is soft.  Musculoskeletal:     Cervical back: Normal range of motion.     Comments: Clubbing positive  Skin:    General: Skin is warm and dry.  Neurological:     Mental Status: He is alert and oriented to person, place, and time.      CMP Latest Ref Rng & Units 01/09/2020  Glucose 70 - 99 mg/dL 96  BUN 6 - 20 mg/dL 18  Creatinine 0.61 - 1.24 mg/dL 0.66  Sodium 135 - 145 mmol/L 136  Potassium 3.5 - 5.1 mmol/L 3.9  Chloride 98 - 111 mmol/L 101  CO2 22 - 32 mmol/L 25  Calcium 8.9 - 10.3 mg/dL 9.1  Total Protein 6.5 - 8.1 g/dL 7.6  Total Bilirubin 0.3 - 1.2 mg/dL 0.4  Alkaline Phos 38 - 126 U/L 139(H)  AST 15 - 41 U/L 30  ALT 0 - 44 U/L 63(H)   CBC Latest Ref Rng & Units 01/09/2020   WBC 4.0 - 10.5 K/uL 3.5(L)  Hemoglobin 13.0 - 17.0 g/dL 11.5(L)  Hematocrit 39.0 - 52.0 % 35.2(L)  Platelets 150 - 400 K/uL 255     Assessment and plan- Patient is a 49 y.o. male  stage IVa non-small cell lung cancer cT2 cN2 cM1a.  He is here for on treatment assessment prior to cycle 6 of weekly carbotaxol chemotherapy  Overall tolerating chemotherapy well and will proceed for cycle 6 of weekly carbotaxol chemotherapy today.  He will directly proceed for cycle 7 next week and I will see him back in 2 weeks for cycle 8 which would be his last concurrent chemoradiation.  Following that I will repeat his CT chest abdomen and pelvis with contrast.  His NGS study should be back by then and I will switch him to 2 more cycles of carbo Taxol and Keytruda at that time.  Chemo-induced leukopenia: Neutrophils counts are still adequate but he may need Neupogen support with next cycle  Abnormal LFTs: AST normal and ALT mildly elevated at 63.  Continue to monitor   Visit Diagnosis 1. Encounter for antineoplastic chemotherapy   2. Malignant neoplasm of upper lobe of left lung ([place])   3. Abnormal LFTs      Dr. Randa Evens, MD, MPH [facility] [phone] 01/10/2020 3:07 PM

## 2020-01-13 ENCOUNTER — Ambulatory Visit
Admission: RE | Admit: 2020-01-13 | Discharge: 2020-01-13 | Disposition: A | Discharge status: Home / Self Care | Payer: 59 | Source: Ambulatory Visit | Attending: Radiation Oncology | Admitting: Radiation Oncology

## 2020-01-13 ENCOUNTER — Other Ambulatory Visit: Payer: Self-pay

## 2020-01-13 DIAGNOSIS — C3412 Malignant neoplasm of upper lobe, left bronchus or lung: Secondary | ICD-10-CM | POA: Diagnosis not present

## 2020-01-14 ENCOUNTER — Ambulatory Visit
Admission: RE | Admit: 2020-01-14 | Discharge: 2020-01-14 | Disposition: A | Discharge status: Home / Self Care | Payer: 59 | Source: Ambulatory Visit | Attending: Radiation Oncology | Admitting: Radiation Oncology

## 2020-01-14 ENCOUNTER — Other Ambulatory Visit: Payer: Self-pay

## 2020-01-14 DIAGNOSIS — C3412 Malignant neoplasm of upper lobe, left bronchus or lung: Secondary | ICD-10-CM | POA: Diagnosis not present

## 2020-01-15 ENCOUNTER — Other Ambulatory Visit: Payer: Self-pay

## 2020-01-15 ENCOUNTER — Ambulatory Visit
Admission: RE | Admit: 2020-01-15 | Discharge: 2020-01-15 | Disposition: A | Discharge status: Home / Self Care | Payer: 59 | Source: Ambulatory Visit | Attending: Radiation Oncology | Admitting: Radiation Oncology

## 2020-01-15 DIAGNOSIS — C3412 Malignant neoplasm of upper lobe, left bronchus or lung: Secondary | ICD-10-CM | POA: Diagnosis not present

## 2020-01-16 ENCOUNTER — Ambulatory Visit
Admission: RE | Admit: 2020-01-16 | Discharge: 2020-01-16 | Disposition: A | Discharge status: Home / Self Care | Payer: 59 | Source: Ambulatory Visit | Attending: Radiation Oncology | Admitting: Radiation Oncology

## 2020-01-16 ENCOUNTER — Inpatient Hospital Stay: Payer: 59

## 2020-01-16 ENCOUNTER — Other Ambulatory Visit: Payer: Self-pay | Admitting: *Deleted

## 2020-01-16 ENCOUNTER — Encounter: Payer: Self-pay | Admitting: *Deleted

## 2020-01-16 ENCOUNTER — Other Ambulatory Visit: Payer: Self-pay

## 2020-01-16 VITALS — BP 138/86 | HR 83 | Temp 98.3°F | Resp 18 | Wt 171.0 lb

## 2020-01-16 DIAGNOSIS — C3412 Malignant neoplasm of upper lobe, left bronchus or lung: Secondary | ICD-10-CM | POA: Diagnosis not present

## 2020-01-16 DIAGNOSIS — Z95828 Presence of other vascular implants and grafts: Secondary | ICD-10-CM

## 2020-01-16 DIAGNOSIS — C3490 Malignant neoplasm of unspecified part of unspecified bronchus or lung: Secondary | ICD-10-CM

## 2020-01-16 LAB — COMPREHENSIVE METABOLIC PANEL
ALT: 188 U/L — ABNORMAL HIGH (ref 0–44)
AST: 70 U/L — ABNORMAL HIGH (ref 15–41)
Albumin: 3.6 g/dL (ref 3.5–5.0)
Alkaline Phosphatase: 161 U/L — ABNORMAL HIGH (ref 38–126)
Anion gap: 7 (ref 5–15)
BUN: 13 mg/dL (ref 6–20)
CO2: 25 mmol/L (ref 22–32)
Calcium: 9.1 mg/dL (ref 8.9–10.3)
Chloride: 102 mmol/L (ref 98–111)
Creatinine, Ser: 0.73 mg/dL (ref 0.61–1.24)
GFR calc Af Amer: >60 mL/min (ref >60)
GFR calc non Af Amer: >60 mL/min (ref >60)
Glucose, Bld: 85 mg/dL (ref 70–99)
Potassium: 3.9 mmol/L (ref 3.5–5.1)
Sodium: 134 mmol/L — ABNORMAL LOW (ref 135–145)
Total Bilirubin: 0.5 mg/dL (ref 0.3–1.2)
Total Protein: 7.3 g/dL (ref 6.5–8.1)

## 2020-01-16 LAB — CBC WITH DIFFERENTIAL/PLATELET
Abs Immature Granulocytes: 0.03 10*3/uL (ref 0.00–0.07)
Basophils Absolute: 0 10*3/uL (ref 0.0–0.1)
Basophils Relative: 0 %
Eosinophils Absolute: 0 10*3/uL (ref 0.0–0.5)
Eosinophils Relative: 1 %
HCT: 36 % — ABNORMAL LOW (ref 39.0–52.0)
Hemoglobin: 11.3 g/dL — ABNORMAL LOW (ref 13.0–17.0)
Immature Granulocytes: 1 %
Lymphocytes Relative: 23 %
Lymphs Abs: 0.6 10*3/uL — ABNORMAL LOW (ref 0.7–4.0)
MCH: 28 pg (ref 26.0–34.0)
MCHC: 31.4 g/dL (ref 30.0–36.0)
MCV: 89.1 fL (ref 80.0–100.0)
Monocytes Absolute: 0.3 10*3/uL (ref 0.1–1.0)
Monocytes Relative: 12 %
Neutro Abs: 1.6 10*3/uL — ABNORMAL LOW (ref 1.7–7.7)
Neutrophils Relative %: 63 %
Platelets: 246 10*3/uL (ref 150–400)
RBC: 4.04 MIL/uL — ABNORMAL LOW (ref 4.22–5.81)
RDW: 16.3 % — ABNORMAL HIGH (ref 11.5–15.5)
WBC: 2.5 10*3/uL — ABNORMAL LOW (ref 4.0–10.5)
nRBC: 0 % (ref 0.0–0.2)

## 2020-01-16 MED ORDER — SODIUM CHLORIDE 0.9 % IV SOLN
45.0000 mg/m2 | Freq: Once | INTRAVENOUS | Status: AC
  Administered 2020-01-16: 84 mg via INTRAVENOUS
  Filled 2020-01-16: qty 14

## 2020-01-16 MED ORDER — SODIUM CHLORIDE 0.9 % IV SOLN
291.2000 mg | Freq: Once | INTRAVENOUS | Status: AC
  Administered 2020-01-16: 290 mg via INTRAVENOUS
  Filled 2020-01-16: qty 29

## 2020-01-16 MED ORDER — PALONOSETRON HCL INJECTION 0.25 MG/5ML
0.2500 mg | Freq: Once | INTRAVENOUS | Status: AC
  Administered 2020-01-16: 0.25 mg via INTRAVENOUS
  Filled 2020-01-16: qty 5

## 2020-01-16 MED ORDER — HEPARIN SOD (PORK) LOCK FLUSH 100 UNIT/ML IV SOLN
250.0000 [IU] | Freq: Once | INTRAVENOUS | Status: AC | PRN
  Administered 2020-01-16: 500 [IU]
  Filled 2020-01-16: qty 5

## 2020-01-16 MED ORDER — SODIUM CHLORIDE 0.9 % IV SOLN
Freq: Once | INTRAVENOUS | Status: AC
  Filled 2020-01-16: qty 250

## 2020-01-16 MED ORDER — DIPHENHYDRAMINE HCL 50 MG/ML IJ SOLN
50.0000 mg | Freq: Once | INTRAMUSCULAR | Status: AC
  Administered 2020-01-16: 50 mg via INTRAVENOUS
  Filled 2020-01-16: qty 1

## 2020-01-16 MED ORDER — SODIUM CHLORIDE 0.9 % IV SOLN
10.0000 mg | Freq: Once | INTRAVENOUS | Status: AC
  Administered 2020-01-16: 10 mg via INTRAVENOUS
  Filled 2020-01-16: qty 10

## 2020-01-16 MED ORDER — SODIUM CHLORIDE 0.9% FLUSH
10.0000 mL | Freq: Once | INTRAVENOUS | Status: AC
  Administered 2020-01-16: 10 mL via INTRAVENOUS
  Filled 2020-01-16: qty 10

## 2020-01-16 MED ORDER — FAMOTIDINE IN NACL 20-0.9 MG/50ML-% IV SOLN
20.0000 mg | Freq: Once | INTRAVENOUS | Status: AC
  Administered 2020-01-16: 20 mg via INTRAVENOUS
  Filled 2020-01-16: qty 50

## 2020-01-16 NOTE — Progress Notes (Signed)
LFT abnormal, MD has reviewed all labs and is ok to proceed with treatment today

## 2020-01-16 NOTE — Progress Notes (Signed)
1125: AST 70, ALT 188, Dr. Janese Banks aware, per Dr. Janese Banks labs have been reviewed and okay to proceed with Taxol/Carboplatin treatment at this time.

## 2020-01-16 NOTE — Progress Notes (Signed)
  Oncology Nurse Navigator Documentation  Navigator Location: [facility] (01/16/20 1100)   )Navigator Encounter Type: Treatment (01/16/20 1100)                       Treatment Phase: Active Tx (01/16/20 1100) Barriers/Navigation Needs: Coordination of Care (01/16/20 1100)   Interventions: Coordination of Care (01/16/20 1100)   Coordination of Care: Appts;Chemo;Radiology (01/16/20 1100)         spoke with patient during treatment to review upcoming appts and change in treatment plan per Dr. Janese Banks. Informed pt that due to neutropenia that next 2 cycles of chemo will be held. Informed that Dr. Janese Banks wants pt to have CT scan on 2/8 to follow up treatment response and will follow up on 2/11 to start chemo+immunotherapy. Reviewed all appts with patient. All questions answered during visit. Instructed pt to call with any further questions or needs. Pt verbalized understanding.          Time Spent with Patient: 45 (01/16/20 1100)

## 2020-01-17 ENCOUNTER — Other Ambulatory Visit: Payer: Self-pay

## 2020-01-17 ENCOUNTER — Ambulatory Visit
Admission: RE | Admit: 2020-01-17 | Discharge: 2020-01-17 | Disposition: A | Discharge status: Home / Self Care | Payer: 59 | Source: Ambulatory Visit | Attending: Radiation Oncology | Admitting: Radiation Oncology

## 2020-01-17 DIAGNOSIS — C3412 Malignant neoplasm of upper lobe, left bronchus or lung: Secondary | ICD-10-CM | POA: Diagnosis not present

## 2020-01-20 ENCOUNTER — Other Ambulatory Visit: Payer: Self-pay

## 2020-01-20 ENCOUNTER — Ambulatory Visit
Admission: RE | Admit: 2020-01-20 | Discharge: 2020-01-20 | Disposition: A | Discharge status: Home / Self Care | Payer: 59 | Source: Ambulatory Visit | Attending: Radiation Oncology | Admitting: Radiation Oncology

## 2020-01-20 DIAGNOSIS — C3412 Malignant neoplasm of upper lobe, left bronchus or lung: Secondary | ICD-10-CM | POA: Diagnosis not present

## 2020-01-21 ENCOUNTER — Other Ambulatory Visit: Payer: Self-pay

## 2020-01-21 ENCOUNTER — Ambulatory Visit
Admission: RE | Admit: 2020-01-21 | Discharge: 2020-01-21 | Disposition: A | Discharge status: Home / Self Care | Payer: 59 | Source: Ambulatory Visit | Attending: Radiation Oncology | Admitting: Radiation Oncology

## 2020-01-21 DIAGNOSIS — C3412 Malignant neoplasm of upper lobe, left bronchus or lung: Secondary | ICD-10-CM | POA: Diagnosis not present

## 2020-01-22 ENCOUNTER — Other Ambulatory Visit: Payer: Self-pay

## 2020-01-22 ENCOUNTER — Ambulatory Visit
Admission: RE | Admit: 2020-01-22 | Discharge: 2020-01-22 | Disposition: A | Discharge status: Home / Self Care | Payer: 59 | Source: Ambulatory Visit | Attending: Radiation Oncology | Admitting: Radiation Oncology

## 2020-01-22 DIAGNOSIS — C3412 Malignant neoplasm of upper lobe, left bronchus or lung: Secondary | ICD-10-CM | POA: Diagnosis not present

## 2020-01-23 ENCOUNTER — Other Ambulatory Visit: Payer: Self-pay

## 2020-01-23 ENCOUNTER — Other Ambulatory Visit: Payer: 59

## 2020-01-23 ENCOUNTER — Ambulatory Visit: Payer: 59 | Admitting: Oncology

## 2020-01-23 ENCOUNTER — Ambulatory Visit: Payer: 59

## 2020-01-23 ENCOUNTER — Ambulatory Visit
Admission: RE | Admit: 2020-01-23 | Discharge: 2020-01-23 | Disposition: A | Discharge status: Home / Self Care | Payer: 59 | Source: Ambulatory Visit | Attending: Radiation Oncology | Admitting: Radiation Oncology

## 2020-01-23 DIAGNOSIS — C3412 Malignant neoplasm of upper lobe, left bronchus or lung: Secondary | ICD-10-CM | POA: Diagnosis not present

## 2020-01-24 ENCOUNTER — Other Ambulatory Visit: Payer: Self-pay

## 2020-01-24 ENCOUNTER — Encounter: Payer: Self-pay | Admitting: *Deleted

## 2020-01-24 ENCOUNTER — Ambulatory Visit
Admission: RE | Admit: 2020-01-24 | Discharge: 2020-01-24 | Disposition: A | Discharge status: Home / Self Care | Payer: 59 | Source: Ambulatory Visit | Attending: Radiation Oncology | Admitting: Radiation Oncology

## 2020-01-24 DIAGNOSIS — C3412 Malignant neoplasm of upper lobe, left bronchus or lung: Secondary | ICD-10-CM | POA: Diagnosis not present

## 2020-01-27 ENCOUNTER — Ambulatory Visit
Admission: RE | Admit: 2020-01-27 | Discharge: 2020-01-27 | Disposition: A | Discharge status: Home / Self Care | Payer: 59 | Source: Ambulatory Visit | Attending: Radiation Oncology | Admitting: Radiation Oncology

## 2020-01-27 ENCOUNTER — Other Ambulatory Visit: Payer: Self-pay

## 2020-01-27 DIAGNOSIS — C3412 Malignant neoplasm of upper lobe, left bronchus or lung: Secondary | ICD-10-CM | POA: Diagnosis present

## 2020-01-27 DIAGNOSIS — Z510 Encounter for antineoplastic radiation therapy: Secondary | ICD-10-CM | POA: Insufficient documentation

## 2020-01-28 ENCOUNTER — Other Ambulatory Visit: Payer: Self-pay

## 2020-01-28 ENCOUNTER — Ambulatory Visit
Admission: RE | Admit: 2020-01-28 | Discharge: 2020-01-28 | Disposition: A | Discharge status: Home / Self Care | Payer: 59 | Source: Ambulatory Visit | Attending: Radiation Oncology | Admitting: Radiation Oncology

## 2020-01-28 DIAGNOSIS — C3412 Malignant neoplasm of upper lobe, left bronchus or lung: Secondary | ICD-10-CM | POA: Diagnosis not present

## 2020-01-29 ENCOUNTER — Ambulatory Visit
Admission: RE | Admit: 2020-01-29 | Discharge: 2020-01-29 | Disposition: A | Discharge status: Home / Self Care | Payer: 59 | Source: Ambulatory Visit | Attending: Radiation Oncology | Admitting: Radiation Oncology

## 2020-01-29 ENCOUNTER — Other Ambulatory Visit: Payer: Self-pay

## 2020-01-29 DIAGNOSIS — C3412 Malignant neoplasm of upper lobe, left bronchus or lung: Secondary | ICD-10-CM | POA: Diagnosis not present

## 2020-01-30 ENCOUNTER — Other Ambulatory Visit: Payer: 59

## 2020-01-30 ENCOUNTER — Ambulatory Visit: Payer: 59

## 2020-01-30 ENCOUNTER — Other Ambulatory Visit: Payer: Self-pay

## 2020-01-30 ENCOUNTER — Ambulatory Visit
Admission: RE | Admit: 2020-01-30 | Discharge: 2020-01-30 | Disposition: A | Discharge status: Home / Self Care | Payer: 59 | Source: Ambulatory Visit | Attending: Radiation Oncology | Admitting: Radiation Oncology

## 2020-01-30 DIAGNOSIS — C3412 Malignant neoplasm of upper lobe, left bronchus or lung: Secondary | ICD-10-CM | POA: Diagnosis not present

## 2020-02-03 ENCOUNTER — Other Ambulatory Visit: Payer: Self-pay

## 2020-02-03 ENCOUNTER — Ambulatory Visit
Admission: RE | Admit: 2020-02-03 | Discharge: 2020-02-03 | Disposition: A | Discharge status: Home / Self Care | Payer: 59 | Source: Ambulatory Visit | Attending: Oncology | Admitting: Oncology

## 2020-02-03 DIAGNOSIS — C3490 Malignant neoplasm of unspecified part of unspecified bronchus or lung: Secondary | ICD-10-CM | POA: Insufficient documentation

## 2020-02-03 HISTORY — DX: Unspecified asthma, uncomplicated: J45.909

## 2020-02-03 IMAGING — CT CT CHEST W/ CM
2 of 5 series · 12 of 36 positions shown, 15 images · IV contrast (omnipaque)
Comparison: PET-CT [DATE]. Chest CTA [DATE].

CLINICAL DATA: Lung cancer. Restaging.

EXAM:
CT CHEST, ABDOMEN, AND PELVIS WITH CONTRAST
TECHNIQUE: Multidetector CT imaging of the chest, abdomen and pelvis was
performed following the standard protocol during bolus
administration of intravenous contrast.
CONTRAST:  100mL OMNIPAQUE IOHEXOL 300 MG/ML  SOLN

[Series 3: lungs cap 2.00 · axial · 0.73mm/px · z∈[-1550,-968]mm · 9 of 329 slices shown, 12 images]
[im 19/329  mediastinal]
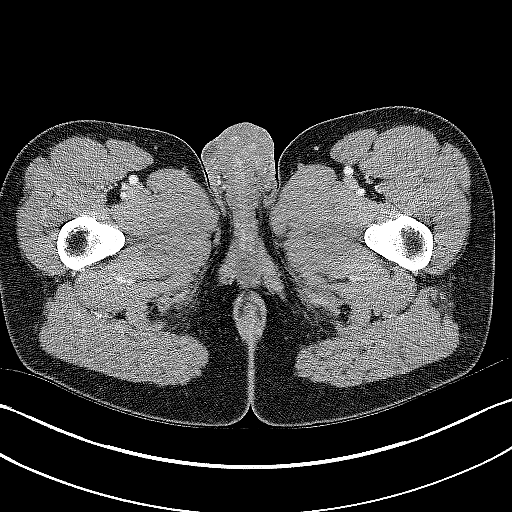
[im 19/329  lung]
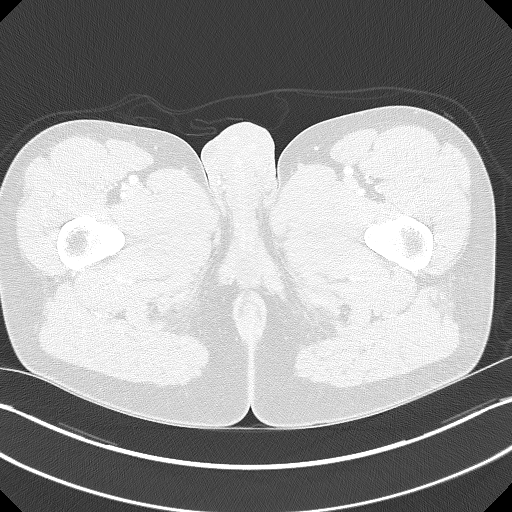
[im 55/329  lung]
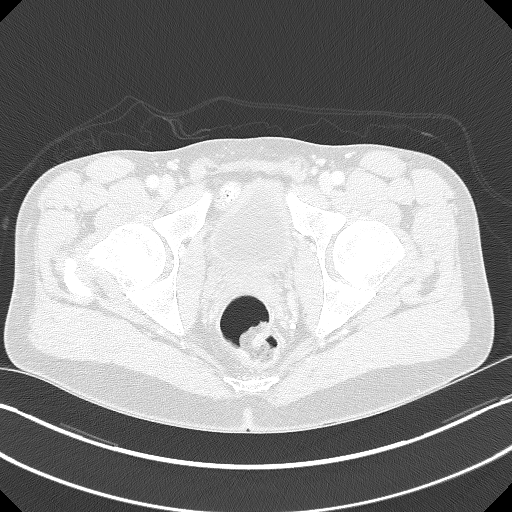
[im 92/329  lung]
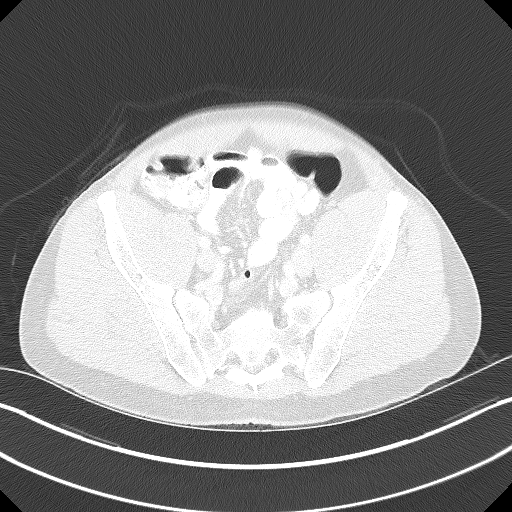
[im 128/329  lung]
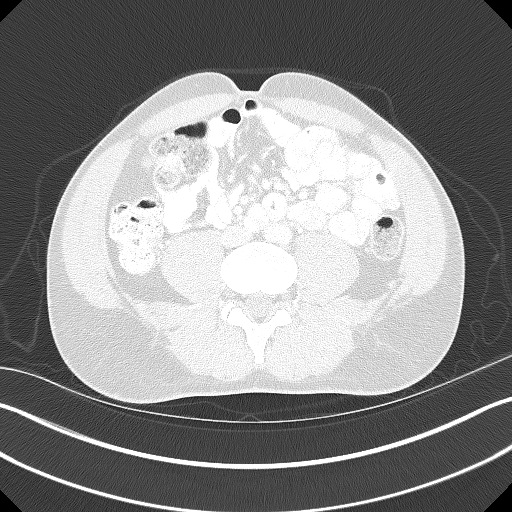
[im 165/329  mediastinal]
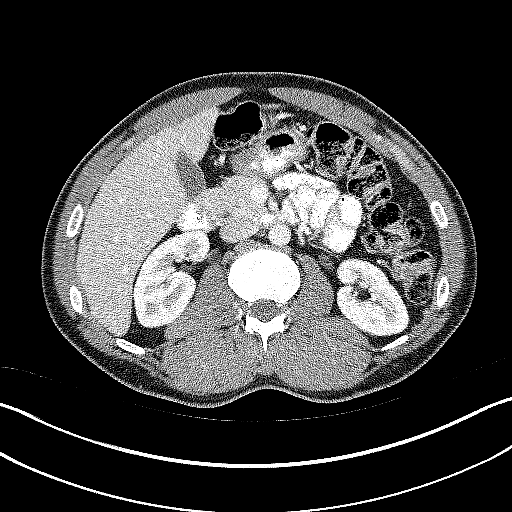
[im 165/329  lung]
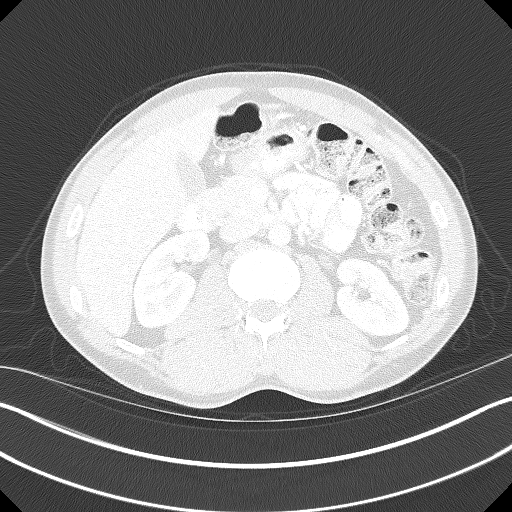
[im 201/329  lung]
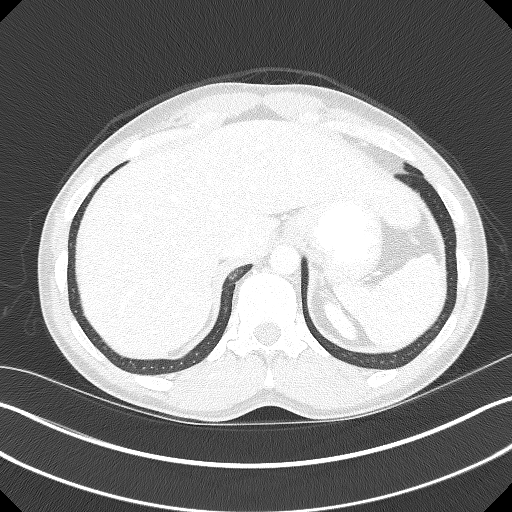
[im 237/329  lung]
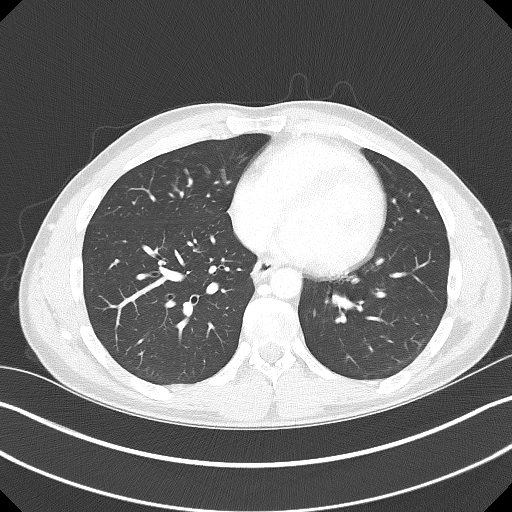
[im 274/329  lung]
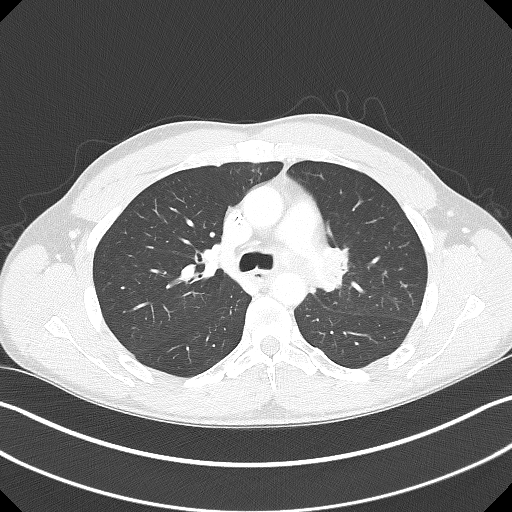
[im 310/329  mediastinal]
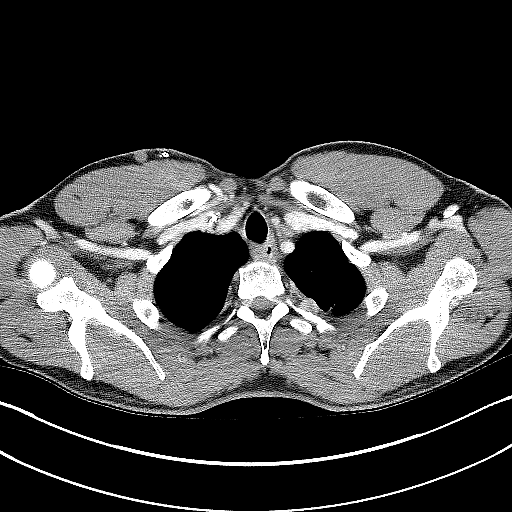
[im 310/329  lung]
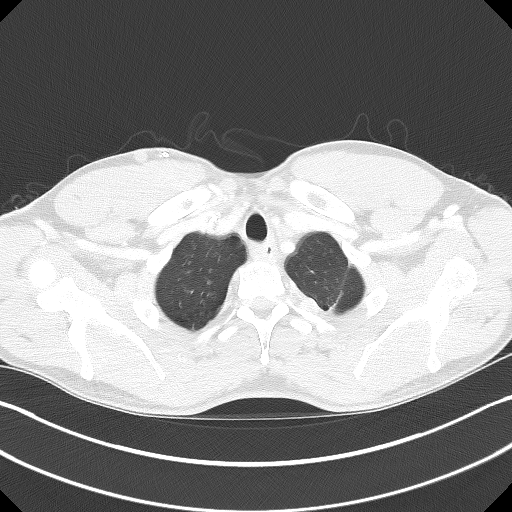

[Series 4: coronals cap 2.00 cor · coronal · 0.73mm/px · 3 of 132 slices shown]
[im 27/132  lung]
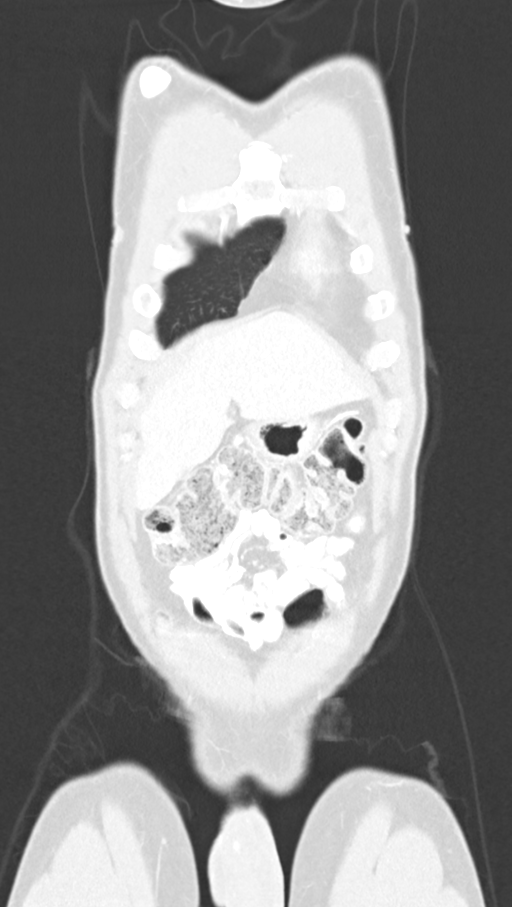
[im 53/132  lung]
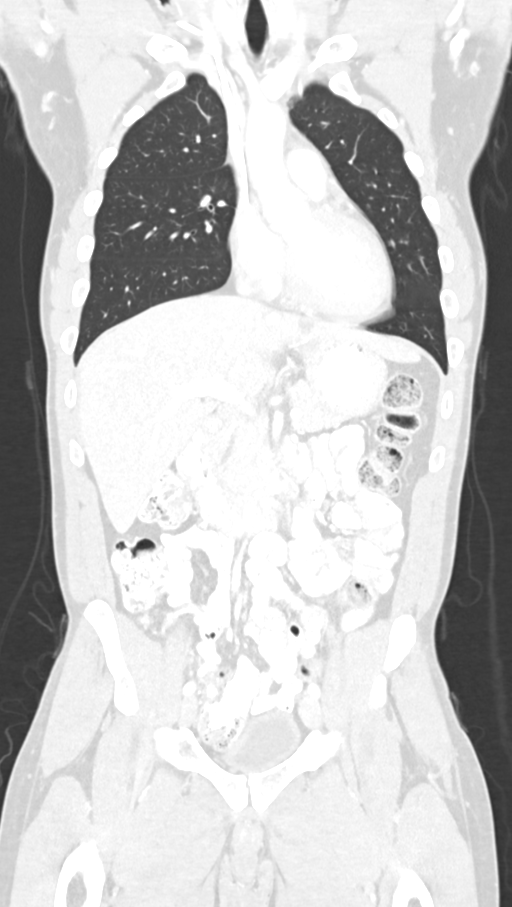
[im 79/132  lung]
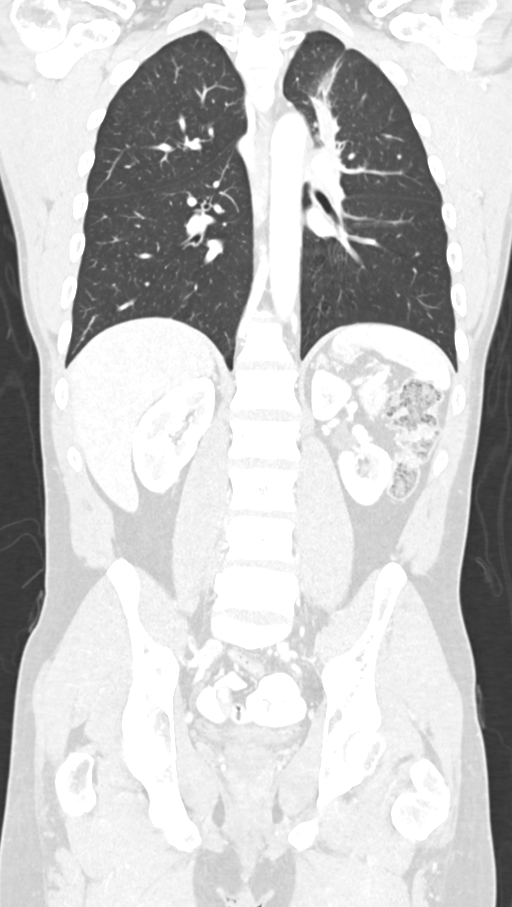

[12 of 36 positions shown; findings below may reference images not displayed]

FINDINGS: CT CHEST FINDINGS

Cardiovascular: The heart size is normal. No substantial pericardial
effusion. Right Port-A-Cath tip is at the SVC/RA junction.

Mediastinum/Nodes: AP window mass measures 4.0 x 3.1 cm today
compared to 4.4 x 3.3 cm on [DATE]. This lesion was not well
demonstrated on PET-CT performed without intravenous contrast.

Left hilar lesion measured on previous CT at [DATE] x 2.5 cm now
measures 1.2 x 0.9 cm.

No right hilar lymphadenopathy. The esophagus has normal imaging
features. There is no axillary lymphadenopathy.

Lungs/Pleura: The left apical lung mass measured previously at 3.8 x
3.3 cm now measures 2.3 x 1.4 cm.

Pleural metastatic lesion with scalloping of the adjacent posterior
left third rib has also decreased.

8 mm medial right middle lobe (confirmed on sagittal imaging) nodule
is identified on 54/3, new in the interval.

mm right lower lobe nodule on 70/3 is stable.

No pleural effusion. No pulmonary edema. Centrilobular emphsyema
noted.

Musculoskeletal: No worrisome lytic or sclerotic osseous
abnormality.

CT ABDOMEN PELVIS FINDINGS

Hepatobiliary: 11 mm low-density lesions identified in the dome of
the left liver on 48/2. This was not definitely seen on previous
PET-CT or CTA chest. No other focal abnormality within the liver
parenchyma. There is no evidence for gallstones, gallbladder wall
thickening, or pericholecystic fluid. No intrahepatic or
extrahepatic biliary dilation.

Pancreas: No focal mass lesion. No dilatation of the main duct. No
intraparenchymal cyst. No peripancreatic edema.

Spleen: No splenomegaly. No focal mass lesion.

Adrenals/Urinary Tract: No adrenal nodule or mass. Kidneys
unremarkable. No evidence for hydroureter. The urinary bladder
appears normal for the degree of distention.

Stomach/Bowel: Stomach is unremarkable. No gastric wall thickening.
No evidence of outlet obstruction. Duodenum is normally positioned
as is the ligament of Treitz. No small bowel wall thickening. No
small bowel dilatation. The terminal ileum is normal. Normal
appendix visualized on coronal 72/4. No gross colonic mass. No
colonic wall thickening.

Vascular/Lymphatic: No abdominal aortic aneurysm. No abdominal
lymphadenopathy No pelvic sidewall lymphadenopathy.

Reproductive: The prostate gland and seminal vesicles are
unremarkable.

Other: No intraperitoneal free fluid.

Musculoskeletal: No worrisome lytic or sclerotic osseous
abnormality.
IMPRESSION: 1. Interval decrease in size of the left apical lung mass. The
pleural lesion, left hilar lymphadenopathy, and AP window metastatic
disease has also decreased since the prior study.
2. New 8 mm medial right middle lobe pulmonary nodule. This may be
infectious/inflammatory but close follow-up recommended to exclude
metastatic disease. Rapid interval appearance makes metachronous
primary less likely.
3. 11 mm low-density lesion in the dome of the left liver, not
definitely seen on previous PET-CT or CTA chest. Finding is
indeterminate on today's study. Attention on follow-up recommended
to ensure stability. If additional characterization is warranted at
this time, MRI with and without contrast may prove helpful.

## 2020-02-03 IMAGING — CT CT ABD-PELV W/ CM
1 of 5 series · 13 of 46 positions shown, 17 images · IV contrast (omnipaque)
Comparison: PET-CT [DATE]. Chest CTA [DATE].

CLINICAL DATA: Lung cancer. Restaging.

EXAM:
CT CHEST, ABDOMEN, AND PELVIS WITH CONTRAST
TECHNIQUE: Multidetector CT imaging of the chest, abdomen and pelvis was
performed following the standard protocol during bolus
administration of intravenous contrast.
CONTRAST:  100mL OMNIPAQUE IOHEXOL 300 MG/ML  SOLN

[Series 3: lungs cap 2.00 · axial · 0.73mm/px · z∈[-1554,-964]mm · 13 of 329 slices shown, 17 images]
[im 17/329  soft-tissue]
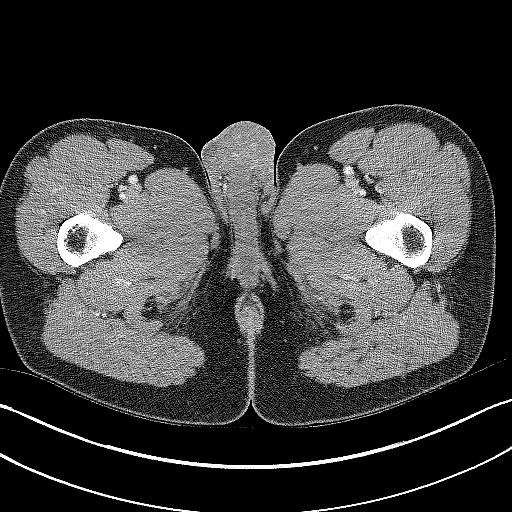
[im 17/329  lung]
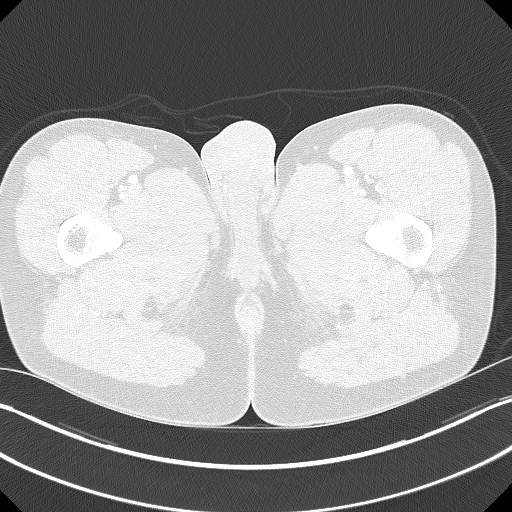
[im 50/329  lung]
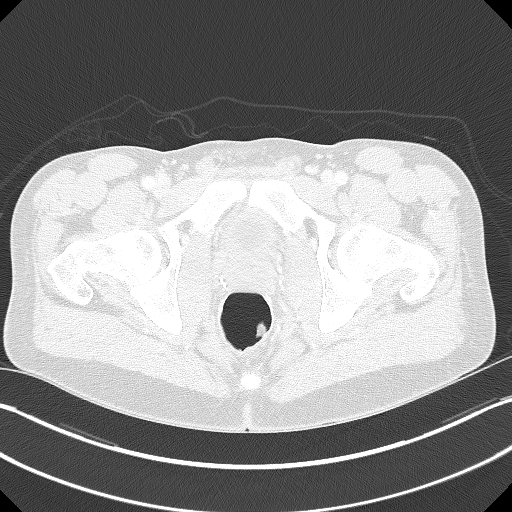
[im 66/329  lung]
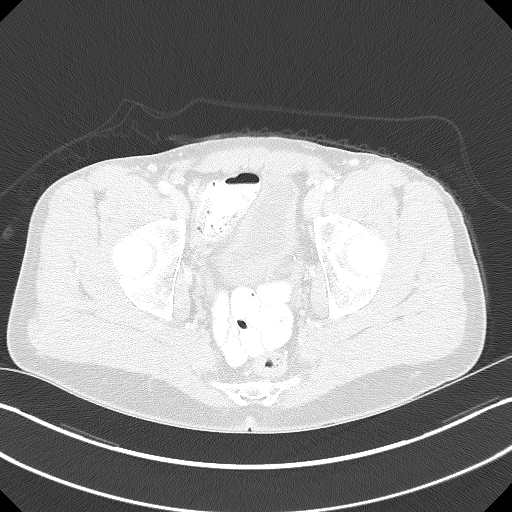
[im 99/329  lung]
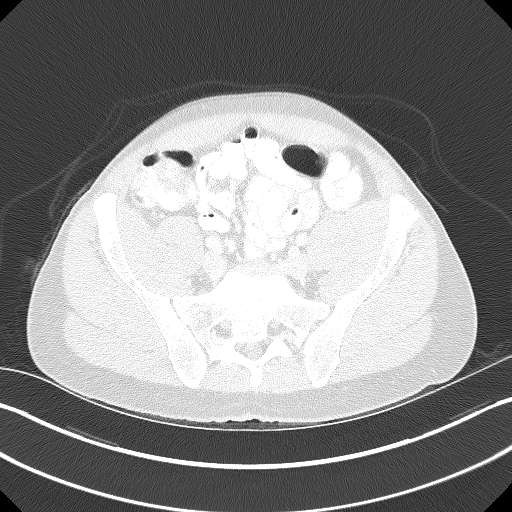
[im 115/329  soft-tissue]
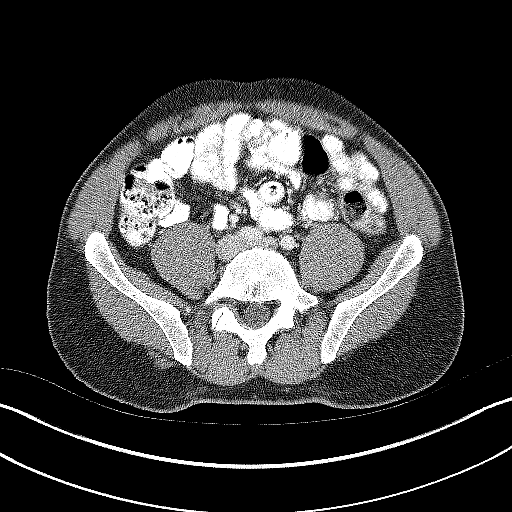
[im 115/329  lung]
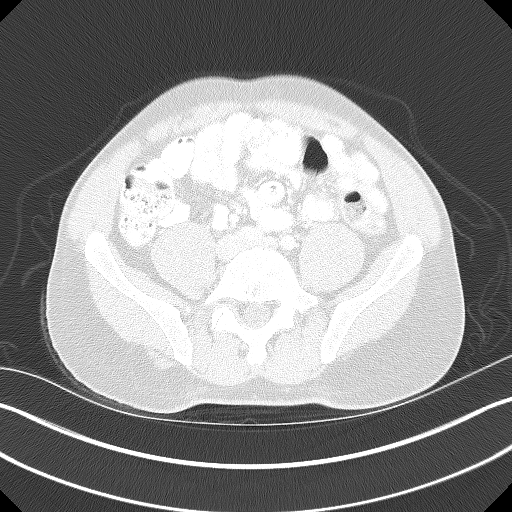
[im 148/329  lung]
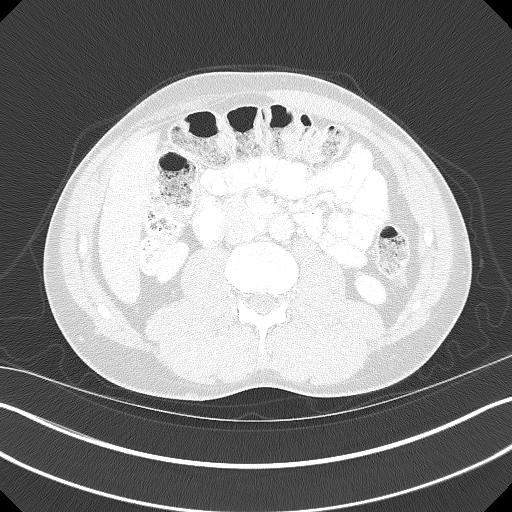
[im 165/329  lung]
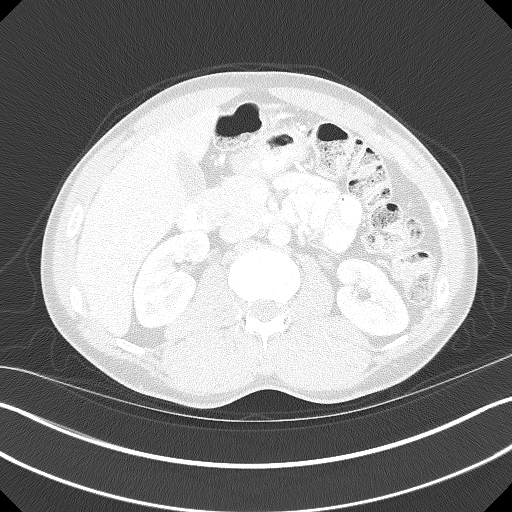
[im 181/329  lung]
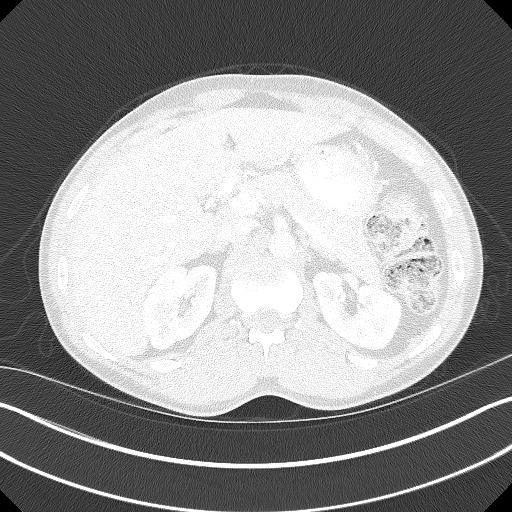
[im 214/329  soft-tissue]
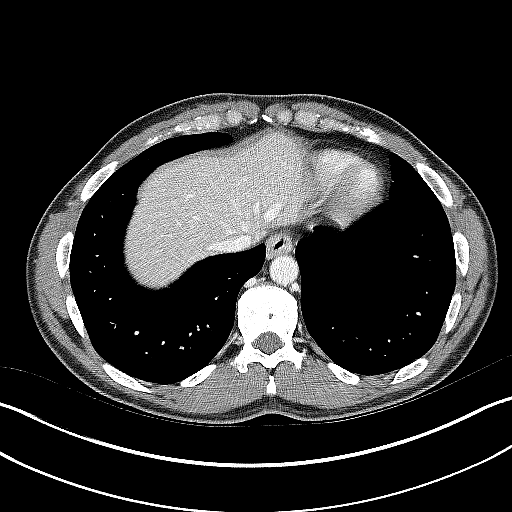
[im 214/329  lung]
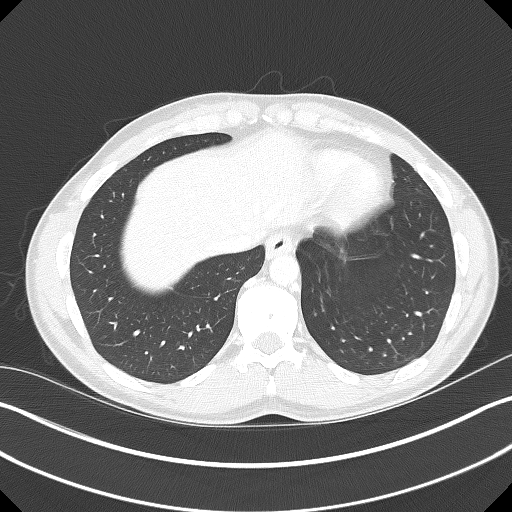
[im 230/329  lung]
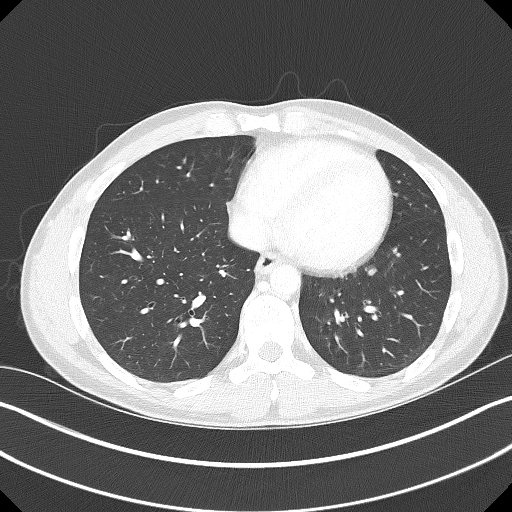
[im 263/329  lung]
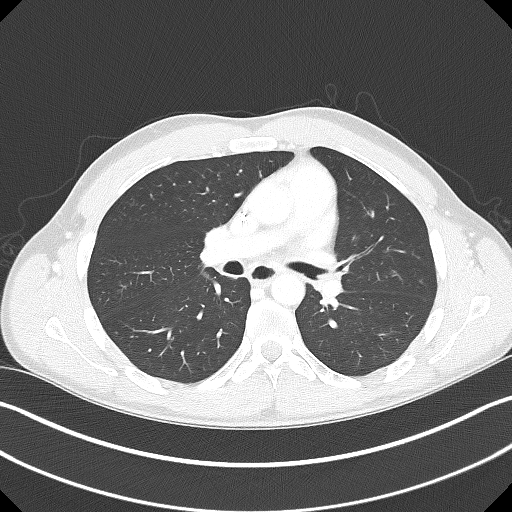
[im 279/329  lung]
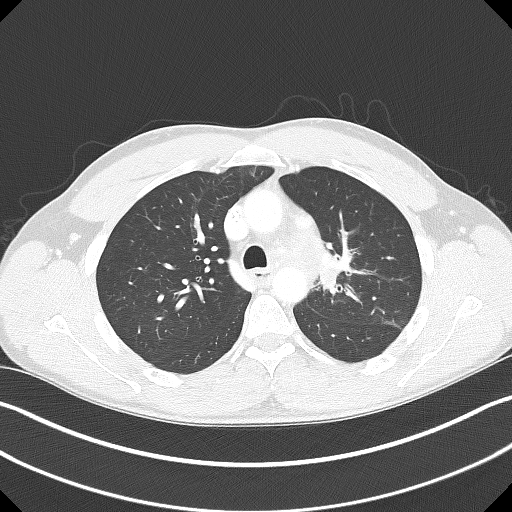
[im 312/329  soft-tissue]
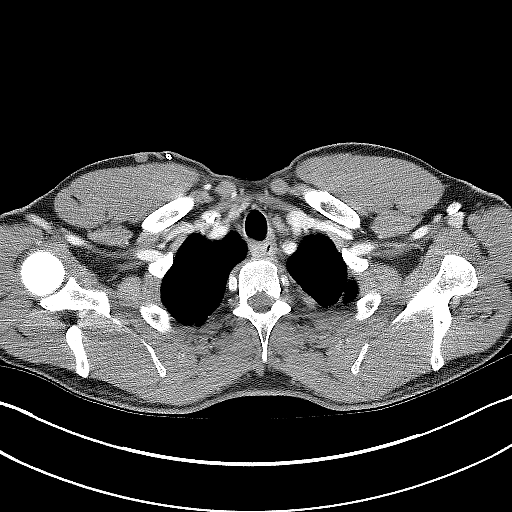
[im 312/329  lung]
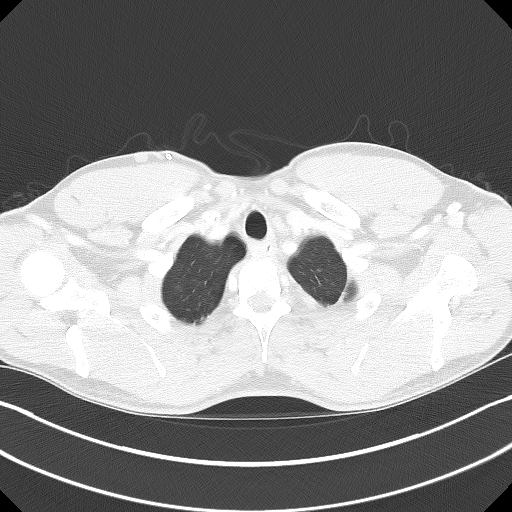

[13 of 46 positions shown; findings below may reference images not displayed]

FINDINGS: CT CHEST FINDINGS

Cardiovascular: The heart size is normal. No substantial pericardial
effusion. Right Port-A-Cath tip is at the SVC/RA junction.

Mediastinum/Nodes: AP window mass measures 4.0 x 3.1 cm today
compared to 4.4 x 3.3 cm on [DATE]. This lesion was not well
demonstrated on PET-CT performed without intravenous contrast.

Left hilar lesion measured on previous CT at [DATE] x 2.5 cm now
measures 1.2 x 0.9 cm.

No right hilar lymphadenopathy. The esophagus has normal imaging
features. There is no axillary lymphadenopathy.

Lungs/Pleura: The left apical lung mass measured previously at 3.8 x
3.3 cm now measures 2.3 x 1.4 cm.

Pleural metastatic lesion with scalloping of the adjacent posterior
left third rib has also decreased.

8 mm medial right middle lobe (confirmed on sagittal imaging) nodule
is identified on 54/3, new in the interval.

mm right lower lobe nodule on 70/3 is stable.

No pleural effusion. No pulmonary edema. Centrilobular emphsyema
noted.

Musculoskeletal: No worrisome lytic or sclerotic osseous
abnormality.

CT ABDOMEN PELVIS FINDINGS

Hepatobiliary: 11 mm low-density lesions identified in the dome of
the left liver on 48/2. This was not definitely seen on previous
PET-CT or CTA chest. No other focal abnormality within the liver
parenchyma. There is no evidence for gallstones, gallbladder wall
thickening, or pericholecystic fluid. No intrahepatic or
extrahepatic biliary dilation.

Pancreas: No focal mass lesion. No dilatation of the main duct. No
intraparenchymal cyst. No peripancreatic edema.

Spleen: No splenomegaly. No focal mass lesion.

Adrenals/Urinary Tract: No adrenal nodule or mass. Kidneys
unremarkable. No evidence for hydroureter. The urinary bladder
appears normal for the degree of distention.

Stomach/Bowel: Stomach is unremarkable. No gastric wall thickening.
No evidence of outlet obstruction. Duodenum is normally positioned
as is the ligament of Treitz. No small bowel wall thickening. No
small bowel dilatation. The terminal ileum is normal. Normal
appendix visualized on coronal 72/4. No gross colonic mass. No
colonic wall thickening.

Vascular/Lymphatic: No abdominal aortic aneurysm. No abdominal
lymphadenopathy No pelvic sidewall lymphadenopathy.

Reproductive: The prostate gland and seminal vesicles are
unremarkable.

Other: No intraperitoneal free fluid.

Musculoskeletal: No worrisome lytic or sclerotic osseous
abnormality.
IMPRESSION: 1. Interval decrease in size of the left apical lung mass. The
pleural lesion, left hilar lymphadenopathy, and AP window metastatic
disease has also decreased since the prior study.
2. New 8 mm medial right middle lobe pulmonary nodule. This may be
infectious/inflammatory but close follow-up recommended to exclude
metastatic disease. Rapid interval appearance makes metachronous
primary less likely.
3. 11 mm low-density lesion in the dome of the left liver, not
definitely seen on previous PET-CT or CTA chest. Finding is
indeterminate on today's study. Attention on follow-up recommended
to ensure stability. If additional characterization is warranted at
this time, MRI with and without contrast may prove helpful.

## 2020-02-03 MED ORDER — IOHEXOL 300 MG/ML  SOLN
100.0000 mL | Freq: Once | INTRAMUSCULAR | Status: AC | PRN
  Administered 2020-02-03: 10:00:00 100 mL via INTRAVENOUS

## 2020-02-04 ENCOUNTER — Other Ambulatory Visit: Payer: Self-pay | Admitting: Oncology

## 2020-02-04 DIAGNOSIS — C3412 Malignant neoplasm of upper lobe, left bronchus or lung: Secondary | ICD-10-CM

## 2020-02-04 NOTE — Progress Notes (Signed)
DISCONTINUE OFF PATHWAY REGIMEN - Other   [id number]:Carboplatin + Paclitaxel (2/50) + RT weekly x 6 weeks:   Administer weekly during RT:     Paclitaxel      Carboplatin   **Always confirm dose/schedule in your pharmacy ordering system**  REASON: Other Reason PRIOR TREATMENT: Carboplatin + Paclitaxel (2/50) + RT weekly x 6 weeks TREATMENT RESPONSE: Partial Response (PR)  START OFF PATHWAY REGIMEN - Other   [id number]:Carboplatin AUC=6 IV D1 + Paclitaxel 200 mg/m2 IV D1 + Pembrolizumab 200 mg IV D1 q21 Days x 4 Cycles:   A cycle is every 21 days:     Pembrolizumab      Paclitaxel      Carboplatin   **Always confirm dose/schedule in your pharmacy ordering system**  Patient Characteristics: Intent of Therapy: Non-Curative / Palliative Intent, Discussed with Patient

## 2020-02-04 NOTE — Progress Notes (Signed)
OFF PATHWAY REGIMEN - Other  No Change  Continue With Treatment as Ordered.   [id number]:Carboplatin + Paclitaxel (2/50) + RT weekly x 6 weeks:   Administer weekly during RT:     Paclitaxel      Carboplatin   **Always confirm dose/schedule in your pharmacy ordering system**  Patient Characteristics: Intent of Therapy: Non-Curative / Palliative Intent, Discussed with Patient

## 2020-02-05 ENCOUNTER — Other Ambulatory Visit: Payer: Self-pay | Admitting: Oncology

## 2020-02-06 ENCOUNTER — Other Ambulatory Visit: Payer: 59

## 2020-02-06 ENCOUNTER — Ambulatory Visit: Payer: 59 | Admitting: Oncology

## 2020-02-06 ENCOUNTER — Ambulatory Visit: Payer: 59 | Admitting: Radiation Oncology

## 2020-02-06 ENCOUNTER — Ambulatory Visit: Payer: 59

## 2020-02-07 ENCOUNTER — Inpatient Hospital Stay: Payer: 59 | Attending: Oncology

## 2020-02-07 ENCOUNTER — Ambulatory Visit: Payer: 59 | Admitting: Radiation Oncology

## 2020-02-07 ENCOUNTER — Encounter: Payer: Self-pay | Admitting: *Deleted

## 2020-02-07 ENCOUNTER — Inpatient Hospital Stay: Payer: 59

## 2020-02-07 ENCOUNTER — Other Ambulatory Visit: Payer: Self-pay

## 2020-02-07 ENCOUNTER — Inpatient Hospital Stay (HOSPITAL_BASED_OUTPATIENT_CLINIC_OR_DEPARTMENT_OTHER): Payer: 59 | Admitting: Oncology

## 2020-02-07 VITALS — BP 118/85 | HR 89 | Temp 98.4°F | Resp 16 | Wt 177.2 lb

## 2020-02-07 DIAGNOSIS — Z7689 Persons encountering health services in other specified circumstances: Secondary | ICD-10-CM | POA: Diagnosis not present

## 2020-02-07 DIAGNOSIS — Z5111 Encounter for antineoplastic chemotherapy: Secondary | ICD-10-CM | POA: Insufficient documentation

## 2020-02-07 DIAGNOSIS — D701 Agranulocytosis secondary to cancer chemotherapy: Secondary | ICD-10-CM

## 2020-02-07 DIAGNOSIS — C3412 Malignant neoplasm of upper lobe, left bronchus or lung: Secondary | ICD-10-CM

## 2020-02-07 DIAGNOSIS — R7989 Other specified abnormal findings of blood chemistry: Secondary | ICD-10-CM

## 2020-02-07 DIAGNOSIS — C779 Secondary and unspecified malignant neoplasm of lymph node, unspecified: Secondary | ICD-10-CM | POA: Insufficient documentation

## 2020-02-07 DIAGNOSIS — Z79899 Other long term (current) drug therapy: Secondary | ICD-10-CM | POA: Insufficient documentation

## 2020-02-07 DIAGNOSIS — R5383 Other fatigue: Secondary | ICD-10-CM | POA: Insufficient documentation

## 2020-02-07 DIAGNOSIS — C7951 Secondary malignant neoplasm of bone: Secondary | ICD-10-CM | POA: Insufficient documentation

## 2020-02-07 DIAGNOSIS — R531 Weakness: Secondary | ICD-10-CM | POA: Insufficient documentation

## 2020-02-07 DIAGNOSIS — Z7189 Other specified counseling: Secondary | ICD-10-CM | POA: Diagnosis not present

## 2020-02-07 DIAGNOSIS — Z5112 Encounter for antineoplastic immunotherapy: Secondary | ICD-10-CM | POA: Insufficient documentation

## 2020-02-07 DIAGNOSIS — Z87891 Personal history of nicotine dependence: Secondary | ICD-10-CM | POA: Diagnosis not present

## 2020-02-07 DIAGNOSIS — T451X5A Adverse effect of antineoplastic and immunosuppressive drugs, initial encounter: Secondary | ICD-10-CM | POA: Insufficient documentation

## 2020-02-07 DIAGNOSIS — R945 Abnormal results of liver function studies: Secondary | ICD-10-CM

## 2020-02-07 DIAGNOSIS — Z95828 Presence of other vascular implants and grafts: Secondary | ICD-10-CM

## 2020-02-07 LAB — COMPREHENSIVE METABOLIC PANEL
ALT: 58 U/L — ABNORMAL HIGH (ref 0–44)
AST: 34 U/L (ref 15–41)
Albumin: 3.8 g/dL (ref 3.5–5.0)
Alkaline Phosphatase: 116 U/L (ref 38–126)
Anion gap: 9 (ref 5–15)
BUN: 12 mg/dL (ref 6–20)
CO2: 25 mmol/L (ref 22–32)
Calcium: 9 mg/dL (ref 8.9–10.3)
Chloride: 102 mmol/L (ref 98–111)
Creatinine, Ser: 0.95 mg/dL (ref 0.61–1.24)
GFR calc Af Amer: >60 mL/min (ref >60)
GFR calc non Af Amer: >60 mL/min (ref >60)
Glucose, Bld: 123 mg/dL — ABNORMAL HIGH (ref 70–99)
Potassium: 3.8 mmol/L (ref 3.5–5.1)
Sodium: 136 mmol/L (ref 135–145)
Total Bilirubin: 0.6 mg/dL (ref 0.3–1.2)
Total Protein: 7.5 g/dL (ref 6.5–8.1)

## 2020-02-07 LAB — CBC WITH DIFFERENTIAL/PLATELET
Abs Immature Granulocytes: 0.05 10*3/uL (ref 0.00–0.07)
Basophils Absolute: 0 10*3/uL (ref 0.0–0.1)
Basophils Relative: 1 %
Eosinophils Absolute: 0 10*3/uL (ref 0.0–0.5)
Eosinophils Relative: 1 %
HCT: 36.7 % — ABNORMAL LOW (ref 39.0–52.0)
Hemoglobin: 11.7 g/dL — ABNORMAL LOW (ref 13.0–17.0)
Immature Granulocytes: 2 %
Lymphocytes Relative: 20 %
Lymphs Abs: 0.6 10*3/uL — ABNORMAL LOW (ref 0.7–4.0)
MCH: 29.3 pg (ref 26.0–34.0)
MCHC: 31.9 g/dL (ref 30.0–36.0)
MCV: 92 fL (ref 80.0–100.0)
Monocytes Absolute: 0.7 10*3/uL (ref 0.1–1.0)
Monocytes Relative: 27 %
Neutro Abs: 1.4 10*3/uL — ABNORMAL LOW (ref 1.7–7.7)
Neutrophils Relative %: 49 %
Platelets: 255 10*3/uL (ref 150–400)
RBC: 3.99 MIL/uL — ABNORMAL LOW (ref 4.22–5.81)
RDW: 18.7 % — ABNORMAL HIGH (ref 11.5–15.5)
WBC: 2.8 10*3/uL — ABNORMAL LOW (ref 4.0–10.5)
nRBC: 0 % (ref 0.0–0.2)

## 2020-02-07 LAB — TSH: TSH: 0.423 u[IU]/mL (ref 0.350–4.500)

## 2020-02-07 MED ORDER — HEPARIN SOD (PORK) LOCK FLUSH 100 UNIT/ML IV SOLN
500.0000 [IU] | Freq: Once | INTRAVENOUS | Status: AC
  Administered 2020-02-07: 09:00:00 500 [IU] via INTRAVENOUS
  Filled 2020-02-07: qty 5

## 2020-02-07 MED ORDER — SODIUM CHLORIDE 0.9% FLUSH
10.0000 mL | Freq: Once | INTRAVENOUS | Status: AC
  Administered 2020-02-07: 08:00:00 10 mL via INTRAVENOUS
  Filled 2020-02-07: qty 10

## 2020-02-07 MED ORDER — FILGRASTIM-SNDZ 480 MCG/0.8ML IJ SOSY
480.0000 ug | PREFILLED_SYRINGE | Freq: Every day | INTRAMUSCULAR | Status: AC
  Administered 2020-02-07: 480 ug via SUBCUTANEOUS
  Filled 2020-02-07: qty 0.8

## 2020-02-08 LAB — T4: T4, Total: 6.1 ug/dL (ref 4.5–12.0)

## 2020-02-10 ENCOUNTER — Inpatient Hospital Stay: Payer: 59

## 2020-02-10 ENCOUNTER — Ambulatory Visit: Payer: 59

## 2020-02-10 ENCOUNTER — Other Ambulatory Visit: Payer: Self-pay

## 2020-02-10 ENCOUNTER — Encounter: Payer: Self-pay | Admitting: Oncology

## 2020-02-10 DIAGNOSIS — C3412 Malignant neoplasm of upper lobe, left bronchus or lung: Secondary | ICD-10-CM | POA: Diagnosis not present

## 2020-02-10 MED ORDER — FILGRASTIM-SNDZ 480 MCG/0.8ML IJ SOSY
480.0000 ug | PREFILLED_SYRINGE | Freq: Every day | INTRAMUSCULAR | Status: DC
  Administered 2020-02-10: 16:00:00 480 ug via SUBCUTANEOUS
  Filled 2020-02-10: qty 0.8

## 2020-02-10 NOTE — Progress Notes (Signed)
Hematology/Oncology Consult note [facility]  Telephone:([phone] [phone]  Patient Care Team: Patient, No Pcp Per as PCP - General ([place]) Telford Nab, RN as Registered Nurse   Name of the patient: Kyle Guerrero  [id number]  02-11-1971   Date of visit: 02/10/20  Diagnosis-  Non-small cell lung cancer stage IV acT2 cN2 cM1 a with pleural involvement  Chief complaint/ Reason for visit-on treatment assessment prior to cycle 1 of carbotaxol Keytruda  Heme/Onc history: patient is a 49 year old male who presented to the ER with symptoms ofheaviness in his chest and upper left chest discomfort he underwent CT angio chest to rule out PE which showed 3.8 x 3.3 cm left upper palpable lung mass along with 4.4 x 3.3 cm lobulated mass in the aortopulmonary window and 2.6 x 2.4 cm left hilar mass all concerning for malignancy. Patient has also seen pulmonary and has been set up for bronchoscopy and EBUS guided biopsy on 11/23/2019. Patient underwent. PET CT scan which showed a hypermetabolic spiculated 3.5 cm of 5 left upper lobe lung mass, adjacent hypermetabolic 3.2 x 1.2 cm pleural metastases in the medial posterior by the left pleural space along with scalloping of the adjacent posterior left third rib. Hypermetabolic infiltrative left perihilar conglomerate nodal metastases measuring up to 7.3 x 3.6 cm and 0.8 cm high left mediastinal node between the left brachiocephalic vein and left subclavian artery. No evidence of distant metastatic disease  Biopsy showed non-small cell lung cancer but further characterization could not be determined. Insufficient tissue for NGS testing. Repeat biopsy done. Results of NGS testing showed PD-L1 50%.  Tumor mutational burden high.  ERBB2 copy number again. 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    Interval history-patient feels well presently and denies any complaints at this time.   Shortness of breath has improved  ECOG PS- 1 Pain scale- 0   Review of systems- Review of Systems  Constitutional: Positive for malaise/fatigue. Negative for chills, fever and weight loss.  HENT: Negative for congestion, ear discharge and nosebleeds.   Eyes: Negative for blurred vision.  Respiratory: Negative for cough, hemoptysis, sputum production, shortness of breath and wheezing.   Cardiovascular: Negative for chest pain, palpitations, orthopnea and claudication.  Gastrointestinal: Negative for abdominal pain, blood in stool, constipation, diarrhea, heartburn, melena, nausea and vomiting.  Genitourinary: Negative for dysuria, flank pain, frequency, hematuria and urgency.  Musculoskeletal: Negative for back pain, joint pain and myalgias.  Skin: Negative for rash.  Neurological: Negative for dizziness, tingling, focal weakness, seizures, weakness and headaches.  Endo/Heme/Allergies: Does not bruise/bleed easily.  Psychiatric/Behavioral: Negative for depression and suicidal ideas. The patient does not have insomnia.       No Known Allergies   Past Medical History:  Diagnosis Date  . Asthma   . Cancer [facility])      Past Surgical History:  Procedure Laterality Date  . CYST EXCISION    . IR IMAGING GUIDED PORT INSERTION  12/05/2019  . VIDEO BRONCHOSCOPY WITH ENDOBRONCHIAL ULTRASOUND Left 11/22/2019   Procedure: VIDEO BRONCHOSCOPY WITH ENDOBRONCHIAL ULTRASOUND;  Surgeon: Tyler Pita, MD;  Location: [facility] ORS;  Service: Thoracic;  Laterality: Left;    Social History   Socioeconomic History  . Marital status: Single    Spouse name: Not on file  . Number of children: Not on file  . Years of education: Not on file  . Highest education level: Not on file  Occupational History  . Not on  file  Tobacco Use  . Smoking status: Former Smoker    Packs/day: 2.00    Types: Cigarettes    Quit date: 11/08/2019    Years since quitting: 0.2  . Smokeless tobacco: Never Used   Substance and Sexual Activity  . Alcohol use: Not Currently  . Drug use: Yes    Types: Marijuana  . Sexual activity: Not on file  Other Topics Concern  . Not on file  Social History Narrative  . Not on file   Social Determinants of Health   Financial Resource Strain:   . Difficulty of Paying Living Expenses: Not on file  Food Insecurity:   . Worried About Charity fundraiser in the Last Year: Not on file  . Ran Out of Food in the Last Year: Not on file  Transportation Needs:   . Lack of Transportation (Medical): Not on file  . Lack of Transportation (Non-Medical): Not on file  Physical Activity:   . Days of Exercise per Week: Not on file  . Minutes of Exercise per Session: Not on file  Stress:   . Feeling of Stress : Not on file  Social Connections:   . Frequency of Communication with Friends and Family: Not on file  . Frequency of Social Gatherings with Friends and Family: Not on file  . Attends Religious Services: Not on file  . Active Member of Clubs or Organizations: Not on file  . Attends Archivist Meetings: Not on file  . Marital Status: Not on file  Intimate Partner Violence:   . Fear of Current or Ex-Partner: Not on file  . Emotionally Abused: Not on file  . Physically Abused: Not on file  . Sexually Abused: Not on file    Family History  Problem Relation Age of Onset  . Healthy Mother   . Healthy Father      Current Outpatient Medications:  .  azelastine (ASTELIN) 0.1 % nasal spray, Place 1 spray into the nose as needed. , Disp: , Rfl:  .  sucralfate (CARAFATE) 1 g tablet, Take 1 tablet (1 g total) by mouth 3 (three) times daily before meals. Dissolve in warm water, swish and swallow (Patient taking differently: Take 1 g by mouth 4 (four) times daily as needed. Dissolve in warm water, swish and swallow), Disp: 90 tablet, Rfl: 6 No current facility-administered medications for this visit.  Facility-Administered Medications Ordered in Other  Visits:  .  filgrastim-sndz (ZARXIO) injection 480 mcg, 480 mcg, Subcutaneous, Daily, Sindy Guadeloupe, MD, 480 mcg at 02/07/20 1003  Physical exam:  Vitals:   02/07/20 0851  BP: 118/85  Pulse: 89  Resp: 16  Temp: 98.4 F (36.9 C)  TempSrc: Tympanic  Weight: 177 lb 3.2 oz (80.4 kg)   Physical Exam HENT:     Head: Normocephalic and atraumatic.  Eyes:     Pupils: Pupils are equal, round, and reactive to light.  Cardiovascular:     Rate and Rhythm: Normal rate and regular rhythm.     Heart sounds: Normal heart sounds.  Pulmonary:     Effort: Pulmonary effort is normal.     Breath sounds: Normal breath sounds.  Abdominal:     General: Bowel sounds are normal.     Palpations: Abdomen is soft.  Musculoskeletal:     Cervical back: Normal range of motion.     Comments: Clubbing +  Skin:    General: Skin is warm and dry.  Neurological:  Mental Status: He is alert and oriented to person, place, and time.      CMP Latest Ref Rng & Units 02/07/2020  Glucose 70 - 99 mg/dL 123(H)  BUN 6 - 20 mg/dL 12  Creatinine 0.61 - 1.24 mg/dL 0.95  Sodium 135 - 145 mmol/L 136  Potassium 3.5 - 5.1 mmol/L 3.8  Chloride 98 - 111 mmol/L 102  CO2 22 - 32 mmol/L 25  Calcium 8.9 - 10.3 mg/dL 9.0  Total Protein 6.5 - 8.1 g/dL 7.5  Total Bilirubin 0.3 - 1.2 mg/dL 0.6  Alkaline Phos 38 - 126 U/L 116  AST 15 - 41 U/L 34  ALT 0 - 44 U/L 58(H)   CBC Latest Ref Rng & Units 02/07/2020  WBC 4.0 - 10.5 K/uL 2.8(L)  Hemoglobin 13.0 - 17.0 g/dL 11.7(L)  Hematocrit 39.0 - 52.0 % 36.7(L)  Platelets 150 - 400 K/uL 255    No images are attached to the encounter.  CT Chest W Contrast  Result Date: 02/03/2020 CLINICAL DATA:  Lung cancer. Restaging. EXAM: CT CHEST, ABDOMEN, AND PELVIS WITH CONTRAST TECHNIQUE: Multidetector CT imaging of the chest, abdomen and pelvis was performed following the standard protocol during bolus administration of intravenous contrast. CONTRAST:  115m OMNIPAQUE IOHEXOL 300 MG/ML   SOLN COMPARISON:  PET-CT 11/18/2019. Chest CTA 11/04/2019. FINDINGS: CT CHEST FINDINGS Cardiovascular: The heart size is normal. No substantial pericardial effusion. Right Port-A-Cath tip is at the SVC/RA junction. Mediastinum/Nodes: AP window mass measures 4.0 x 3.1 cm today compared to 4.4 x 3.3 cm on 11/04/2019. This lesion was not well demonstrated on PET-CT performed without intravenous contrast. Left hilar lesion measured on previous CT at 2.6 x 2.5 cm now measures 1.2 x 0.9 cm. No right hilar lymphadenopathy. The esophagus has normal imaging features. There is no axillary lymphadenopathy. Lungs/Pleura: The left apical lung mass measured previously at 3.8 x 3.3 cm now measures 2.3 x 1.4 cm. Pleural metastatic lesion with scalloping of the adjacent posterior left third rib has also decreased. 8 mm medial right middle lobe (confirmed on sagittal imaging) nodule is identified on 54/3, new in the interval. 3 mm anterior subpleural nodule right lung on 54/3 is unchanged. 4 mm right lower lobe nodule on 70/3 is stable. No pleural effusion. No pulmonary edema. Centrilobular emphsyema noted. Musculoskeletal: No worrisome lytic or sclerotic osseous abnormality. CT ABDOMEN PELVIS FINDINGS Hepatobiliary: 11 mm low-density lesions identified in the dome of the left liver on 48/2. This was not definitely seen on previous PET-CT or CTA chest. No other focal abnormality within the liver parenchyma. There is no evidence for gallstones, gallbladder wall thickening, or pericholecystic fluid. No intrahepatic or extrahepatic biliary dilation. Pancreas: No focal mass lesion. No dilatation of the main duct. No intraparenchymal cyst. No peripancreatic edema. Spleen: No splenomegaly. No focal mass lesion. Adrenals/Urinary Tract: No adrenal nodule or mass. Kidneys unremarkable. No evidence for hydroureter. The urinary bladder appears normal for the degree of distention. Stomach/Bowel: Stomach is unremarkable. No gastric wall  thickening. No evidence of outlet obstruction. Duodenum is normally positioned as is the ligament of Treitz. No small bowel wall thickening. No small bowel dilatation. The terminal ileum is normal. Normal appendix visualized on coronal 72/4. No gross colonic mass. No colonic wall thickening. Vascular/Lymphatic: No abdominal aortic aneurysm. No abdominal lymphadenopathy No pelvic sidewall lymphadenopathy. Reproductive: The prostate gland and seminal vesicles are unremarkable. Other: No intraperitoneal free fluid. Musculoskeletal: No worrisome lytic or sclerotic osseous abnormality. IMPRESSION: 1. Interval decrease in size of the  left apical lung mass. The pleural lesion, left hilar lymphadenopathy, and AP window metastatic disease has also decreased since the prior study. 2. New 8 mm medial right middle lobe pulmonary nodule. This may be infectious/inflammatory but close follow-up recommended to exclude metastatic disease. Rapid interval appearance makes metachronous primary less likely. 3. 11 mm low-density lesion in the dome of the left liver, not definitely seen on previous PET-CT or CTA chest. Finding is indeterminate on today's study. Attention on follow-up recommended to ensure stability. If additional characterization is warranted at this time, MRI with and without contrast may prove helpful. Electronically Signed   By: Misty Stanley M.D.   On: 02/03/2020 11:07   CT Abdomen Pelvis W Contrast  Result Date: 02/03/2020 CLINICAL DATA:  Lung cancer. Restaging. EXAM: CT CHEST, ABDOMEN, AND PELVIS WITH CONTRAST TECHNIQUE: Multidetector CT imaging of the chest, abdomen and pelvis was performed following the standard protocol during bolus administration of intravenous contrast. CONTRAST:  117m OMNIPAQUE IOHEXOL 300 MG/ML  SOLN COMPARISON:  PET-CT 11/18/2019. Chest CTA 11/04/2019. FINDINGS: CT CHEST FINDINGS Cardiovascular: The heart size is normal. No substantial pericardial effusion. Right Port-A-Cath tip is at  the SVC/RA junction. Mediastinum/Nodes: AP window mass measures 4.0 x 3.1 cm today compared to 4.4 x 3.3 cm on 11/04/2019. This lesion was not well demonstrated on PET-CT performed without intravenous contrast. Left hilar lesion measured on previous CT at 2.6 x 2.5 cm now measures 1.2 x 0.9 cm. No right hilar lymphadenopathy. The esophagus has normal imaging features. There is no axillary lymphadenopathy. Lungs/Pleura: The left apical lung mass measured previously at 3.8 x 3.3 cm now measures 2.3 x 1.4 cm. Pleural metastatic lesion with scalloping of the adjacent posterior left third rib has also decreased. 8 mm medial right middle lobe (confirmed on sagittal imaging) nodule is identified on 54/3, new in the interval. 3 mm anterior subpleural nodule right lung on 54/3 is unchanged. 4 mm right lower lobe nodule on 70/3 is stable. No pleural effusion. No pulmonary edema. Centrilobular emphsyema noted. Musculoskeletal: No worrisome lytic or sclerotic osseous abnormality. CT ABDOMEN PELVIS FINDINGS Hepatobiliary: 11 mm low-density lesions identified in the dome of the left liver on 48/2. This was not definitely seen on previous PET-CT or CTA chest. No other focal abnormality within the liver parenchyma. There is no evidence for gallstones, gallbladder wall thickening, or pericholecystic fluid. No intrahepatic or extrahepatic biliary dilation. Pancreas: No focal mass lesion. No dilatation of the main duct. No intraparenchymal cyst. No peripancreatic edema. Spleen: No splenomegaly. No focal mass lesion. Adrenals/Urinary Tract: No adrenal nodule or mass. Kidneys unremarkable. No evidence for hydroureter. The urinary bladder appears normal for the degree of distention. Stomach/Bowel: Stomach is unremarkable. No gastric wall thickening. No evidence of outlet obstruction. Duodenum is normally positioned as is the ligament of Treitz. No small bowel wall thickening. No small bowel dilatation. The terminal ileum is normal.  Normal appendix visualized on coronal 72/4. No gross colonic mass. No colonic wall thickening. Vascular/Lymphatic: No abdominal aortic aneurysm. No abdominal lymphadenopathy No pelvic sidewall lymphadenopathy. Reproductive: The prostate gland and seminal vesicles are unremarkable. Other: No intraperitoneal free fluid. Musculoskeletal: No worrisome lytic or sclerotic osseous abnormality. IMPRESSION: 1. Interval decrease in size of the left apical lung mass. The pleural lesion, left hilar lymphadenopathy, and AP window metastatic disease has also decreased since the prior study. 2. New 8 mm medial right middle lobe pulmonary nodule. This may be infectious/inflammatory but close follow-up recommended to exclude metastatic disease. Rapid interval appearance  makes metachronous primary less likely. 3. 11 mm low-density lesion in the dome of the left liver, not definitely seen on previous PET-CT or CTA chest. Finding is indeterminate on today's study. Attention on follow-up recommended to ensure stability. If additional characterization is warranted at this time, MRI with and without contrast may prove helpful. Electronically Signed   By: Misty Stanley M.D.   On: 02/03/2020 11:07     Assessment and plan- Patient is a 49 y.o. male with Stage IVa non-small cell lung cancer cT2 cN2 cM1a.    He is s/p concurrent chemoradiation with carbotaxol for 7 weeks and here for on treatment assessment prior to cycle 1 of carbotaxol Beryle Flock  It has been 3 weeks since the start of his chemotherapy.  Today his white cell count is 2.8 with an ANC of 1.4 which is lower than his ANC of  1.6 weeks ago.  I suspect this is due to prolonged myelosuppression from chemotherapy.  I will hold off on starting carbotaxol Keytruda chemotherapy today and give him 3 doses of neupogen to improve his white cell count.   I will tentatively see him back in 1 week's time with CBC with differential, CMP to start cycle 1 of carbotaxol Keytruda.  He did  have abnormal LFTs when he was receiving weekly carbotaxol which has improved significantly.  Plan is to do up to 4 cycles of carbotaxol Keytruda followed by [facility] until progression or toxicity  I have reviewed CT chest abdomen and pelvis images independently and discussed findings with the patient.  Overall he has responded well to carbotaxol and radiation so far and there has been decrease in the size of his bulky AP window mass as well as left hilar lymph adenopathy.  Left apical lung mass is also reduced in size as well as the pleural metastatic lesion.  There is a new 8 mm right middle lobe pulmonary nodule which we will continue to monitor.  Treatment will be given with a palliative intent   Visit Diagnosis 1. Malignant neoplasm of upper lobe of left lung ([place])   2. Goals of care, counseling/discussion   3. Chemotherapy induced neutropenia (HCC)   4. Abnormal LFTs      Dr. Randa Evens, MD, MPH [facility] [phone] 02/10/2020 8:42 AM

## 2020-02-11 ENCOUNTER — Inpatient Hospital Stay: Payer: 59

## 2020-02-11 ENCOUNTER — Other Ambulatory Visit: Payer: Self-pay

## 2020-02-11 DIAGNOSIS — C3412 Malignant neoplasm of upper lobe, left bronchus or lung: Secondary | ICD-10-CM | POA: Diagnosis not present

## 2020-02-11 LAB — CBC WITH DIFFERENTIAL/PLATELET
Abs Immature Granulocytes: 0.85 10*3/uL — ABNORMAL HIGH (ref 0.00–0.07)
Basophils Absolute: 0.1 10*3/uL (ref 0.0–0.1)
Basophils Relative: 0 %
Eosinophils Absolute: 0.1 10*3/uL (ref 0.0–0.5)
Eosinophils Relative: 0 %
HCT: 36.3 % — ABNORMAL LOW (ref 39.0–52.0)
Hemoglobin: 11.6 g/dL — ABNORMAL LOW (ref 13.0–17.0)
Immature Granulocytes: 3 %
Lymphocytes Relative: 5 %
Lymphs Abs: 1.2 10*3/uL (ref 0.7–4.0)
MCH: 29.4 pg (ref 26.0–34.0)
MCHC: 32 g/dL (ref 30.0–36.0)
MCV: 92.1 fL (ref 80.0–100.0)
Monocytes Absolute: 2.4 10*3/uL — ABNORMAL HIGH (ref 0.1–1.0)
Monocytes Relative: 10 %
Neutro Abs: 20.1 10*3/uL — ABNORMAL HIGH (ref 1.7–7.7)
Neutrophils Relative %: 82 %
Platelets: 229 10*3/uL (ref 150–400)
RBC: 3.94 MIL/uL — ABNORMAL LOW (ref 4.22–5.81)
RDW: 19.2 % — ABNORMAL HIGH (ref 11.5–15.5)
WBC: 24.7 10*3/uL — ABNORMAL HIGH (ref 4.0–10.5)
nRBC: 0 % (ref 0.0–0.2)

## 2020-02-11 LAB — COMPREHENSIVE METABOLIC PANEL
ALT: 37 U/L (ref 0–44)
AST: 27 U/L (ref 15–41)
Albumin: 4 g/dL (ref 3.5–5.0)
Alkaline Phosphatase: 131 U/L — ABNORMAL HIGH (ref 38–126)
Anion gap: 9 (ref 5–15)
BUN: 13 mg/dL (ref 6–20)
CO2: 27 mmol/L (ref 22–32)
Calcium: 9.1 mg/dL (ref 8.9–10.3)
Chloride: 103 mmol/L (ref 98–111)
Creatinine, Ser: 1.09 mg/dL (ref 0.61–1.24)
GFR calc Af Amer: >60 mL/min (ref >60)
GFR calc non Af Amer: >60 mL/min (ref >60)
Glucose, Bld: 81 mg/dL (ref 70–99)
Potassium: 3.9 mmol/L (ref 3.5–5.1)
Sodium: 139 mmol/L (ref 135–145)
Total Bilirubin: 0.3 mg/dL (ref 0.3–1.2)
Total Protein: 7.3 g/dL (ref 6.5–8.1)

## 2020-02-13 ENCOUNTER — Telehealth: Payer: Self-pay | Admitting: Oncology

## 2020-02-13 NOTE — Telephone Encounter (Signed)
[facility] will operate on a 2 hour delay on 02-14-20. Appts have been rescheduled with patient for 02-20-20.

## 2020-02-14 ENCOUNTER — Inpatient Hospital Stay: Payer: 59 | Admitting: Oncology

## 2020-02-14 ENCOUNTER — Inpatient Hospital Stay: Payer: 59

## 2020-02-20 ENCOUNTER — Ambulatory Visit: Payer: 59 | Admitting: Oncology

## 2020-02-20 ENCOUNTER — Ambulatory Visit: Payer: 59

## 2020-02-20 ENCOUNTER — Other Ambulatory Visit: Payer: 59

## 2020-02-21 ENCOUNTER — Inpatient Hospital Stay (HOSPITAL_BASED_OUTPATIENT_CLINIC_OR_DEPARTMENT_OTHER): Payer: 59 | Admitting: Oncology

## 2020-02-21 ENCOUNTER — Inpatient Hospital Stay: Payer: 59

## 2020-02-21 ENCOUNTER — Encounter: Payer: Self-pay | Admitting: Oncology

## 2020-02-21 ENCOUNTER — Other Ambulatory Visit: Payer: Self-pay

## 2020-02-21 VITALS — BP 117/80 | HR 96 | Temp 95.7°F | Resp 16 | Wt 178.3 lb

## 2020-02-21 DIAGNOSIS — C3412 Malignant neoplasm of upper lobe, left bronchus or lung: Secondary | ICD-10-CM

## 2020-02-21 DIAGNOSIS — Z5111 Encounter for antineoplastic chemotherapy: Secondary | ICD-10-CM

## 2020-02-21 DIAGNOSIS — D701 Agranulocytosis secondary to cancer chemotherapy: Secondary | ICD-10-CM | POA: Diagnosis not present

## 2020-02-21 DIAGNOSIS — R7989 Other specified abnormal findings of blood chemistry: Secondary | ICD-10-CM

## 2020-02-21 DIAGNOSIS — R945 Abnormal results of liver function studies: Secondary | ICD-10-CM | POA: Diagnosis not present

## 2020-02-21 DIAGNOSIS — T451X5A Adverse effect of antineoplastic and immunosuppressive drugs, initial encounter: Secondary | ICD-10-CM

## 2020-02-21 DIAGNOSIS — Z5112 Encounter for antineoplastic immunotherapy: Secondary | ICD-10-CM

## 2020-02-21 LAB — CBC WITH DIFFERENTIAL/PLATELET
Abs Immature Granulocytes: 0.04 10*3/uL (ref 0.00–0.07)
Basophils Absolute: 0 10*3/uL (ref 0.0–0.1)
Basophils Relative: 1 %
Eosinophils Absolute: 0.2 10*3/uL (ref 0.0–0.5)
Eosinophils Relative: 7 %
HCT: 37.9 % — ABNORMAL LOW (ref 39.0–52.0)
Hemoglobin: 12 g/dL — ABNORMAL LOW (ref 13.0–17.0)
Immature Granulocytes: 1 %
Lymphocytes Relative: 31 %
Lymphs Abs: 1 10*3/uL (ref 0.7–4.0)
MCH: 29.2 pg (ref 26.0–34.0)
MCHC: 31.7 g/dL (ref 30.0–36.0)
MCV: 92.2 fL (ref 80.0–100.0)
Monocytes Absolute: 0.6 10*3/uL (ref 0.1–1.0)
Monocytes Relative: 20 %
Neutro Abs: 1.3 10*3/uL — ABNORMAL LOW (ref 1.7–7.7)
Neutrophils Relative %: 40 %
Platelets: 305 10*3/uL (ref 150–400)
RBC: 4.11 MIL/uL — ABNORMAL LOW (ref 4.22–5.81)
RDW: 18.2 % — ABNORMAL HIGH (ref 11.5–15.5)
WBC: 3.2 10*3/uL — ABNORMAL LOW (ref 4.0–10.5)
nRBC: 0 % (ref 0.0–0.2)

## 2020-02-21 LAB — COMPREHENSIVE METABOLIC PANEL
ALT: 38 U/L (ref 0–44)
AST: 29 U/L (ref 15–41)
Albumin: 3.8 g/dL (ref 3.5–5.0)
Alkaline Phosphatase: 114 U/L (ref 38–126)
Anion gap: 7 (ref 5–15)
BUN: 17 mg/dL (ref 6–20)
CO2: 25 mmol/L (ref 22–32)
Calcium: 9.2 mg/dL (ref 8.9–10.3)
Chloride: 106 mmol/L (ref 98–111)
Creatinine, Ser: 0.81 mg/dL (ref 0.61–1.24)
GFR calc Af Amer: >60 mL/min (ref >60)
GFR calc non Af Amer: >60 mL/min (ref >60)
Glucose, Bld: 83 mg/dL (ref 70–99)
Potassium: 3.9 mmol/L (ref 3.5–5.1)
Sodium: 138 mmol/L (ref 135–145)
Total Bilirubin: 0.5 mg/dL (ref 0.3–1.2)
Total Protein: 7.3 g/dL (ref 6.5–8.1)

## 2020-02-21 LAB — TSH: TSH: 0.441 u[IU]/mL (ref 0.350–4.500)

## 2020-02-21 MED ORDER — FAMOTIDINE IN NACL 20-0.9 MG/50ML-% IV SOLN
20.0000 mg | Freq: Once | INTRAVENOUS | Status: AC
  Administered 2020-02-21: 10:00:00 20 mg via INTRAVENOUS
  Filled 2020-02-21: qty 50

## 2020-02-21 MED ORDER — HEPARIN SOD (PORK) LOCK FLUSH 100 UNIT/ML IV SOLN
INTRAVENOUS | Status: AC
  Filled 2020-02-21: qty 5

## 2020-02-21 MED ORDER — DIPHENHYDRAMINE HCL 50 MG/ML IJ SOLN
50.0000 mg | Freq: Once | INTRAMUSCULAR | Status: AC
  Administered 2020-02-21: 10:00:00 50 mg via INTRAVENOUS
  Filled 2020-02-21: qty 1

## 2020-02-21 MED ORDER — SODIUM CHLORIDE 0.9% FLUSH
10.0000 mL | Freq: Once | INTRAVENOUS | Status: AC
  Administered 2020-02-21: 08:00:00 10 mL via INTRAVENOUS
  Filled 2020-02-21: qty 10

## 2020-02-21 MED ORDER — SODIUM CHLORIDE 0.9 % IV SOLN
175.0000 mg/m2 | Freq: Once | INTRAVENOUS | Status: AC
  Administered 2020-02-21: 342 mg via INTRAVENOUS
  Filled 2020-02-21: qty 57

## 2020-02-21 MED ORDER — SODIUM CHLORIDE 0.9 % IV SOLN
200.0000 mg | Freq: Once | INTRAVENOUS | Status: AC
  Administered 2020-02-21: 11:00:00 200 mg via INTRAVENOUS
  Filled 2020-02-21: qty 8

## 2020-02-21 MED ORDER — SODIUM CHLORIDE 0.9 % IV SOLN
750.0000 mg | Freq: Once | INTRAVENOUS | Status: AC
  Administered 2020-02-21: 15:00:00 750 mg via INTRAVENOUS
  Filled 2020-02-21: qty 75

## 2020-02-21 MED ORDER — SODIUM CHLORIDE 0.9 % IV SOLN
10.0000 mg | Freq: Once | INTRAVENOUS | Status: AC
  Administered 2020-02-21: 11:00:00 10 mg via INTRAVENOUS
  Filled 2020-02-21: qty 10

## 2020-02-21 MED ORDER — SODIUM CHLORIDE 0.9 % IV SOLN
Freq: Once | INTRAVENOUS | Status: AC
  Filled 2020-02-21: qty 250

## 2020-02-21 MED ORDER — PEGFILGRASTIM 6 MG/0.6ML ~~LOC~~ PSKT
6.0000 mg | PREFILLED_SYRINGE | Freq: Once | SUBCUTANEOUS | Status: AC
  Administered 2020-02-21: 15:00:00 6 mg via SUBCUTANEOUS
  Filled 2020-02-21: qty 0.6

## 2020-02-21 MED ORDER — HEPARIN SOD (PORK) LOCK FLUSH 100 UNIT/ML IV SOLN
500.0000 [IU] | Freq: Once | INTRAVENOUS | Status: AC
  Administered 2020-02-21: 500 [IU] via INTRAVENOUS
  Filled 2020-02-21: qty 5

## 2020-02-21 MED ORDER — SODIUM CHLORIDE 0.9 % IV SOLN
150.0000 mg | Freq: Once | INTRAVENOUS | Status: AC
  Administered 2020-02-21: 150 mg via INTRAVENOUS
  Filled 2020-02-21: qty 5

## 2020-02-21 MED ORDER — PALONOSETRON HCL INJECTION 0.25 MG/5ML
0.2500 mg | Freq: Once | INTRAVENOUS | Status: AC
  Administered 2020-02-21: 0.25 mg via INTRAVENOUS
  Filled 2020-02-21: qty 5

## 2020-02-21 NOTE — Progress Notes (Signed)
ANC 1.3 today. Per Dr Janese Banks may proceed with treatment

## 2020-02-21 NOTE — Progress Notes (Signed)
Pt has no concerns today

## 2020-02-21 NOTE — Progress Notes (Signed)
Hematology/Oncology Consult note [facility]  [phone] [phone]  Patient Care Team: Patient, No Pcp Per as PCP - General ([place]) Telford Nab, RN as Registered Nurse   Name of the patient: Kyle Guerrero  [id number]  1971-01-19   Date of visit: 02/21/20  Diagnosis- Non-small cell lung cancer stage IV acT2 cN2 cM1 a with pleural involvement  Chief complaint/ Reason for visit-on treatment assessment prior to cycle 1 of carbotaxol Keytruda chemotherapy  Heme/Onc history: patient is a 49 year old male who presented to the ER with symptoms ofheaviness in his chest and upper left chest discomfort he underwent CT angio chest to rule out PE which showed 3.8 x 3.3 cm left upper palpable lung mass along with 4.4 x 3.3 cm lobulated mass in the aortopulmonary window and 2.6 x 2.4 cm left hilar mass all concerning for malignancy. Patient has also seen pulmonary and has been set up for bronchoscopy and EBUS guided biopsy on 11/23/2019. Patient underwent. PET CT scan which showed a hypermetabolic spiculated 3.5 cm of 5 left upper lobe lung mass, adjacent hypermetabolic 3.2 x 1.2 cm pleural metastases in the medial posterior by the left pleural space along with scalloping of the adjacent posterior left third rib. Hypermetabolic infiltrative left perihilar conglomerate nodal metastases measuring up to 7.3 x 3.6 cm and 0.8 cm high left mediastinal node between the left brachiocephalic vein and left subclavian artery. No evidence of distant metastatic disease  Biopsy showed non-small cell lung cancer but further characterization could not be determined. Insufficient tissue for NGS testing. Repeat biopsy done. Results of NGS testing showed PD-L1 50%.  Tumor mutational burden high.  ERBB2 copy number again. 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  Interval history-patient reports doing well.  Denies any shortness of breath.   Appetite and weight are stable.  ECOG PS- 1 Pain scale- 0   Review of systems- Review of Systems  Constitutional: Positive for malaise/fatigue. Negative for chills, fever and weight loss.  HENT: Negative for congestion, ear discharge and nosebleeds.   Eyes: Negative for blurred vision.  Respiratory: Negative for cough, hemoptysis, sputum production, shortness of breath and wheezing.   Cardiovascular: Negative for chest pain, palpitations, orthopnea and claudication.  Gastrointestinal: Negative for abdominal pain, blood in stool, constipation, diarrhea, heartburn, melena, nausea and vomiting.  Genitourinary: Negative for dysuria, flank pain, frequency, hematuria and urgency.  Musculoskeletal: Negative for back pain, joint pain and myalgias.  Skin: Negative for rash.  Neurological: Negative for dizziness, tingling, focal weakness, seizures, weakness and headaches.  Endo/Heme/Allergies: Does not bruise/bleed easily.  Psychiatric/Behavioral: Negative for depression and suicidal ideas. The patient does not have insomnia.        No Known Allergies   Past Medical History:  Diagnosis Date  . Asthma   . Cancer [facility])      Past Surgical History:  Procedure Laterality Date  . CYST EXCISION    . IR IMAGING GUIDED PORT INSERTION  12/05/2019  . VIDEO BRONCHOSCOPY WITH ENDOBRONCHIAL ULTRASOUND Left 11/22/2019   Procedure: VIDEO BRONCHOSCOPY WITH ENDOBRONCHIAL ULTRASOUND;  Surgeon: Tyler Pita, MD;  Location: [facility] ORS;  Service: Thoracic;  Laterality: Left;    Social History   Socioeconomic History  . Marital status: Single    Spouse name: Not on file  . Number of children: Not on file  . Years of education: Not on file  . Highest education level: Not on file  Occupational History  . Not on file  Tobacco Use  . Smoking status: Former Smoker    Packs/day: 2.00    Types: Cigarettes    Quit date: 11/08/2019    Years since quitting: 0.2  . Smokeless tobacco: Never Used   Substance and Sexual Activity  . Alcohol use: Not Currently  . Drug use: Yes    Types: Marijuana  . Sexual activity: Not on file  Other Topics Concern  . Not on file  Social History Narrative  . Not on file   Social Determinants of Health   Financial Resource Strain:   . Difficulty of Paying Living Expenses: Not on file  Food Insecurity:   . Worried About Charity fundraiser in the Last Year: Not on file  . Ran Out of Food in the Last Year: Not on file  Transportation Needs:   . Lack of Transportation (Medical): Not on file  . Lack of Transportation (Non-Medical): Not on file  Physical Activity:   . Days of Exercise per Week: Not on file  . Minutes of Exercise per Session: Not on file  Stress:   . Feeling of Stress : Not on file  Social Connections:   . Frequency of Communication with Friends and Family: Not on file  . Frequency of Social Gatherings with Friends and Family: Not on file  . Attends Religious Services: Not on file  . Active Member of Clubs or Organizations: Not on file  . Attends Archivist Meetings: Not on file  . Marital Status: Not on file  Intimate Partner Violence:   . Fear of Current or Ex-Partner: Not on file  . Emotionally Abused: Not on file  . Physically Abused: Not on file  . Sexually Abused: Not on file    Family History  Problem Relation Age of Onset  . Healthy Mother   . Healthy Father      Current Outpatient Medications:  .  azelastine (ASTELIN) 0.1 % nasal spray, Place 1 spray into the nose as needed. , Disp: , Rfl:  .  sucralfate (CARAFATE) 1 g tablet, Take 1 tablet (1 g total) by mouth 3 (three) times daily before meals. Dissolve in warm water, swish and swallow (Patient taking differently: Take 1 g by mouth 4 (four) times daily as needed. Dissolve in warm water, swish and swallow), Disp: 90 tablet, Rfl: 6 No current facility-administered medications for this visit.  Facility-Administered Medications Ordered in Other  Visits:  .  heparin lock flush 100 unit/mL, 500 Units, Intravenous, Once, Sindy Guadeloupe, MD  Physical exam:  Vitals:   02/21/20 0858  BP: 117/80  Pulse: 96  Resp: 16  Temp: (!) 95.7 F (35.4 C)  TempSrc: Tympanic  SpO2: 98%  Weight: 178 lb 4.8 oz (80.9 kg)   Physical Exam HENT:     Head: Normocephalic and atraumatic.  Eyes:     Pupils: Pupils are equal, round, and reactive to light.  Cardiovascular:     Rate and Rhythm: Normal rate and regular rhythm.     Heart sounds: Normal heart sounds.  Pulmonary:     Effort: Pulmonary effort is normal.     Breath sounds: Normal breath sounds.  Abdominal:     General: Bowel sounds are normal.     Palpations: Abdomen is soft.  Musculoskeletal:     Cervical back: Normal range of motion.  Skin:    General: Skin is warm and dry.  Neurological:     Mental Status: He is alert and oriented to person, place,  and time.      CMP Latest Ref Rng & Units 02/21/2020  Glucose 70 - 99 mg/dL 83  BUN 6 - 20 mg/dL 17  Creatinine 0.61 - 1.24 mg/dL 0.81  Sodium 135 - 145 mmol/L 138  Potassium 3.5 - 5.1 mmol/L 3.9  Chloride 98 - 111 mmol/L 106  CO2 22 - 32 mmol/L 25  Calcium 8.9 - 10.3 mg/dL 9.2  Total Protein 6.5 - 8.1 g/dL 7.3  Total Bilirubin 0.3 - 1.2 mg/dL 0.5  Alkaline Phos 38 - 126 U/L 114  AST 15 - 41 U/L 29  ALT 0 - 44 U/L 38   CBC Latest Ref Rng & Units 02/21/2020  WBC 4.0 - 10.5 K/uL 3.2(L)  Hemoglobin 13.0 - 17.0 g/dL 12.0(L)  Hematocrit 39.0 - 52.0 % 37.9(L)  Platelets 150 - 400 K/uL 305    No images are attached to the encounter.  CT Chest W Contrast  Result Date: 02/03/2020 CLINICAL DATA:  Lung cancer. Restaging. EXAM: CT CHEST, ABDOMEN, AND PELVIS WITH CONTRAST TECHNIQUE: Multidetector CT imaging of the chest, abdomen and pelvis was performed following the standard protocol during bolus administration of intravenous contrast. CONTRAST:  166m OMNIPAQUE IOHEXOL 300 MG/ML  SOLN COMPARISON:  PET-CT 11/18/2019. Chest CTA  11/04/2019. FINDINGS: CT CHEST FINDINGS Cardiovascular: The heart size is normal. No substantial pericardial effusion. Right Port-A-Cath tip is at the SVC/RA junction. Mediastinum/Nodes: AP window mass measures 4.0 x 3.1 cm today compared to 4.4 x 3.3 cm on 11/04/2019. This lesion was not well demonstrated on PET-CT performed without intravenous contrast. Left hilar lesion measured on previous CT at 2.6 x 2.5 cm now measures 1.2 x 0.9 cm. No right hilar lymphadenopathy. The esophagus has normal imaging features. There is no axillary lymphadenopathy. Lungs/Pleura: The left apical lung mass measured previously at 3.8 x 3.3 cm now measures 2.3 x 1.4 cm. Pleural metastatic lesion with scalloping of the adjacent posterior left third rib has also decreased. 8 mm medial right middle lobe (confirmed on sagittal imaging) nodule is identified on 54/3, new in the interval. 3 mm anterior subpleural nodule right lung on 54/3 is unchanged. 4 mm right lower lobe nodule on 70/3 is stable. No pleural effusion. No pulmonary edema. Centrilobular emphsyema noted. Musculoskeletal: No worrisome lytic or sclerotic osseous abnormality. CT ABDOMEN PELVIS FINDINGS Hepatobiliary: 11 mm low-density lesions identified in the dome of the left liver on 48/2. This was not definitely seen on previous PET-CT or CTA chest. No other focal abnormality within the liver parenchyma. There is no evidence for gallstones, gallbladder wall thickening, or pericholecystic fluid. No intrahepatic or extrahepatic biliary dilation. Pancreas: No focal mass lesion. No dilatation of the main duct. No intraparenchymal cyst. No peripancreatic edema. Spleen: No splenomegaly. No focal mass lesion. Adrenals/Urinary Tract: No adrenal nodule or mass. Kidneys unremarkable. No evidence for hydroureter. The urinary bladder appears normal for the degree of distention. Stomach/Bowel: Stomach is unremarkable. No gastric wall thickening. No evidence of outlet obstruction. Duodenum  is normally positioned as is the ligament of Treitz. No small bowel wall thickening. No small bowel dilatation. The terminal ileum is normal. Normal appendix visualized on coronal 72/4. No gross colonic mass. No colonic wall thickening. Vascular/Lymphatic: No abdominal aortic aneurysm. No abdominal lymphadenopathy No pelvic sidewall lymphadenopathy. Reproductive: The prostate gland and seminal vesicles are unremarkable. Other: No intraperitoneal free fluid. Musculoskeletal: No worrisome lytic or sclerotic osseous abnormality. IMPRESSION: 1. Interval decrease in size of the left apical lung mass. The pleural lesion, left hilar lymphadenopathy,  and AP window metastatic disease has also decreased since the prior study. 2. New 8 mm medial right middle lobe pulmonary nodule. This may be infectious/inflammatory but close follow-up recommended to exclude metastatic disease. Rapid interval appearance makes metachronous primary less likely. 3. 11 mm low-density lesion in the dome of the left liver, not definitely seen on previous PET-CT or CTA chest. Finding is indeterminate on today's study. Attention on follow-up recommended to ensure stability. If additional characterization is warranted at this time, MRI with and without contrast may prove helpful. Electronically Signed   By: Misty Stanley M.D.   On: 02/03/2020 11:07   CT Abdomen Pelvis W Contrast  Result Date: 02/03/2020 CLINICAL DATA:  Lung cancer. Restaging. EXAM: CT CHEST, ABDOMEN, AND PELVIS WITH CONTRAST TECHNIQUE: Multidetector CT imaging of the chest, abdomen and pelvis was performed following the standard protocol during bolus administration of intravenous contrast. CONTRAST:  144m OMNIPAQUE IOHEXOL 300 MG/ML  SOLN COMPARISON:  PET-CT 11/18/2019. Chest CTA 11/04/2019. FINDINGS: CT CHEST FINDINGS Cardiovascular: The heart size is normal. No substantial pericardial effusion. Right Port-A-Cath tip is at the SVC/RA junction. Mediastinum/Nodes: AP window mass  measures 4.0 x 3.1 cm today compared to 4.4 x 3.3 cm on 11/04/2019. This lesion was not well demonstrated on PET-CT performed without intravenous contrast. Left hilar lesion measured on previous CT at 2.6 x 2.5 cm now measures 1.2 x 0.9 cm. No right hilar lymphadenopathy. The esophagus has normal imaging features. There is no axillary lymphadenopathy. Lungs/Pleura: The left apical lung mass measured previously at 3.8 x 3.3 cm now measures 2.3 x 1.4 cm. Pleural metastatic lesion with scalloping of the adjacent posterior left third rib has also decreased. 8 mm medial right middle lobe (confirmed on sagittal imaging) nodule is identified on 54/3, new in the interval. 3 mm anterior subpleural nodule right lung on 54/3 is unchanged. 4 mm right lower lobe nodule on 70/3 is stable. No pleural effusion. No pulmonary edema. Centrilobular emphsyema noted. Musculoskeletal: No worrisome lytic or sclerotic osseous abnormality. CT ABDOMEN PELVIS FINDINGS Hepatobiliary: 11 mm low-density lesions identified in the dome of the left liver on 48/2. This was not definitely seen on previous PET-CT or CTA chest. No other focal abnormality within the liver parenchyma. There is no evidence for gallstones, gallbladder wall thickening, or pericholecystic fluid. No intrahepatic or extrahepatic biliary dilation. Pancreas: No focal mass lesion. No dilatation of the main duct. No intraparenchymal cyst. No peripancreatic edema. Spleen: No splenomegaly. No focal mass lesion. Adrenals/Urinary Tract: No adrenal nodule or mass. Kidneys unremarkable. No evidence for hydroureter. The urinary bladder appears normal for the degree of distention. Stomach/Bowel: Stomach is unremarkable. No gastric wall thickening. No evidence of outlet obstruction. Duodenum is normally positioned as is the ligament of Treitz. No small bowel wall thickening. No small bowel dilatation. The terminal ileum is normal. Normal appendix visualized on coronal 72/4. No gross colonic  mass. No colonic wall thickening. Vascular/Lymphatic: No abdominal aortic aneurysm. No abdominal lymphadenopathy No pelvic sidewall lymphadenopathy. Reproductive: The prostate gland and seminal vesicles are unremarkable. Other: No intraperitoneal free fluid. Musculoskeletal: No worrisome lytic or sclerotic osseous abnormality. IMPRESSION: 1. Interval decrease in size of the left apical lung mass. The pleural lesion, left hilar lymphadenopathy, and AP window metastatic disease has also decreased since the prior study. 2. New 8 mm medial right middle lobe pulmonary nodule. This may be infectious/inflammatory but close follow-up recommended to exclude metastatic disease. Rapid interval appearance makes metachronous primary less likely. 3. 11 mm low-density lesion  in the dome of the left liver, not definitely seen on previous PET-CT or CTA chest. Finding is indeterminate on today's study. Attention on follow-up recommended to ensure stability. If additional characterization is warranted at this time, MRI with and without contrast may prove helpful. Electronically Signed   By: Misty Stanley M.D.   On: 02/03/2020 11:07     Assessment and plan- Patient is a 49 y.o. male with Stage IVa non-small cell lung cancer cT2 cN2 cM1a. He is s/p concurrent chemoradiation with carbotaxol for 7 weeks and is here for on treatment assessment prior to cycle 1 of carbotaxol Keytruda  Patient's white cell count is 3.2 today with an ANC of 1.3.  Hemoglobin and platelets are within normal limits.  He will proceed with cycle 1 of carbotaxol Keytruda chemotherapy today with on for Neulasta support.  I will see him back in 3 weeks for cycle 2.  Plan to repeat scans again after 2 cycles.  Baseline TSH normal.  Again discussed risks and benefits of Keytruda including all but not limited to autoimmune side effect such as diarrhea, pneumonitis, endocrinopathies, need to monitor kidney and liver functions.  Treatment is being given with a  palliative intent.  Patient understands and agrees to proceed as planned  Abnormal LFTs: Currently resolved.  Chemo-induced neutropenia: Mild and he will be receiving on for Neulasta today.  I will be seeing him back in 3 weeks with CBC with differential, CMP for cycle 2 of carbotaxol Keytruda   Visit Diagnosis 1. Encounter for antineoplastic chemotherapy and immunotherapy   2. Abnormal LFTs   3. Chemotherapy induced neutropenia (HCC)   4. Malignant neoplasm of upper lobe of left lung ([place])      Dr. Randa Evens, MD, MPH [facility] [phone] 02/21/2020 9:36 AM

## 2020-02-26 LAB — T4: T4, Total: 5.9 ug/dL (ref 4.5–12.0)

## 2020-02-27 ENCOUNTER — Telehealth: Payer: Self-pay | Admitting: *Deleted

## 2020-02-27 NOTE — Telephone Encounter (Signed)
Pt called in to report that over the past few days he has noticed a generalized rash over his body. States that he changed his soap and his rash is better. Pt advised to continue to monitor but that rash may be related to treatment. Instructed pt to call back if rash worsens. Pt verbalized understanding.

## 2020-02-27 NOTE — Telephone Encounter (Signed)
He can take prn benadryl if he has intense itching. I can see him on Monday or tomorrow if need be

## 2020-02-28 ENCOUNTER — Ambulatory Visit: Payer: 59

## 2020-02-28 ENCOUNTER — Other Ambulatory Visit: Payer: 59

## 2020-03-05 ENCOUNTER — Ambulatory Visit
Admission: RE | Admit: 2020-03-05 | Discharge: 2020-03-05 | Disposition: A | Discharge status: Home / Self Care | Payer: 59 | Source: Ambulatory Visit | Attending: Radiation Oncology | Admitting: Radiation Oncology

## 2020-03-05 ENCOUNTER — Other Ambulatory Visit: Payer: Self-pay | Admitting: *Deleted

## 2020-03-05 ENCOUNTER — Encounter: Payer: Self-pay | Admitting: Radiation Oncology

## 2020-03-05 VITALS — BP 126/82 | HR 96 | Temp 96.8°F | Resp 16 | Wt 180.4 lb

## 2020-03-05 DIAGNOSIS — C778 Secondary and unspecified malignant neoplasm of lymph nodes of multiple regions: Secondary | ICD-10-CM | POA: Insufficient documentation

## 2020-03-05 DIAGNOSIS — Z923 Personal history of irradiation: Secondary | ICD-10-CM | POA: Diagnosis not present

## 2020-03-05 DIAGNOSIS — C3412 Malignant neoplasm of upper lobe, left bronchus or lung: Secondary | ICD-10-CM | POA: Diagnosis present

## 2020-03-05 DIAGNOSIS — Z9221 Personal history of antineoplastic chemotherapy: Secondary | ICD-10-CM | POA: Diagnosis not present

## 2020-03-05 DIAGNOSIS — R21 Rash and other nonspecific skin eruption: Secondary | ICD-10-CM | POA: Diagnosis not present

## 2020-03-05 DIAGNOSIS — Z87891 Personal history of nicotine dependence: Secondary | ICD-10-CM | POA: Diagnosis not present

## 2020-03-05 NOTE — Progress Notes (Signed)
Radiation Oncology Follow up Note  Name: Kyle Guerrero   Date:   03/05/2020 MRN:  [id number] DOB: [birth year]    This 49 y.o. male presents to the clinic today for 1 month follow-up status post concurrent chemoradiation therapy for early stage IIIb (T3 N2 M0) non-small cell lung cancer of the left upper lobe.Marland Kitchen  REFERRING PROVIDER: No ref. provider found  HPI: Patient is a 49 year old male now out 1 month having completed concurrent chemoradiation therapy for stage IIIb non-small cell lung cancer left upper lobe. Seen today in routine follow-up he is doing quite well. He specifically denies dysphagia cough hemoptysis or chest tightness.Marland Kitchen He is currently on carbotaxol Keytruda therapy. He developed a rash which has cleared was treated conservatively with Benadryl.  COMPLICATIONS OF TREATMENT: none  FOLLOW UP COMPLIANCE: keeps appointments   PHYSICAL EXAM:  BP 126/82   Pulse 96   Temp (!) 96.8 F (36 C)   Resp 16   Wt 180 lb 6.4 oz (81.8 kg)   SpO2 99%   BMI 25.16 kg/m  Well-developed well-nourished patient in NAD. HEENT reveals PERLA, EOMI, discs not visualized.  Oral cavity is clear. No oral mucosal lesions are identified. Neck is clear without evidence of cervical or supraclavicular adenopathy. Lungs are clear to A&P. Cardiac examination is essentially unremarkable with regular rate and rhythm without murmur rub or thrill. Abdomen is benign with no organomegaly or masses noted. Motor sensory and DTR levels are equal and symmetric in the upper and lower extremities. Cranial nerves II through XII are grossly intact. Proprioception is intact. No peripheral adenopathy or edema is identified. No motor or sensory levels are noted. Crude visual fields are within normal range.  RADIOLOGY RESULTS: No current films for review  PLAN: Present time patient is doing well he is on maintenance chemotherapy and Keytruda. I have asked to see him back in 3 months for follow-up and of ordered a CT scan  at that time. Patient continues close follow-up care and treatment medical oncology. Patient knows to call with any concerns.  I would like to take this opportunity to thank you for allowing me to participate in the care of your patient.Noreene Filbert, MD

## 2020-03-13 ENCOUNTER — Inpatient Hospital Stay: Payer: 59 | Admitting: Oncology

## 2020-03-13 ENCOUNTER — Other Ambulatory Visit: Payer: Self-pay

## 2020-03-13 ENCOUNTER — Encounter: Payer: Self-pay | Admitting: Oncology

## 2020-03-13 ENCOUNTER — Inpatient Hospital Stay: Payer: 59 | Attending: Oncology

## 2020-03-13 ENCOUNTER — Inpatient Hospital Stay: Payer: 59

## 2020-03-13 VITALS — BP 109/76 | HR 84 | Temp 95.8°F | Resp 16 | Wt 179.5 lb

## 2020-03-13 DIAGNOSIS — R945 Abnormal results of liver function studies: Secondary | ICD-10-CM | POA: Diagnosis not present

## 2020-03-13 DIAGNOSIS — R21 Rash and other nonspecific skin eruption: Secondary | ICD-10-CM | POA: Insufficient documentation

## 2020-03-13 DIAGNOSIS — R7989 Other specified abnormal findings of blood chemistry: Secondary | ICD-10-CM

## 2020-03-13 DIAGNOSIS — C3412 Malignant neoplasm of upper lobe, left bronchus or lung: Secondary | ICD-10-CM | POA: Diagnosis present

## 2020-03-13 DIAGNOSIS — C782 Secondary malignant neoplasm of pleura: Secondary | ICD-10-CM | POA: Insufficient documentation

## 2020-03-13 DIAGNOSIS — C778 Secondary and unspecified malignant neoplasm of lymph nodes of multiple regions: Secondary | ICD-10-CM | POA: Insufficient documentation

## 2020-03-13 DIAGNOSIS — D709 Neutropenia, unspecified: Secondary | ICD-10-CM | POA: Insufficient documentation

## 2020-03-13 DIAGNOSIS — D701 Agranulocytosis secondary to cancer chemotherapy: Secondary | ICD-10-CM

## 2020-03-13 DIAGNOSIS — T451X5A Adverse effect of antineoplastic and immunosuppressive drugs, initial encounter: Secondary | ICD-10-CM

## 2020-03-13 DIAGNOSIS — Z95828 Presence of other vascular implants and grafts: Secondary | ICD-10-CM

## 2020-03-13 DIAGNOSIS — Z7689 Persons encountering health services in other specified circumstances: Secondary | ICD-10-CM | POA: Insufficient documentation

## 2020-03-13 DIAGNOSIS — Z5111 Encounter for antineoplastic chemotherapy: Secondary | ICD-10-CM

## 2020-03-13 DIAGNOSIS — Z87891 Personal history of nicotine dependence: Secondary | ICD-10-CM | POA: Insufficient documentation

## 2020-03-13 LAB — CBC WITH DIFFERENTIAL/PLATELET
Abs Immature Granulocytes: 0.02 10*3/uL (ref 0.00–0.07)
Basophils Absolute: 0 10*3/uL (ref 0.0–0.1)
Basophils Relative: 1 %
Eosinophils Absolute: 0.2 10*3/uL (ref 0.0–0.5)
Eosinophils Relative: 4 %
HCT: 35.3 % — ABNORMAL LOW (ref 39.0–52.0)
Hemoglobin: 11.5 g/dL — ABNORMAL LOW (ref 13.0–17.0)
Immature Granulocytes: 1 %
Lymphocytes Relative: 37 %
Lymphs Abs: 1.4 10*3/uL (ref 0.7–4.0)
MCH: 30 pg (ref 26.0–34.0)
MCHC: 32.6 g/dL (ref 30.0–36.0)
MCV: 92.2 fL (ref 80.0–100.0)
Monocytes Absolute: 0.7 10*3/uL (ref 0.1–1.0)
Monocytes Relative: 20 %
Neutro Abs: 1.5 10*3/uL — ABNORMAL LOW (ref 1.7–7.7)
Neutrophils Relative %: 37 %
Platelets: 203 10*3/uL (ref 150–400)
RBC: 3.83 MIL/uL — ABNORMAL LOW (ref 4.22–5.81)
RDW: 17.4 % — ABNORMAL HIGH (ref 11.5–15.5)
WBC: 3.8 10*3/uL — ABNORMAL LOW (ref 4.0–10.5)
nRBC: 0 % (ref 0.0–0.2)

## 2020-03-13 LAB — COMPREHENSIVE METABOLIC PANEL
ALT: 60 U/L — ABNORMAL HIGH (ref 0–44)
AST: 37 U/L (ref 15–41)
Albumin: 4 g/dL (ref 3.5–5.0)
Alkaline Phosphatase: 105 U/L (ref 38–126)
Anion gap: 9 (ref 5–15)
BUN: 17 mg/dL (ref 6–20)
CO2: 23 mmol/L (ref 22–32)
Calcium: 9 mg/dL (ref 8.9–10.3)
Chloride: 104 mmol/L (ref 98–111)
Creatinine, Ser: 0.97 mg/dL (ref 0.61–1.24)
GFR calc Af Amer: >60 mL/min (ref >60)
GFR calc non Af Amer: >60 mL/min (ref >60)
Glucose, Bld: 123 mg/dL — ABNORMAL HIGH (ref 70–99)
Potassium: 3.6 mmol/L (ref 3.5–5.1)
Sodium: 136 mmol/L (ref 135–145)
Total Bilirubin: 0.9 mg/dL (ref 0.3–1.2)
Total Protein: 7 g/dL (ref 6.5–8.1)

## 2020-03-13 MED ORDER — SODIUM CHLORIDE 0.9 % IV SOLN
670.0000 mg | Freq: Once | INTRAVENOUS | Status: AC
  Administered 2020-03-13: 670 mg via INTRAVENOUS
  Filled 2020-03-13: qty 67

## 2020-03-13 MED ORDER — FAMOTIDINE IN NACL 20-0.9 MG/50ML-% IV SOLN
20.0000 mg | Freq: Once | INTRAVENOUS | Status: AC
  Administered 2020-03-13: 20 mg via INTRAVENOUS
  Filled 2020-03-13: qty 50

## 2020-03-13 MED ORDER — SODIUM CHLORIDE 0.9 % IV SOLN
200.0000 mg | Freq: Once | INTRAVENOUS | Status: AC
  Administered 2020-03-13: 200 mg via INTRAVENOUS
  Filled 2020-03-13: qty 8

## 2020-03-13 MED ORDER — SODIUM CHLORIDE 0.9 % IV SOLN
175.0000 mg/m2 | Freq: Once | INTRAVENOUS | Status: AC
  Administered 2020-03-13: 342 mg via INTRAVENOUS
  Filled 2020-03-13: qty 57

## 2020-03-13 MED ORDER — PEGFILGRASTIM 6 MG/0.6ML ~~LOC~~ PSKT
6.0000 mg | PREFILLED_SYRINGE | Freq: Once | SUBCUTANEOUS | Status: AC
  Administered 2020-03-13: 6 mg via SUBCUTANEOUS
  Filled 2020-03-13: qty 0.6

## 2020-03-13 MED ORDER — SODIUM CHLORIDE 0.9 % IV SOLN
10.0000 mg | Freq: Once | INTRAVENOUS | Status: AC
  Administered 2020-03-13: 10 mg via INTRAVENOUS
  Filled 2020-03-13: qty 10

## 2020-03-13 MED ORDER — PALONOSETRON HCL INJECTION 0.25 MG/5ML
0.2500 mg | Freq: Once | INTRAVENOUS | Status: AC
  Administered 2020-03-13: 0.25 mg via INTRAVENOUS
  Filled 2020-03-13: qty 5

## 2020-03-13 MED ORDER — SODIUM CHLORIDE 0.9 % IV SOLN
150.0000 mg | Freq: Once | INTRAVENOUS | Status: AC
  Administered 2020-03-13: 150 mg via INTRAVENOUS
  Filled 2020-03-13: qty 150

## 2020-03-13 MED ORDER — SODIUM CHLORIDE 0.9 % IV SOLN
Freq: Once | INTRAVENOUS | Status: AC
  Filled 2020-03-13: qty 250

## 2020-03-13 MED ORDER — HEPARIN SOD (PORK) LOCK FLUSH 100 UNIT/ML IV SOLN
500.0000 [IU] | Freq: Once | INTRAVENOUS | Status: AC | PRN
  Administered 2020-03-13: 500 [IU]
  Filled 2020-03-13: qty 5

## 2020-03-13 MED ORDER — SODIUM CHLORIDE 0.9% FLUSH
10.0000 mL | Freq: Once | INTRAVENOUS | Status: AC
  Administered 2020-03-13: 10 mL via INTRAVENOUS
  Filled 2020-03-13: qty 10

## 2020-03-13 MED ORDER — DIPHENHYDRAMINE HCL 50 MG/ML IJ SOLN
50.0000 mg | Freq: Once | INTRAMUSCULAR | Status: AC
  Administered 2020-03-13: 50 mg via INTRAVENOUS
  Filled 2020-03-13: qty 1

## 2020-03-13 NOTE — Addendum Note (Signed)
Addended by: Luella Cook on: 03/13/2020 09:13 AM   Modules accepted: Orders

## 2020-03-13 NOTE — Progress Notes (Signed)
Hematology/Oncology Consult note [facility]  [phone] [phone]  Patient Care Team: Patient, No Pcp Per as PCP - General ([place]) Telford Nab, RN as Registered Nurse Sindy Guadeloupe, MD as Consulting Physician (Hematology and Oncology) Sindy Guadeloupe, MD as Consulting Physician (Hematology and Oncology)   Name of the patient: Kyle Guerrero  [id number]  May 31, 1971   Date of visit: 03/13/20  Diagnosis- Non-small cell lung cancer stage IV acT2 cN2 cM1 a with pleural involvement  Chief complaint/ Reason for visit-on treatment assessment prior to cycle 2 of carbotaxol Keytruda chemotherapy  Heme/Onc history: patient is a 49 year old male who presented to the ER with symptoms ofheaviness in his chest and upper left chest discomfort he underwent CT angio chest to rule out PE which showed 3.8 x 3.3 cm left upper palpable lung mass along with 4.4 x 3.3 cm lobulated mass in the aortopulmonary window and 2.6 x 2.4 cm left hilar mass all concerning for malignancy. Patient has also seen pulmonary and has been set up for bronchoscopy and EBUS guided biopsy on 11/23/2019. Patient underwent. PET CT scan which showed a hypermetabolic spiculated 3.5 cm of 5 left upper lobe lung mass, adjacent hypermetabolic 3.2 x 1.2 cm pleural metastases in the medial posterior by the left pleural space along with scalloping of the adjacent posterior left third rib. Hypermetabolic infiltrative left perihilar conglomerate nodal metastases measuring up to 7.3 x 3.6 cm and 0.8 cm high left mediastinal node between the left brachiocephalic vein and left subclavian artery. No evidence of distant metastatic disease  Biopsy showed non-small cell lung cancer but further characterization could not be determined. Insufficient tissue for NGS testing. Repeat biopsy done. Results of NGStestingshowed PD-L1 50%. Tumor mutational burden high. 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  Interval history-patient reports having a skin rash for 2 to 3 days after chemo which caused intense itching.  Presently the rash is resolved and he does not have any lesions to show.  Otherwise he is tolerating treatment well without any significant nausea or vomiting.  Energy levels are stable  ECOG PS- 1 Pain scale- 0 Opioid associated constipation- no  Review of systems- Review of Systems  Constitutional: Positive for malaise/fatigue. Negative for chills, fever and weight loss.  HENT: Negative for congestion, ear discharge and nosebleeds.   Eyes: Negative for blurred vision.  Respiratory: Negative for cough, hemoptysis, sputum production, shortness of breath and wheezing.   Cardiovascular: Negative for chest pain, palpitations, orthopnea and claudication.  Gastrointestinal: Negative for abdominal pain, blood in stool, constipation, diarrhea, heartburn, melena, nausea and vomiting.  Genitourinary: Negative for dysuria, flank pain, frequency, hematuria and urgency.  Musculoskeletal: Negative for back pain, joint pain and myalgias.  Skin: Positive for rash.  Neurological: Negative for dizziness, tingling, focal weakness, seizures, weakness and headaches.  Endo/Heme/Allergies: Does not bruise/bleed easily.  Psychiatric/Behavioral: Negative for depression and suicidal ideas. The patient does not have insomnia.        No Known Allergies   Past Medical History:  Diagnosis Date  . Asthma   . Cancer [facility])      Past Surgical History:  Procedure Laterality Date  . CYST EXCISION    . IR IMAGING GUIDED PORT INSERTION  12/05/2019  . VIDEO BRONCHOSCOPY WITH ENDOBRONCHIAL ULTRASOUND Left 11/22/2019   Procedure: VIDEO BRONCHOSCOPY WITH ENDOBRONCHIAL ULTRASOUND;  Surgeon: Tyler Pita, MD;  Location: [facility] ORS;  Service: Thoracic;  Laterality: Left;  Social History   Socioeconomic History  . Marital status: Single     Spouse name: Not on file  . Number of children: Not on file  . Years of education: Not on file  . Highest education level: Not on file  Occupational History  . Not on file  Tobacco Use  . Smoking status: Former Smoker    Packs/day: 2.00    Types: Cigarettes    Quit date: 11/08/2019    Years since quitting: 0.3  . Smokeless tobacco: Never Used  Substance and Sexual Activity  . Alcohol use: Not Currently  . Drug use: Yes    Types: Marijuana  . Sexual activity: Not on file  Other Topics Concern  . Not on file  Social History Narrative  . Not on file   Social Determinants of Health   Financial Resource Strain:   . Difficulty of Paying Living Expenses:   Food Insecurity:   . Worried About Charity fundraiser in the Last Year:   . Arboriculturist in the Last Year:   Transportation Needs:   . Film/video editor (Medical):   Marland Kitchen Lack of Transportation (Non-Medical):   Physical Activity:   . Days of Exercise per Week:   . Minutes of Exercise per Session:   Stress:   . Feeling of Stress :   Social Connections:   . Frequency of Communication with Friends and Family:   . Frequency of Social Gatherings with Friends and Family:   . Attends Religious Services:   . Active Member of Clubs or Organizations:   . Attends Archivist Meetings:   Marland Kitchen Marital Status:   Intimate Partner Violence:   . Fear of Current or Ex-Partner:   . Emotionally Abused:   Marland Kitchen Physically Abused:   . Sexually Abused:     Family History  Problem Relation Age of Onset  . Healthy Mother   . Healthy Father      Current Outpatient Medications:  .  azelastine (ASTELIN) 0.1 % nasal spray, Place 1 spray into the nose as needed. , Disp: , Rfl:  .  sucralfate (CARAFATE) 1 g tablet, Take 1 tablet (1 g total) by mouth 3 (three) times daily before meals. Dissolve in warm water, swish and swallow (Patient taking differently: Take 1 g by mouth 4 (four) times daily as needed. Dissolve in warm water,  swish and swallow), Disp: 90 tablet, Rfl: 6  Physical exam:  Vitals:   03/13/20 0839 03/13/20 0842  BP:  109/76  Pulse:  84  Resp:  16  Temp: (!) 95.8 F (35.4 C)   TempSrc: Tympanic   Weight: 179 lb 8 oz (81.4 kg)    Physical Exam HENT:     Head: Normocephalic and atraumatic.  Eyes:     Pupils: Pupils are equal, round, and reactive to light.  Cardiovascular:     Rate and Rhythm: Regular rhythm. Tachycardia present.     Heart sounds: Normal heart sounds.  Pulmonary:     Effort: Pulmonary effort is normal.     Breath sounds: Normal breath sounds.  Abdominal:     General: Bowel sounds are normal.     Palpations: Abdomen is soft.  Musculoskeletal:     Cervical back: Normal range of motion.  Skin:    General: Skin is warm and dry.  Neurological:     Mental Status: He is alert and oriented to person, place, and time.      CMP Latest Ref  Rng & Units 03/13/2020  Glucose 70 - 99 mg/dL 123(H)  BUN 6 - 20 mg/dL 17  Creatinine 0.61 - 1.24 mg/dL 0.97  Sodium 135 - 145 mmol/L 136  Potassium 3.5 - 5.1 mmol/L 3.6  Chloride 98 - 111 mmol/L 104  CO2 22 - 32 mmol/L 23  Calcium 8.9 - 10.3 mg/dL 9.0  Total Protein 6.5 - 8.1 g/dL 7.0  Total Bilirubin 0.3 - 1.2 mg/dL 0.9  Alkaline Phos 38 - 126 U/L 105  AST 15 - 41 U/L 37  ALT 0 - 44 U/L 60(H)   CBC Latest Ref Rng & Units 03/13/2020  WBC 4.0 - 10.5 K/uL 3.8(L)  Hemoglobin 13.0 - 17.0 g/dL 11.5(L)  Hematocrit 39.0 - 52.0 % 35.3(L)  Platelets 150 - 400 K/uL 203    No images are attached to the encounter.  No results found.   Assessment and plan- Patient is a 49 y.o. male with StageIVa non-small cell lung cancer cT2 cN2 cM1a. He is s/p concurrent chemoradiation with carbotaxol for 7 weeks.  He is here for on treatment assessment prior to cycle 2 of carbotaxol Keytruda  Patient has ongoing neutropenia which has remained stable.  His white cell count is 3.8 today with an ANC of 1.5.  Counts are okay to proceed with cycle 2 of  carbotaxol Keytruda with on for Neulasta support today.  I will repeat CT chest abdomen and pelvis with contrast in 2 weeks.  I will see him back in 3 weeks time with CBC with differential, CMP for carbotaxol Keytruda.  Depending on his CT scan results and he has an excellent response to treatment, I will consider dropping carbotaxol as he would have roughly received 4 cycles with this treatment.  Abnormal LFTs: They have been waxing and waning.  Possibly secondary to Taxol.  ALT mildly elevated at 60 today.  Continue to monitor  Skin rash: Possibly secondary to [facility].  He does not have any significant rash to show me today but he had it for a few days after treatment.  I have asked the patient to take pictures of his skin rash when he gets it and also have as needed Benadryl handy if he has intense itching from the rash   Visit Diagnosis 1. Malignant neoplasm of upper lobe of left lung ([place])   2. Encounter for antineoplastic chemotherapy   3. Chemotherapy induced neutropenia (H[facility])   4. Abnormal LFTs      Dr. Randa Evens, MD, MPH [facility] at [facility] [phone] 03/13/2020 9:01 AM

## 2020-03-13 NOTE — Progress Notes (Signed)
Pt states that he had a rash after chemo and went home and took allergy med. Then got in shower and it went away but had itchy skin all over. He says the itchiness is better after 5 day

## 2020-03-13 NOTE — Progress Notes (Signed)
Carboplatin dose adjusted to 670mg  per MD based on new SCr and weight.

## 2020-03-24 ENCOUNTER — Other Ambulatory Visit: Payer: Self-pay

## 2020-03-24 ENCOUNTER — Ambulatory Visit
Admission: RE | Admit: 2020-03-24 | Discharge: 2020-03-24 | Disposition: A | Discharge status: Home / Self Care | Payer: 59 | Source: Ambulatory Visit | Attending: Oncology | Admitting: Oncology

## 2020-03-24 DIAGNOSIS — C3412 Malignant neoplasm of upper lobe, left bronchus or lung: Secondary | ICD-10-CM | POA: Diagnosis present

## 2020-03-24 IMAGING — CT CT ABDOMEN W/ CM
4 of 10 series · 12 of 46 positions shown, 17 images · IV contrast (omnipaque)
Comparison: [DATE]

CLINICAL DATA: Restaging lung cancer.

EXAM:
CT CHEST AND ABDOMEN WITH CONTRAST
TECHNIQUE: Multidetector CT imaging of the chest and abdomen was performed
following the standard protocol during bolus administration of
intravenous contrast.
CONTRAST:  100mL OMNIPAQUE IOHEXOL 300 MG/ML  SOLN

[Series 2: axials cap 5.00 · axial · 0.74mm/px · z∈[-1204,-984]mm · 3 of 89 slices shown (1 of 2)]
[im 23/89  soft-tissue]
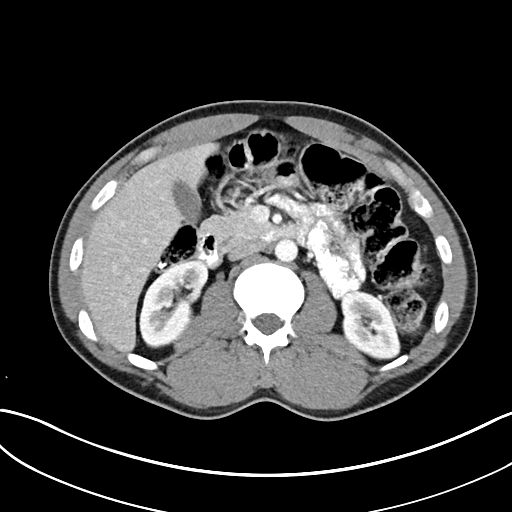
[im 45/89  soft-tissue]
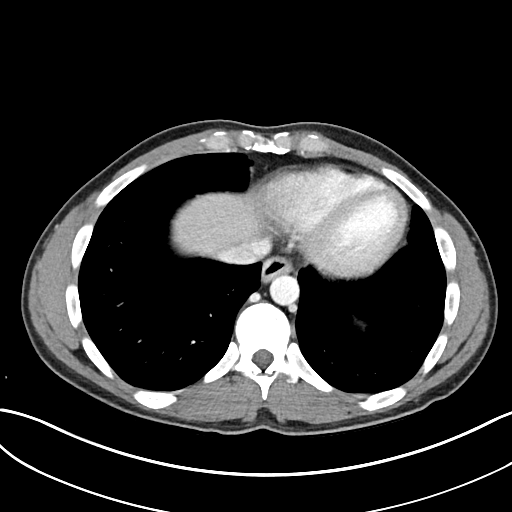
[im 67/89  soft-tissue]
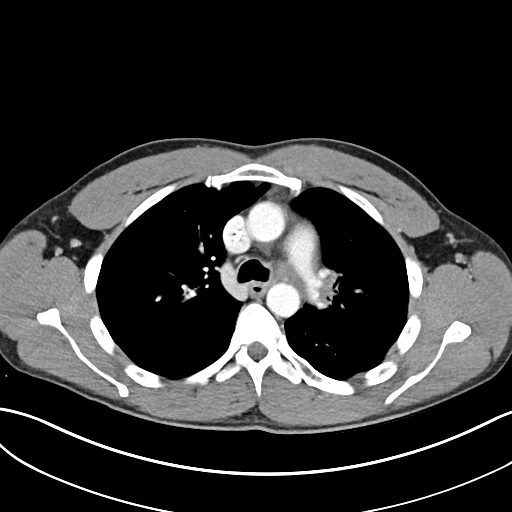

[Series 2: axials cap 5.00 · axial · 0.74mm/px · z∈[-1204,-984]mm · 3 of 89 slices shown, 7 images (2 of 2)]
[im 23/89  soft-tissue]
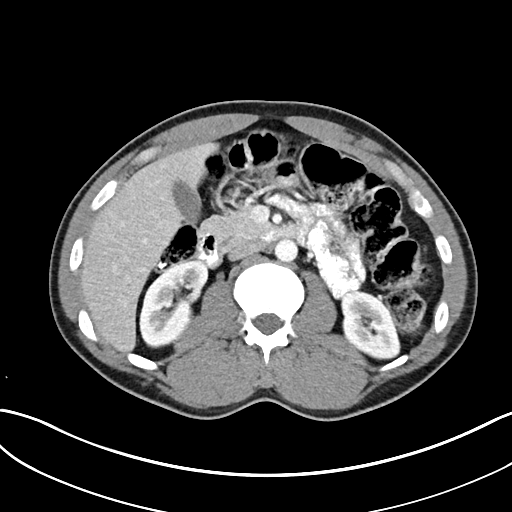
[im 23/89  lung]
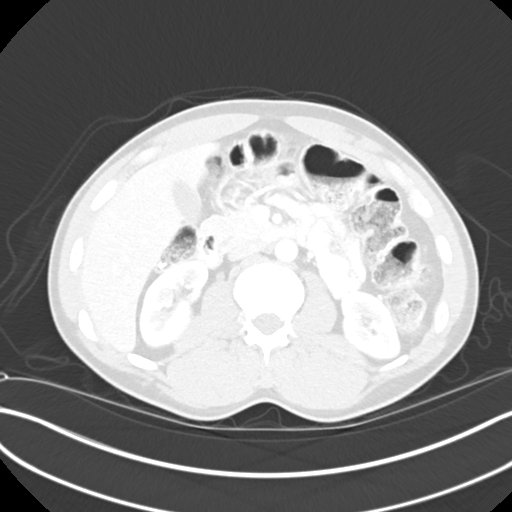
[im 23/89  bone]
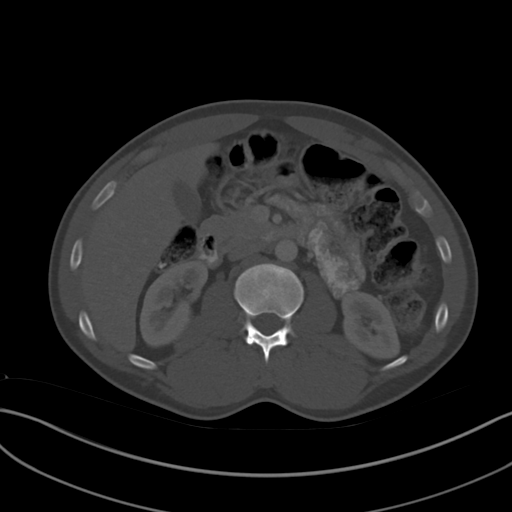
[im 45/89  soft-tissue]
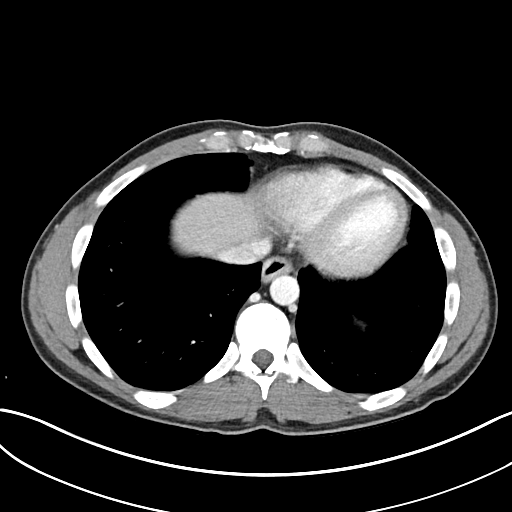
[im 45/89  lung]
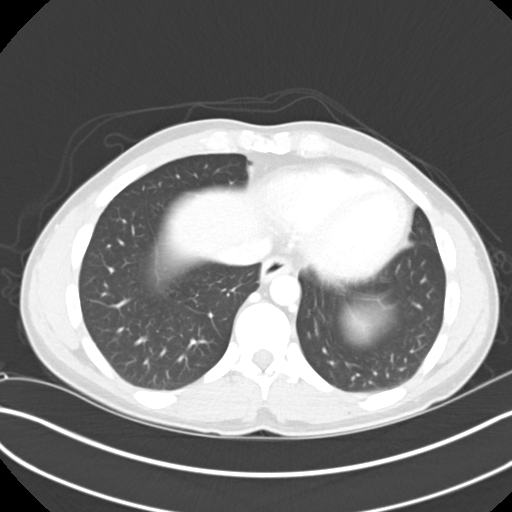
[im 67/89  soft-tissue]
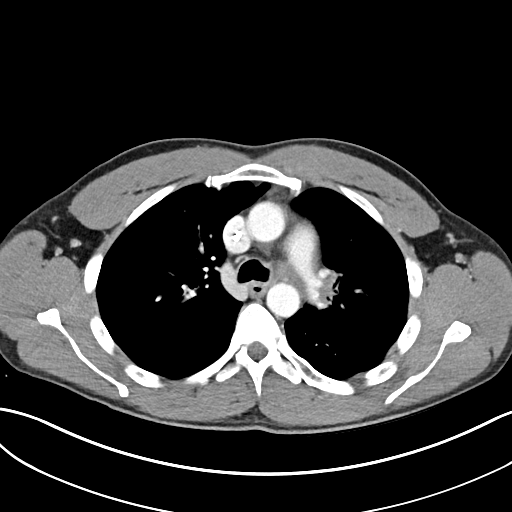
[im 67/89  lung]
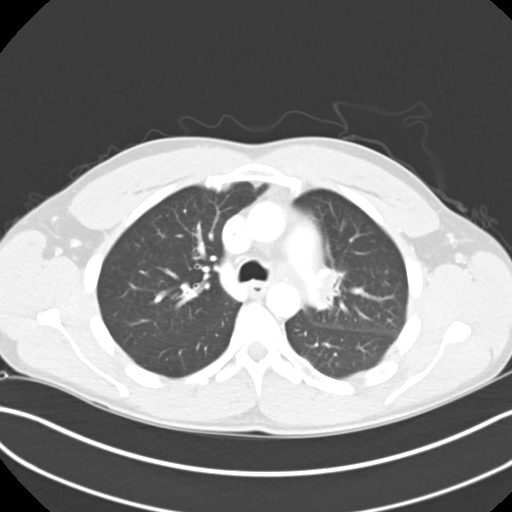

[Series 3: lungs cap 2.00 · axial · 0.74mm/px · z∈[-1119,-1049]mm · 3 of 141 slices shown]
[im 18/141  soft-tissue]
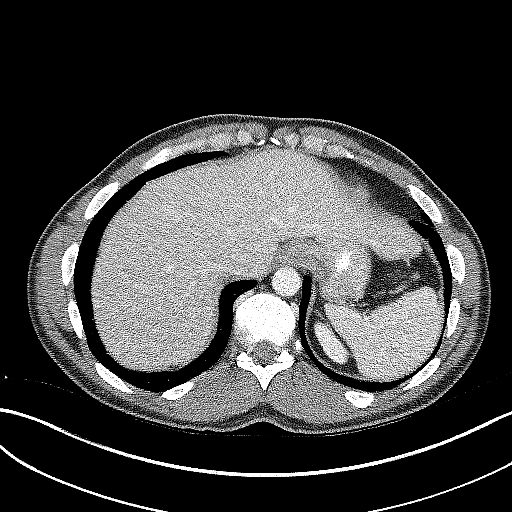
[im 36/141  soft-tissue]
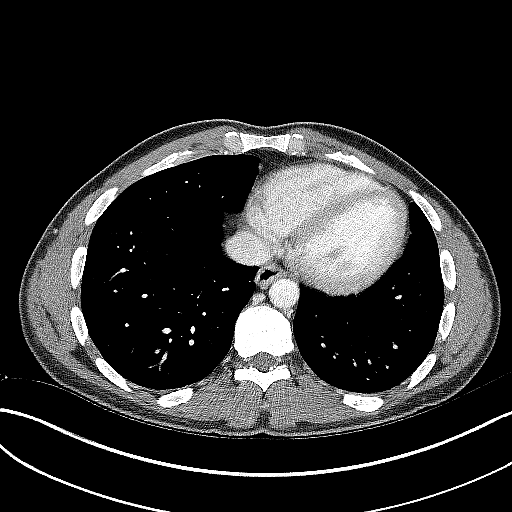
[im 53/141  soft-tissue]
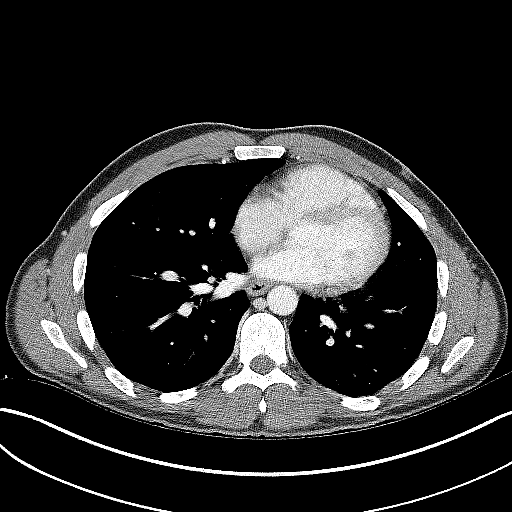

[Series 4: coronals cap 2.00 cor · coronal · 0.74mm/px · 3 of 126 slices shown, 4 images]
[im 32/126  soft-tissue]
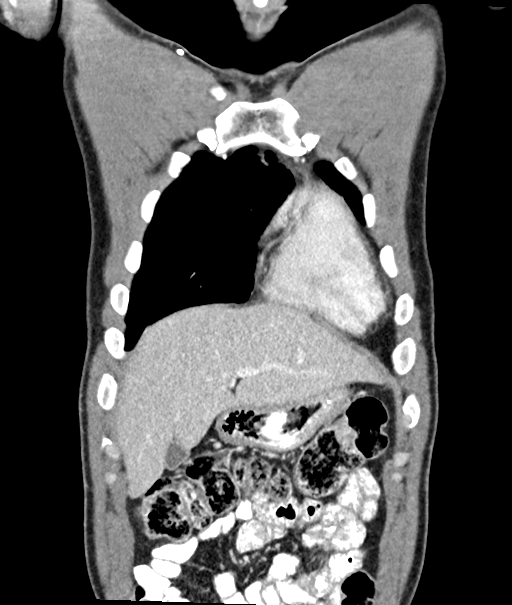
[im 63/126  soft-tissue]
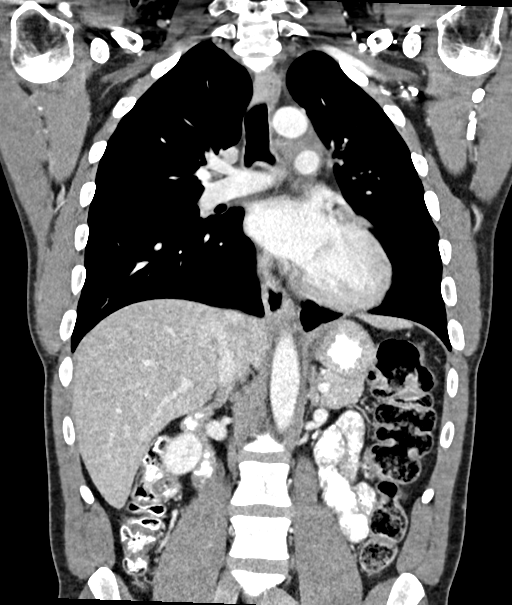
[im 63/126  bone]
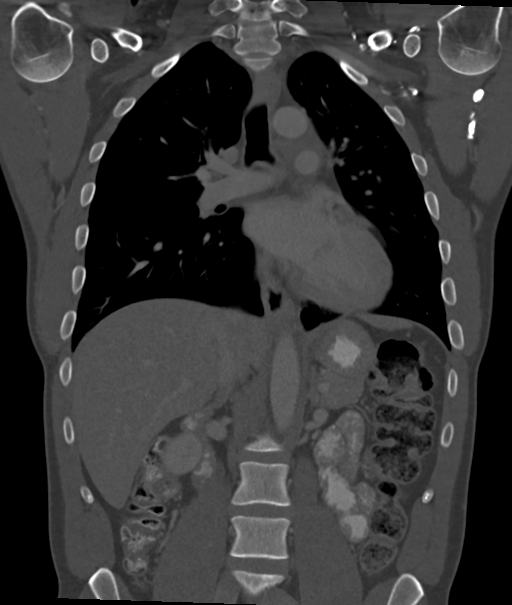
[im 94/126  soft-tissue]
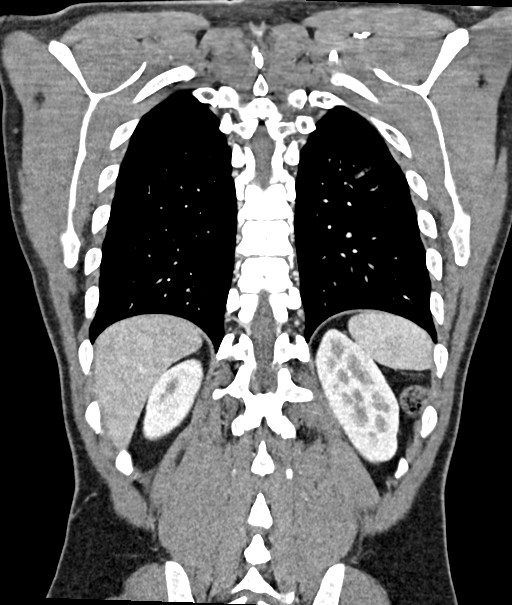

[12 of 46 positions shown; findings below may reference images not displayed]

FINDINGS: CT CHEST FINDINGS

Cardiovascular: The heart is normal in size. No pericardial
effusion. The aorta is normal in caliber. No dissection. No
atherosclerotic calcifications. The branch vessels are patent. No
definite coronary artery calcifications.

Mediastinum/Nodes: Persistent soft tissue density in the left hilum
and AP window measuring approximately 30 x 28.5 mm. This previously
measured approximately 39 x 31 mm.

Adjacent left hilar node measures 11 mm and previously measured 12
mm. No subcarinal or contralateral adenopathy. The esophagus is
grossly normal.

Lungs/Pleura: Left upper lobe pulmonary lesion measures 30 x 19.5 x
14 mm. This previously measured 34 x 23 x 14.5 mm.

New patchy sub solid nodular density in the superior segment of the
left lower lobe on image 63/3 is most likely inflammatory change.
Attention on future scans is suggested.

Vague nodularity in the right middle lobe medially is stable and
appears inflammatory or atelectatic. Attention on future scans is
suggested. No findings for pulmonary metastatic disease. No pleural
effusions.

Musculoskeletal: No chest wall mass, supraclavicular or axillary
adenopathy. The thyroid gland is normal. The right sided Port-A-Cath
is stable.

CT ABDOMEN FINDINGS

Hepatobiliary: Stable 8 mm low-attenuation lesion at the left
hepatic dome, likely benign cyst. No worrisome hepatic lesions or
intrahepatic biliary dilatation. The gallbladder appears normal. No
common bile duct dilatation.

Pancreas: No mass, inflammation or ductal dilatation.

Spleen: Normal size. No focal lesions.

Adrenals/Urinary Tract: The adrenal glands and kidneys are
unremarkable.

Stomach/Bowel: The stomach, duodenum, visualized small bowel and
visualize colon are unremarkable.

Vascular/Lymphatic: The aorta and branch vessels are normal. The
major venous structures are patent. No upper abdominal adenopathy.

Other: No ascites or abdominal wall hernia.

Musculoskeletal: No significant bony findings.
IMPRESSION: 1. Slight interval decrease in size of the left upper lobe pulmonary
lesion.
2. Slight interval decrease in size of the left hilar and AP window
nodal disease.
3. No change in vague right middle lobe nodular density.
4. New sub solid nodular density in the superior segment the left
lower lobe, likely inflammatory. Recommend attention on future
scans.
5. No findings for upper abdominal metastatic disease.

## 2020-03-24 IMAGING — CT CT CHEST W/ CM
1 series · 1 of 1 positions shown · IV contrast (omnipaque)
Comparison: [DATE]

CLINICAL DATA: Restaging lung cancer.

EXAM:
CT CHEST AND ABDOMEN WITH CONTRAST
TECHNIQUE: Multidetector CT imaging of the chest and abdomen was performed
following the standard protocol during bolus administration of
intravenous contrast.
CONTRAST:  100mL OMNIPAQUE IOHEXOL 300 MG/ML  SOLN

[Series 1: topogram 0.60 cor · coronal · 1.50mm/px · 1 of 1 slices shown]
[im 1/1]
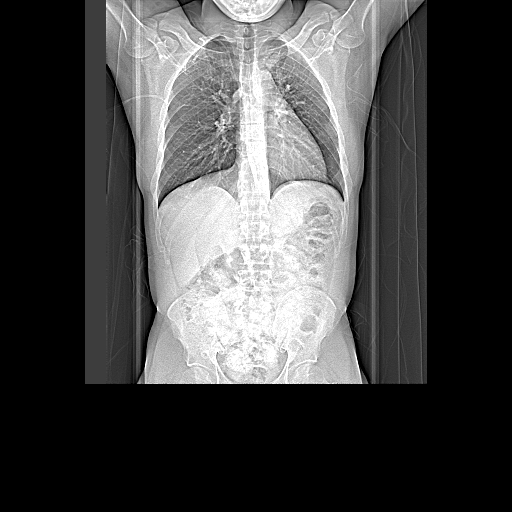

[1 of 1 positions shown; findings below may reference images not displayed]

FINDINGS: CT CHEST FINDINGS

Cardiovascular: The heart is normal in size. No pericardial
effusion. The aorta is normal in caliber. No dissection. No
atherosclerotic calcifications. The branch vessels are patent. No
definite coronary artery calcifications.

Mediastinum/Nodes: Persistent soft tissue density in the left hilum
and AP window measuring approximately 30 x 28.5 mm. This previously
measured approximately 39 x 31 mm.

Adjacent left hilar node measures 11 mm and previously measured 12
mm. No subcarinal or contralateral adenopathy. The esophagus is
grossly normal.

Lungs/Pleura: Left upper lobe pulmonary lesion measures 30 x 19.5 x
14 mm. This previously measured 34 x 23 x 14.5 mm.

New patchy sub solid nodular density in the superior segment of the
left lower lobe on image 63/3 is most likely inflammatory change.
Attention on future scans is suggested.

Vague nodularity in the right middle lobe medially is stable and
appears inflammatory or atelectatic. Attention on future scans is
suggested. No findings for pulmonary metastatic disease. No pleural
effusions.

Musculoskeletal: No chest wall mass, supraclavicular or axillary
adenopathy. The thyroid gland is normal. The right sided Port-A-Cath
is stable.

CT ABDOMEN FINDINGS

Hepatobiliary: Stable 8 mm low-attenuation lesion at the left
hepatic dome, likely benign cyst. No worrisome hepatic lesions or
intrahepatic biliary dilatation. The gallbladder appears normal. No
common bile duct dilatation.

Pancreas: No mass, inflammation or ductal dilatation.

Spleen: Normal size. No focal lesions.

Adrenals/Urinary Tract: The adrenal glands and kidneys are
unremarkable.

Stomach/Bowel: The stomach, duodenum, visualized small bowel and
visualize colon are unremarkable.

Vascular/Lymphatic: The aorta and branch vessels are normal. The
major venous structures are patent. No upper abdominal adenopathy.

Other: No ascites or abdominal wall hernia.

Musculoskeletal: No significant bony findings.
IMPRESSION: 1. Slight interval decrease in size of the left upper lobe pulmonary
lesion.
2. Slight interval decrease in size of the left hilar and AP window
nodal disease.
3. No change in vague right middle lobe nodular density.
4. New sub solid nodular density in the superior segment the left
lower lobe, likely inflammatory. Recommend attention on future
scans.
5. No findings for upper abdominal metastatic disease.

## 2020-03-24 MED ORDER — IOHEXOL 300 MG/ML  SOLN
100.0000 mL | Freq: Once | INTRAMUSCULAR | Status: AC | PRN
  Administered 2020-03-24: 100 mL via INTRAVENOUS

## 2020-03-26 NOTE — Progress Notes (Signed)
Pharmacist Chemotherapy Monitoring - Follow Up Assessment    I verify that I have reviewed each item in the below checklist:  . Regimen for the patient is scheduled for the appropriate day and plan matches scheduled date. Marland Kitchen Appropriate non-routine labs are ordered dependent on drug ordered. . If applicable, additional medications reviewed and ordered per protocol based on lifetime cumulative doses and/or treatment regimen.   Plan for follow-up and/or issues identified: No . I-vent associated with next due treatment: No . MD and/or nursing notified: No  Tiawanna Luchsinger K 03/26/2020 8:43 AM

## 2020-04-02 ENCOUNTER — Encounter: Payer: Self-pay | Admitting: Oncology

## 2020-04-02 NOTE — Progress Notes (Signed)
Patient is coming in for follow up, says after chemo 6-8 days he breaks out in hives and itches. CT results as well

## 2020-04-03 ENCOUNTER — Inpatient Hospital Stay (HOSPITAL_BASED_OUTPATIENT_CLINIC_OR_DEPARTMENT_OTHER): Payer: 59 | Admitting: Oncology

## 2020-04-03 ENCOUNTER — Inpatient Hospital Stay: Payer: 59 | Attending: Oncology

## 2020-04-03 ENCOUNTER — Other Ambulatory Visit: Payer: Self-pay

## 2020-04-03 ENCOUNTER — Inpatient Hospital Stay: Payer: 59

## 2020-04-03 VITALS — BP 129/77 | HR 88 | Temp 98.1°F | Resp 16 | Wt 180.0 lb

## 2020-04-03 DIAGNOSIS — T451X5A Adverse effect of antineoplastic and immunosuppressive drugs, initial encounter: Secondary | ICD-10-CM | POA: Diagnosis not present

## 2020-04-03 DIAGNOSIS — Z5112 Encounter for antineoplastic immunotherapy: Secondary | ICD-10-CM | POA: Insufficient documentation

## 2020-04-03 DIAGNOSIS — Z87891 Personal history of nicotine dependence: Secondary | ICD-10-CM | POA: Insufficient documentation

## 2020-04-03 DIAGNOSIS — D701 Agranulocytosis secondary to cancer chemotherapy: Secondary | ICD-10-CM | POA: Diagnosis not present

## 2020-04-03 DIAGNOSIS — J45909 Unspecified asthma, uncomplicated: Secondary | ICD-10-CM | POA: Insufficient documentation

## 2020-04-03 DIAGNOSIS — C3412 Malignant neoplasm of upper lobe, left bronchus or lung: Secondary | ICD-10-CM | POA: Insufficient documentation

## 2020-04-03 DIAGNOSIS — Z95828 Presence of other vascular implants and grafts: Secondary | ICD-10-CM

## 2020-04-03 DIAGNOSIS — Z5111 Encounter for antineoplastic chemotherapy: Secondary | ICD-10-CM | POA: Diagnosis not present

## 2020-04-03 LAB — CBC WITH DIFFERENTIAL/PLATELET
Abs Immature Granulocytes: 0.01 10*3/uL (ref 0.00–0.07)
Basophils Absolute: 0 10*3/uL (ref 0.0–0.1)
Basophils Relative: 0 %
Eosinophils Absolute: 0.1 10*3/uL (ref 0.0–0.5)
Eosinophils Relative: 4 %
HCT: 34.9 % — ABNORMAL LOW (ref 39.0–52.0)
Hemoglobin: 11.6 g/dL — ABNORMAL LOW (ref 13.0–17.0)
Immature Granulocytes: 0 %
Lymphocytes Relative: 46 %
Lymphs Abs: 1.1 10*3/uL (ref 0.7–4.0)
MCH: 31.7 pg (ref 26.0–34.0)
MCHC: 33.2 g/dL (ref 30.0–36.0)
MCV: 95.4 fL (ref 80.0–100.0)
Monocytes Absolute: 0.5 10*3/uL (ref 0.1–1.0)
Monocytes Relative: 18 %
Neutro Abs: 0.8 10*3/uL — ABNORMAL LOW (ref 1.7–7.7)
Neutrophils Relative %: 32 %
Platelets: 230 10*3/uL (ref 150–400)
RBC: 3.66 MIL/uL — ABNORMAL LOW (ref 4.22–5.81)
RDW: 15.1 % (ref 11.5–15.5)
WBC: 2.5 10*3/uL — ABNORMAL LOW (ref 4.0–10.5)
nRBC: 0 % (ref 0.0–0.2)

## 2020-04-03 LAB — COMPREHENSIVE METABOLIC PANEL
ALT: 37 U/L (ref 0–44)
AST: 30 U/L (ref 15–41)
Albumin: 3.9 g/dL (ref 3.5–5.0)
Alkaline Phosphatase: 105 U/L (ref 38–126)
Anion gap: 12 (ref 5–15)
BUN: 21 mg/dL — ABNORMAL HIGH (ref 6–20)
CO2: 23 mmol/L (ref 22–32)
Calcium: 9 mg/dL (ref 8.9–10.3)
Chloride: 101 mmol/L (ref 98–111)
Creatinine, Ser: 0.93 mg/dL (ref 0.61–1.24)
GFR calc Af Amer: >60 mL/min (ref >60)
GFR calc non Af Amer: >60 mL/min (ref >60)
Glucose, Bld: 167 mg/dL — ABNORMAL HIGH (ref 70–99)
Potassium: 4.1 mmol/L (ref 3.5–5.1)
Sodium: 136 mmol/L (ref 135–145)
Total Bilirubin: 0.7 mg/dL (ref 0.3–1.2)
Total Protein: 7.1 g/dL (ref 6.5–8.1)

## 2020-04-03 MED ORDER — SODIUM CHLORIDE 0.9% FLUSH
10.0000 mL | Freq: Once | INTRAVENOUS | Status: AC
  Administered 2020-04-03: 10 mL via INTRAVENOUS
  Filled 2020-04-03: qty 10

## 2020-04-06 NOTE — Progress Notes (Signed)
Hematology/Oncology Consult note [facility]  Telephone:([phone] [phone]  Patient Care Team: Patient, No Pcp Per as PCP - General ([place]) Telford Nab, RN as Registered Nurse Sindy Guadeloupe, MD as Consulting Physician (Hematology and Oncology) Sindy Guadeloupe, MD as Consulting Physician (Hematology and Oncology)   Name of the patient: Kyle Guerrero  [id number]  12-03-1971   Date of visit: 04/06/20  Diagnosis- Non-small cell lung cancer stage IV acT2 cN2 cM1 a with pleural involvement  Chief complaint/ Reason for visit-on treatment assessment prior to cycle 3 of carbotaxol Keytruda chemotherapy  Heme/Onc history: patient is a 49 year old male who presented to the ER with symptoms ofheaviness in his chest and upper left chest discomfort he underwent CT angio chest to rule out PE which showed 3.8 x 3.3 cm left upper palpable lung mass along with 4.4 x 3.3 cm lobulated mass in the aortopulmonary window and 2.6 x 2.4 cm left hilar mass all concerning for malignancy. Patient has also seen pulmonary and has been set up for bronchoscopy and EBUS guided biopsy on 11/23/2019. Patient underwent. PET CT scan which showed a hypermetabolic spiculated 3.5 cm of 5 left upper lobe lung mass, adjacent hypermetabolic 3.2 x 1.2 cm pleural metastases in the medial posterior by the left pleural space along with scalloping of the adjacent posterior left third rib. Hypermetabolic infiltrative left perihilar conglomerate nodal metastases measuring up to 7.3 x 3.6 cm and 0.8 cm high left mediastinal node between the left brachiocephalic vein and left subclavian artery. No evidence of distant metastatic disease  Biopsy showed non-small cell lung cancer but further characterization could not be determined. Insufficient tissue for NGS testing. Repeat biopsy done. Results of NGStestingshowed PD-L1 50%. Tumor mutational burden high. 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  Interval history-patient is currently doing well and denies any complaints at this time.  Appetite and weight have remained stable.  Denies any significant shortness of breath  ECOG PS- 0 Pain scale- 0   Review of systems- Review of Systems  Constitutional: Negative for chills, fever, malaise/fatigue and weight loss.  HENT: Negative for congestion, ear discharge and nosebleeds.   Eyes: Negative for blurred vision.  Respiratory: Negative for cough, hemoptysis, sputum production, shortness of breath and wheezing.   Cardiovascular: Negative for chest pain, palpitations, orthopnea and claudication.  Gastrointestinal: Negative for abdominal pain, blood in stool, constipation, diarrhea, heartburn, melena, nausea and vomiting.  Genitourinary: Negative for dysuria, flank pain, frequency, hematuria and urgency.  Musculoskeletal: Negative for back pain, joint pain and myalgias.  Skin: Negative for rash.  Neurological: Negative for dizziness, tingling, focal weakness, seizures, weakness and headaches.  Endo/Heme/Allergies: Does not bruise/bleed easily.  Psychiatric/Behavioral: Negative for depression and suicidal ideas. The patient does not have insomnia.      No Known Allergies   Past Medical History:  Diagnosis Date  . Asthma   . Cancer [facility])      Past Surgical History:  Procedure Laterality Date  . CYST EXCISION    . IR IMAGING GUIDED PORT INSERTION  12/05/2019  . VIDEO BRONCHOSCOPY WITH ENDOBRONCHIAL ULTRASOUND Left 11/22/2019   Procedure: VIDEO BRONCHOSCOPY WITH ENDOBRONCHIAL ULTRASOUND;  Surgeon: Tyler Pita, MD;  Location: [facility] ORS;  Service: Thoracic;  Laterality: Left;    Social History   Socioeconomic History  . Marital status: Single    Spouse name: Not on file  . Number of children: Not on file  . Years of  education: Not on file  . Highest education level: Not on file  Occupational History  . Not  on file  Tobacco Use  . Smoking status: Former Smoker    Packs/day: 2.00    Types: Cigarettes    Quit date: 11/08/2019    Years since quitting: 0.4  . Smokeless tobacco: Never Used  Substance and Sexual Activity  . Alcohol use: Not Currently  . Drug use: Yes    Types: Marijuana  . Sexual activity: Not on file  Other Topics Concern  . Not on file  Social History Narrative  . Not on file   Social Determinants of Health   Financial Resource Strain:   . Difficulty of Paying Living Expenses:   Food Insecurity:   . Worried About Charity fundraiser in the Last Year:   . Arboriculturist in the Last Year:   Transportation Needs:   . Film/video editor (Medical):   Marland Kitchen Lack of Transportation (Non-Medical):   Physical Activity:   . Days of Exercise per Week:   . Minutes of Exercise per Session:   Stress:   . Feeling of Stress :   Social Connections:   . Frequency of Communication with Friends and Family:   . Frequency of Social Gatherings with Friends and Family:   . Attends Religious Services:   . Active Member of Clubs or Organizations:   . Attends Archivist Meetings:   Marland Kitchen Marital Status:   Intimate Partner Violence:   . Fear of Current or Ex-Partner:   . Emotionally Abused:   Marland Kitchen Physically Abused:   . Sexually Abused:     Family History  Problem Relation Age of Onset  . Healthy Mother   . Healthy Father      Current Outpatient Medications:  .  azelastine (ASTELIN) 0.1 % nasal spray, Place 1 spray into the nose as needed. , Disp: , Rfl:  .  sucralfate (CARAFATE) 1 g tablet, Take 1 tablet (1 g total) by mouth 3 (three) times daily before meals. Dissolve in warm water, swish and swallow (Patient taking differently: Take 1 g by mouth 4 (four) times daily as needed. Dissolve in warm water, swish and swallow), Disp: 90 tablet, Rfl: 6  Physical exam:  Vitals:   04/03/20 0831  BP: 129/77  Pulse: 88  Resp: 16  Temp: 98.1 F (36.7 C)  TempSrc: Tympanic   SpO2: 100%  Weight: 180 lb (81.6 kg)   Physical Exam HENT:     Head: Normocephalic and atraumatic.  Eyes:     Pupils: Pupils are equal, round, and reactive to light.  Cardiovascular:     Rate and Rhythm: Normal rate and regular rhythm.     Heart sounds: Normal heart sounds.  Pulmonary:     Effort: Pulmonary effort is normal.     Breath sounds: Normal breath sounds.  Abdominal:     General: Bowel sounds are normal.     Palpations: Abdomen is soft.  Musculoskeletal:     Cervical back: Normal range of motion.     Comments: Digital clubbing  Skin:    General: Skin is warm and dry.  Neurological:     Mental Status: He is alert and oriented to person, place, and time.      CMP Latest Ref Rng & Units 04/03/2020  Glucose 70 - 99 mg/dL 167(H)  BUN 6 - 20 mg/dL 21(H)  Creatinine 0.61 - 1.24 mg/dL 0.93  Sodium 135 - 145 mmol/L  136  Potassium 3.5 - 5.1 mmol/L 4.1  Chloride 98 - 111 mmol/L 101  CO2 22 - 32 mmol/L 23  Calcium 8.9 - 10.3 mg/dL 9.0  Total Protein 6.5 - 8.1 g/dL 7.1  Total Bilirubin 0.3 - 1.2 mg/dL 0.7  Alkaline Phos 38 - 126 U/L 105  AST 15 - 41 U/L 30  ALT 0 - 44 U/L 37   CBC Latest Ref Rng & Units 04/03/2020  WBC 4.0 - 10.5 K/uL 2.5(L)  Hemoglobin 13.0 - 17.0 g/dL 11.6(L)  Hematocrit 39.0 - 52.0 % 34.9(L)  Platelets 150 - 400 K/uL 230    No images are attached to the encounter.  CT Chest W Contrast  Result Date: 03/24/2020 CLINICAL DATA:  Restaging lung cancer. EXAM: CT CHEST AND ABDOMEN WITH CONTRAST TECHNIQUE: Multidetector CT imaging of the chest and abdomen was performed following the standard protocol during bolus administration of intravenous contrast. CONTRAST:  134m OMNIPAQUE IOHEXOL 300 MG/ML  SOLN COMPARISON:  02/03/2020 FINDINGS: CT CHEST FINDINGS Cardiovascular: The heart is normal in size. No pericardial effusion. The aorta is normal in caliber. No dissection. No atherosclerotic calcifications. The branch vessels are patent. No definite coronary  artery calcifications. Mediastinum/Nodes: Persistent soft tissue density in the left hilum and AP window measuring approximately 30 x 28.5 mm. This previously measured approximately 39 x 31 mm. Adjacent left hilar node measures 11 mm and previously measured 12 mm. No subcarinal or contralateral adenopathy. The esophagus is grossly normal. Lungs/Pleura: Left upper lobe pulmonary lesion measures 30 x 19.5 x 14 mm. This previously measured 34 x 23 x 14.5 mm. New patchy sub solid nodular density in the superior segment of the left lower lobe on image 63/3 is most likely inflammatory change. Attention on future scans is suggested. Vague nodularity in the right middle lobe medially is stable and appears inflammatory or atelectatic. Attention on future scans is suggested. No findings for pulmonary metastatic disease. No pleural effusions. Musculoskeletal: No chest wall mass, supraclavicular or axillary adenopathy. The thyroid gland is normal. The right sided Port-A-Cath is stable. CT ABDOMEN FINDINGS Hepatobiliary: Stable 8 mm low-attenuation lesion at the left hepatic dome, likely benign cyst. No worrisome hepatic lesions or intrahepatic biliary dilatation. The gallbladder appears normal. No common bile duct dilatation. Pancreas: No mass, inflammation or ductal dilatation. Spleen: Normal size. No focal lesions. Adrenals/Urinary Tract: The adrenal glands and kidneys are unremarkable. Stomach/Bowel: The stomach, duodenum, visualized small bowel and visualize colon are unremarkable. Vascular/Lymphatic: The aorta and branch vessels are normal. The major venous structures are patent. No upper abdominal adenopathy. Other: No ascites or abdominal wall hernia. Musculoskeletal: No significant bony findings. IMPRESSION: 1. Slight interval decrease in size of the left upper lobe pulmonary lesion. 2. Slight interval decrease in size of the left hilar and AP window nodal disease. 3. No change in vague right middle lobe nodular  density. 4. New sub solid nodular density in the superior segment the left lower lobe, likely inflammatory. Recommend attention on future scans. 5. No findings for upper abdominal metastatic disease. Electronically Signed   By: PMarijo SanesM.D.   On: 03/24/2020 12:34   CT Abdomen W Contrast  Result Date: 03/24/2020 CLINICAL DATA:  Restaging lung cancer. EXAM: CT CHEST AND ABDOMEN WITH CONTRAST TECHNIQUE: Multidetector CT imaging of the chest and abdomen was performed following the standard protocol during bolus administration of intravenous contrast. CONTRAST:  1052mOMNIPAQUE IOHEXOL 300 MG/ML  SOLN COMPARISON:  02/03/2020 FINDINGS: CT CHEST FINDINGS Cardiovascular: The heart is normal  in size. No pericardial effusion. The aorta is normal in caliber. No dissection. No atherosclerotic calcifications. The branch vessels are patent. No definite coronary artery calcifications. Mediastinum/Nodes: Persistent soft tissue density in the left hilum and AP window measuring approximately 30 x 28.5 mm. This previously measured approximately 39 x 31 mm. Adjacent left hilar node measures 11 mm and previously measured 12 mm. No subcarinal or contralateral adenopathy. The esophagus is grossly normal. Lungs/Pleura: Left upper lobe pulmonary lesion measures 30 x 19.5 x 14 mm. This previously measured 34 x 23 x 14.5 mm. New patchy sub solid nodular density in the superior segment of the left lower lobe on image 63/3 is most likely inflammatory change. Attention on future scans is suggested. Vague nodularity in the right middle lobe medially is stable and appears inflammatory or atelectatic. Attention on future scans is suggested. No findings for pulmonary metastatic disease. No pleural effusions. Musculoskeletal: No chest wall mass, supraclavicular or axillary adenopathy. The thyroid gland is normal. The right sided Port-A-Cath is stable. CT ABDOMEN FINDINGS Hepatobiliary: Stable 8 mm low-attenuation lesion at the left hepatic  dome, likely benign cyst. No worrisome hepatic lesions or intrahepatic biliary dilatation. The gallbladder appears normal. No common bile duct dilatation. Pancreas: No mass, inflammation or ductal dilatation. Spleen: Normal size. No focal lesions. Adrenals/Urinary Tract: The adrenal glands and kidneys are unremarkable. Stomach/Bowel: The stomach, duodenum, visualized small bowel and visualize colon are unremarkable. Vascular/Lymphatic: The aorta and branch vessels are normal. The major venous structures are patent. No upper abdominal adenopathy. Other: No ascites or abdominal wall hernia. Musculoskeletal: No significant bony findings. IMPRESSION: 1. Slight interval decrease in size of the left upper lobe pulmonary lesion. 2. Slight interval decrease in size of the left hilar and AP window nodal disease. 3. No change in vague right middle lobe nodular density. 4. New sub solid nodular density in the superior segment the left lower lobe, likely inflammatory. Recommend attention on future scans. 5. No findings for upper abdominal metastatic disease. Electronically Signed   By: Marijo Sanes M.D.   On: 03/24/2020 12:34     Assessment and plan- Patient is a 49 y.o. male with StageIVa non-small cell lung cancer cT2 cN2 cM1a.He is s/p concurrent chemoradiation with carbotaxol for 7 weeks.   Is s/p 2 cycles of carbotaxol Keytruda chemotherapy and here for on treatment assessment prior to cycle 3  Patient is roughly received 4 cycles of carbotaxol given that he received chemotherapy along with radiation as well.Scans after 2 more cycles of carbotaxol Keytruda shows continued decrease in the size of the left upper lobe pulmonary lesion as well as hilar and AP window adenopathy.  No other findings of metastatic disease.  Patient received Neulasta with his last cycle of carbotaxol Keytruda and it has been 3 weeks since his chemotherapy but his white cell count is still low at 2.5 with an ANC of 0.8.  I will therefore  hold his chemotherapy today.  I will plan to see him in 3 weeks.  Given his persistent neutropenia we will hold off on giving him further chemotherapy and plan to continue with single agent Keytruda alone every 3 weeks until progression or toxicity  Patient will directly proceed for Keytruda next week and I will see him back in 4 weeks with CBC with differential, CMP and TSH for next cycle of Keytruda   Visit Diagnosis 1. Encounter for antineoplastic chemotherapy   2. Chemotherapy induced neutropenia (HCC)   3. Malignant neoplasm of upper lobe of  left lung [facility])      Dr. Randa Evens, MD, MPH [facility] at [facility] [id number] 04/06/2020 8:28 AM

## 2020-04-10 ENCOUNTER — Other Ambulatory Visit: Payer: Self-pay

## 2020-04-10 ENCOUNTER — Inpatient Hospital Stay: Payer: 59

## 2020-04-10 ENCOUNTER — Other Ambulatory Visit: Payer: Self-pay | Admitting: Oncology

## 2020-04-10 VITALS — BP 117/78 | HR 71 | Temp 97.6°F | Resp 18 | Wt 183.1 lb

## 2020-04-10 DIAGNOSIS — C3412 Malignant neoplasm of upper lobe, left bronchus or lung: Secondary | ICD-10-CM | POA: Diagnosis not present

## 2020-04-10 DIAGNOSIS — Z95828 Presence of other vascular implants and grafts: Secondary | ICD-10-CM

## 2020-04-10 LAB — CBC WITH DIFFERENTIAL/PLATELET
Abs Immature Granulocytes: 0 10*3/uL (ref 0.00–0.07)
Basophils Absolute: 0 10*3/uL (ref 0.0–0.1)
Basophils Relative: 0 %
Eosinophils Absolute: 0.2 10*3/uL (ref 0.0–0.5)
Eosinophils Relative: 5 %
HCT: 35.2 % — ABNORMAL LOW (ref 39.0–52.0)
Hemoglobin: 11.8 g/dL — ABNORMAL LOW (ref 13.0–17.0)
Immature Granulocytes: 0 %
Lymphocytes Relative: 37 %
Lymphs Abs: 1.1 10*3/uL (ref 0.7–4.0)
MCH: 32.2 pg (ref 26.0–34.0)
MCHC: 33.5 g/dL (ref 30.0–36.0)
MCV: 96.2 fL (ref 80.0–100.0)
Monocytes Absolute: 0.5 10*3/uL (ref 0.1–1.0)
Monocytes Relative: 16 %
Neutro Abs: 1.3 10*3/uL — ABNORMAL LOW (ref 1.7–7.7)
Neutrophils Relative %: 42 %
Platelets: 298 10*3/uL (ref 150–400)
RBC: 3.66 MIL/uL — ABNORMAL LOW (ref 4.22–5.81)
RDW: 14.8 % (ref 11.5–15.5)
WBC: 3 10*3/uL — ABNORMAL LOW (ref 4.0–10.5)
nRBC: 0 % (ref 0.0–0.2)

## 2020-04-10 LAB — COMPREHENSIVE METABOLIC PANEL
ALT: 29 U/L (ref 0–44)
AST: 21 U/L (ref 15–41)
Albumin: 3.7 g/dL (ref 3.5–5.0)
Alkaline Phosphatase: 98 U/L (ref 38–126)
Anion gap: 7 (ref 5–15)
BUN: 14 mg/dL (ref 6–20)
CO2: 26 mmol/L (ref 22–32)
Calcium: 8.8 mg/dL — ABNORMAL LOW (ref 8.9–10.3)
Chloride: 103 mmol/L (ref 98–111)
Creatinine, Ser: 0.89 mg/dL (ref 0.61–1.24)
GFR calc Af Amer: >60 mL/min (ref >60)
GFR calc non Af Amer: >60 mL/min (ref >60)
Glucose, Bld: 132 mg/dL — ABNORMAL HIGH (ref 70–99)
Potassium: 4 mmol/L (ref 3.5–5.1)
Sodium: 136 mmol/L (ref 135–145)
Total Bilirubin: 0.7 mg/dL (ref 0.3–1.2)
Total Protein: 6.8 g/dL (ref 6.5–8.1)

## 2020-04-10 MED ORDER — SODIUM CHLORIDE 0.9 % IV SOLN
200.0000 mg | Freq: Once | INTRAVENOUS | Status: AC
  Administered 2020-04-10: 200 mg via INTRAVENOUS
  Filled 2020-04-10: qty 8

## 2020-04-10 MED ORDER — SODIUM CHLORIDE 0.9 % IV SOLN
Freq: Once | INTRAVENOUS | Status: AC
  Filled 2020-04-10: qty 250

## 2020-04-10 MED ORDER — HEPARIN SOD (PORK) LOCK FLUSH 100 UNIT/ML IV SOLN
500.0000 [IU] | Freq: Once | INTRAVENOUS | Status: DC | PRN
  Filled 2020-04-10: qty 5

## 2020-04-10 MED ORDER — SODIUM CHLORIDE 0.9% FLUSH
10.0000 mL | INTRAVENOUS | Status: DC | PRN
  Administered 2020-04-10: 10 mL via INTRAVENOUS
  Filled 2020-04-10: qty 10

## 2020-04-10 MED ORDER — HEPARIN SOD (PORK) LOCK FLUSH 100 UNIT/ML IV SOLN
500.0000 [IU] | Freq: Once | INTRAVENOUS | Status: AC
  Administered 2020-04-10: 500 [IU] via INTRAVENOUS
  Filled 2020-04-10: qty 5

## 2020-04-10 NOTE — Progress Notes (Signed)
0935: WBC 3.0 and ANC 1.3, Per Dr. Janese Banks okay to proceed with [facility] as scheduled at this time.

## 2020-04-24 NOTE — Progress Notes (Signed)
Pharmacist Chemotherapy Monitoring - Follow Up Assessment    I verify that I have reviewed each item in the below checklist:  . Regimen for the patient is scheduled for the appropriate day and plan matches scheduled date. Marland Kitchen Appropriate non-routine labs are ordered dependent on drug ordered. . If applicable, additional medications reviewed and ordered per protocol based on lifetime cumulative doses and/or treatment regimen.   Plan for follow-up and/or issues identified: No . I-vent associated with next due treatment: No . MD and/or nursing notified: No  Kyle Guerrero 04/24/2020 8:39 AM

## 2020-05-01 ENCOUNTER — Inpatient Hospital Stay: Payer: 59

## 2020-05-01 ENCOUNTER — Inpatient Hospital Stay: Payer: 59 | Attending: Oncology

## 2020-05-01 ENCOUNTER — Encounter: Payer: Self-pay | Admitting: Oncology

## 2020-05-01 ENCOUNTER — Inpatient Hospital Stay (HOSPITAL_BASED_OUTPATIENT_CLINIC_OR_DEPARTMENT_OTHER): Payer: 59 | Admitting: Oncology

## 2020-05-01 ENCOUNTER — Other Ambulatory Visit: Payer: Self-pay

## 2020-05-01 VITALS — BP 118/78 | HR 80 | Temp 96.0°F | Resp 16 | Ht 71.0 in | Wt 181.0 lb

## 2020-05-01 VITALS — BP 119/77 | HR 72 | Resp 16

## 2020-05-01 DIAGNOSIS — J45909 Unspecified asthma, uncomplicated: Secondary | ICD-10-CM | POA: Insufficient documentation

## 2020-05-01 DIAGNOSIS — Z95828 Presence of other vascular implants and grafts: Secondary | ICD-10-CM

## 2020-05-01 DIAGNOSIS — Z5112 Encounter for antineoplastic immunotherapy: Secondary | ICD-10-CM | POA: Insufficient documentation

## 2020-05-01 DIAGNOSIS — Z87891 Personal history of nicotine dependence: Secondary | ICD-10-CM | POA: Diagnosis not present

## 2020-05-01 DIAGNOSIS — C3412 Malignant neoplasm of upper lobe, left bronchus or lung: Secondary | ICD-10-CM

## 2020-05-01 DIAGNOSIS — Z79899 Other long term (current) drug therapy: Secondary | ICD-10-CM | POA: Diagnosis not present

## 2020-05-01 LAB — COMPREHENSIVE METABOLIC PANEL
ALT: 18 U/L (ref 0–44)
AST: 21 U/L (ref 15–41)
Albumin: 3.8 g/dL (ref 3.5–5.0)
Alkaline Phosphatase: 103 U/L (ref 38–126)
Anion gap: 11 (ref 5–15)
BUN: 19 mg/dL (ref 6–20)
CO2: 23 mmol/L (ref 22–32)
Calcium: 8.9 mg/dL (ref 8.9–10.3)
Chloride: 102 mmol/L (ref 98–111)
Creatinine, Ser: 1.07 mg/dL (ref 0.61–1.24)
GFR calc Af Amer: >60 mL/min (ref >60)
GFR calc non Af Amer: >60 mL/min (ref >60)
Glucose, Bld: 108 mg/dL — ABNORMAL HIGH (ref 70–99)
Potassium: 3.8 mmol/L (ref 3.5–5.1)
Sodium: 136 mmol/L (ref 135–145)
Total Bilirubin: 0.4 mg/dL (ref 0.3–1.2)
Total Protein: 7.6 g/dL (ref 6.5–8.1)

## 2020-05-01 LAB — CBC WITH DIFFERENTIAL/PLATELET
Abs Immature Granulocytes: 0.02 10*3/uL (ref 0.00–0.07)
Basophils Absolute: 0 10*3/uL (ref 0.0–0.1)
Basophils Relative: 0 %
Eosinophils Absolute: 0.3 10*3/uL (ref 0.0–0.5)
Eosinophils Relative: 7 %
HCT: 39.7 % (ref 39.0–52.0)
Hemoglobin: 13.3 g/dL (ref 13.0–17.0)
Immature Granulocytes: 0 %
Lymphocytes Relative: 33 %
Lymphs Abs: 1.6 10*3/uL (ref 0.7–4.0)
MCH: 31.3 pg (ref 26.0–34.0)
MCHC: 33.5 g/dL (ref 30.0–36.0)
MCV: 93.4 fL (ref 80.0–100.0)
Monocytes Absolute: 0.6 10*3/uL (ref 0.1–1.0)
Monocytes Relative: 12 %
Neutro Abs: 2.4 10*3/uL (ref 1.7–7.7)
Neutrophils Relative %: 48 %
Platelets: 303 10*3/uL (ref 150–400)
RBC: 4.25 MIL/uL (ref 4.22–5.81)
RDW: 13.1 % (ref 11.5–15.5)
WBC: 5 10*3/uL (ref 4.0–10.5)
nRBC: 0 % (ref 0.0–0.2)

## 2020-05-01 LAB — TSH: TSH: 0.552 u[IU]/mL (ref 0.350–4.500)

## 2020-05-01 MED ORDER — SODIUM CHLORIDE 0.9% FLUSH
10.0000 mL | INTRAVENOUS | Status: AC | PRN
  Administered 2020-05-01: 10 mL via INTRAVENOUS
  Filled 2020-05-01: qty 10

## 2020-05-01 MED ORDER — HEPARIN SOD (PORK) LOCK FLUSH 100 UNIT/ML IV SOLN
INTRAVENOUS | Status: AC
  Filled 2020-05-01: qty 5

## 2020-05-01 MED ORDER — SODIUM CHLORIDE 0.9% FLUSH
10.0000 mL | INTRAVENOUS | Status: DC | PRN
  Filled 2020-05-01: qty 10

## 2020-05-01 MED ORDER — HEPARIN SOD (PORK) LOCK FLUSH 100 UNIT/ML IV SOLN
500.0000 [IU] | Freq: Once | INTRAVENOUS | Status: AC | PRN
  Administered 2020-05-01: 500 [IU]
  Filled 2020-05-01: qty 5

## 2020-05-01 MED ORDER — SODIUM CHLORIDE 0.9 % IV SOLN
200.0000 mg | Freq: Once | INTRAVENOUS | Status: AC
  Administered 2020-05-01: 200 mg via INTRAVENOUS
  Filled 2020-05-01: qty 8

## 2020-05-01 MED ORDER — SODIUM CHLORIDE 0.9 % IV SOLN
Freq: Once | INTRAVENOUS | Status: AC
  Filled 2020-05-01: qty 250

## 2020-05-01 NOTE — Progress Notes (Signed)
Pt eating and drinking good, wt stable. He has a slight crick in his neck and has cream to put on it but does not always use it.

## 2020-05-01 NOTE — Progress Notes (Signed)
No blood return noted with flushing port prior to connecting fluids. Port flushes well with no resistance. Patient denies any pain or edema at site. Dr. Janese Banks and Judeen Hammans, RN made aware. Okay to proceed with treatment.

## 2020-05-04 NOTE — Progress Notes (Signed)
Hematology/Oncology Consult note [facility]  [phone] [phone]  Patient Care Team: Patient, No Pcp Per as PCP - General ([place]) Telford Nab, RN as Registered Nurse Sindy Guadeloupe, MD as Consulting Physician (Hematology and Oncology) Sindy Guadeloupe, MD as Consulting Physician (Hematology and Oncology)   Name of the patient: Kyle Guerrero  [id number]  February 27, 1971   Date of visit: 05/04/20  Diagnosis- Non-small cell lung cancer stage IV acT2 cN2 cM1 a with pleural involvement   Chief complaint/ Reason for visit- on treatment assessment prior to cycle 2 of maintenance keytruda  Heme/Onc history:  patient is a 49 year old male who presented to the ER with symptoms ofheaviness in his chest and upper left chest discomfort he underwent CT angio chest to rule out PE which showed 3.8 x 3.3 cm left upper palpable lung mass along with 4.4 x 3.3 cm lobulated mass in the aortopulmonary window and 2.6 x 2.4 cm left hilar mass all concerning for malignancy. Patient has also seen pulmonary and has been set up for bronchoscopy and EBUS guided biopsy on 11/23/2019. Patient underwent. PET CT scan which showed a hypermetabolic spiculated 3.5 cm of 5 left upper lobe lung mass, adjacent hypermetabolic 3.2 x 1.2 cm pleural metastases in the medial posterior by the left pleural space along with scalloping of the adjacent posterior left third rib. Hypermetabolic infiltrative left perihilar conglomerate nodal metastases measuring up to 7.3 x 3.6 cm and 0.8 cm high left mediastinal node between the left brachiocephalic vein and left subclavian artery. No evidence of distant metastatic disease  Biopsy showed non-small cell lung cancer but further characterization could not be determined. Insufficient tissue for NGS testing. Repeat biopsy done. Results of NGStestingshowed PD-L1 50%. Tumor mutational burden high. ERBB2 copy number again.NGS  testing on peripheral blood showed NTRK mutation.  Interval history-patient is currently doing well and denies any complaints at this time  ECOG PS- 0 Pain scale- 0   Review of systems- Review of Systems  Constitutional: Negative for chills, fever, malaise/fatigue and weight loss.  HENT: Negative for congestion, ear discharge and nosebleeds.   Eyes: Negative for blurred vision.  Respiratory: Negative for cough, hemoptysis, sputum production, shortness of breath and wheezing.   Cardiovascular: Negative for chest pain, palpitations, orthopnea and claudication.  Gastrointestinal: Negative for abdominal pain, blood in stool, constipation, diarrhea, heartburn, melena, nausea and vomiting.  Genitourinary: Negative for dysuria, flank pain, frequency, hematuria and urgency.  Musculoskeletal: Negative for back pain, joint pain and myalgias.  Skin: Negative for rash.  Neurological: Negative for dizziness, tingling, focal weakness, seizures, weakness and headaches.  Endo/Heme/Allergies: Does not bruise/bleed easily.  Psychiatric/Behavioral: Negative for depression and suicidal ideas. The patient does not have insomnia.      No Known Allergies   Past Medical History:  Diagnosis Date  . Asthma   . Lung cancer [facility])      Past Surgical History:  Procedure Laterality Date  . CYST EXCISION    . IR IMAGING GUIDED PORT INSERTION  12/05/2019  . VIDEO BRONCHOSCOPY WITH ENDOBRONCHIAL ULTRASOUND Left 11/22/2019   Procedure: VIDEO BRONCHOSCOPY WITH ENDOBRONCHIAL ULTRASOUND;  Surgeon: Tyler Pita, MD;  Location: [facility] ORS;  Service: Thoracic;  Laterality: Left;    Social History   Socioeconomic History  . Marital status: Single    Spouse name: Not on file  . Number of children: Not on file  . Years of education: Not on file  . Highest education level: Not on  file  Occupational History  . Not on file  Tobacco Use  . Smoking status: Former Smoker    Packs/day: 2.00    Types:  Cigarettes    Quit date: 11/08/2019    Years since quitting: 0.4  . Smokeless tobacco: Never Used  Substance and Sexual Activity  . Alcohol use: Not Currently  . Drug use: Yes    Types: Marijuana  . Sexual activity: Not on file  Other Topics Concern  . Not on file  Social History Narrative  . Not on file   Social Determinants of Health   Financial Resource Strain:   . Difficulty of Paying Living Expenses:   Food Insecurity:   . Worried About Charity fundraiser in the Last Year:   . Arboriculturist in the Last Year:   Transportation Needs:   . Film/video editor (Medical):   Marland Kitchen Lack of Transportation (Non-Medical):   Physical Activity:   . Days of Exercise per Week:   . Minutes of Exercise per Session:   Stress:   . Feeling of Stress :   Social Connections:   . Frequency of Communication with Friends and Family:   . Frequency of Social Gatherings with Friends and Family:   . Attends Religious Services:   . Active Member of Clubs or Organizations:   . Attends Archivist Meetings:   Marland Kitchen Marital Status:   Intimate Partner Violence:   . Fear of Current or Ex-Partner:   . Emotionally Abused:   Marland Kitchen Physically Abused:   . Sexually Abused:     Family History  Problem Relation Age of Onset  . Healthy Mother   . Healthy Father      Current Outpatient Medications:  .  azelastine (ASTELIN) 0.1 % nasal spray, Place 1 spray into the nose as needed. , Disp: , Rfl:  .  sucralfate (CARAFATE) 1 g tablet, Take 1 tablet (1 g total) by mouth 3 (three) times daily before meals. Dissolve in warm water, swish and swallow (Patient not taking: Reported on 05/01/2020), Disp: 90 tablet, Rfl: 6 No current facility-administered medications for this visit.  Facility-Administered Medications Ordered in Other Visits:  .  sodium chloride flush (NS) 0.9 % injection 10 mL, 10 mL, Intravenous, PRN, Sindy Guadeloupe, MD, 10 mL at 05/01/20 0900  Physical exam:  Vitals:   05/01/20 0917  BP:  118/78  Pulse: 80  Resp: 16  Temp: (!) 96 F (35.6 C)  TempSrc: Tympanic  Weight: 181 lb (82.1 kg)  Height: 5' 11"  (1.803 m)   Physical Exam Constitutional:      General: He is not in acute distress. HENT:     Head: Normocephalic and atraumatic.  Eyes:     Pupils: Pupils are equal, round, and reactive to light.  Cardiovascular:     Rate and Rhythm: Normal rate and regular rhythm.     Heart sounds: Normal heart sounds.  Pulmonary:     Effort: Pulmonary effort is normal.     Breath sounds: Normal breath sounds.  Abdominal:     General: Bowel sounds are normal.     Palpations: Abdomen is soft.  Musculoskeletal:     Cervical back: Normal range of motion.  Skin:    General: Skin is warm and dry.  Neurological:     Mental Status: He is alert and oriented to person, place, and time.      CMP Latest Ref Rng & Units 05/01/2020  Glucose 70 -  99 mg/dL 108(H)  BUN 6 - 20 mg/dL 19  Creatinine 0.61 - 1.24 mg/dL 1.07  Sodium 135 - 145 mmol/L 136  Potassium 3.5 - 5.1 mmol/L 3.8  Chloride 98 - 111 mmol/L 102  CO2 22 - 32 mmol/L 23  Calcium 8.9 - 10.3 mg/dL 8.9  Total Protein 6.5 - 8.1 g/dL 7.6  Total Bilirubin 0.3 - 1.2 mg/dL 0.4  Alkaline Phos 38 - 126 U/L 103  AST 15 - 41 U/L 21  ALT 0 - 44 U/L 18   CBC Latest Ref Rng & Units 05/01/2020  WBC 4.0 - 10.5 K/uL 5.0  Hemoglobin 13.0 - 17.0 g/dL 13.3  Hematocrit 39.0 - 52.0 % 39.7  Platelets 150 - 400 K/uL 303     Assessment and plan- Patient is a 49 y.o. male with StageIVa non-small cell lung cancer cT2 cN2 cM1a.He is s/p concurrent chemoradiation with carbotaxol for 7 weeks.   He then received 2 cycles of carbotaxol Keytruda chemotherapy and is here for on treatment assessment prior to cycle 2 of maintenance Keytruda  Interim scans after chemoradiation have shown stable disease.  There has been decrease in the size of the left upper lobe pulmonary lesion as well as hilar and mediastinal adenopathy.  Plan is to continue  Keytruda until progression or toxicity.  Counts are otherwise okay to proceed with cycle 2 of maintenance Keytruda today.  I will see him back in 3 weeks for cycle 3.  TSH today is within normal limits and he is tolerating it well without any significant side effects   Visit Diagnosis 1. Encounter for antineoplastic immunotherapy   2. Malignant neoplasm of upper lobe of left lung ([place])      Dr. Randa Evens, MD, MPH [facility] at [facility] [phone] 05/04/2020 12:26 PM

## 2020-05-15 NOTE — Progress Notes (Signed)
Pharmacist Chemotherapy Monitoring - Follow Up Assessment    I verify that I have reviewed each item in the below checklist:  . Regimen for the patient is scheduled for the appropriate day and plan matches scheduled date. Marland Kitchen Appropriate non-routine labs are ordered dependent on drug ordered. . If applicable, additional medications reviewed and ordered per protocol based on lifetime cumulative doses and/or treatment regimen.   Plan for follow-up and/or issues identified: No . I-vent associated with next due treatment: No . MD and/or nursing notified: No  Kyle Guerrero K 05/15/2020 8:20 AM

## 2020-05-22 ENCOUNTER — Inpatient Hospital Stay (HOSPITAL_BASED_OUTPATIENT_CLINIC_OR_DEPARTMENT_OTHER): Payer: 59 | Admitting: Oncology

## 2020-05-22 ENCOUNTER — Inpatient Hospital Stay: Payer: 59

## 2020-05-22 ENCOUNTER — Encounter: Payer: Self-pay | Admitting: Oncology

## 2020-05-22 ENCOUNTER — Other Ambulatory Visit: Payer: Self-pay

## 2020-05-22 VITALS — BP 127/95 | HR 97 | Temp 94.7°F | Wt 183.6 lb

## 2020-05-22 DIAGNOSIS — C3492 Malignant neoplasm of unspecified part of left bronchus or lung: Secondary | ICD-10-CM | POA: Diagnosis not present

## 2020-05-22 DIAGNOSIS — C3412 Malignant neoplasm of upper lobe, left bronchus or lung: Secondary | ICD-10-CM | POA: Diagnosis not present

## 2020-05-22 DIAGNOSIS — Z5112 Encounter for antineoplastic immunotherapy: Secondary | ICD-10-CM

## 2020-05-22 DIAGNOSIS — Z95828 Presence of other vascular implants and grafts: Secondary | ICD-10-CM

## 2020-05-22 LAB — CBC WITH DIFFERENTIAL/PLATELET
Abs Immature Granulocytes: 0.01 10*3/uL (ref 0.00–0.07)
Basophils Absolute: 0 10*3/uL (ref 0.0–0.1)
Basophils Relative: 0 %
Eosinophils Absolute: 0.3 10*3/uL (ref 0.0–0.5)
Eosinophils Relative: 7 %
HCT: 42.1 % (ref 39.0–52.0)
Hemoglobin: 14.1 g/dL (ref 13.0–17.0)
Immature Granulocytes: 0 %
Lymphocytes Relative: 40 %
Lymphs Abs: 1.8 10*3/uL (ref 0.7–4.0)
MCH: 30.9 pg (ref 26.0–34.0)
MCHC: 33.5 g/dL (ref 30.0–36.0)
MCV: 92.1 fL (ref 80.0–100.0)
Monocytes Absolute: 0.5 10*3/uL (ref 0.1–1.0)
Monocytes Relative: 12 %
Neutro Abs: 1.9 10*3/uL (ref 1.7–7.7)
Neutrophils Relative %: 41 %
Platelets: 271 10*3/uL (ref 150–400)
RBC: 4.57 MIL/uL (ref 4.22–5.81)
RDW: 13.2 % (ref 11.5–15.5)
WBC: 4.6 10*3/uL (ref 4.0–10.5)
nRBC: 0 % (ref 0.0–0.2)

## 2020-05-22 LAB — COMPREHENSIVE METABOLIC PANEL
ALT: 23 U/L (ref 0–44)
AST: 21 U/L (ref 15–41)
Albumin: 4 g/dL (ref 3.5–5.0)
Alkaline Phosphatase: 101 U/L (ref 38–126)
Anion gap: 7 (ref 5–15)
BUN: 15 mg/dL (ref 6–20)
CO2: 26 mmol/L (ref 22–32)
Calcium: 9 mg/dL (ref 8.9–10.3)
Chloride: 103 mmol/L (ref 98–111)
Creatinine, Ser: 0.96 mg/dL (ref 0.61–1.24)
GFR calc Af Amer: >60 mL/min (ref >60)
GFR calc non Af Amer: >60 mL/min (ref >60)
Glucose, Bld: 87 mg/dL (ref 70–99)
Potassium: 4 mmol/L (ref 3.5–5.1)
Sodium: 136 mmol/L (ref 135–145)
Total Bilirubin: 0.5 mg/dL (ref 0.3–1.2)
Total Protein: 7.6 g/dL (ref 6.5–8.1)

## 2020-05-22 MED ORDER — HEPARIN SOD (PORK) LOCK FLUSH 100 UNIT/ML IV SOLN
INTRAVENOUS | Status: AC
  Filled 2020-05-22: qty 5

## 2020-05-22 MED ORDER — SODIUM CHLORIDE 0.9% FLUSH
10.0000 mL | INTRAVENOUS | Status: DC | PRN
  Filled 2020-05-22: qty 10

## 2020-05-22 MED ORDER — SODIUM CHLORIDE 0.9 % IV SOLN
Freq: Once | INTRAVENOUS | Status: AC
  Filled 2020-05-22: qty 250

## 2020-05-22 MED ORDER — SODIUM CHLORIDE 0.9 % IV SOLN
200.0000 mg | Freq: Once | INTRAVENOUS | Status: AC
  Administered 2020-05-22: 200 mg via INTRAVENOUS
  Filled 2020-05-22: qty 8

## 2020-05-22 MED ORDER — HEPARIN SOD (PORK) LOCK FLUSH 100 UNIT/ML IV SOLN
500.0000 [IU] | Freq: Once | INTRAVENOUS | Status: AC | PRN
  Administered 2020-05-22: 500 [IU]
  Filled 2020-05-22: qty 5

## 2020-05-22 NOTE — Progress Notes (Signed)
Pain  In the left shoulder that's so uncomfortable the pt can not sleep described as sharp and constant.

## 2020-05-22 NOTE — Progress Notes (Signed)
MD aware of pt.'s port not having a blood return. Pt denies any discomfort or pain. Port flushes with ease. Per MD to continue with treatment through port. Pt updated and all questions answered at this time.   Analea Muller [employer]

## 2020-05-25 NOTE — Progress Notes (Signed)
Hematology/Oncology Consult note [facility]  Telephone:([phone] [phone]  Patient Care Team: Patient, No Pcp Per as PCP - General ([place]) Telford Nab, RN as Registered Nurse Sindy Guadeloupe, MD as Consulting Physician (Hematology and Oncology) Sindy Guadeloupe, MD as Consulting Physician (Hematology and Oncology)   Name of the patient: Kyle Guerrero  [id number]  01-31-1971   Date of visit: 05/25/20  Diagnosis- Non-small cell lung cancer stage IV acT2 cN2 cM1 a with pleural involvement  Chief complaint/ Reason for visit-on treatment assessment prior to cycle 3 of maintenance Keytruda  Heme/Onc history: patient is a 49 year old male who presented to the ER with symptoms ofheaviness in his chest and upper left chest discomfort he underwent CT angio chest to rule out PE which showed 3.8 x 3.3 cm left upper palpable lung mass along with 4.4 x 3.3 cm lobulated mass in the aortopulmonary window and 2.6 x 2.4 cm left hilar mass all concerning for malignancy. Patient has also seen pulmonary and has been set up for bronchoscopy and EBUS guided biopsy on 11/23/2019. Patient underwent. PET CT scan which showed a hypermetabolic spiculated 3.5 cm of 5 left upper lobe lung mass, adjacent hypermetabolic 3.2 x 1.2 cm pleural metastases in the medial posterior by the left pleural space along with scalloping of the adjacent posterior left third rib. Hypermetabolic infiltrative left perihilar conglomerate nodal metastases measuring up to 7.3 x 3.6 cm and 0.8 cm high left mediastinal node between the left brachiocephalic vein and left subclavian artery. No evidence of distant metastatic disease  Biopsy showed non-small cell lung cancer but further characterization could not be determined. Insufficient tissue for NGS testing. Repeat biopsy done. Results of NGStestingshowed PD-L1 50%. Tumor mutational burden high. 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   Interval history-patient states that he injured his left shoulder while working out.  He did not have any problem with range of motion but eventually keeps his arm down by the side he has pain that radiates to his left arm.  Denies any chest pain or shortness of breath.  Appetite and weight have remained stable.  ECOG PS- 0 Pain scale- 0   Review of systems- Review of Systems  Constitutional: Negative for chills, fever, malaise/fatigue and weight loss.  HENT: Negative for congestion, ear discharge and nosebleeds.   Eyes: Negative for blurred vision.  Respiratory: Negative for cough, hemoptysis, sputum production, shortness of breath and wheezing.   Cardiovascular: Negative for chest pain, palpitations, orthopnea and claudication.  Gastrointestinal: Negative for abdominal pain, blood in stool, constipation, diarrhea, heartburn, melena, nausea and vomiting.  Genitourinary: Negative for dysuria, flank pain, frequency, hematuria and urgency.  Musculoskeletal: Negative for back pain, joint pain and myalgias.  Skin: Negative for rash.  Neurological: Negative for dizziness, tingling, focal weakness, seizures, weakness and headaches.  Endo/Heme/Allergies: Does not bruise/bleed easily.  Psychiatric/Behavioral: Negative for depression and suicidal ideas. The patient does not have insomnia.     No Known Allergies   Past Medical History:  Diagnosis Date  . Asthma   . Lung cancer [facility])      Past Surgical History:  Procedure Laterality Date  . CYST EXCISION    . IR IMAGING GUIDED PORT INSERTION  12/05/2019  . VIDEO BRONCHOSCOPY WITH ENDOBRONCHIAL ULTRASOUND Left 11/22/2019   Procedure: VIDEO BRONCHOSCOPY WITH ENDOBRONCHIAL ULTRASOUND;  Surgeon: Tyler Pita, MD;  Location: [facility] ORS;  Service: Thoracic;  Laterality: Left;    Social  History   Socioeconomic History  . Marital status: Single    Spouse name: Not on file  . Number of  children: Not on file  . Years of education: Not on file  . Highest education level: Not on file  Occupational History  . Not on file  Tobacco Use  . Smoking status: Former Smoker    Packs/day: 2.00    Types: Cigarettes    Quit date: 11/08/2019    Years since quitting: 0.5  . Smokeless tobacco: Never Used  Substance and Sexual Activity  . Alcohol use: Not Currently  . Drug use: Yes    Types: Marijuana  . Sexual activity: Not on file  Other Topics Concern  . Not on file  Social History Narrative  . Not on file   Social Determinants of Health   Financial Resource Strain:   . Difficulty of Paying Living Expenses:   Food Insecurity:   . Worried About Charity fundraiser in the Last Year:   . Arboriculturist in the Last Year:   Transportation Needs:   . Film/video editor (Medical):   Marland Kitchen Lack of Transportation (Non-Medical):   Physical Activity:   . Days of Exercise per Week:   . Minutes of Exercise per Session:   Stress:   . Feeling of Stress :   Social Connections:   . Frequency of Communication with Friends and Family:   . Frequency of Social Gatherings with Friends and Family:   . Attends Religious Services:   . Active Member of Clubs or Organizations:   . Attends Archivist Meetings:   Marland Kitchen Marital Status:   Intimate Partner Violence:   . Fear of Current or Ex-Partner:   . Emotionally Abused:   Marland Kitchen Physically Abused:   . Sexually Abused:     Family History  Problem Relation Age of Onset  . Healthy Mother   . Healthy Father      Current Outpatient Medications:  .  azelastine (ASTELIN) 0.1 % nasal spray, Place 1 spray into the nose as needed. , Disp: , Rfl:  .  cyclobenzaprine (FLEXERIL) 5 MG tablet, Take 5 mg by mouth 3 (three) times daily as needed., Disp: , Rfl:  .  sucralfate (CARAFATE) 1 g tablet, Take 1 tablet (1 g total) by mouth 3 (three) times daily before meals. Dissolve in warm water, swish and swallow (Patient not taking: Reported on  05/01/2020), Disp: 90 tablet, Rfl: 6 No current facility-administered medications for this visit.  Facility-Administered Medications Ordered in Other Visits:  .  sodium chloride flush (NS) 0.9 % injection 10 mL, 10 mL, Intravenous, PRN, Sindy Guadeloupe, MD, 10 mL at 05/01/20 0900  Physical exam:  Vitals:   05/22/20 1008  BP: (!) 127/95  Pulse: 97  Temp: (!) 94.7 F (34.8 C)  TempSrc: Tympanic  SpO2: 100%  Weight: 183 lb 9.6 oz (83.3 kg)   Physical Exam Constitutional:      General: He is not in acute distress. Cardiovascular:     Rate and Rhythm: Normal rate and regular rhythm.     Heart sounds: Normal heart sounds.  Pulmonary:     Effort: Pulmonary effort is normal.     Breath sounds: Normal breath sounds.  Abdominal:     General: Bowel sounds are normal.     Palpations: Abdomen is soft.  Skin:    General: Skin is warm and dry.  Neurological:     Mental Status: He is alert  and oriented to person, place, and time.      CMP Latest Ref Rng & Units 05/22/2020  Glucose 70 - 99 mg/dL 87  BUN 6 - 20 mg/dL 15  Creatinine 0.61 - 1.24 mg/dL 0.96  Sodium 135 - 145 mmol/L 136  Potassium 3.5 - 5.1 mmol/L 4.0  Chloride 98 - 111 mmol/L 103  CO2 22 - 32 mmol/L 26  Calcium 8.9 - 10.3 mg/dL 9.0  Total Protein 6.5 - 8.1 g/dL 7.6  Total Bilirubin 0.3 - 1.2 mg/dL 0.5  Alkaline Phos 38 - 126 U/L 101  AST 15 - 41 U/L 21  ALT 0 - 44 U/L 23   CBC Latest Ref Rng & Units 05/22/2020  WBC 4.0 - 10.5 K/uL 4.6  Hemoglobin 13.0 - 17.0 g/dL 14.1  Hematocrit 39.0 - 52.0 % 42.1  Platelets 150 - 400 K/uL 271     Assessment and plan- Patient is a 49 y.o. male with stage IV adenocarcinoma of the lung with pleural metastases here for on treatment assessment prior to cycle 3 of maintenance Keytruda  Plan proceed with cycle 3 of maintenance Keytruda today.  I will see him back in 3 weeks for cycle 4. I will plan to get repeat scans next month.  Left shoulder pain: Suspect musculoskeletal secondary  to recent exercise.  If symptoms persist I will consider getting imaging sooner.   Visit Diagnosis 1. Encounter for antineoplastic immunotherapy   2. Non-small cell cancer of left lung ([place])      Dr. Randa Evens, MD, MPH [facility] [phone] 05/25/2020 8:16 PM

## 2020-06-11 ENCOUNTER — Other Ambulatory Visit: Payer: Self-pay

## 2020-06-11 ENCOUNTER — Ambulatory Visit
Admission: RE | Admit: 2020-06-11 | Discharge: 2020-06-11 | Disposition: A | Discharge status: Home / Self Care | Payer: 59 | Source: Ambulatory Visit | Attending: Radiation Oncology | Admitting: Radiation Oncology

## 2020-06-11 DIAGNOSIS — C3412 Malignant neoplasm of upper lobe, left bronchus or lung: Secondary | ICD-10-CM

## 2020-06-11 IMAGING — CT CT CHEST W/ CM
2 of 4 series · 15 of 36 positions shown, 18 images · IV contrast (omnipaque)
Comparison: [DATE]

CLINICAL DATA: Restaging non-small cell lung cancer.

EXAM:
CT CHEST WITH CONTRAST
TECHNIQUE: Multidetector CT imaging of the chest was performed during
intravenous contrast administration.
CONTRAST:  75mL OMNIPAQUE IOHEXOL 300 MG/ML  SOLN

[Series 2: axial chest 2.00 · axial · 0.65mm/px · z∈[-1256,-972]mm · 12 of 168 slices shown, 15 images]
[im 13/168  mediastinal]
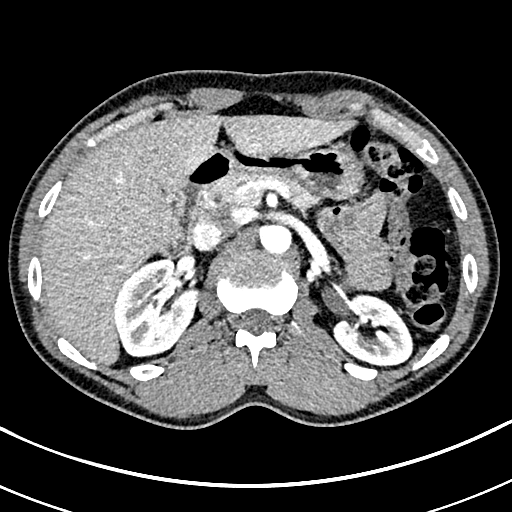
[im 13/168  lung]
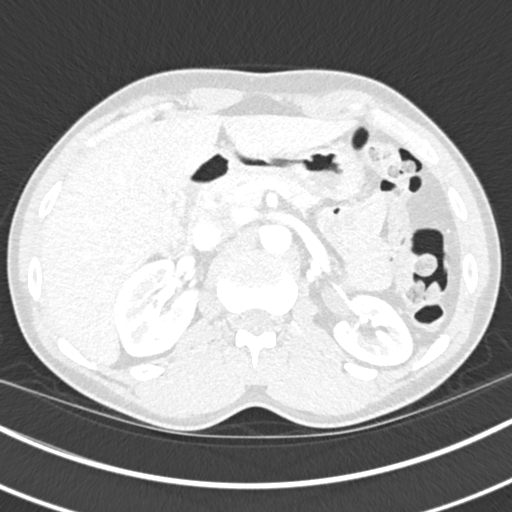
[im 26/168  lung]
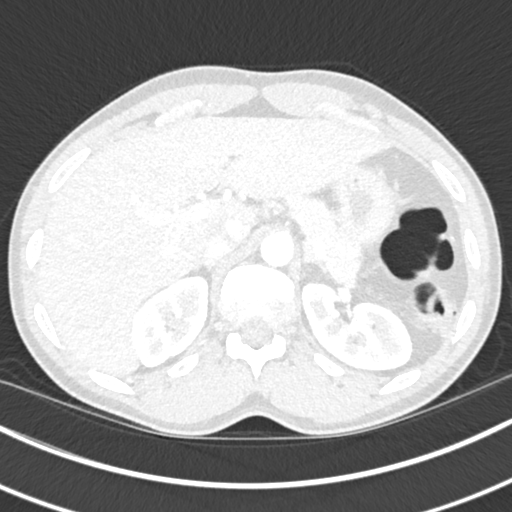
[im 39/168  lung]
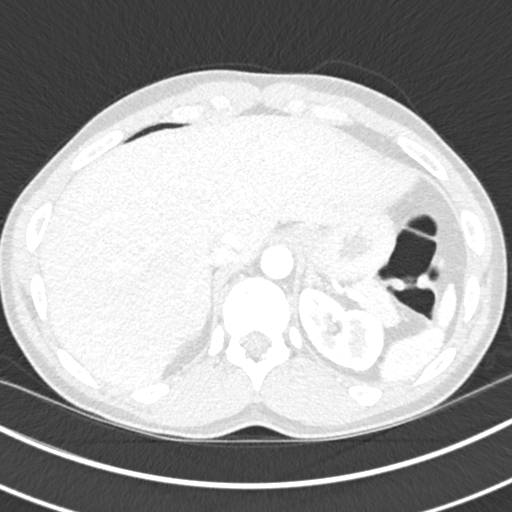
[im 52/168  lung]
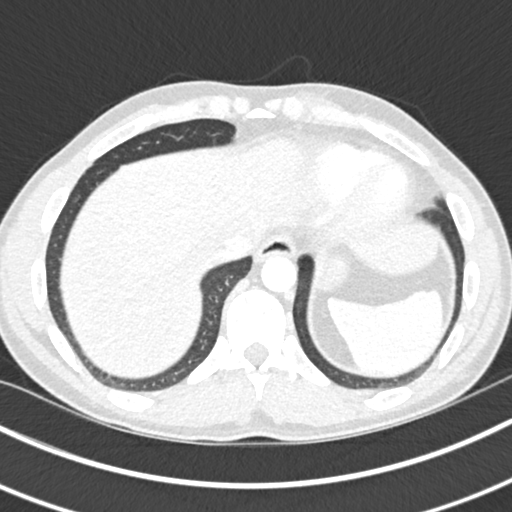
[im 65/168  mediastinal]
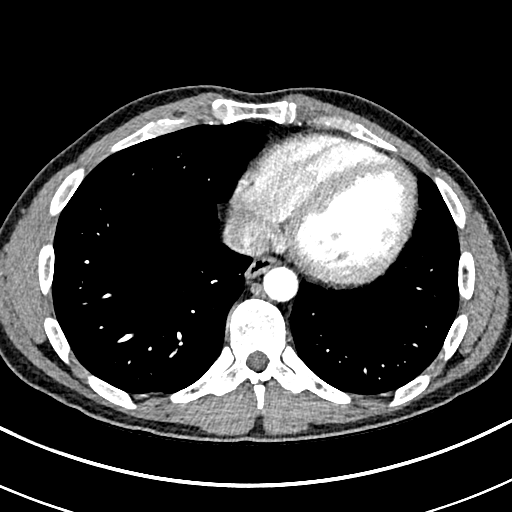
[im 65/168  lung]
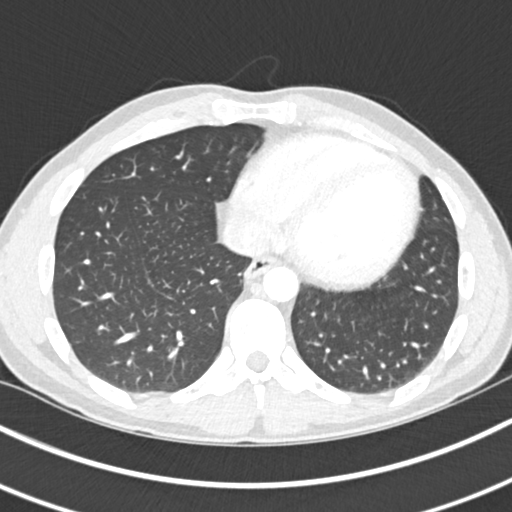
[im 78/168  lung]
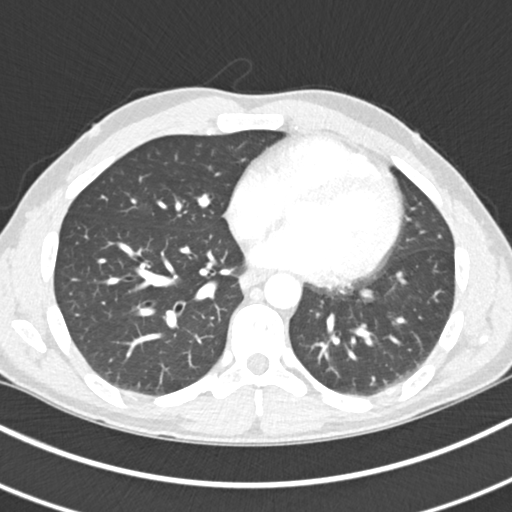
[im 90/168  lung]
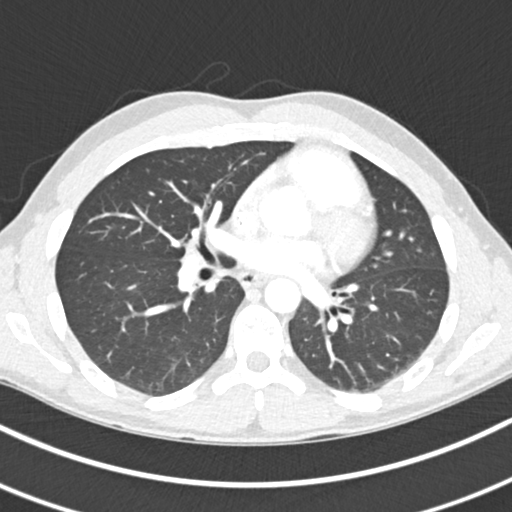
[im 103/168  lung]
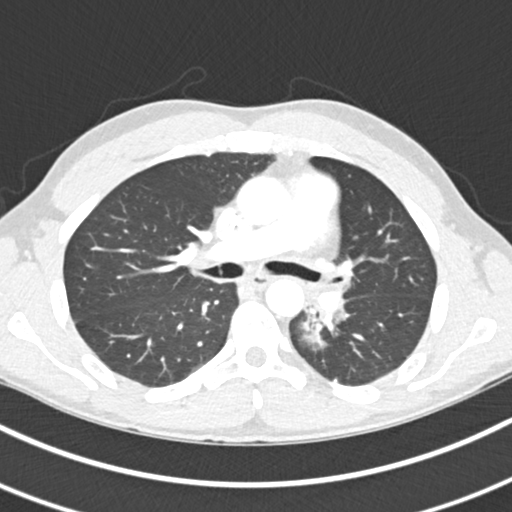
[im 116/168  mediastinal]
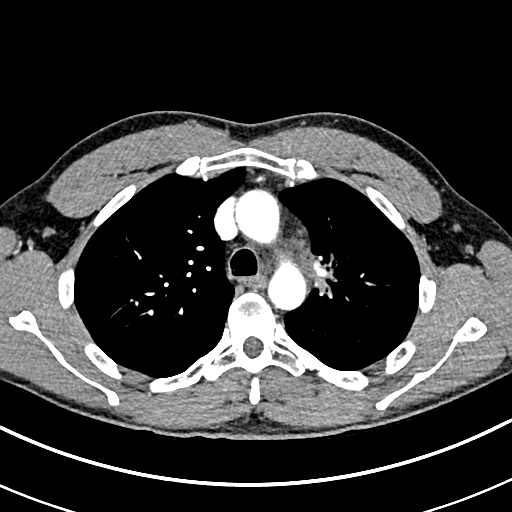
[im 116/168  lung]
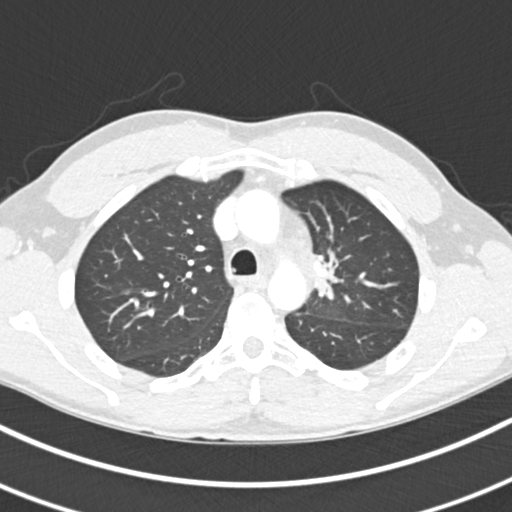
[im 129/168  lung]
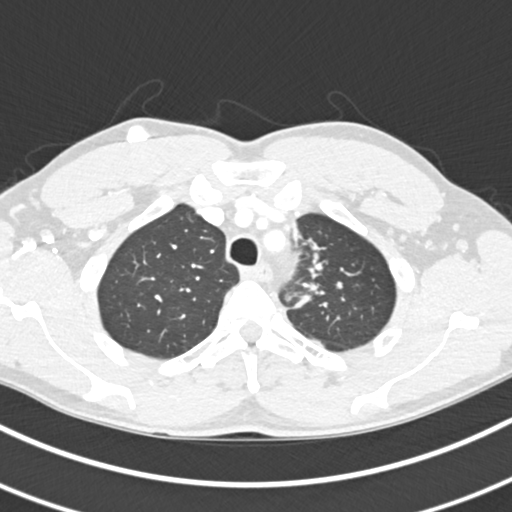
[im 142/168  lung]
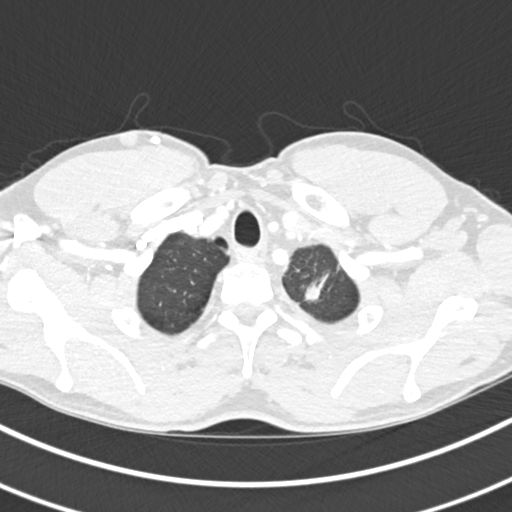
[im 155/168  lung]
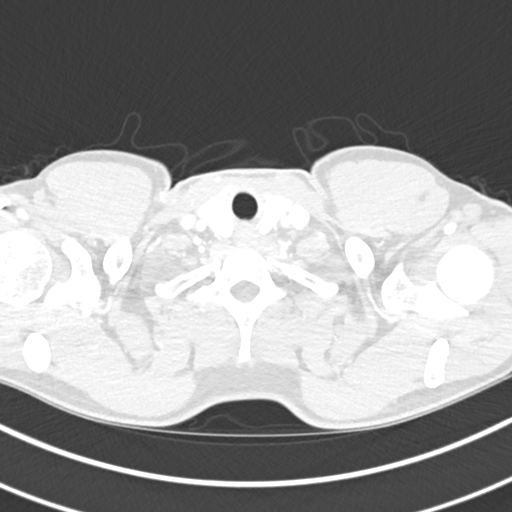

[Series 4: coronal chest 2.00 cor · coronal · 0.66mm/px · 3 of 124 slices shown]
[im 25/124  lung]
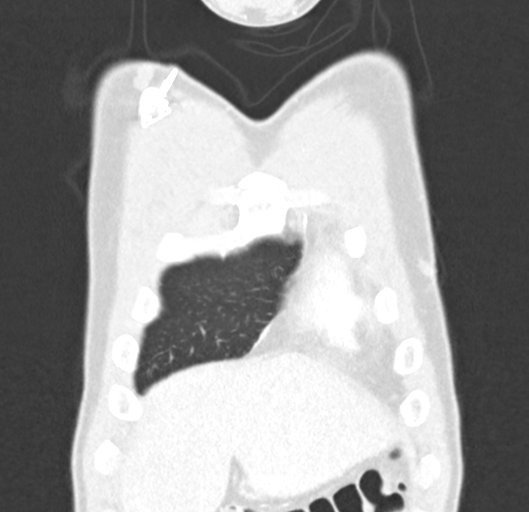
[im 50/124  lung]
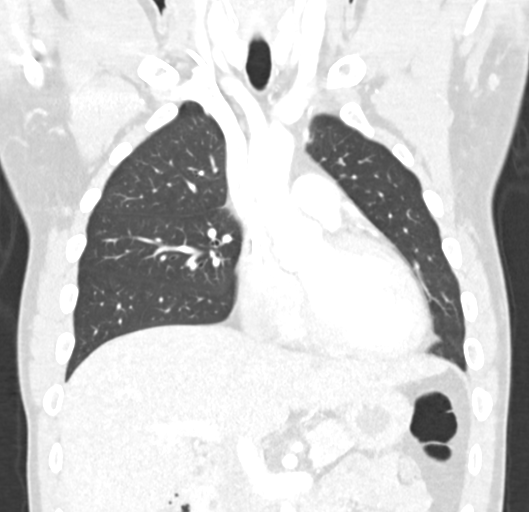
[im 74/124  lung]
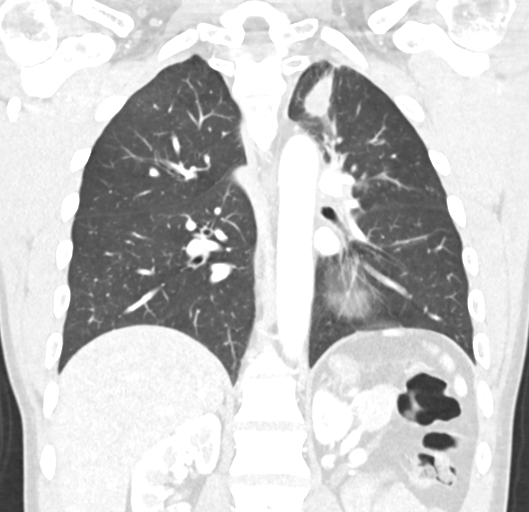

[15 of 36 positions shown; findings below may reference images not displayed]

FINDINGS: Cardiovascular: The heart size appears normal. No pericardial
effusion identified.

Mediastinum/Nodes: Normal appearance of the thyroid gland. The
trachea appears patent and is midline. Normal appearance of the
esophagus. The previous pre-vascular and AP window soft tissue
measures 2.1 x 1.3 cm, image 54/2. Previously 3.0 by 2.2 cm. No
contralateral mediastinal or hilar adenopathy. Left supraclavicular
lymph node has a short axis of 1.1 cm, image [DATE]. Previously this
measured 0.5 cm.

Lungs/Pleura: Left upper lobe lesion measures 1.4 x 1.0 by 2.8 cm,
image 32/2 and image 76/4. Previously (by my measurements this
measured 1.7 x 1.3 by 3.0 cm. The patchy sub solid density in the
superior segment of left lower lobe is less conspicuous and smaller
on today's study, likely postinflammatory, image 58/3. New nodule
within the left upper lobe lateral to the dominant index lesion
measures 4 mm and is new from previous exam, nonspecific unchanged 4
mm nodule within the superior segment of right lower lobe.

Upper Abdomen: No acute abnormality noted within the imaged portions
of the upper abdomen.

Musculoskeletal: No chest wall abnormality. No acute or significant
osseous findings.
IMPRESSION: 1. Overall, there has been interval response to therapy. The
previous index lesion within the left upper lobe has decreased in
size in the interval. The pre-vascular and AP window soft tissue has
also decreased in size in the interval.
2. There is a new 4 mm nodule within the left upper lobe lateral to
the dominant index lesion. Nonspecific attention on follow-up
imaging is advised.
3. Left supraclavicular lymph node has a short axis of 1.1 cm and
has increased in size in the interval. Previously this measured
cm. Although nonspecific this warrants close attention on follow-up
imaging. If immediate further evaluation is clinically indicated
this could be sampled under ultrasound guidance.
4. Decrease in size of previous sub solid nodule in the superior
segment of left lower lobe which is likely post inflammatory in
etiology.

## 2020-06-11 MED ORDER — IOHEXOL 300 MG/ML  SOLN
75.0000 mL | Freq: Once | INTRAMUSCULAR | Status: AC | PRN
  Administered 2020-06-11: 75 mL via INTRAVENOUS

## 2020-06-12 ENCOUNTER — Inpatient Hospital Stay: Payer: 59 | Admitting: Oncology

## 2020-06-12 ENCOUNTER — Inpatient Hospital Stay: Payer: 59

## 2020-06-12 ENCOUNTER — Inpatient Hospital Stay: Payer: 59 | Attending: Oncology

## 2020-06-12 ENCOUNTER — Encounter: Payer: Self-pay | Admitting: Oncology

## 2020-06-12 VITALS — BP 135/91 | Temp 98.7°F | Resp 20 | Wt 182.1 lb

## 2020-06-12 DIAGNOSIS — Z95828 Presence of other vascular implants and grafts: Secondary | ICD-10-CM

## 2020-06-12 DIAGNOSIS — Z79899 Other long term (current) drug therapy: Secondary | ICD-10-CM | POA: Diagnosis not present

## 2020-06-12 DIAGNOSIS — M542 Cervicalgia: Secondary | ICD-10-CM | POA: Diagnosis not present

## 2020-06-12 DIAGNOSIS — C3412 Malignant neoplasm of upper lobe, left bronchus or lung: Secondary | ICD-10-CM

## 2020-06-12 DIAGNOSIS — C782 Secondary malignant neoplasm of pleura: Secondary | ICD-10-CM | POA: Diagnosis not present

## 2020-06-12 DIAGNOSIS — M25512 Pain in left shoulder: Secondary | ICD-10-CM | POA: Insufficient documentation

## 2020-06-12 DIAGNOSIS — Z5112 Encounter for antineoplastic immunotherapy: Secondary | ICD-10-CM

## 2020-06-12 DIAGNOSIS — Z87891 Personal history of nicotine dependence: Secondary | ICD-10-CM | POA: Diagnosis not present

## 2020-06-12 LAB — CBC WITH DIFFERENTIAL/PLATELET
Abs Immature Granulocytes: 0.01 10*3/uL (ref 0.00–0.07)
Basophils Absolute: 0 10*3/uL (ref 0.0–0.1)
Basophils Relative: 1 %
Eosinophils Absolute: 0.3 10*3/uL (ref 0.0–0.5)
Eosinophils Relative: 5 %
HCT: 40.8 % (ref 39.0–52.0)
Hemoglobin: 13.6 g/dL (ref 13.0–17.0)
Immature Granulocytes: 0 %
Lymphocytes Relative: 32 %
Lymphs Abs: 1.7 10*3/uL (ref 0.7–4.0)
MCH: 30.4 pg (ref 26.0–34.0)
MCHC: 33.3 g/dL (ref 30.0–36.0)
MCV: 91.1 fL (ref 80.0–100.0)
Monocytes Absolute: 0.5 10*3/uL (ref 0.1–1.0)
Monocytes Relative: 10 %
Neutro Abs: 2.7 10*3/uL (ref 1.7–7.7)
Neutrophils Relative %: 52 %
Platelets: 356 10*3/uL (ref 150–400)
RBC: 4.48 MIL/uL (ref 4.22–5.81)
RDW: 13.1 % (ref 11.5–15.5)
WBC: 5.1 10*3/uL (ref 4.0–10.5)
nRBC: 0 % (ref 0.0–0.2)

## 2020-06-12 LAB — COMPREHENSIVE METABOLIC PANEL
ALT: 27 U/L (ref 0–44)
AST: 23 U/L (ref 15–41)
Albumin: 3.9 g/dL (ref 3.5–5.0)
Alkaline Phosphatase: 105 U/L (ref 38–126)
Anion gap: 11 (ref 5–15)
BUN: 16 mg/dL (ref 6–20)
CO2: 25 mmol/L (ref 22–32)
Calcium: 9 mg/dL (ref 8.9–10.3)
Chloride: 101 mmol/L (ref 98–111)
Creatinine, Ser: 0.96 mg/dL (ref 0.61–1.24)
GFR calc Af Amer: >60 mL/min (ref >60)
GFR calc non Af Amer: >60 mL/min (ref >60)
Glucose, Bld: 138 mg/dL — ABNORMAL HIGH (ref 70–99)
Potassium: 3.7 mmol/L (ref 3.5–5.1)
Sodium: 137 mmol/L (ref 135–145)
Total Bilirubin: 0.6 mg/dL (ref 0.3–1.2)
Total Protein: 8.1 g/dL (ref 6.5–8.1)

## 2020-06-12 MED ORDER — SODIUM CHLORIDE 0.9% FLUSH
10.0000 mL | INTRAVENOUS | Status: DC | PRN
  Administered 2020-06-12: 10 mL via INTRAVENOUS
  Filled 2020-06-12: qty 10

## 2020-06-12 MED ORDER — SODIUM CHLORIDE 0.9 % IV SOLN
200.0000 mg | Freq: Once | INTRAVENOUS | Status: AC
  Administered 2020-06-12: 200 mg via INTRAVENOUS
  Filled 2020-06-12: qty 8

## 2020-06-12 MED ORDER — SODIUM CHLORIDE 0.9 % IV SOLN
Freq: Once | INTRAVENOUS | Status: AC
  Filled 2020-06-12: qty 250

## 2020-06-12 MED ORDER — HEPARIN SOD (PORK) LOCK FLUSH 100 UNIT/ML IV SOLN
500.0000 [IU] | Freq: Once | INTRAVENOUS | Status: AC | PRN
  Administered 2020-06-12: 500 [IU]
  Filled 2020-06-12: qty 5

## 2020-06-12 MED ORDER — HEPARIN SOD (PORK) LOCK FLUSH 100 UNIT/ML IV SOLN
INTRAVENOUS | Status: AC
  Filled 2020-06-12: qty 5

## 2020-06-15 ENCOUNTER — Telehealth: Payer: Self-pay | Admitting: *Deleted

## 2020-06-15 NOTE — Telephone Encounter (Signed)
Pt called to inform that his shoulder is in severe pain and he would like to see if his shoulder MRI could be moved sooner. Per scheduling, the soonest appt would be for 7/2. Pt made aware and he stated that he cannot wait that long and is going to [facility] for further management and hopefully imaging.   Pt informed to give [place] a call back if he wishes to cancel his MRI on 7/2 and to let us know if he needs anything else. Pt verbalized understanding. Nothing further needed at this time.

## 2020-06-16 NOTE — Progress Notes (Signed)
Hematology/Oncology Consult note [facility]  [phone] [phone]  Patient Care Team: Default, Provider, MD as PCP - General Telford Nab, RN as Registered Nurse Sindy Guadeloupe, MD as Consulting Physician (Hematology and Oncology) Sindy Guadeloupe, MD as Consulting Physician (Hematology and Oncology)   Name of the patient: Kyle Guerrero  [id number]  1970/12/30   Date of visit: 06/16/20  Diagnosis- Non-small cell lung cancer stage IV acT2 cN2 cM1 a with pleural involvement  Chief complaint/ Reason for visit-on treatment assessment prior to cycle 4 of maintenance Keytruda  Heme/Onc history: patient is a 49 year old male who presented to the ER with symptoms ofheaviness in his chest and upper left chest discomfort he underwent CT angio chest to rule out PE which showed 3.8 x 3.3 cm left upper palpable lung mass along with 4.4 x 3.3 cm lobulated mass in the aortopulmonary window and 2.6 x 2.4 cm left hilar mass all concerning for malignancy. Patient has also seen pulmonary and has been set up for bronchoscopy and EBUS guided biopsy on 11/23/2019. Patient underwent. PET CT scan which showed a hypermetabolic spiculated 3.5 cm of 5 left upper lobe lung mass, adjacent hypermetabolic 3.2 x 1.2 cm pleural metastases in the medial posterior by the left pleural space along with scalloping of the adjacent posterior left third rib. Hypermetabolic infiltrative left perihilar conglomerate nodal metastases measuring up to 7.3 x 3.6 cm and 0.8 cm high left mediastinal node between the left brachiocephalic vein and left subclavian artery. No evidence of distant metastatic disease  Biopsy showed non-small cell lung cancer but further characterization could not be determined. Insufficient tissue for NGS testing. Repeat biopsy done. Results of NGStestingshowed PD-L1 50%. Tumor mutational burden high. 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   Interval history-patient reports having continued pain in his left shoulder.  He is unable to be lift his left arm over his head.  Reports significant pain on overhead abduction  ECOG PS- 0 Pain scale- 5 Opioid associated constipation- no  Review of systems- Review of Systems  Constitutional: Negative for chills, fever, malaise/fatigue and weight loss.  HENT: Negative for congestion, ear discharge and nosebleeds.   Eyes: Negative for blurred vision.  Respiratory: Negative for cough, hemoptysis, sputum production, shortness of breath and wheezing.   Cardiovascular: Negative for chest pain, palpitations, orthopnea and claudication.  Gastrointestinal: Negative for abdominal pain, blood in stool, constipation, diarrhea, heartburn, melena, nausea and vomiting.  Genitourinary: Negative for dysuria, flank pain, frequency, hematuria and urgency.  Musculoskeletal: Negative for back pain, joint pain and myalgias.       Left shoulder pain  Skin: Negative for rash.  Neurological: Negative for dizziness, tingling, focal weakness, seizures, weakness and headaches.  Endo/Heme/Allergies: Does not bruise/bleed easily.  Psychiatric/Behavioral: Negative for depression and suicidal ideas. The patient does not have insomnia.       No Known Allergies   Past Medical History:  Diagnosis Date  . Asthma   . Lung cancer [facility])      Past Surgical History:  Procedure Laterality Date  . CYST EXCISION    . IR IMAGING GUIDED PORT INSERTION  12/05/2019  . VIDEO BRONCHOSCOPY WITH ENDOBRONCHIAL ULTRASOUND Left 11/22/2019   Procedure: VIDEO BRONCHOSCOPY WITH ENDOBRONCHIAL ULTRASOUND;  Surgeon: Tyler Pita, MD;  Location: [facility] ORS;  Service: Thoracic;  Laterality: Left;    Social History   Socioeconomic History  . Marital status: Single    Spouse name: Not  on file  . Number of children: Not on file  . Years of education: Not on file  . Highest education level: Not  on file  Occupational History  . Not on file  Tobacco Use  . Smoking status: Former Smoker    Packs/day: 2.00    Types: Cigarettes    Quit date: 11/08/2019    Years since quitting: 0.6  . Smokeless tobacco: Never Used  Vaping Use  . Vaping Use: Never used  Substance and Sexual Activity  . Alcohol use: Not Currently  . Drug use: Yes    Types: Marijuana  . Sexual activity: Not on file  Other Topics Concern  . Not on file  Social History Narrative  . Not on file   Social Determinants of Health   Financial Resource Strain:   . Difficulty of Paying Living Expenses:   Food Insecurity:   . Worried About Charity fundraiser in the Last Year:   . Arboriculturist in the Last Year:   Transportation Needs:   . Film/video editor (Medical):   Marland Kitchen Lack of Transportation (Non-Medical):   Physical Activity:   . Days of Exercise per Week:   . Minutes of Exercise per Session:   Stress:   . Feeling of Stress :   Social Connections:   . Frequency of Communication with Friends and Family:   . Frequency of Social Gatherings with Friends and Family:   . Attends Religious Services:   . Active Member of Clubs or Organizations:   . Attends Archivist Meetings:   Marland Kitchen Marital Status:   Intimate Partner Violence:   . Fear of Current or Ex-Partner:   . Emotionally Abused:   Marland Kitchen Physically Abused:   . Sexually Abused:     Family History  Problem Relation Age of Onset  . Healthy Mother   . Healthy Father      Current Outpatient Medications:  .  cyclobenzaprine (FLEXERIL) 5 MG tablet, Take 5 mg by mouth 3 (three) times daily as needed., Disp: , Rfl:  .  etodolac (LODINE) 500 MG tablet, Take 500 mg by mouth 2 (two) times daily., Disp: , Rfl:  .  sucralfate (CARAFATE) 1 g tablet, Take 1 tablet (1 g total) by mouth 3 (three) times daily before meals. Dissolve in warm water, swish and swallow, Disp: 90 tablet, Rfl: 6 .  azelastine (ASTELIN) 0.1 % nasal spray, Place 1 spray into the  nose as needed. , Disp: , Rfl:  No current facility-administered medications for this visit.  Facility-Administered Medications Ordered in Other Visits:  .  sodium chloride flush (NS) 0.9 % injection 10 mL, 10 mL, Intravenous, PRN, Sindy Guadeloupe, MD, 10 mL at 05/01/20 0900  Physical exam:  Vitals:   06/12/20 0904  BP: (!) 135/91  Resp: 20  Temp: 98.7 F (37.1 C)  SpO2: 100%  Weight: 182 lb 1.6 oz (82.6 kg)   Physical Exam Constitutional:      General: He is not in acute distress. Cardiovascular:     Rate and Rhythm: Normal rate and regular rhythm.     Heart sounds: Normal heart sounds.  Pulmonary:     Effort: Pulmonary effort is normal.     Breath sounds: Normal breath sounds.  Abdominal:     General: Bowel sounds are normal.     Palpations: Abdomen is soft.  Musculoskeletal:     Comments: Limitation over arm abduction  Skin:    General: Skin is  warm and dry.  Neurological:     Mental Status: He is alert and oriented to person, place, and time.      CMP Latest Ref Rng & Units 06/12/2020  Glucose 70 - 99 mg/dL 138(H)  BUN 6 - 20 mg/dL 16  Creatinine 0.61 - 1.24 mg/dL 0.96  Sodium 135 - 145 mmol/L 137  Potassium 3.5 - 5.1 mmol/L 3.7  Chloride 98 - 111 mmol/L 101  CO2 22 - 32 mmol/L 25  Calcium 8.9 - 10.3 mg/dL 9.0  Total Protein 6.5 - 8.1 g/dL 8.1  Total Bilirubin 0.3 - 1.2 mg/dL 0.6  Alkaline Phos 38 - 126 U/L 105  AST 15 - 41 U/L 23  ALT 0 - 44 U/L 27   CBC Latest Ref Rng & Units 06/12/2020  WBC 4.0 - 10.5 K/uL 5.1  Hemoglobin 13.0 - 17.0 g/dL 13.6  Hematocrit 39 - 52 % 40.8  Platelets 150 - 400 K/uL 356    No images are attached to the encounter.  CT Chest W Contrast  Result Date: 06/11/2020 CLINICAL DATA:  Restaging non-small cell lung cancer. EXAM: CT CHEST WITH CONTRAST TECHNIQUE: Multidetector CT imaging of the chest was performed during intravenous contrast administration. CONTRAST:  10m OMNIPAQUE IOHEXOL 300 MG/ML  SOLN COMPARISON:  03/24/2020  FINDINGS: Cardiovascular: The heart size appears normal. No pericardial effusion identified. Mediastinum/Nodes: Normal appearance of the thyroid gland. The trachea appears patent and is midline. Normal appearance of the esophagus. The previous pre-vascular and AP window soft tissue measures 2.1 x 1.3 cm, image 54/2. Previously 3.0 by 2.2 cm. No contralateral mediastinal or hilar adenopathy. Left supraclavicular lymph node has a short axis of 1.1 cm, image 20/2. Previously this measured 0.5 cm. Lungs/Pleura: Left upper lobe lesion measures 1.4 x 1.0 by 2.8 cm, image 32/2 and image 76/4. Previously (by my measurements this measured 1.7 x 1.3 by 3.0 cm. The patchy sub solid density in the superior segment of left lower lobe is less conspicuous and smaller on today's study, likely postinflammatory, image 58/3. New nodule within the left upper lobe lateral to the dominant index lesion measures 4 mm and is new from previous exam, nonspecific unchanged 4 mm nodule within the superior segment of right lower lobe. Upper Abdomen: No acute abnormality noted within the imaged portions of the upper abdomen. Musculoskeletal: No chest wall abnormality. No acute or significant osseous findings. IMPRESSION: 1. Overall, there has been interval response to therapy. The previous index lesion within the left upper lobe has decreased in size in the interval. The pre-vascular and AP window soft tissue has also decreased in size in the interval. 2. There is a new 4 mm nodule within the left upper lobe lateral to the dominant index lesion. Nonspecific attention on follow-up imaging is advised. 3. Left supraclavicular lymph node has a short axis of 1.1 cm and has increased in size in the interval. Previously this measured 0.5 cm. Although nonspecific this warrants close attention on follow-up imaging. If immediate further evaluation is clinically indicated this could be sampled under ultrasound guidance. 4. Decrease in size of previous sub  solid nodule in the superior segment of left lower lobe which is likely post inflammatory in etiology. Electronically Signed   By: TKerby MoorsM.D.   On: 06/11/2020 14:19     Assessment and plan- Patient is a 49y.o. male with stage IV adenocarcinoma of the lung with pleural metastases.  He is here for on treatment assessment prior to cycle 4 of  maintenance Keytruda  1.  Lung cancer: Counts okay to proceed with cycle 4 of maintenance Keytruda today.  I will see him back in 3 weeks for cycle 5.He underwent CT chest with contrast with Dr. Donella Stade which showed continued response to treatment with reduction in the size of the left upper lobe lesion.  Reduction in the size of the left supraclavicular lymph node.  Soft tissue density in the prevascular and AP window had also decreased.  Plan is to continue Keytruda until progression or toxicity  2.  Left shoulder pain: Etiology likely musculoskeletal.  Patient has seen orthopedics as well but is yet to undergo MRI.  We will go ahead and schedule MRI of the left shoulder at this time.   Visit Diagnosis 1. Malignant neoplasm of upper lobe of left lung ([place])   2. Neck pain   3. Pain in joint of left shoulder   4. Encounter for antineoplastic immunotherapy      Dr. Randa Evens, MD, MPH [facility] [phone] 06/16/2020 8:30 AM

## 2020-06-16 NOTE — Telephone Encounter (Signed)
Referral faxed to [facility] per pt request. Dr. Janese Banks made aware and in agreement.

## 2020-06-17 ENCOUNTER — Other Ambulatory Visit: Payer: Self-pay

## 2020-06-17 ENCOUNTER — Encounter: Payer: Self-pay | Admitting: Radiation Oncology

## 2020-06-17 ENCOUNTER — Ambulatory Visit
Admission: RE | Admit: 2020-06-17 | Discharge: 2020-06-17 | Disposition: A | Discharge status: Home / Self Care | Payer: 59 | Source: Ambulatory Visit | Attending: Radiation Oncology | Admitting: Radiation Oncology

## 2020-06-17 VITALS — BP 144/96 | HR 89 | Temp 89.0°F | Resp 16 | Wt 181.5 lb

## 2020-06-17 DIAGNOSIS — Z9221 Personal history of antineoplastic chemotherapy: Secondary | ICD-10-CM | POA: Diagnosis not present

## 2020-06-17 DIAGNOSIS — Z923 Personal history of irradiation: Secondary | ICD-10-CM | POA: Diagnosis not present

## 2020-06-17 DIAGNOSIS — C3412 Malignant neoplasm of upper lobe, left bronchus or lung: Secondary | ICD-10-CM | POA: Diagnosis present

## 2020-06-17 NOTE — Progress Notes (Signed)
Radiation Oncology Follow up Note  Name: Kyle Guerrero   Date:   06/17/2020 MRN:  [id number] DOB: September 15, 1971    This 49 y.o. male presents to the clinic today for 64-month follow-up status post concurrent chemoradiation therapy for stage IV (T2 N2 M1 with pleural involvement we treated as a stage IIIb non-small cell lung cancer.  REFERRING PROVIDER: No ref. provider found  HPI: Patient is a 49 year old male now at 4 months having completed concurrent chemoradiation therapy for stage IVa non-small cell lung cancer of the left lung.  He is seen today in routine follow-up and is doing fairly well he has what he believes is a rotator cuff tear in his left shoulder from exercising.  He has a slight cough nonproductive no specific shortness of breath dyspnea on exertion.  Patient had a recent CT scan.  Showing interval response to therapy with index lesion left upper lobe decreased in size.  The prevascular AP window soft tissue is also decreased in size.  Of note there is a new 4 mm nodule in the upper left lobe lateral to the dominant insert index which we will follow.  Also a left supraclavicular lymph node has increased in size from 0.5 to 1 cm nonspecific again will follow.  Patient is currently on Keytruda which he is tolerating well.  COMPLICATIONS OF TREATMENT: none  FOLLOW UP COMPLIANCE: keeps appointments   PHYSICAL EXAM:  BP (!) 144/96 (BP Location: Left Arm, Patient Position: Sitting, Cuff Size: Normal)   Pulse 89   Temp (!) 89 F (31.7 C) (Tympanic)   Resp 16   Wt 181 lb 8 oz (82.3 kg)   BMI 25.31 kg/m  Well-developed well-nourished patient in NAD. HEENT reveals PERLA, EOMI, discs not visualized.  Oral cavity is clear. No oral mucosal lesions are identified. Neck is clear without evidence of cervical or supraclavicular adenopathy. Lungs are clear to A&P. Cardiac examination is essentially unremarkable with regular rate and rhythm without murmur rub or thrill. Abdomen is benign with  no organomegaly or masses noted. Motor sensory and DTR levels are equal and symmetric in the upper and lower extremities. Cranial nerves II through XII are grossly intact. Proprioception is intact. No peripheral adenopathy or edema is identified. No motor or sensory levels are noted. Crude visual fields are within normal range.  RADIOLOGY RESULTS: CT scans reviewed compatible with above-stated findings  PLAN: Present time patient is doing well.  CT scans reviewed as stated above we will continue surveillance have asked to see him back in 6 months for follow-up.  He continues follow-up and treatment with medical oncology with immunotherapy.  He is scheduled to have an MRI of his left shoulder although I do believe this is musculoskeletal in nature.  Patient knows to call with any concerns.  I would like to take this opportunity to thank you for allowing me to participate in the care of your patient.Noreene Filbert, MD

## 2020-06-26 ENCOUNTER — Ambulatory Visit
Admission: RE | Admit: 2020-06-26 | Discharge: 2020-06-26 | Disposition: A | Discharge status: Home / Self Care | Payer: 59 | Source: Ambulatory Visit | Attending: Oncology | Admitting: Oncology

## 2020-06-26 ENCOUNTER — Other Ambulatory Visit: Payer: Self-pay

## 2020-06-26 DIAGNOSIS — C3412 Malignant neoplasm of upper lobe, left bronchus or lung: Secondary | ICD-10-CM | POA: Diagnosis present

## 2020-06-26 DIAGNOSIS — M542 Cervicalgia: Secondary | ICD-10-CM

## 2020-06-26 DIAGNOSIS — M25512 Pain in left shoulder: Secondary | ICD-10-CM | POA: Diagnosis present

## 2020-06-26 IMAGING — MR MR SHOULDER*L* WO/W CM
8 series · 37 of 40 positions shown · IV contrast (gadavist)
Comparison: CT chest dated [DATE].

CLINICAL DATA: Left-sided neck pain radiating to the shoulder.
History of lung cancer.

EXAM:
MRI OF THE LEFT SHOULDER WITHOUT AND WITH CONTRAST
TECHNIQUE: Multiplanar, multisequence MR imaging of the left shoulder shoulder
was performed before and after the administration of intravenous
contrast.
CONTRAST:  7.5mL GADAVIST GADOBUTROL 1 MMOL/ML IV SOLN

[Series 5: PD fat-sat · axial · left · 4.0mm · 0.55mm/px · z∈[-10,+119]mm · 6 of 28 slices shown (1 of 2)]
[im 1/28]
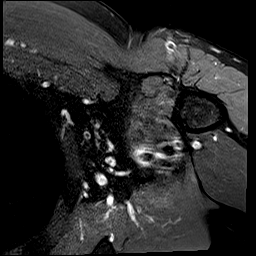
[im 6/28]
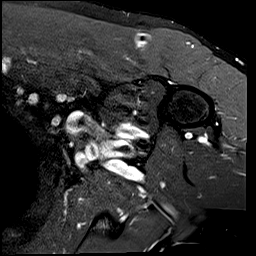
[im 11/28]
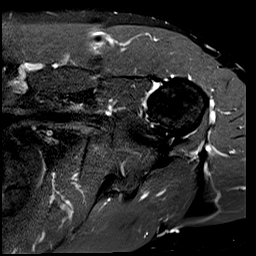
[im 17/28]
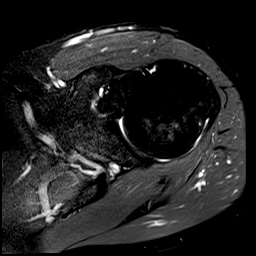
[im 22/28]
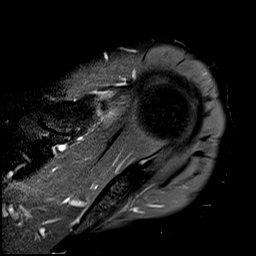
[im 28/28]
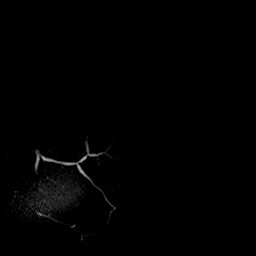

[Series 6: T2 fat-sat · oblique · left · 4.0mm · 0.23mm/px · 5 of 22 slices shown (1 of 2)]
[im 1/22]
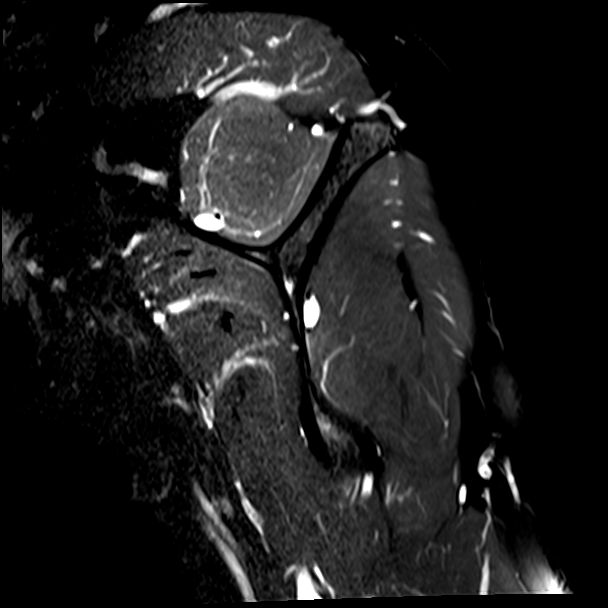
[im 6/22]
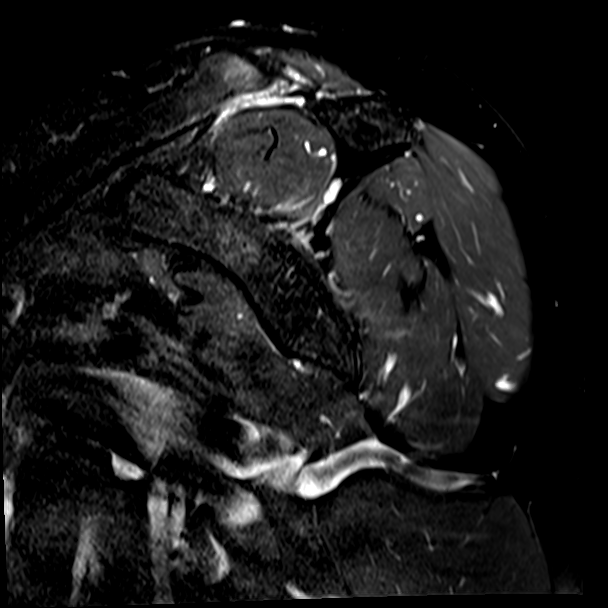
[im 11/22]
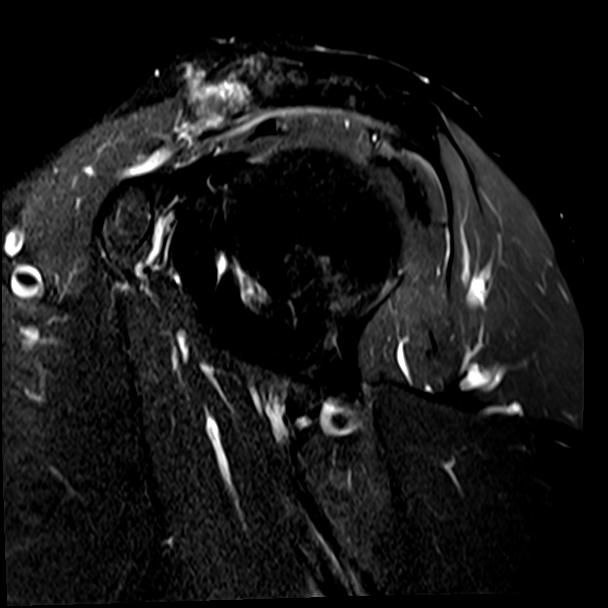
[im 16/22]
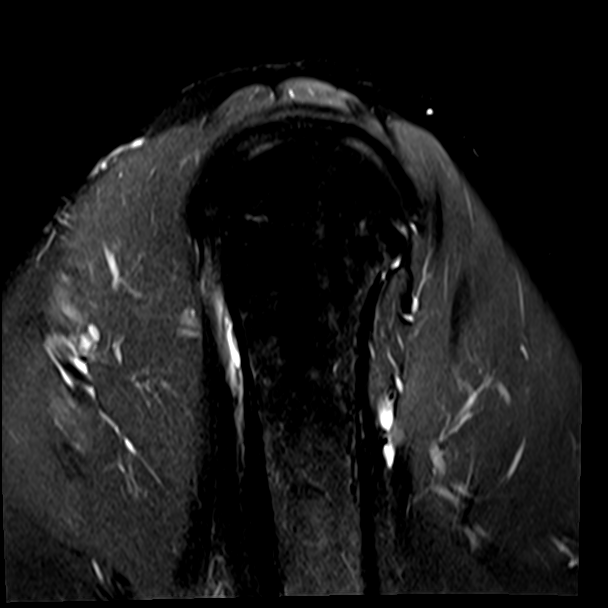
[im 22/22]
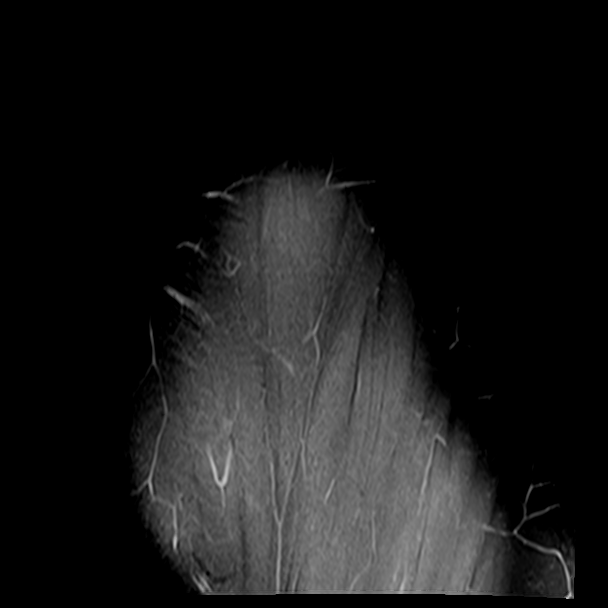

[Series 7: T1 · oblique · left · 4.0mm · 0.36mm/px · 5 of 22 slices shown]
[im 1/22]
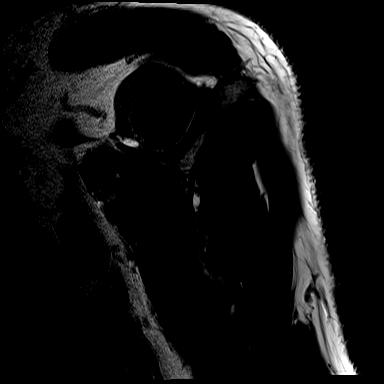
[im 6/22]
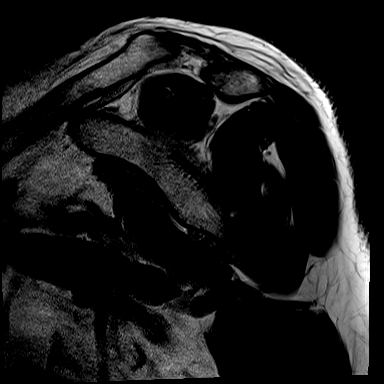
[im 11/22]
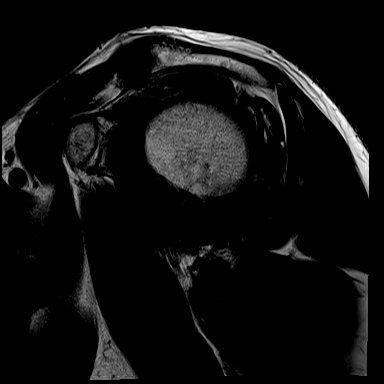
[im 16/22]
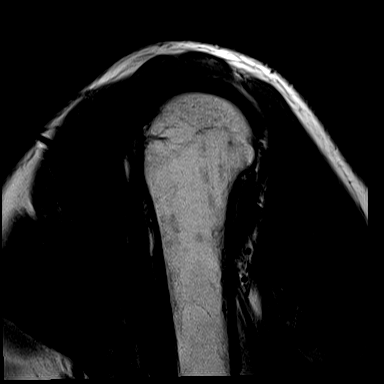
[im 22/22]
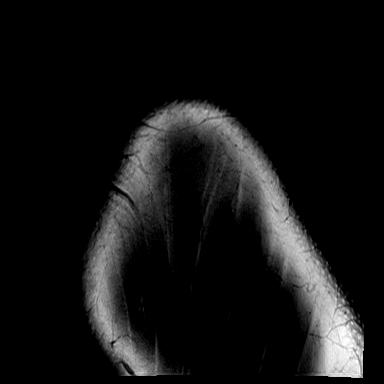

[Series 8: PD fat-sat · oblique · left · 4.0mm · 0.44mm/px · 4 of 20 slices shown (2 of 2)]
[im 1/20]
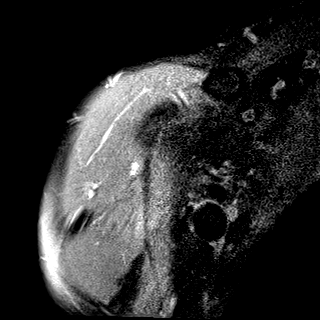
[im 7/20]
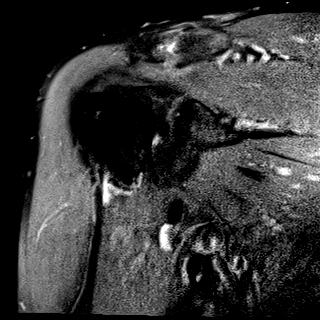
[im 13/20]
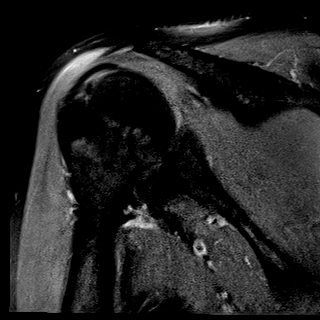
[im 20/20]
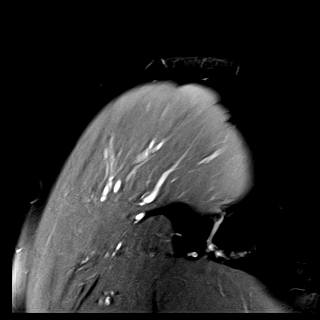

[Series 9: T2 fat-sat · oblique · left · 4.0mm · 0.44mm/px · 4 of 20 slices shown (2 of 2)]
[im 1/20]
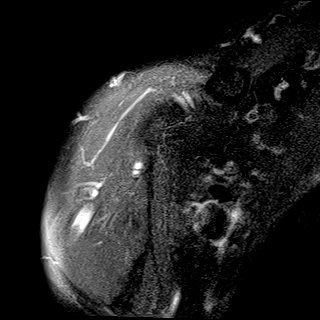
[im 7/20]
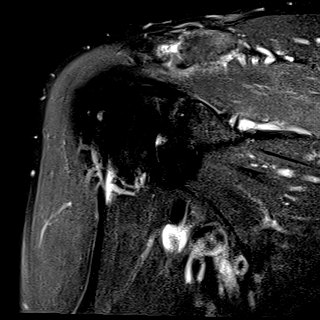
[im 13/20]
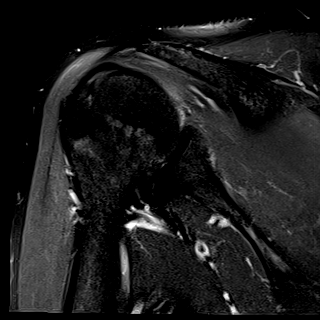
[im 20/20]
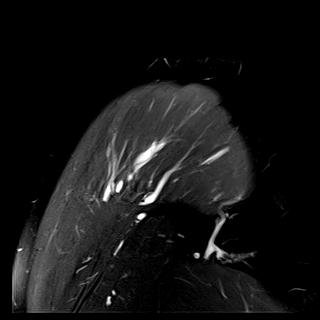

[Series 10: T1 fat-sat · axial · non-contrast · left · 4.0mm · 0.55mm/px · z∈[-10,+119]mm · 6 of 28 slices shown]
[im 1/28]
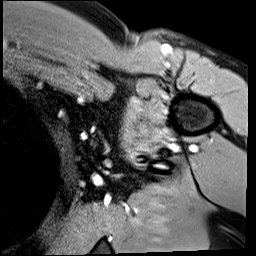
[im 6/28]
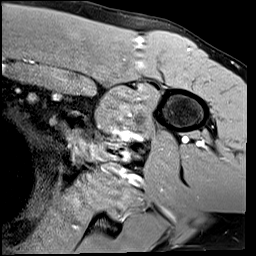
[im 11/28]
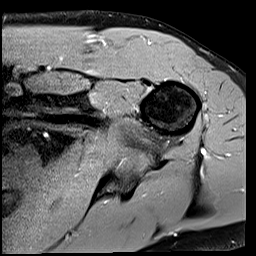
[im 17/28]
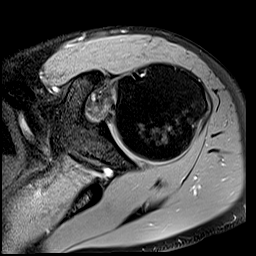
[im 22/28]
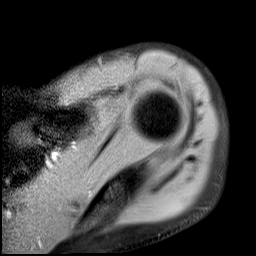
[im 28/28]
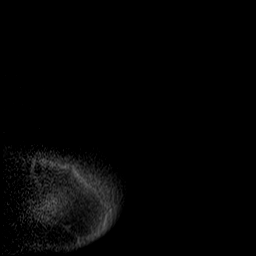

[Series 11: T1 fat-sat post-contrast · axial · left · 4.0mm · 0.55mm/px · z∈[-10,+119]mm · 6 of 28 slices shown (1 of 2)]
[im 1/28]
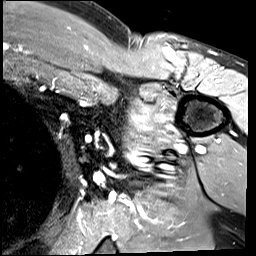
[im 6/28]
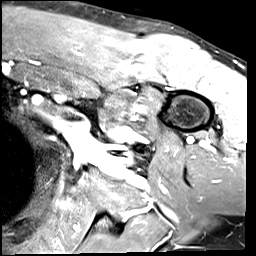
[im 11/28]
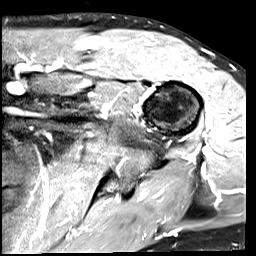
[im 17/28]
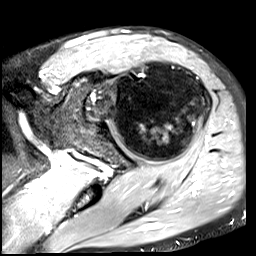
[im 22/28]
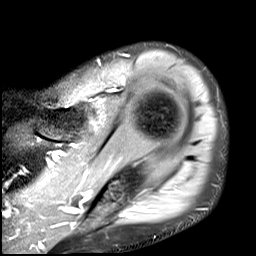
[im 28/28]
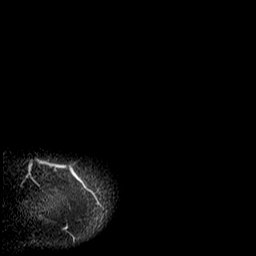

[Series 12: T1 fat-sat post-contrast · oblique · left · 4.0mm · 0.27mm/px · 1 of 20 slices shown (2 of 2)]
[im 1/20]
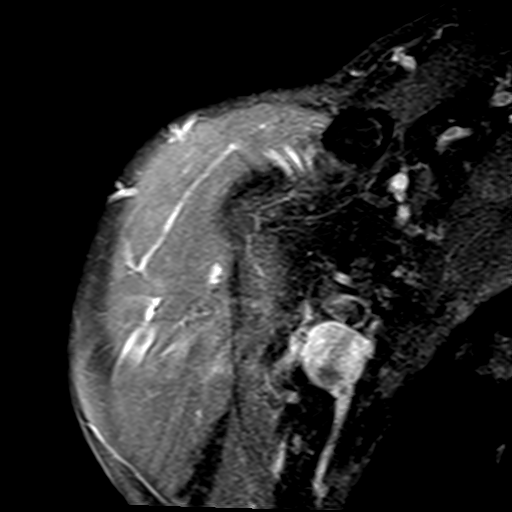

[37 of 40 positions shown; findings below may reference images not displayed]

FINDINGS: Rotator cuff: Small intrasubstance tear of the supraspinatus tendon
at the insertion. The infraspinatus, teres minor, and subscapularis
tendons are unremarkable.

Muscles: No atrophy or abnormal signal of the muscles of the rotator
cuff.

Biceps long head:  Intact and normally positioned.

Acromioclavicular Joint: Mild arthropathy of the acromioclavicular
joint. Type II acromion. No subacromial/subdeltoid bursal fluid.

Glenohumeral Joint: No joint effusion. No chondral defect.

Labrum: Grossly intact, but evaluation is limited by lack of
intraarticular fluid.

Bones:  No marrow abnormality, fracture or dislocation.

Other: None.
IMPRESSION: 1. Small intrasubstance tear of the supraspinatus tendon at the
insertion.
2. Mild acromioclavicular osteoarthritis.

## 2020-06-26 MED ORDER — GADOBUTROL 1 MMOL/ML IV SOLN
7.5000 mL | Freq: Once | INTRAVENOUS | Status: AC | PRN
  Administered 2020-06-26: 7.5 mL via INTRAVENOUS

## 2020-07-02 ENCOUNTER — Encounter: Payer: Self-pay | Admitting: Oncology

## 2020-07-02 NOTE — Progress Notes (Signed)
Patient called for oncology follow-up appointment, expresses complaints of shoulder pain from rotator cuff, arm in sling.

## 2020-07-03 ENCOUNTER — Inpatient Hospital Stay (HOSPITAL_BASED_OUTPATIENT_CLINIC_OR_DEPARTMENT_OTHER): Payer: 59 | Admitting: Oncology

## 2020-07-03 ENCOUNTER — Inpatient Hospital Stay: Payer: 59 | Attending: Oncology

## 2020-07-03 ENCOUNTER — Other Ambulatory Visit: Payer: Self-pay

## 2020-07-03 ENCOUNTER — Inpatient Hospital Stay: Payer: 59

## 2020-07-03 VITALS — BP 140/96 | HR 102 | Temp 96.5°F | Resp 20 | Wt 176.4 lb

## 2020-07-03 VITALS — BP 135/85 | HR 95 | Resp 20

## 2020-07-03 DIAGNOSIS — M25512 Pain in left shoulder: Secondary | ICD-10-CM

## 2020-07-03 DIAGNOSIS — Z5112 Encounter for antineoplastic immunotherapy: Secondary | ICD-10-CM | POA: Insufficient documentation

## 2020-07-03 DIAGNOSIS — C3412 Malignant neoplasm of upper lobe, left bronchus or lung: Secondary | ICD-10-CM

## 2020-07-03 DIAGNOSIS — Z87891 Personal history of nicotine dependence: Secondary | ICD-10-CM | POA: Diagnosis not present

## 2020-07-03 DIAGNOSIS — C782 Secondary malignant neoplasm of pleura: Secondary | ICD-10-CM | POA: Diagnosis not present

## 2020-07-03 DIAGNOSIS — Z79899 Other long term (current) drug therapy: Secondary | ICD-10-CM | POA: Insufficient documentation

## 2020-07-03 DIAGNOSIS — Z95828 Presence of other vascular implants and grafts: Secondary | ICD-10-CM

## 2020-07-03 DIAGNOSIS — J45909 Unspecified asthma, uncomplicated: Secondary | ICD-10-CM | POA: Insufficient documentation

## 2020-07-03 LAB — COMPREHENSIVE METABOLIC PANEL
ALT: 28 U/L (ref 0–44)
AST: 27 U/L (ref 15–41)
Albumin: 3.7 g/dL (ref 3.5–5.0)
Alkaline Phosphatase: 114 U/L (ref 38–126)
Anion gap: 11 (ref 5–15)
BUN: 12 mg/dL (ref 6–20)
CO2: 24 mmol/L (ref 22–32)
Calcium: 9 mg/dL (ref 8.9–10.3)
Chloride: 100 mmol/L (ref 98–111)
Creatinine, Ser: 0.87 mg/dL (ref 0.61–1.24)
GFR calc Af Amer: >60 mL/min (ref >60)
GFR calc non Af Amer: >60 mL/min (ref >60)
Glucose, Bld: 160 mg/dL — ABNORMAL HIGH (ref 70–99)
Potassium: 3.7 mmol/L (ref 3.5–5.1)
Sodium: 135 mmol/L (ref 135–145)
Total Bilirubin: 0.5 mg/dL (ref 0.3–1.2)
Total Protein: 8 g/dL (ref 6.5–8.1)

## 2020-07-03 LAB — CBC WITH DIFFERENTIAL/PLATELET
Abs Immature Granulocytes: 0.02 10*3/uL (ref 0.00–0.07)
Basophils Absolute: 0 10*3/uL (ref 0.0–0.1)
Basophils Relative: 0 %
Eosinophils Absolute: 0.1 10*3/uL (ref 0.0–0.5)
Eosinophils Relative: 1 %
HCT: 38 % — ABNORMAL LOW (ref 39.0–52.0)
Hemoglobin: 13 g/dL (ref 13.0–17.0)
Immature Granulocytes: 0 %
Lymphocytes Relative: 26 %
Lymphs Abs: 1.6 10*3/uL (ref 0.7–4.0)
MCH: 30.5 pg (ref 26.0–34.0)
MCHC: 34.2 g/dL (ref 30.0–36.0)
MCV: 89.2 fL (ref 80.0–100.0)
Monocytes Absolute: 0.6 10*3/uL (ref 0.1–1.0)
Monocytes Relative: 9 %
Neutro Abs: 3.8 10*3/uL (ref 1.7–7.7)
Neutrophils Relative %: 64 %
Platelets: 347 10*3/uL (ref 150–400)
RBC: 4.26 MIL/uL (ref 4.22–5.81)
RDW: 12.9 % (ref 11.5–15.5)
WBC: 6 10*3/uL (ref 4.0–10.5)
nRBC: 0 % (ref 0.0–0.2)

## 2020-07-03 MED ORDER — SODIUM CHLORIDE 0.9 % IV SOLN
200.0000 mg | Freq: Once | INTRAVENOUS | Status: AC
  Administered 2020-07-03: 200 mg via INTRAVENOUS
  Filled 2020-07-03: qty 8

## 2020-07-03 MED ORDER — SODIUM CHLORIDE 0.9 % IV SOLN
Freq: Once | INTRAVENOUS | Status: AC
  Filled 2020-07-03: qty 250

## 2020-07-03 MED ORDER — HEPARIN SOD (PORK) LOCK FLUSH 100 UNIT/ML IV SOLN
INTRAVENOUS | Status: AC
  Filled 2020-07-03: qty 5

## 2020-07-03 MED ORDER — HEPARIN SOD (PORK) LOCK FLUSH 100 UNIT/ML IV SOLN
500.0000 [IU] | Freq: Once | INTRAVENOUS | Status: AC | PRN
  Administered 2020-07-03: 500 [IU]
  Filled 2020-07-03: qty 5

## 2020-07-03 MED ORDER — SODIUM CHLORIDE 0.9% FLUSH
10.0000 mL | INTRAVENOUS | Status: DC | PRN
  Administered 2020-07-03: 10 mL via INTRAVENOUS
  Filled 2020-07-03: qty 10

## 2020-07-03 NOTE — Progress Notes (Signed)
Hematology/Oncology Consult note [facility]  [phone] [phone]  Patient Care Team: Patient, No Pcp Per as PCP - General ([place]) Telford Nab, RN as Registered Nurse Sindy Guadeloupe, MD as Consulting Physician (Hematology and Oncology) Sindy Guadeloupe, MD as Consulting Physician (Hematology and Oncology)   Name of the patient: Kyle Guerrero  [id number]  May 05, 1971   Date of visit: 07/03/20  Diagnosis- Non-small cell lung cancer stage IV acT2 cN2 cM1 a with pleural involvement  Chief complaint/ Reason for visit-on treatment assessment prior to cycle 5 of maintenance Keytruda  Heme/Onc history: patient is a 49 year old male who presented to the ER with symptoms ofheaviness in his chest and upper left chest discomfort he underwent CT angio chest to rule out PE which showed 3.8 x 3.3 cm left upper palpable lung mass along with 4.4 x 3.3 cm lobulated mass in the aortopulmonary window and 2.6 x 2.4 cm left hilar mass all concerning for malignancy. Patient has also seen pulmonary and has been set up for bronchoscopy and EBUS guided biopsy on 11/23/2019. Patient underwent. PET CT scan which showed a hypermetabolic spiculated 3.5 cm of 5 left upper lobe lung mass, adjacent hypermetabolic 3.2 x 1.2 cm pleural metastases in the medial posterior by the left pleural space along with scalloping of the adjacent posterior left third rib. Hypermetabolic infiltrative left perihilar conglomerate nodal metastases measuring up to 7.3 x 3.6 cm and 0.8 cm high left mediastinal node between the left brachiocephalic vein and left subclavian artery. No evidence of distant metastatic disease  Biopsy showed non-small cell lung cancer but further characterization could not be determined. Insufficient tissue for NGS testing. Repeat biopsy done. Results of NGStestingshowed PD-L1 50%. Tumor mutational burden high. 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  Patient completed concurrent chemoradiation with carbotaxol chemotherapy followed by 2 cycles of carbotaxol Keytruda and now on maintenance Keytruda  Interval history-unable to lift his left arm above his head.  He is seeing orthopedics today.  Otherwise doing well and denies any complaints at this time  ECOG PS- 0 Pain scale- 4  Review of systems- Review of Systems  Constitutional: Negative for chills, fever, malaise/fatigue and weight loss.  HENT: Negative for congestion, ear discharge and nosebleeds.   Eyes: Negative for blurred vision.  Respiratory: Negative for cough, hemoptysis, sputum production, shortness of breath and wheezing.   Cardiovascular: Negative for chest pain, palpitations, orthopnea and claudication.  Gastrointestinal: Negative for abdominal pain, blood in stool, constipation, diarrhea, heartburn, melena, nausea and vomiting.  Genitourinary: Negative for dysuria, flank pain, frequency, hematuria and urgency.  Musculoskeletal: Negative for back pain, joint pain (Left shoulder pain) and myalgias.  Skin: Negative for rash.  Neurological: Negative for dizziness, tingling, focal weakness, seizures, weakness and headaches.  Endo/Heme/Allergies: Does not bruise/bleed easily.  Psychiatric/Behavioral: Negative for depression and suicidal ideas. The patient does not have insomnia.       No Known Allergies   Past Medical History:  Diagnosis Date  . Asthma   . Lung cancer [facility])      Past Surgical History:  Procedure Laterality Date  . CYST EXCISION    . IR IMAGING GUIDED PORT INSERTION  12/05/2019  . VIDEO BRONCHOSCOPY WITH ENDOBRONCHIAL ULTRASOUND Left 11/22/2019   Procedure: VIDEO BRONCHOSCOPY WITH ENDOBRONCHIAL ULTRASOUND;  Surgeon: Tyler Pita, MD;  Location: [facility] ORS;  Service: Thoracic;  Laterality: Left;    Social History   Socioeconomic History  .  Marital status: Single    Spouse name: Not on file  .  Number of children: Not on file  . Years of education: Not on file  . Highest education level: Not on file  Occupational History  . Not on file  Tobacco Use  . Smoking status: Former Smoker    Packs/day: 2.00    Types: Cigarettes    Quit date: 11/08/2019    Years since quitting: 0.6  . Smokeless tobacco: Never Used  Vaping Use  . Vaping Use: Never used  Substance and Sexual Activity  . Alcohol use: Not Currently  . Drug use: Yes    Types: Marijuana  . Sexual activity: Not on file  Other Topics Concern  . Not on file  Social History Narrative  . Not on file   Social Determinants of Health   Financial Resource Strain:   . Difficulty of Paying Living Expenses:   Food Insecurity:   . Worried About Charity fundraiser in the Last Year:   . Arboriculturist in the Last Year:   Transportation Needs:   . Film/video editor (Medical):   Marland Kitchen Lack of Transportation (Non-Medical):   Physical Activity:   . Days of Exercise per Week:   . Minutes of Exercise per Session:   Stress:   . Feeling of Stress :   Social Connections:   . Frequency of Communication with Friends and Family:   . Frequency of Social Gatherings with Friends and Family:   . Attends Religious Services:   . Active Member of Clubs or Organizations:   . Attends Archivist Meetings:   Marland Kitchen Marital Status:   Intimate Partner Violence:   . Fear of Current or Ex-Partner:   . Emotionally Abused:   Marland Kitchen Physically Abused:   . Sexually Abused:     Family History  Problem Relation Age of Onset  . Healthy Mother   . Healthy Father      Current Outpatient Medications:  .  traMADol (ULTRAM) 50 MG tablet, Take 50 mg by mouth every 6 (six) hours as needed., Disp: , Rfl:  .  azelastine (ASTELIN) 0.1 % nasal spray, Place 1 spray into the nose as needed.  (Patient not taking: Reported on 07/02/2020), Disp: , Rfl:  .  cyclobenzaprine (FLEXERIL) 5 MG tablet, Take 5 mg by mouth 3 (three) times daily as needed. (Patient  not taking: Reported on 07/02/2020), Disp: , Rfl:  .  sucralfate (CARAFATE) 1 g tablet, Take 1 tablet (1 g total) by mouth 3 (three) times daily before meals. Dissolve in warm water, swish and swallow (Patient not taking: Reported on 07/02/2020), Disp: 90 tablet, Rfl: 6 No current facility-administered medications for this visit.  Facility-Administered Medications Ordered in Other Visits:  .  sodium chloride flush (NS) 0.9 % injection 10 mL, 10 mL, Intravenous, PRN, Sindy Guadeloupe, MD, 10 mL at 05/01/20 0900 .  sodium chloride flush (NS) 0.9 % injection 10 mL, 10 mL, Intravenous, PRN, Sindy Guadeloupe, MD  Physical exam: There were no vitals filed for this visit. Physical Exam Cardiovascular:     Rate and Rhythm: Normal rate and regular rhythm.     Heart sounds: Normal heart sounds.  Pulmonary:     Effort: Pulmonary effort is normal.     Breath sounds: Normal breath sounds.  Abdominal:     General: Bowel sounds are normal.     Palpations: Abdomen is soft.  Skin:    General: Skin is  warm and dry.  Neurological:     Mental Status: He is alert and oriented to person, place, and time.      CMP Latest Ref Rng & Units 06/12/2020  Glucose 70 - 99 mg/dL 138(H)  BUN 6 - 20 mg/dL 16  Creatinine 0.61 - 1.24 mg/dL 0.96  Sodium 135 - 145 mmol/L 137  Potassium 3.5 - 5.1 mmol/L 3.7  Chloride 98 - 111 mmol/L 101  CO2 22 - 32 mmol/L 25  Calcium 8.9 - 10.3 mg/dL 9.0  Total Protein 6.5 - 8.1 g/dL 8.1  Total Bilirubin 0.3 - 1.2 mg/dL 0.6  Alkaline Phos 38 - 126 U/L 105  AST 15 - 41 U/L 23  ALT 0 - 44 U/L 27   CBC Latest Ref Rng & Units 06/12/2020  WBC 4.0 - 10.5 K/uL 5.1  Hemoglobin 13.0 - 17.0 g/dL 13.6  Hematocrit 39 - 52 % 40.8  Platelets 150 - 400 K/uL 356    No images are attached to the encounter.  CT Chest W Contrast  Result Date: 06/11/2020 CLINICAL DATA:  Restaging non-small cell lung cancer. EXAM: CT CHEST WITH CONTRAST TECHNIQUE: Multidetector CT imaging of the chest was performed  during intravenous contrast administration. CONTRAST:  43m OMNIPAQUE IOHEXOL 300 MG/ML  SOLN COMPARISON:  03/24/2020 FINDINGS: Cardiovascular: The heart size appears normal. No pericardial effusion identified. Mediastinum/Nodes: Normal appearance of the thyroid gland. The trachea appears patent and is midline. Normal appearance of the esophagus. The previous pre-vascular and AP window soft tissue measures 2.1 x 1.3 cm, image 54/2. Previously 3.0 by 2.2 cm. No contralateral mediastinal or hilar adenopathy. Left supraclavicular lymph node has a short axis of 1.1 cm, image 20/2. Previously this measured 0.5 cm. Lungs/Pleura: Left upper lobe lesion measures 1.4 x 1.0 by 2.8 cm, image 32/2 and image 76/4. Previously (by my measurements this measured 1.7 x 1.3 by 3.0 cm. The patchy sub solid density in the superior segment of left lower lobe is less conspicuous and smaller on today's study, likely postinflammatory, image 58/3. New nodule within the left upper lobe lateral to the dominant index lesion measures 4 mm and is new from previous exam, nonspecific unchanged 4 mm nodule within the superior segment of right lower lobe. Upper Abdomen: No acute abnormality noted within the imaged portions of the upper abdomen. Musculoskeletal: No chest wall abnormality. No acute or significant osseous findings. IMPRESSION: 1. Overall, there has been interval response to therapy. The previous index lesion within the left upper lobe has decreased in size in the interval. The pre-vascular and AP window soft tissue has also decreased in size in the interval. 2. There is a new 4 mm nodule within the left upper lobe lateral to the dominant index lesion. Nonspecific attention on follow-up imaging is advised. 3. Left supraclavicular lymph node has a short axis of 1.1 cm and has increased in size in the interval. Previously this measured 0.5 cm. Although nonspecific this warrants close attention on follow-up imaging. If immediate further  evaluation is clinically indicated this could be sampled under ultrasound guidance. 4. Decrease in size of previous sub solid nodule in the superior segment of left lower lobe which is likely post inflammatory in etiology. Electronically Signed   By: TKerby MoorsM.D.   On: 06/11/2020 14:19   MR SHOULDER LEFT W WO CONTRAST  Result Date: 06/27/2020 CLINICAL DATA:  Left-sided neck pain radiating to the shoulder. History of lung cancer. EXAM: MRI OF THE LEFT SHOULDER WITHOUT AND WITH CONTRAST  TECHNIQUE: Multiplanar, multisequence MR imaging of the left shoulder shoulder was performed before and after the administration of intravenous contrast. CONTRAST:  7.28m GADAVIST GADOBUTROL 1 MMOL/ML IV SOLN COMPARISON:  CT chest dated June 11, 2020. FINDINGS: Rotator cuff: Small intrasubstance tear of the supraspinatus tendon at the insertion. The infraspinatus, teres minor, and subscapularis tendons are unremarkable. Muscles: No atrophy or abnormal signal of the muscles of the rotator cuff. Biceps long head:  Intact and normally positioned. Acromioclavicular Joint: Mild arthropathy of the acromioclavicular joint. Type II acromion. No subacromial/subdeltoid bursal fluid. Glenohumeral Joint: No joint effusion. No chondral defect. Labrum: Grossly intact, but evaluation is limited by lack of intraarticular fluid. Bones:  No marrow abnormality, fracture or dislocation. Other: None. IMPRESSION: 1. Small intrasubstance tear of the supraspinatus tendon at the insertion. 2. Mild acromioclavicular osteoarthritis. Electronically Signed   By: WTitus DubinM.D.   On: 06/27/2020 11:48     Assessment and plan- Patient is a 49y.o. male with stage IV adenocarcinoma of the lung with pleural metastases.  He is here for on treatment assessment prior to cycle 5 of maintenance Keytruda  Counts okay to proceed with cycle 5 of maintenance Keytruda today.  He will be seen in 3 weeks by covering NP for cycle 6 of Keytruda and I will see  him back in 6 weeks for cycle 7.  Plan to check TSH with cycle 7.  Left shoulder pain:MRI of the shoulder does not show any evidence of malignancy.  Small intrasubstance tear of the supraspinatus tendon at the insertion.  Mild acromioclavicular osteoarthritis.  He will continue to follow-up with orthopedics for this   Visit Diagnosis 1. Encounter for antineoplastic immunotherapy   2. Malignant neoplasm of upper lobe of left lung ([place]   3. Acute pain of left shoulder      Dr. ARanda Evens MD, MPH [id number]/08/2020 9:26 AM

## 2020-07-10 DIAGNOSIS — R29898 Other symptoms and signs involving the musculoskeletal system: Secondary | ICD-10-CM | POA: Insufficient documentation

## 2020-07-24 ENCOUNTER — Encounter: Payer: Self-pay | Admitting: Oncology

## 2020-07-24 ENCOUNTER — Other Ambulatory Visit: Payer: Self-pay

## 2020-07-24 ENCOUNTER — Inpatient Hospital Stay (HOSPITAL_BASED_OUTPATIENT_CLINIC_OR_DEPARTMENT_OTHER): Payer: 59 | Admitting: Oncology

## 2020-07-24 ENCOUNTER — Inpatient Hospital Stay: Payer: 59

## 2020-07-24 VITALS — BP 125/95 | HR 99 | Temp 97.8°F | Resp 16 | Ht 71.0 in | Wt 176.8 lb

## 2020-07-24 DIAGNOSIS — C3412 Malignant neoplasm of upper lobe, left bronchus or lung: Secondary | ICD-10-CM | POA: Diagnosis not present

## 2020-07-24 DIAGNOSIS — M542 Cervicalgia: Secondary | ICD-10-CM | POA: Diagnosis not present

## 2020-07-24 DIAGNOSIS — Z95828 Presence of other vascular implants and grafts: Secondary | ICD-10-CM

## 2020-07-24 LAB — CBC WITH DIFFERENTIAL/PLATELET
Abs Immature Granulocytes: 0.04 10*3/uL (ref 0.00–0.07)
Basophils Absolute: 0 10*3/uL (ref 0.0–0.1)
Basophils Relative: 1 %
Eosinophils Absolute: 0.3 10*3/uL (ref 0.0–0.5)
Eosinophils Relative: 5 %
HCT: 36.7 % — ABNORMAL LOW (ref 39.0–52.0)
Hemoglobin: 12.3 g/dL — ABNORMAL LOW (ref 13.0–17.0)
Immature Granulocytes: 1 %
Lymphocytes Relative: 25 %
Lymphs Abs: 1.5 10*3/uL (ref 0.7–4.0)
MCH: 29.8 pg (ref 26.0–34.0)
MCHC: 33.5 g/dL (ref 30.0–36.0)
MCV: 88.9 fL (ref 80.0–100.0)
Monocytes Absolute: 0.7 10*3/uL (ref 0.1–1.0)
Monocytes Relative: 10 %
Neutro Abs: 3.7 10*3/uL (ref 1.7–7.7)
Neutrophils Relative %: 58 %
Platelets: 405 10*3/uL — ABNORMAL HIGH (ref 150–400)
RBC: 4.13 MIL/uL — ABNORMAL LOW (ref 4.22–5.81)
RDW: 13.2 % (ref 11.5–15.5)
WBC: 6.3 10*3/uL (ref 4.0–10.5)
nRBC: 0 % (ref 0.0–0.2)

## 2020-07-24 LAB — COMPREHENSIVE METABOLIC PANEL
ALT: 20 U/L (ref 0–44)
AST: 19 U/L (ref 15–41)
Albumin: 3.5 g/dL (ref 3.5–5.0)
Alkaline Phosphatase: 113 U/L (ref 38–126)
Anion gap: 8 (ref 5–15)
BUN: 19 mg/dL (ref 6–20)
CO2: 26 mmol/L (ref 22–32)
Calcium: 8.7 mg/dL — ABNORMAL LOW (ref 8.9–10.3)
Chloride: 100 mmol/L (ref 98–111)
Creatinine, Ser: 0.85 mg/dL (ref 0.61–1.24)
GFR calc Af Amer: >60 mL/min (ref >60)
GFR calc non Af Amer: >60 mL/min (ref >60)
Glucose, Bld: 99 mg/dL (ref 70–99)
Potassium: 3.9 mmol/L (ref 3.5–5.1)
Sodium: 134 mmol/L — ABNORMAL LOW (ref 135–145)
Total Bilirubin: 0.4 mg/dL (ref 0.3–1.2)
Total Protein: 7.6 g/dL (ref 6.5–8.1)

## 2020-07-24 MED ORDER — SODIUM CHLORIDE 0.9% FLUSH
10.0000 mL | INTRAVENOUS | Status: DC | PRN
  Administered 2020-07-24: 10 mL via INTRAVENOUS
  Filled 2020-07-24: qty 10

## 2020-07-24 MED ORDER — SODIUM CHLORIDE 0.9 % IV SOLN
Freq: Once | INTRAVENOUS | Status: AC
  Filled 2020-07-24: qty 250

## 2020-07-24 MED ORDER — HEPARIN SOD (PORK) LOCK FLUSH 100 UNIT/ML IV SOLN
INTRAVENOUS | Status: AC
  Filled 2020-07-24: qty 5

## 2020-07-24 MED ORDER — SODIUM CHLORIDE 0.9 % IV SOLN
200.0000 mg | Freq: Once | INTRAVENOUS | Status: AC
  Administered 2020-07-24: 200 mg via INTRAVENOUS
  Filled 2020-07-24: qty 8

## 2020-07-24 MED ORDER — HEPARIN SOD (PORK) LOCK FLUSH 100 UNIT/ML IV SOLN
500.0000 [IU] | Freq: Once | INTRAVENOUS | Status: AC | PRN
  Administered 2020-07-24: 500 [IU]
  Filled 2020-07-24: qty 5

## 2020-07-24 NOTE — Progress Notes (Signed)
Hematology/Oncology Consult note [facility]  [phone] [phone]  Patient Care Team: Patient, No Pcp Per as PCP - General ([place]) Telford Nab, RN as Registered Nurse Sindy Guadeloupe, MD as Consulting Physician (Hematology and Oncology) Sindy Guadeloupe, MD as Consulting Physician (Hematology and Oncology)   Name of the patient: Kyle Guerrero  [id number]  1971/06/10   Date of visit: 07/24/20  Diagnosis- Non-small cell lung cancer stage IV acT2 cN2 cM1 a with pleural involvement  Chief complaint/ Reason for visit-on treatment assessment prior to cycle 8 of maintenance Keytruda  Heme/Onc history: patient is a 49 year old male who presented to the ER with symptoms ofheaviness in his chest and upper left chest discomfort he underwent CT angio chest to rule out PE which showed 3.8 x 3.3 cm left upper palpable lung mass along with 4.4 x 3.3 cm lobulated mass in the aortopulmonary window and 2.6 x 2.4 cm left hilar mass all concerning for malignancy. Patient has also seen pulmonary and has been set up for bronchoscopy and EBUS guided biopsy on 11/23/2019. Patient underwent. PET CT scan which showed a hypermetabolic spiculated 3.5 cm of 5 left upper lobe lung mass, adjacent hypermetabolic 3.2 x 1.2 cm pleural metastases in the medial posterior by the left pleural space along with scalloping of the adjacent posterior left third rib. Hypermetabolic infiltrative left perihilar conglomerate nodal metastases measuring up to 7.3 x 3.6 cm and 0.8 cm high left mediastinal node between the left brachiocephalic vein and left subclavian artery. No evidence of distant metastatic disease  Biopsy showed non-small cell lung cancer but further characterization could not be determined. Insufficient tissue for NGS testing. Repeat biopsy done. Results of NGStestingshowed PD-L1 50%. Tumor mutational burden high. 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  Patient completed concurrent chemoradiation with carbotaxol chemotherapy followed by 2 cycles of carbotaxol Keytruda and now on maintenance Keytruda  Interval history-continues to have concerns with left arm.  Followed by orthopedics and has a neck MRI scheduled for next week.  Otherwise doing well and denies any complaints at this time  ECOG PS- 0 Pain scale- 4  Review of systems- Review of Systems  Constitutional: Negative for chills, fever, malaise/fatigue and weight loss.  HENT: Negative for congestion, ear discharge and nosebleeds.   Eyes: Negative for blurred vision.  Respiratory: Negative for cough, hemoptysis, sputum production, shortness of breath and wheezing.   Cardiovascular: Negative for chest pain, palpitations, orthopnea and claudication.  Gastrointestinal: Negative for abdominal pain, blood in stool, constipation, diarrhea, heartburn, melena, nausea and vomiting.  Genitourinary: Negative for dysuria, flank pain, frequency, hematuria and urgency.  Musculoskeletal: Negative for back pain, joint pain (Left shoulder pain) and myalgias.  Skin: Negative for rash.  Neurological: Negative for dizziness, tingling, focal weakness, seizures, weakness and headaches.  Endo/Heme/Allergies: Does not bruise/bleed easily.  Psychiatric/Behavioral: Negative for depression and suicidal ideas. The patient does not have insomnia.       No Known Allergies   Past Medical History:  Diagnosis Date  . Asthma   . Lung cancer [facility])      Past Surgical History:  Procedure Laterality Date  . CYST EXCISION    . IR IMAGING GUIDED PORT INSERTION  12/05/2019  . VIDEO BRONCHOSCOPY WITH ENDOBRONCHIAL ULTRASOUND Left 11/22/2019   Procedure: VIDEO BRONCHOSCOPY WITH ENDOBRONCHIAL ULTRASOUND;  Surgeon: Tyler Pita, MD;  Location: [facility] ORS;  Service: Thoracic;  Laterality: Left;    Social History  Socioeconomic History  . Marital status: Single     Spouse name: Not on file  . Number of children: Not on file  . Years of education: Not on file  . Highest education level: Not on file  Occupational History  . Not on file  Tobacco Use  . Smoking status: Former Smoker    Packs/day: 2.00    Types: Cigarettes    Quit date: 11/08/2019    Years since quitting: 0.7  . Smokeless tobacco: Never Used  Vaping Use  . Vaping Use: Never used  Substance and Sexual Activity  . Alcohol use: Not Currently  . Drug use: Yes    Types: Marijuana  . Sexual activity: Not on file  Other Topics Concern  . Not on file  Social History Narrative  . Not on file   Social Determinants of Health   Financial Resource Strain:   . Difficulty of Paying Living Expenses:   Food Insecurity:   . Worried About Charity fundraiser in the Last Year:   . Arboriculturist in the Last Year:   Transportation Needs:   . Film/video editor (Medical):   Marland Kitchen Lack of Transportation (Non-Medical):   Physical Activity:   . Days of Exercise per Week:   . Minutes of Exercise per Session:   Stress:   . Feeling of Stress :   Social Connections:   . Frequency of Communication with Friends and Family:   . Frequency of Social Gatherings with Friends and Family:   . Attends Religious Services:   . Active Member of Clubs or Organizations:   . Attends Archivist Meetings:   Marland Kitchen Marital Status:   Intimate Partner Violence:   . Fear of Current or Ex-Partner:   . Emotionally Abused:   Marland Kitchen Physically Abused:   . Sexually Abused:     Family History  Problem Relation Age of Onset  . Healthy Mother   . Healthy Father      Current Outpatient Medications:  .  azelastine (ASTELIN) 0.1 % nasal spray, Place 1 spray into the nose as needed. , Disp: , Rfl:  .  cyclobenzaprine (FLEXERIL) 5 MG tablet, Take 5 mg by mouth 3 (three) times daily as needed. , Disp: , Rfl:  .  traMADol (ULTRAM) 50 MG tablet, Take 50 mg by mouth every 6 (six) hours as needed., Disp: , Rfl:  .   sucralfate (CARAFATE) 1 g tablet, Take 1 tablet (1 g total) by mouth 3 (three) times daily before meals. Dissolve in warm water, swish and swallow (Patient not taking: Reported on 07/02/2020), Disp: 90 tablet, Rfl: 6 No current facility-administered medications for this visit.  Facility-Administered Medications Ordered in Other Visits:  .  sodium chloride flush (NS) 0.9 % injection 10 mL, 10 mL, Intravenous, PRN, Sindy Guadeloupe, MD, 10 mL at 05/01/20 0900  Physical exam:  Vitals:   07/24/20 1017  BP: (!) 125/95  Pulse: 99  Resp: 16  Temp: 97.8 F (36.6 C)  TempSrc: Oral  Weight: 176 lb 12.8 oz (80.2 kg)  Height: _0  (1.803 m)   Physical Exam Cardiovascular:     Rate and Rhythm: Normal rate and regular rhythm.     Heart sounds: Normal heart sounds.  Pulmonary:     Effort: Pulmonary effort is normal.     Breath sounds: Normal breath sounds.  Abdominal:     General: Bowel sounds are normal.     Palpations: Abdomen is soft.  Skin:    General: Skin is warm and dry.  Neurological:     Mental Status: He is alert and oriented to person, place, and time.      CMP Latest Ref Rng & Units 07/24/2020  Glucose 70 - 99 mg/dL 99  BUN 6 - 20 mg/dL 19  Creatinine 0.61 - 1.24 mg/dL 0.85  Sodium 135 - 145 mmol/L 134(L)  Potassium 3.5 - 5.1 mmol/L 3.9  Chloride 98 - 111 mmol/L 100  CO2 22 - 32 mmol/L 26  Calcium 8.9 - 10.3 mg/dL 8.7(L)  Total Protein 6.5 - 8.1 g/dL 7.6  Total Bilirubin 0.3 - 1.2 mg/dL 0.4  Alkaline Phos 38 - 126 U/L 113  AST 15 - 41 U/L 19  ALT 0 - 44 U/L 20   CBC Latest Ref Rng & Units 07/24/2020  WBC 4.0 - 10.5 K/uL 6.3  Hemoglobin 13.0 - 17.0 g/dL 12.3(L)  Hematocrit 39 - 52 % 36.7(L)  Platelets 150 - 400 K/uL 405(H)    No images are attached to the encounter.  MR SHOULDER LEFT W WO CONTRAST  Result Date: 06/27/2020 CLINICAL DATA:  Left-sided neck pain radiating to the shoulder. History of lung cancer. EXAM: MRI OF THE LEFT SHOULDER WITHOUT AND WITH CONTRAST  TECHNIQUE: Multiplanar, multisequence MR imaging of the left shoulder shoulder was performed before and after the administration of intravenous contrast. CONTRAST:  7.61m GADAVIST GADOBUTROL 1 MMOL/ML IV SOLN COMPARISON:  CT chest dated June 11, 2020. FINDINGS: Rotator cuff: Small intrasubstance tear of the supraspinatus tendon at the insertion. The infraspinatus, teres minor, and subscapularis tendons are unremarkable. Muscles: No atrophy or abnormal signal of the muscles of the rotator cuff. Biceps long head:  Intact and normally positioned. Acromioclavicular Joint: Mild arthropathy of the acromioclavicular joint. Type II acromion. No subacromial/subdeltoid bursal fluid. Glenohumeral Joint: No joint effusion. No chondral defect. Labrum: Grossly intact, but evaluation is limited by lack of intraarticular fluid. Bones:  No marrow abnormality, fracture or dislocation. Other: None. IMPRESSION: 1. Small intrasubstance tear of the supraspinatus tendon at the insertion. 2. Mild acromioclavicular osteoarthritis. Electronically Signed   By: WTitus DubinM.D.   On: 06/27/2020 11:48     Assessment and plan- Patient is a 49y.o. male with stage IV adenocarcinoma of the lung with pleural metastases.  He is here for on treatment assessment prior to cycle 8 of maintenance Keytruda.  Counts okay to proceed with cycle 8 of maintenance Keytruda today.  He will return to clinic in 3 weeks to see Dr. RJanese Banksprior to cycle 9.   Left shoulder pain:MRI of the shoulder does not show any evidence of malignancy.  Small intrasubstance tear of the supraspinatus tendon at the insertion.  Mild acromioclavicular osteoarthritis.  He will continue to follow-up with orthopedics for this.   Visit Diagnosis 1. Malignant neoplasm of upper lobe of left lung ([facility]ope   2. Neck pain     Greater than 50% was spent in counseling and coordination of care with this patient including but not limited to discussion of the relevant topics above (See  A&P) including, but not limited to diagnosis and management of acute and chronic medical conditions.   JFaythe Casa NP 07/24/2020 11:51 AM

## 2020-07-24 NOTE — Progress Notes (Signed)
Pt pain continues to hurt left shoulder down his arm. He feels he does not eat as good due topain. He does not take pain med for work and takes it when he gets home. He does use muscle relaxers also. He went to see [facility] and had mri and now needs mri on a different area and it is 8/2 and he sees [facility] on 8/5. He is hopeful for them to figure out  The issue and find a way to fix it. He is drinking good and bowels are good

## 2020-07-31 ENCOUNTER — Encounter: Payer: Self-pay | Admitting: *Deleted

## 2020-07-31 ENCOUNTER — Telehealth: Payer: Self-pay | Admitting: *Deleted

## 2020-07-31 DIAGNOSIS — R221 Localized swelling, mass and lump, neck: Secondary | ICD-10-CM

## 2020-07-31 DIAGNOSIS — C3490 Malignant neoplasm of unspecified part of unspecified bronchus or lung: Secondary | ICD-10-CM

## 2020-07-31 NOTE — Telephone Encounter (Signed)
Pt is aware of recommendations and in agreement. Asked if could get a note for work since he is unable to perform his job functions safely. Letter signed by Sonia Baller, NP and left at the front desk for pt to pick up.

## 2020-07-31 NOTE — Telephone Encounter (Signed)
Pt left message that he had new imaging performed at [facility] and they found a new neck mass that he is concerned about. Attempted to call pt back to inform that covering MD review neck MRI report and advised for pt to have PET scan for further workup.   Will call pt back once PET scan has been scheduled to review upcoming appts and next steps.

## 2020-08-05 ENCOUNTER — Ambulatory Visit
Admission: RE | Admit: 2020-08-05 | Discharge: 2020-08-05 | Disposition: A | Discharge status: Home / Self Care | Payer: Self-pay | Source: Ambulatory Visit | Attending: Oncology | Admitting: Oncology

## 2020-08-05 ENCOUNTER — Inpatient Hospital Stay
Admission: RE | Admit: 2020-08-05 | Discharge: 2020-08-05 | Disposition: A | Discharge status: Home / Self Care | Payer: Self-pay | Source: Ambulatory Visit | Attending: Oncology | Admitting: Oncology

## 2020-08-05 ENCOUNTER — Other Ambulatory Visit: Payer: Self-pay | Admitting: Oncology

## 2020-08-05 ENCOUNTER — Telehealth: Payer: Self-pay | Admitting: *Deleted

## 2020-08-05 DIAGNOSIS — C3490 Malignant neoplasm of unspecified part of unspecified bronchus or lung: Secondary | ICD-10-CM

## 2020-08-05 NOTE — Telephone Encounter (Signed)
Pt made aware of appt for PET scan scheduled on Wed 8/18 at 12:30pm. Instructed to arrive at 12pm at the [facility] and will need to remain NPO 6 hours before. Pt verbalized understanding.

## 2020-08-06 ENCOUNTER — Encounter: Payer: Self-pay | Admitting: *Deleted

## 2020-08-06 ENCOUNTER — Other Ambulatory Visit: Payer: Self-pay | Admitting: Oncology

## 2020-08-06 DIAGNOSIS — C3490 Malignant neoplasm of unspecified part of unspecified bronchus or lung: Secondary | ICD-10-CM

## 2020-08-06 DIAGNOSIS — R221 Localized swelling, mass and lump, neck: Secondary | ICD-10-CM

## 2020-08-07 NOTE — Progress Notes (Signed)
Patient on schedule for Neck mass biopsy 08/11/2020,called and given pre procedure instructions to patient. Made aware to be here @ 0930, NPO after Mn prior to procedure and have driver in case sedation is required. Stated understanding'

## 2020-08-07 NOTE — Progress Notes (Signed)
  Oncology Nurse Navigator Documentation  Navigator Location: [facility] (08/07/20 1300)   )Navigator Encounter Type: Telephone (08/07/20 1300) Telephone: [place] Call (08/07/20 1300)                       Barriers/Navigation Needs: Coordination of Care (08/07/20 1300)   Interventions: Coordination of Care;Referrals (08/07/20 1300) Referrals: Radiation Oncology (08/07/20 1300) Coordination of Care: Appts;Radiology (08/07/20 1300)       phone call made to patient to review upcoming appts for US biopsy on 8/17 and PET scan on 8/18. Pt made aware to keep appt scheduled on 8/20 to follow up with Dr. Janese Banks. Also, made aware that Dr. Baruch Gouty would like to follow up with him as well on 8/24 to discuss radiation treatment to the neck mass. All questions answered during call. Instructed to call back with any further questions or needs. Pt verbalized understanding.            Time Spent with Patient: 30 (08/07/20 1300)

## 2020-08-10 ENCOUNTER — Other Ambulatory Visit: Payer: Self-pay | Admitting: Radiology

## 2020-08-11 ENCOUNTER — Ambulatory Visit
Admission: RE | Admit: 2020-08-11 | Discharge: 2020-08-11 | Disposition: A | Discharge status: Home / Self Care | Payer: 59 | Source: Ambulatory Visit | Attending: Oncology | Admitting: Oncology

## 2020-08-11 ENCOUNTER — Other Ambulatory Visit: Payer: Self-pay

## 2020-08-11 DIAGNOSIS — R221 Localized swelling, mass and lump, neck: Secondary | ICD-10-CM | POA: Diagnosis present

## 2020-08-11 DIAGNOSIS — C7951 Secondary malignant neoplasm of bone: Secondary | ICD-10-CM | POA: Insufficient documentation

## 2020-08-11 DIAGNOSIS — Z9221 Personal history of antineoplastic chemotherapy: Secondary | ICD-10-CM | POA: Insufficient documentation

## 2020-08-11 DIAGNOSIS — C7989 Secondary malignant neoplasm of other specified sites: Secondary | ICD-10-CM | POA: Diagnosis not present

## 2020-08-11 DIAGNOSIS — J45909 Unspecified asthma, uncomplicated: Secondary | ICD-10-CM | POA: Insufficient documentation

## 2020-08-11 DIAGNOSIS — Z85118 Personal history of other malignant neoplasm of bronchus and lung: Secondary | ICD-10-CM | POA: Diagnosis not present

## 2020-08-11 DIAGNOSIS — Z87891 Personal history of nicotine dependence: Secondary | ICD-10-CM | POA: Insufficient documentation

## 2020-08-11 DIAGNOSIS — G5690 Unspecified mononeuropathy of unspecified upper limb: Secondary | ICD-10-CM | POA: Diagnosis not present

## 2020-08-11 DIAGNOSIS — C3490 Malignant neoplasm of unspecified part of unspecified bronchus or lung: Secondary | ICD-10-CM

## 2020-08-11 IMAGING — US IR BIOPSY CORE MUSCLE/SOFT TISSUE
1 series · 12 of 12 positions shown · non-contrast
Comparison: none

CLINICAL DATA: History of treated lung carcinoma with large left
neck mass most likely representing recurrent metastatic disease.

[Series 1: ir biopsy core muscle/soft tissue · 12 of 12 slices shown]
[im 1/12]
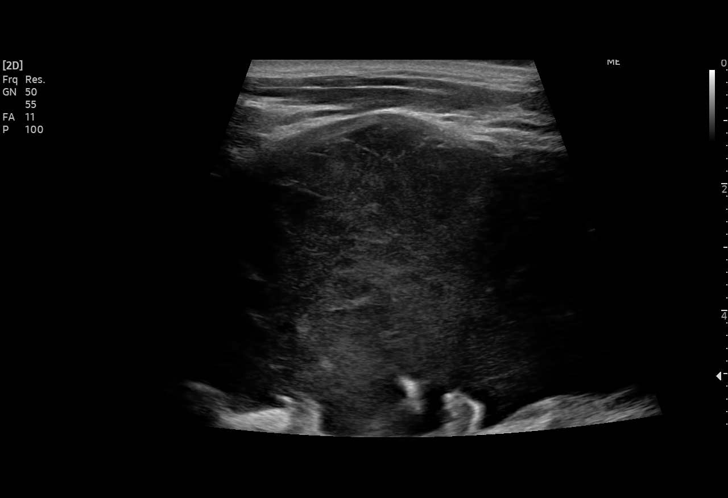
[im 2/12]
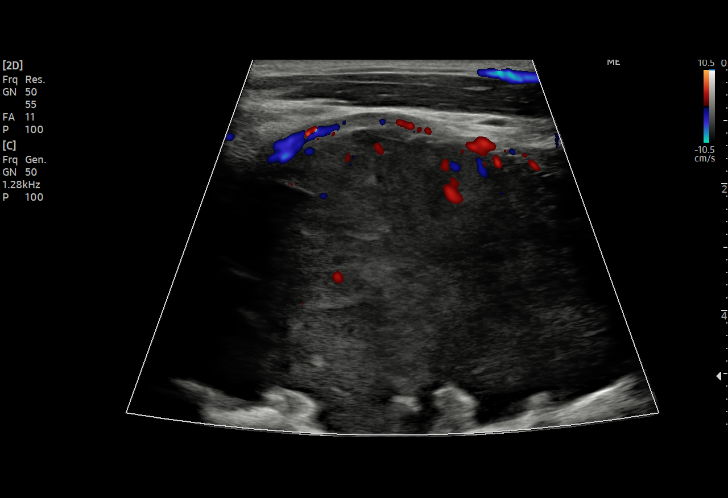
[im 3/12]
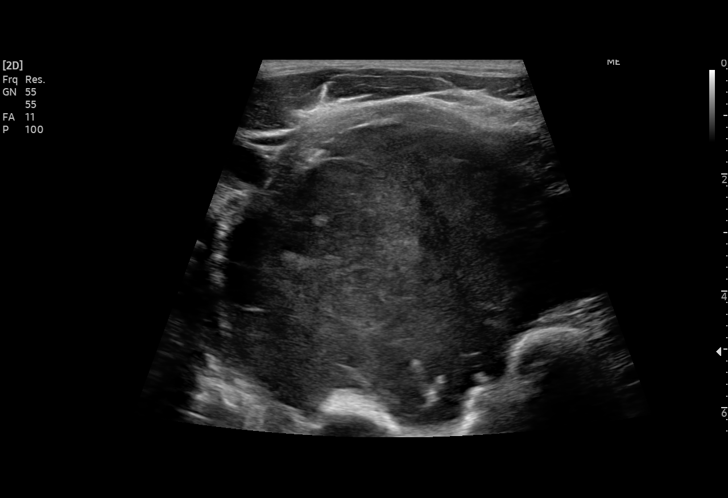
[im 4/12]
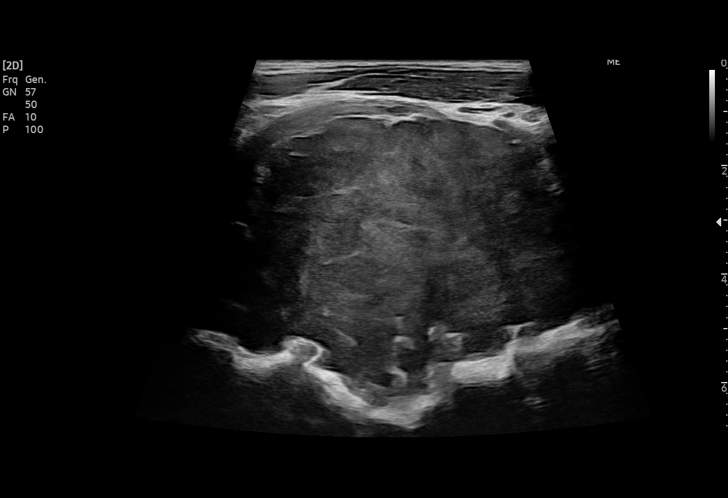
[im 5/12]
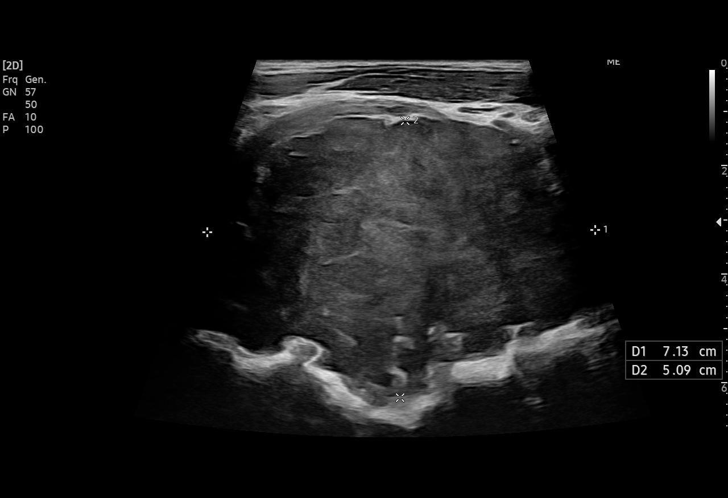
[im 6/12]
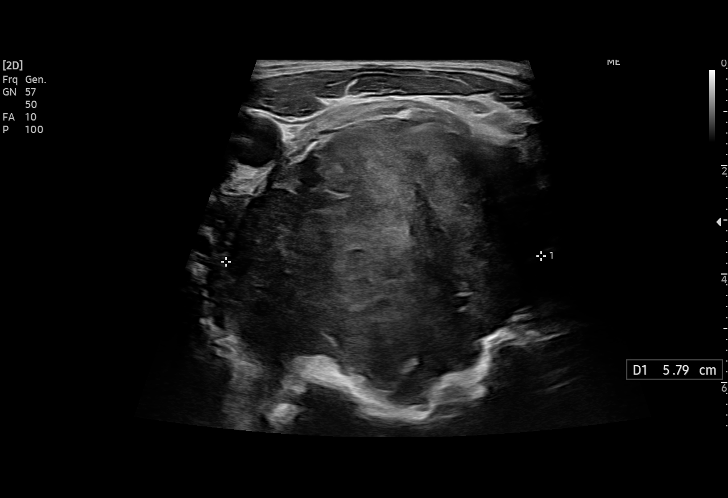
[im 7/12]
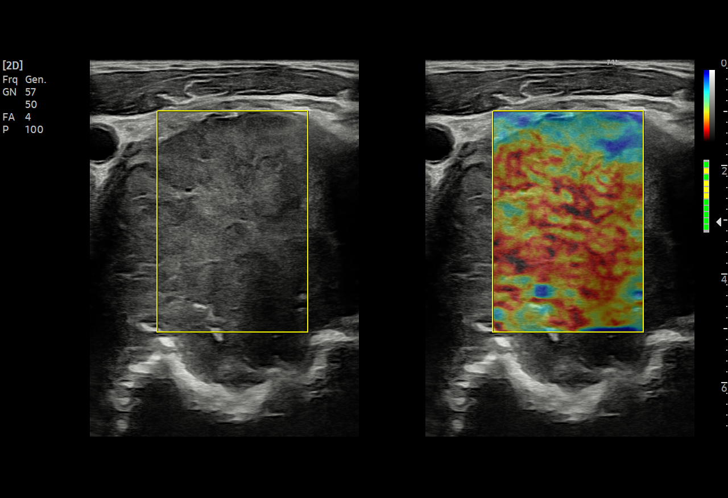
[im 8/12]
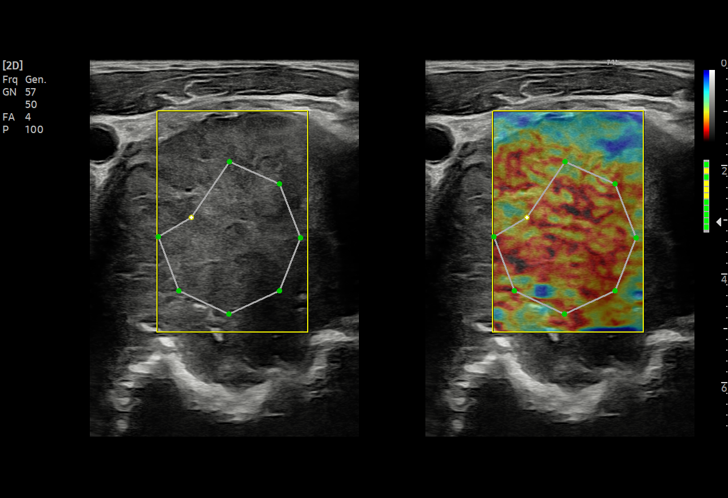
[im 9/12]
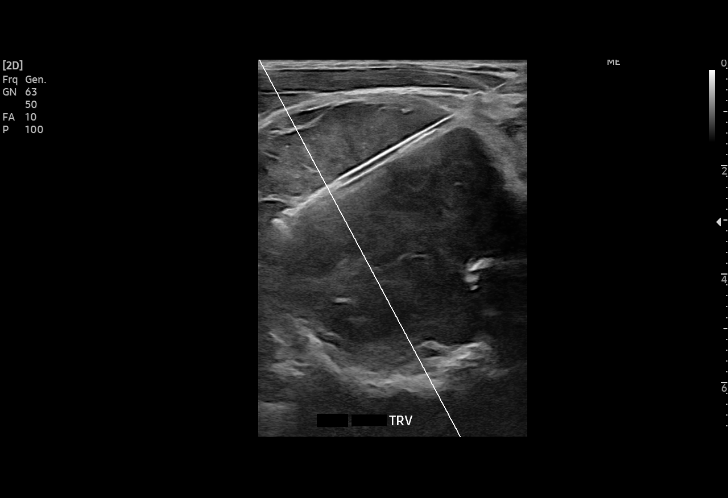
[im 10/12]
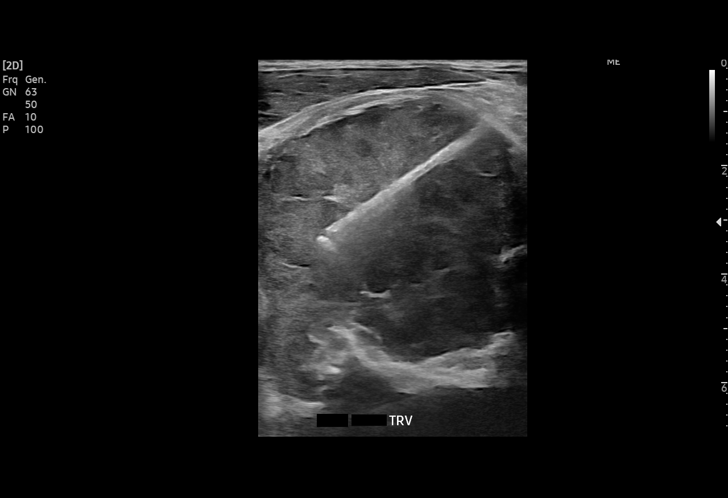
[im 11/12]
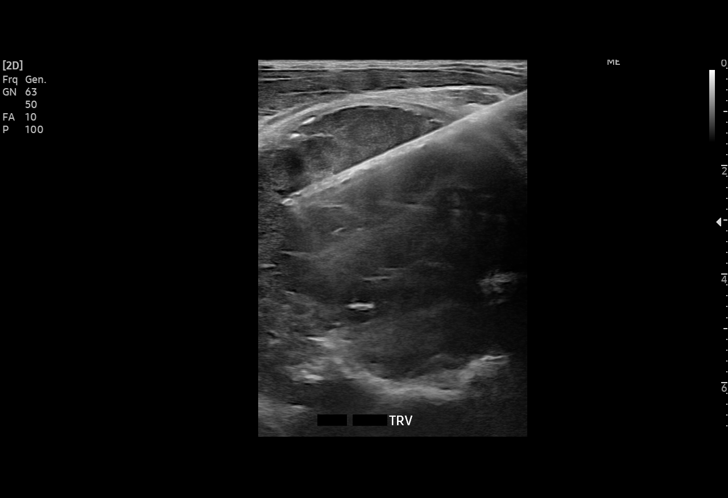
[im 12/12]
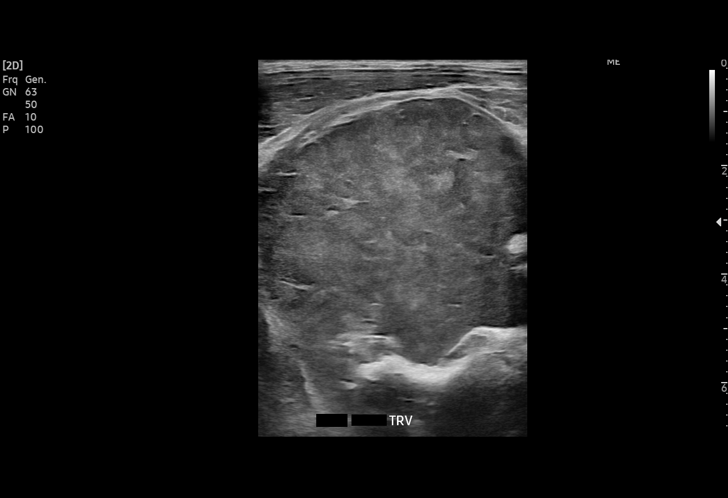

[12 of 12 positions shown; findings below may reference images not displayed]

EXAM:
ULTRASOUND GUIDED CORE BIOPSY OF LEFT NECK MASS

MEDICATIONS:
2.0 mg IV Versed; 100 mcg IV Fentanyl

Total Moderate Sedation Time: 11 minutes.

The patient's level of consciousness and physiologic status were
continuously monitored during the procedure by Radiology nursing.

PROCEDURE:
The procedure, risks, benefits, and alternatives were explained to
the patient. Questions regarding the procedure were encouraged and
answered. The patient understands and consents to the procedure. A
time out was performed prior to initiating the procedure.

Ultrasound was performed of the left neck. The left neck was prepped
with chlorhexidine in a sterile fashion, and a sterile drape was
applied covering the operative field. A sterile gown and sterile
gloves were used for the procedure. Local anesthesia was provided
with 1% Lidocaine.

Under ultrasound guidance, 18 gauge core biopsy samples were
obtained of a left neck mass. Three separate core biopsy samples
were obtained and submitted in formalin. Additional ultrasound was
performed.

COMPLICATIONS:
None.
FINDINGS: Large solid mass in the left neck occupies much of the lateral and
posterolateral neck and measures greater than 7 cm in maximal
diameter by ultrasound. Solid tissue was obtained.
IMPRESSION: Ultrasound-guided core biopsy performed of a large solid mass in the
left neck.

## 2020-08-11 MED ORDER — FENTANYL CITRATE (PF) 100 MCG/2ML IJ SOLN
INTRAMUSCULAR | Status: AC | PRN
  Administered 2020-08-11 (×2): 50 ug via INTRAVENOUS

## 2020-08-11 MED ORDER — FENTANYL CITRATE (PF) 100 MCG/2ML IJ SOLN
INTRAMUSCULAR | Status: AC
  Filled 2020-08-11: qty 2

## 2020-08-11 MED ORDER — SODIUM CHLORIDE 0.9 % IV SOLN: INTRAVENOUS | Status: DC

## 2020-08-11 MED ORDER — MIDAZOLAM HCL 5 MG/5ML IJ SOLN
INTRAMUSCULAR | Status: AC
  Filled 2020-08-11: qty 5

## 2020-08-11 MED ORDER — MIDAZOLAM HCL 2 MG/2ML IJ SOLN
INTRAMUSCULAR | Status: AC | PRN
  Administered 2020-08-11 (×2): 1 mg via INTRAVENOUS

## 2020-08-11 NOTE — Progress Notes (Signed)
Patient clinically stable post Left Neck Mass biopsy. Tolerated well. Awake/alert and oriented post procedure. bandade dressing dry/intact. Denies complaints at this time. Received Versed 2 mg along with Fentanyl 100 mcg Iv for procedure. Report given to [facility] Rn post procedure for recovery.

## 2020-08-11 NOTE — Procedures (Signed)
Interventional Radiology Procedure Note  Procedure: US Guided Biopsy of left neck mass  Complications: None  Estimated Blood Loss: < 10 mL  Findings: 18 G core biopsy of left neck mass performed under US guidance.  Three core samples obtained and sent to Pathology.  Venetia Night. Kathlene Cote, M.D Pager:  [phone]

## 2020-08-11 NOTE — H&P (Signed)
Chief Complaint: Patient was seen in consultation today for left neck mass biopsy at the request of Rao,Archana C  Referring Physician(s): Rao,Archana C   Patient Status: [place] - Out-pt  History of Present Illness: Kyle Guerrero is a 49 y.o. male with a history of stage IV non-small cell lung carcinoma of the left upper lobe, status post chemotherapy and radiation therapy presenting with a large left neck mass after workup of left arm weakness. MRI of the cervical spine at [facility] in [place] on 07/27/20 demonstrates a large, destructive mass invading the left side of the cervical spine with involvement of multiple cervical nerve roots. Mass measures 8 cm in height. Now here for biopsy of presumed metastatic disease. Cannot move left arm but does have some use of the left hand.  Past Medical History:  Diagnosis Date  . Asthma   . Lung cancer [facility])     Past Surgical History:  Procedure Laterality Date  . CYST EXCISION    . IR IMAGING GUIDED PORT INSERTION  12/05/2019  . VIDEO BRONCHOSCOPY WITH ENDOBRONCHIAL ULTRASOUND Left 11/22/2019   Procedure: VIDEO BRONCHOSCOPY WITH ENDOBRONCHIAL ULTRASOUND;  Surgeon: Tyler Pita, MD;  Location: [facility] ORS;  Service: Thoracic;  Laterality: Left;    Allergies: Patient has no known allergies.  Medications: Prior to Admission medications   Medication Sig Start Date End Date Taking? Authorizing Provider  acetaminophen (TYLENOL) 325 MG tablet Take 650 mg by mouth every 6 (six) hours as needed.   Yes [provider]  azelastine (ASTELIN) 0.1 % nasal spray Place 1 spray into the nose as needed.  Patient not taking: Reported on 08/11/2020 11/28/19   [provider]  cyclobenzaprine (FLEXERIL) 5 MG tablet Take 5 mg by mouth 3 (three) times daily as needed.  Patient not taking: Reported on 08/11/2020 05/13/20   [provider]  sucralfate (CARAFATE) 1 g tablet Take 1 tablet (1 g total) by mouth 3 (three) times  daily before meals. Dissolve in warm water, swish and swallow Patient not taking: Reported on 07/02/2020 12/31/19   Noreene Filbert, MD  traMADol (ULTRAM) 50 MG tablet Take 50 mg by mouth every 6 (six) hours as needed. Patient not taking: Reported on 08/11/2020 06/22/20   [provider]  prochlorperazine (COMPAZINE) 10 MG tablet Take 1 tablet (10 mg total) by mouth every 6 (six) hours as needed (Nausea or vomiting). Patient not taking: Reported on 01/09/2020 11/29/19 02/04/20  Sindy Guadeloupe, MD     Family History  Problem Relation Age of Onset  . Healthy Mother   . Healthy Father     Social History   Socioeconomic History  . Marital status: Single    Spouse name: Not on file  . Number of children: Not on file  . Years of education: Not on file  . Highest education level: Not on file  Occupational History  . Not on file  Tobacco Use  . Smoking status: Former Smoker    Packs/day: 2.00    Types: Cigarettes    Quit date: 11/08/2019    Years since quitting: 0.7  . Smokeless tobacco: Never Used  Vaping Use  . Vaping Use: Never used  Substance and Sexual Activity  . Alcohol use: Not Currently  . Drug use: Yes    Types: Marijuana  . Sexual activity: Not on file  Other Topics Concern  . Not on file  Social History Narrative  . Not on file   Social Determinants of Health  Financial Resource Strain:   . Difficulty of Paying Living Expenses:   Food Insecurity:   . Worried About Charity fundraiser in the Last Year:   . Arboriculturist in the Last Year:   Transportation Needs:   . Film/video editor (Medical):   Marland Kitchen Lack of Transportation (Non-Medical):   Physical Activity:   . Days of Exercise per Week:   . Minutes of Exercise per Session:   Stress:   . Feeling of Stress :   Social Connections:   . Frequency of Communication with Friends and Family:   . Frequency of Social Gatherings with Friends and Family:   . Attends Religious Services:   . Active Member of  Clubs or Organizations:   . Attends Archivist Meetings:   Marland Kitchen Marital Status:     ECOG Status: 1 - Symptomatic but completely ambulatory  Review of Systems: A 12 point ROS discussed and pertinent positives are indicated in the HPI above.  All other systems are negative.  Review of Systems  Constitutional: Positive for activity change.  Respiratory: Negative.   Cardiovascular: Negative.   Gastrointestinal: Negative.   Genitourinary: Negative.   Musculoskeletal: Positive for neck pain and neck stiffness.       Unable to raise left arm  Neurological:       No motor use of upper left arm    Vital Signs: BP (!) 143/92   Pulse 92   Temp 97.6 F (36.4 C) (Oral)   Resp 18   Ht 5\' 11"  (1.803 m)   Wt 79.4 kg   SpO2 98%   BMI 24.41 kg/m   Physical Exam Constitutional:      General: He is not in acute distress.    Appearance: He is not toxic-appearing or diaphoretic.  Neck:     Comments: Palpable left neck mass Cardiovascular:     Rate and Rhythm: Normal rate and regular rhythm.     Heart sounds: Normal heart sounds. No murmur heard.  No friction rub. No gallop.   Pulmonary:     Effort: Pulmonary effort is normal. No respiratory distress.     Breath sounds: Normal breath sounds. No stridor. No wheezing, rhonchi or rales.  Abdominal:     General: Abdomen is flat. Bowel sounds are normal. There is no distension.     Palpations: Abdomen is soft. There is no mass.     Tenderness: There is no abdominal tenderness. There is no guarding or rebound.     Hernia: No hernia is present.  Musculoskeletal:        General: No swelling.     Cervical back: Rigidity present.  Skin:    General: Skin is warm and dry.  Neurological:     General: No focal deficit present.     Mental Status: He is alert and oriented to person, place, and time.     Imaging: DG Outside Films Head/Face  Result Date: 08/05/2020 This examination belongs to an outside facility and is stored here for  comparison purposes only.  Contact the originating outside institution for any associated report or interpretation.  DG Outside Films Head/Face  Result Date: 08/05/2020 This examination belongs to an outside facility and is stored here for comparison purposes only.  Contact the originating outside institution for any associated report or interpretation.  DG Outside Films Extremity  Result Date: 08/05/2020 This examination belongs to an outside facility and is stored here for comparison purposes only.  Contact the  originating outside institution for any associated report or interpretation.  DG Outside Films Extremity  Result Date: 08/05/2020 This examination belongs to an outside facility and is stored here for comparison purposes only.  Contact the originating outside institution for any associated report or interpretation.   Labs:  CBC: Recent Labs    05/22/20 0946 06/12/20 0843 07/03/20 0826 07/24/20 0930  WBC 4.6 5.1 6.0 6.3  HGB 14.1 13.6 13.0 12.3*  HCT 42.1 40.8 38.0* 36.7*  PLT 271 356 347 405*    COAGS: Recent Labs    12/05/19 0600  INR 1.1    BMP: Recent Labs    05/22/20 0946 06/12/20 0843 07/03/20 0826 07/24/20 0930  NA 136 137 135 134*  K 4.0 3.7 3.7 3.9  CL 103 101 100 100  CO2 26 25 24 26   GLUCOSE 87 138* 160* 99  BUN 15 16 12 19   CALCIUM 9.0 9.0 9.0 8.7*  CREATININE 0.96 0.96 0.87 0.85  GFRNONAA >60 >60 >60 >60  GFRAA >60 >60 >60 >60    LIVER FUNCTION TESTS: Recent Labs    05/22/20 0946 06/12/20 0843 07/03/20 0826 07/24/20 0930  BILITOT 0.5 0.6 0.5 0.4  AST 21 23 27 19   ALT 23 27 28 20   ALKPHOS 101 105 114 113  PROT 7.6 8.1 8.0 7.6  ALBUMIN 4.0 3.9 3.7 3.5    TUMOR MARKERS: No results for input(s): AFPTM, CEA, CA199, CHROMGRNA in the last 8760 hours.  Assessment and Plan:  For US guided biopsy of left neck mass today. Risks and benefits of biopsy was discussed with the patient and/or patient's family including, but not limited  to bleeding, infection, damage to adjacent structures or low yield requiring additional tests.  All of the questions were answered and there is agreement to proceed.  Consent signed and in chart.   Thank you for this interesting consult.  I greatly enjoyed meeting LOGAN BALTIMORE and look forward to participating in their care.  A copy of this report was sent to the requesting provider on this date.  Electronically Signed: Azzie Roup, MD 08/11/2020, 11:21 AM   I spent a total of  15 Minutes  in face to face in clinical consultation, greater than 50% of which was counseling/coordinating care for left neck mass biopsy.

## 2020-08-12 ENCOUNTER — Other Ambulatory Visit: Payer: Self-pay | Admitting: Pathology

## 2020-08-12 ENCOUNTER — Other Ambulatory Visit: Payer: Self-pay

## 2020-08-12 ENCOUNTER — Ambulatory Visit
Admission: RE | Admit: 2020-08-12 | Discharge: 2020-08-12 | Disposition: A | Discharge status: Home / Self Care | Payer: 59 | Source: Ambulatory Visit | Attending: Oncology | Admitting: Oncology

## 2020-08-12 DIAGNOSIS — C3490 Malignant neoplasm of unspecified part of unspecified bronchus or lung: Secondary | ICD-10-CM | POA: Diagnosis present

## 2020-08-12 DIAGNOSIS — R221 Localized swelling, mass and lump, neck: Secondary | ICD-10-CM | POA: Insufficient documentation

## 2020-08-12 LAB — GLUCOSE, CAPILLARY: Glucose-Capillary: 96 mg/dL (ref 70–99)

## 2020-08-12 LAB — SURGICAL PATHOLOGY

## 2020-08-12 IMAGING — CT NM PET TUM IMG RESTAG (PS) SKULL BASE T - THIGH
1 of 9 series · 1 of 25 positions shown · non-contrast
Comparison: Multiple exams, including cervical MRI from [DATE]
and CT examination from [DATE]

CLINICAL DATA: Subsequent treatment strategy for lung cancer. Also
initial workup of neck mass.

EXAM:
NUCLEAR MEDICINE PET SKULL BASE TO THIGH
TECHNIQUE: 8.9 mCi F-18 FDG was injected intravenously. Full-ring PET imaging
was performed from the skull base to thigh after the radiotracer. CT
data was obtained and used for attenuation correction and anatomic
localization.
Fasting blood glucose: 96 mg/dl

[Series 4: ct wb 5.0 b30f · axial · 5.0mm · 0.98mm/px · 1 of 290 slices shown]
[im 290/290  brain]
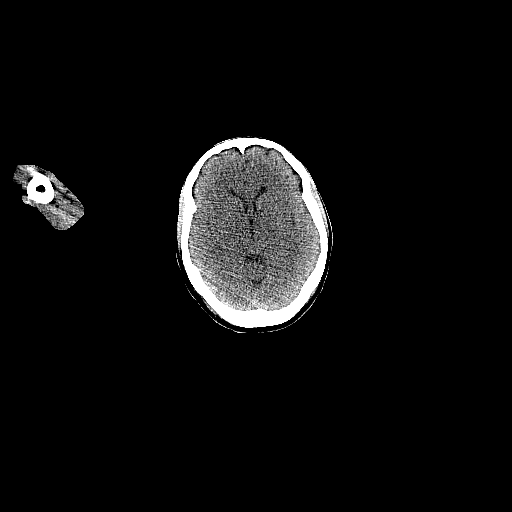

[1 of 25 positions shown; findings below may reference images not displayed]

FINDINGS: Mediastinal blood pool activity: SUV max

Liver activity: SUV max N/A

NECK: Large left neck mass primarily in the prevertebral space but
displacing adjacent spaces observed with apparent erosion into the
C3, C4, C5, C6, and possibly C7 vertebral bodies, and with known
extension into the left neural foramina at C4-5, C5-6, and possibly
C6-7. This mass has a maximum SUV of 20.3, and associated
hypermetabolic activity measures approximately 6.8 by 6.1 by 9.3 cm.

In the areas of mild T2 central cervical spinal cord signal
abnormality most notably at the T6-7 level on the outside MRI from
[DATE], I not discern definite abnormal metabolic activity
within the cord, although admittedly the signal abnormality the
small.

Incidental CT findings: none

CHEST: Left supraclavicular node 1.7 cm in short axis on image 65/4,
maximum SUV 16.7, compatible with malignancy.

Bandlike 1.5 by 1.1 by 3.3 cm left upper lobe nodule appears
essentially stable and has a hypermetabolic activity of only 1.4.
There is some new indistinct airspace opacity posteriorly in the
left upper lobe along the major fissure. This is not hypermetabolic
and is likely inflammatory.

Reduced conspicuity of the previously 4 mm nodule in the left upper
lobe, now barely apparent and perhaps 2 mm in diameter.

Incidental CT findings: Small band of chronic scarring in the
superior segment left lower lobe, unchanged. Airway thickening is
most striking in the left upper lobe.

Right Port-A-Cath tip: Cavoatrial junction.

ABDOMEN/PELVIS: Accentuated activity in the cecum without CT
correlate, likely physiologic. Activity in this vicinity measures up
to 13.0.

Incidental CT findings: none

SKELETON: Small lytic lesion of the left anterior third rib
measuring about 0.8 by 0.7 cm with surrounding sclerosis and
low-grade metabolic activity, maximum SUV 3.7. On prior exams such
[DATE] there is pleural-based mass immediately adjacent to this
rib, and I suspect that the current erosion and activity reflects a
small amount of malignant invasion of this rib. However, the
previous pleural thickening has resolved.

Incidental CT findings: none
IMPRESSION: 1. Large left prevertebral space mass as shown on recent outside
cervical spine MRI from [REDACTED] [REDACTED] of
[DATE], maximum SUV 20.3, compatible with active malignancy and
invading multiple vertebral bodies and neural foramina.
2. 1.7 cm in short axis left supraclavicular node with maximum SUV
16.7, compatible with active malignancy.
3. The persistent left upper lobe bandlike nodule in the apical
segment has a maximum SUV of only 1.4, indicating response to
treatment. Although the adjacent pleural thickening has
substantially reduced over the last 10 months, there is a small
lytic erosion of the left anterior third rib with adjacent sclerosis
and low-grade metabolic activity likely representing residuum from
malignant invasion of the rib.
4. Minimal indistinct airspace opacity posteriorly in the left upper
lobe along the major fissure is not hypermetabolic and is likely
inflammatory. There is some airway thickening most striking in the
left upper lobe, possibly treatment related.
5. Focally accentuated activity in the cecum, but without CT
correlate hence likely physiologic.

## 2020-08-12 MED ORDER — FLUDEOXYGLUCOSE F - 18 (FDG) INJECTION
8.7000 | Freq: Once | INTRAVENOUS | Status: AC | PRN
  Administered 2020-08-12: 8.85 via INTRAVENOUS

## 2020-08-14 ENCOUNTER — Encounter: Payer: Self-pay | Admitting: Oncology

## 2020-08-14 ENCOUNTER — Inpatient Hospital Stay: Payer: 59

## 2020-08-14 ENCOUNTER — Other Ambulatory Visit: Payer: Self-pay

## 2020-08-14 ENCOUNTER — Inpatient Hospital Stay: Payer: 59 | Attending: Oncology

## 2020-08-14 ENCOUNTER — Inpatient Hospital Stay (HOSPITAL_BASED_OUTPATIENT_CLINIC_OR_DEPARTMENT_OTHER): Payer: 59 | Admitting: Oncology

## 2020-08-14 VITALS — BP 131/95 | HR 98 | Temp 96.3°F | Resp 18 | Wt 170.6 lb

## 2020-08-14 DIAGNOSIS — C782 Secondary malignant neoplasm of pleura: Secondary | ICD-10-CM | POA: Diagnosis not present

## 2020-08-14 DIAGNOSIS — G893 Neoplasm related pain (acute) (chronic): Secondary | ICD-10-CM

## 2020-08-14 DIAGNOSIS — C3412 Malignant neoplasm of upper lobe, left bronchus or lung: Secondary | ICD-10-CM | POA: Insufficient documentation

## 2020-08-14 DIAGNOSIS — J45909 Unspecified asthma, uncomplicated: Secondary | ICD-10-CM | POA: Insufficient documentation

## 2020-08-14 DIAGNOSIS — Z9221 Personal history of antineoplastic chemotherapy: Secondary | ICD-10-CM | POA: Diagnosis not present

## 2020-08-14 DIAGNOSIS — R221 Localized swelling, mass and lump, neck: Secondary | ICD-10-CM | POA: Diagnosis not present

## 2020-08-14 DIAGNOSIS — Z87891 Personal history of nicotine dependence: Secondary | ICD-10-CM | POA: Diagnosis not present

## 2020-08-14 DIAGNOSIS — Z7189 Other specified counseling: Secondary | ICD-10-CM

## 2020-08-14 DIAGNOSIS — M25512 Pain in left shoulder: Secondary | ICD-10-CM | POA: Diagnosis not present

## 2020-08-14 DIAGNOSIS — Z923 Personal history of irradiation: Secondary | ICD-10-CM | POA: Diagnosis not present

## 2020-08-14 DIAGNOSIS — Z5111 Encounter for antineoplastic chemotherapy: Secondary | ICD-10-CM | POA: Insufficient documentation

## 2020-08-14 DIAGNOSIS — R531 Weakness: Secondary | ICD-10-CM | POA: Insufficient documentation

## 2020-08-14 DIAGNOSIS — Z79899 Other long term (current) drug therapy: Secondary | ICD-10-CM | POA: Insufficient documentation

## 2020-08-14 LAB — CBC WITH DIFFERENTIAL/PLATELET
Abs Immature Granulocytes: 0.02 10*3/uL (ref 0.00–0.07)
Basophils Absolute: 0 10*3/uL (ref 0.0–0.1)
Basophils Relative: 0 %
Eosinophils Absolute: 0.2 10*3/uL (ref 0.0–0.5)
Eosinophils Relative: 2 %
HCT: 35.2 % — ABNORMAL LOW (ref 39.0–52.0)
Hemoglobin: 11.9 g/dL — ABNORMAL LOW (ref 13.0–17.0)
Immature Granulocytes: 0 %
Lymphocytes Relative: 23 %
Lymphs Abs: 1.6 10*3/uL (ref 0.7–4.0)
MCH: 29.3 pg (ref 26.0–34.0)
MCHC: 33.8 g/dL (ref 30.0–36.0)
MCV: 86.7 fL (ref 80.0–100.0)
Monocytes Absolute: 0.7 10*3/uL (ref 0.1–1.0)
Monocytes Relative: 10 %
Neutro Abs: 4.5 10*3/uL (ref 1.7–7.7)
Neutrophils Relative %: 65 %
Platelets: 434 10*3/uL — ABNORMAL HIGH (ref 150–400)
RBC: 4.06 MIL/uL — ABNORMAL LOW (ref 4.22–5.81)
RDW: 13.7 % (ref 11.5–15.5)
WBC: 6.9 10*3/uL (ref 4.0–10.5)
nRBC: 0 % (ref 0.0–0.2)

## 2020-08-14 LAB — COMPREHENSIVE METABOLIC PANEL
ALT: 31 U/L (ref 0–44)
AST: 33 U/L (ref 15–41)
Albumin: 3.7 g/dL (ref 3.5–5.0)
Alkaline Phosphatase: 126 U/L (ref 38–126)
Anion gap: 10 (ref 5–15)
BUN: 14 mg/dL (ref 6–20)
CO2: 25 mmol/L (ref 22–32)
Calcium: 9 mg/dL (ref 8.9–10.3)
Chloride: 100 mmol/L (ref 98–111)
Creatinine, Ser: 0.89 mg/dL (ref 0.61–1.24)
GFR calc Af Amer: >60 mL/min (ref >60)
GFR calc non Af Amer: >60 mL/min (ref >60)
Glucose, Bld: 108 mg/dL — ABNORMAL HIGH (ref 70–99)
Potassium: 3.6 mmol/L (ref 3.5–5.1)
Sodium: 135 mmol/L (ref 135–145)
Total Bilirubin: 0.5 mg/dL (ref 0.3–1.2)
Total Protein: 8.1 g/dL (ref 6.5–8.1)

## 2020-08-14 LAB — TSH: TSH: 0.611 u[IU]/mL (ref 0.350–4.500)

## 2020-08-14 MED ORDER — FOLIC ACID 1 MG PO TABS: 1.0000 mg | ORAL_TABLET | Freq: Every day | ORAL | 3 refills | Status: DC

## 2020-08-14 MED ORDER — LIDOCAINE-PRILOCAINE 2.5-2.5 % EX CREA: TOPICAL_CREAM | CUTANEOUS | 3 refills | Status: DC

## 2020-08-14 MED ORDER — DEXAMETHASONE 4 MG PO TABS
4.0000 mg | ORAL_TABLET | Freq: Two times a day (BID) | ORAL | 0 refills | Status: DC
Start: 2020-08-14 — End: 2020-10-25

## 2020-08-14 MED ORDER — PROCHLORPERAZINE MALEATE 10 MG PO TABS: 10.0000 mg | ORAL_TABLET | Freq: Four times a day (QID) | ORAL | 1 refills | Status: DC | PRN

## 2020-08-14 MED ORDER — ONDANSETRON HCL 8 MG PO TABS: 8.0000 mg | ORAL_TABLET | Freq: Two times a day (BID) | ORAL | 1 refills | Status: DC | PRN

## 2020-08-14 MED ORDER — OXYCODONE HCL 5 MG PO TABS: 5.0000 mg | ORAL_TABLET | ORAL | 0 refills | Status: DC | PRN

## 2020-08-14 NOTE — Progress Notes (Signed)
Hematology/Oncology Consult note [place]  [phone] [phone]  Patient Care Team: Default, Provider, MD as PCP - General Telford Nab, RN as Registered Nurse Sindy Guadeloupe, MD as Consulting Physician (Hematology and Oncology) Sindy Guadeloupe, MD as Consulting Physician (Hematology and Oncology)   Name of the patient: Kyle Guerrero  [id number]  1971/12/07   Date of visit: 08/14/20  Diagnosis-  Non-small cell lung cancer stage IV acT2 cN2 cM1 a with pleural involvement  Chief complaint/ Reason for visit-discuss PET CT scan biopsy results and further management  Heme/Onc history: patient is a 49 year old male who presented to the ER with symptoms ofheaviness in his chest and upper left chest discomfort he underwent CT angio chest to rule out PE which showed 3.8 x 3.3 cm left upper palpable lung mass along with 4.4 x 3.3 cm lobulated mass in the aortopulmonary window and 2.6 x 2.4 cm left hilar mass all concerning for malignancy. Patient has also seen pulmonary and has been set up for bronchoscopy and EBUS guided biopsy on 11/23/2019. Patient underwent. PET CT scan which showed a hypermetabolic spiculated 3.5 cm of 5 left upper lobe lung mass, adjacent hypermetabolic 3.2 x 1.2 cm pleural metastases in the medial posterior by the left pleural space along with scalloping of the adjacent posterior left third rib. Hypermetabolic infiltrative left perihilar conglomerate nodal metastases measuring up to 7.3 x 3.6 cm and 0.8 cm high left mediastinal node between the left brachiocephalic vein and left subclavian artery. No evidence of distant metastatic disease  Biopsy showed non-small cell lung cancer but further characterization could not be determined. Insufficient tissue for NGS testing. Repeat biopsy done. Results of NG[place]estingshowed PD-L1 50%. Tumor mutational burden high. ERBB2 copy number again.NGS testing on peripheral blood  showed NTRK mutation.  Patient completed concurrent chemoradiation with carbotaxol chemotherapy followed by 2 cycles of carbotaxol Keytruda and now on maintenance Keytruda  Patient was complaining of left shoulder pain and underwent MRI of the shoulder which showed 8 in the supraspinatus tendon.  He was subsequently seen by [facility] and underwent MRI of the cervical spine which showed a 4.6 x 4.8 x 8.2 cm mass in the left prevertebral region from C2-C7 levels.  The mass partially encases the left vertebral artery and abuts the preforaminal segment.  Invades the vertebral bodies and edema of the left C5 articular pillar.  There is slight extension into the left C4-C5 thecal sac without central thecal sac compression.  The mass displaces the left pharynx anteriorly along the left carotid sheath.  Patient had a repeat biopsy which was consistent with adenocarcinoma.  Interval history-currently patient reports significant pain in his left upper extremity especially since the biopsy with associated swelling of his left neck.  Reports some ongoing weakness in the left arm as well.  Denies other complaints  ECOG PS- 1 Pain scale- 9   Review of systems- Review of Systems  Constitutional: Negative for chills, fever, malaise/fatigue and weight loss.  HENT: Negative for congestion, ear discharge and nosebleeds.   Eyes: Negative for blurred vision.  Respiratory: Negative for cough, hemoptysis, sputum production, shortness of breath and wheezing.   Cardiovascular: Negative for chest pain, palpitations, orthopnea and claudication.  Gastrointestinal: Negative for abdominal pain, blood in stool, constipation, diarrhea, heartburn, melena, nausea and vomiting.  Genitourinary: Negative for dysuria, flank pain, frequency, hematuria and urgency.  Musculoskeletal: Negative for back pain, joint pain and myalgias.       Left neck  swelling and pain/weakness in the left arm  Skin: Negative for rash.   Neurological: Negative for dizziness, tingling, focal weakness, seizures, weakness and headaches.  Endo/Heme/Allergies: Does not bruise/bleed easily.  Psychiatric/Behavioral: Negative for depression and suicidal ideas. The patient does not have insomnia.       No Known Allergies   Past Medical History:  Diagnosis Date  . Asthma   . Lung cancer [facility])      Past Surgical History:  Procedure Laterality Date  . CYST EXCISION    . IR IMAGING GUIDED PORT INSERTION  12/05/2019  . VIDEO BRONCHOSCOPY WITH ENDOBRONCHIAL ULTRASOUND Left 11/22/2019   Procedure: VIDEO BRONCHOSCOPY WITH ENDOBRONCHIAL ULTRASOUND;  Surgeon: Tyler Pita, MD;  Location: [facility] ORS;  Service: Thoracic;  Laterality: Left;    Social History   Socioeconomic History  . Marital status: Single    Spouse name: Not on file  . Number of children: Not on file  . Years of education: Not on file  . Highest education level: Not on file  Occupational History  . Not on file  Tobacco Use  . Smoking status: Former Smoker    Packs/day: 2.00    Types: Cigarettes    Quit date: 11/08/2019    Years since quitting: 0.7  . Smokeless tobacco: Never Used  Vaping Use  . Vaping Use: Never used  Substance and Sexual Activity  . Alcohol use: Not Currently  . Drug use: Yes    Types: Marijuana  . Sexual activity: Not on file  Other Topics Concern  . Not on file  Social History Narrative  . Not on file   Social Determinants of Health   Financial Resource Strain:   . Difficulty of Paying Living Expenses: Not on file  Food Insecurity:   . Worried About Charity fundraiser in the Last Year: Not on file  . Ran Out of Food in the Last Year: Not on file  Transportation Needs:   . Lack of Transportation (Medical): Not on file  . Lack of Transportation (Non-Medical): Not on file  Physical Activity:   . Days of Exercise per Week: Not on file  . Minutes of Exercise per Session: Not on file  Stress:   . Feeling of Stress :  Not on file  Social Connections:   . Frequency of Communication with Friends and Family: Not on file  . Frequency of Social Gatherings with Friends and Family: Not on file  . Attends Religious Services: Not on file  . Active Member of Clubs or Organizations: Not on file  . Attends Archivist Meetings: Not on file  . Marital Status: Not on file  Intimate Partner Violence:   . Fear of Current or Ex-Partner: Not on file  . Emotionally Abused: Not on file  . Physically Abused: Not on file  . Sexually Abused: Not on file    Family History  Problem Relation Age of Onset  . Healthy Mother   . Healthy Father      Current Outpatient Medications:  .  acetaminophen (TYLENOL) 325 MG tablet, Take 650 mg by mouth every 6 (six) hours as needed., Disp: , Rfl:  .  azelastine (ASTELIN) 0.1 % nasal spray, Place 1 spray into the nose as needed. , Disp: , Rfl:  .  cyclobenzaprine (FLEXERIL) 5 MG tablet, Take 5 mg by mouth 3 (three) times daily as needed. , Disp: , Rfl:  .  sucralfate (CARAFATE) 1 g tablet, Take 1 tablet (1 g total)  by mouth 3 (three) times daily before meals. Dissolve in warm water, swish and swallow, Disp: 90 tablet, Rfl: 6 .  traMADol (ULTRAM) 50 MG tablet, Take 50 mg by mouth every 6 (six) hours as needed. , Disp: , Rfl:  .  dexamethasone (DECADRON) 4 MG tablet, Take 1 tablet (4 mg total) by mouth 2 (two) times daily with a meal., Disp: 60 tablet, Rfl: 0 .  folic acid (FOLVITE) 1 MG tablet, Take 1 tablet (1 mg total) by mouth daily. Start 5-7 days before [name] chemotherapy. Continue until 21 days after [name] completed., Disp: 100 tablet, Rfl: 3 .  lidocaine-prilocaine (EMLA) cream, Apply to affected area once, Disp: 30 g, Rfl: 3 .  lidocaine-prilocaine (EMLA) cream, Apply to affected area once, Disp: 30 g, Rfl: 3 .  ondansetron (ZOFRAN) 8 MG tablet, Take 1 tablet (8 mg total) by mouth 2 (two) times daily as needed (Nausea or vomiting). Start if needed on the third day after  chemotherapy., Disp: 30 tablet, Rfl: 1 .  oxyCODONE (OXY IR/ROXICODONE) 5 MG immediate release tablet, Take 1 tablet (5 mg total) by mouth every 4 (four) hours as needed for moderate pain or severe pain., Disp: 180 tablet, Rfl: 0 .  prochlorperazine (COMPAZINE) 10 MG tablet, Take 1 tablet (10 mg total) by mouth every 6 (six) hours as needed (Nausea or vomiting)., Disp: 30 tablet, Rfl: 1 No current facility-administered medications for this visit.  Facility-Administered Medications Ordered in Other Visits:  .  sodium chloride flush (NS) 0.9 % injection 10 mL, 10 mL, Intravenous, PRN, Sindy Guadeloupe, MD, 10 mL at 05/01/20 0900  Physical exam:  Vitals:   08/14/20 0958  BP: (!) 131/95  Pulse: 98  Resp: 18  Temp: (!) 96.3 F (35.7 C)  TempSrc: Tympanic  SpO2: 100%  Weight: 170 lb 9.6 oz (77.4 kg)   Physical Exam Eyes:     Pupils: Pupils are equal, round, and reactive to light.  Neck:     Comments: Left neck swelling noted.   Cardiovascular:     Rate and Rhythm: Normal rate and regular rhythm.     Heart sounds: Normal heart sounds.  Pulmonary:     Effort: Pulmonary effort is normal.     Breath sounds: Normal breath sounds.  Abdominal:     General: Bowel sounds are normal.     Palpations: Abdomen is soft.  Musculoskeletal:     Comments: Left arm and brace and patient unable to lift it over head  Skin:    General: Skin is warm and dry.  Neurological:     Mental Status: He is alert and oriented to person, place, and time.      CMP Latest Ref Rng & Units 08/14/2020  Glucose 70 - 99 mg/dL 108(H)  BUN 6 - 20 mg/dL 14  Creatinine 0.61 - 1.24 mg/dL 0.89  Sodium 135 - 145 mmol/L 135  Potassium 3.5 - 5.1 mmol/L 3.6  Chloride 98 - 111 mmol/L 100  CO2 22 - 32 mmol/L 25  Calcium 8.9 - 10.3 mg/dL 9.0  Total Protein 6.5 - 8.1 g/dL 8.1  Total Bilirubin 0.3 - 1.2 mg/dL 0.5  Alkaline Phos 38 - 126 U/L 126  AST 15 - 41 U/L 33  ALT 0 - 44 U/L 31   CBC Latest Ref Rng & Units 08/14/2020   WBC 4.0 - 10.5 K/uL 6.9  Hemoglobin 13.0 - 17.0 g/dL 11.9(L)  Hematocrit 39 - 52 % 35.2(L)  Platelets 150 - 400 K/uL  434(H)      NM PET Image Restag (PS) Skull Base To Thigh  Result Date: 08/13/2020 CLINICAL DATA:  Subsequent treatment strategy for lung cancer. Also initial workup of neck mass. EXAM: NUCLEAR MEDICINE PET SKULL BASE TO THIGH TECHNIQUE: 8.9 mCi F-18 FDG was injected intravenously. Full-ring PET imaging was performed from the skull base to thigh after the radiotracer. CT data was obtained and used for attenuation correction and anatomic localization. Fasting blood glucose: 96 mg/dl COMPARISON:  Multiple exams, including cervical MRI from 07/27/2020 and CT examination from 06/11/2020 FINDINGS: Mediastinal blood pool activity: SUV max 2.3 Liver activity: SUV max N/A NECK: Large left neck mass primarily in the prevertebral space but displacing adjacent spaces observed with apparent erosion into the C3, C4, C5, C6, and possibly C7 vertebral bodies, and with known extension into the left neural foramina at C4-5, C5-6, and possibly C6-7. This mass has a maximum SUV of 20.3, and associated hypermetabolic activity measures approximately 6.8 by 6.1 by 9.3 cm. In the areas of mild T2 central cervical spinal cord signal abnormality most notably at the T6-7 level on the outside MRI from 07/27/2020, I not discern definite abnormal metabolic activity within the cord, although admittedly the signal abnormality the small. Incidental CT findings: none CHEST: Left supraclavicular node 1.7 cm in short axis on image 65/4, maximum SUV 16.7, compatible with malignancy. Bandlike 1.5 by 1.1 by 3.3 cm left upper lobe nodule appears essentially stable and has a hypermetabolic activity of only 1.4. There is some new indistinct airspace opacity posteriorly in the left upper lobe along the major fissure. This is not hypermetabolic and is likely inflammatory. Reduced conspicuity of the previously 4 mm nodule in the left  upper lobe, now barely apparent and perhaps 2 mm in diameter. Incidental CT findings: Small band of chronic scarring in the superior segment left lower lobe, unchanged. Airway thickening is most striking in the left upper lobe. Right Port-A-Cath tip: Cavoatrial junction. ABDOMEN/PELVIS: Accentuated activity in the cecum without CT correlate, likely physiologic. Activity in this vicinity measures up to 13.0. Incidental CT findings: none SKELETON: Small lytic lesion of the left anterior third rib measuring about 0.8 by 0.7 cm with surrounding sclerosis and low-grade metabolic activity, maximum SUV 3.7. On prior exams such 11/18/2019 there is pleural-based mass immediately adjacent to this rib, and I suspect that the current erosion and activity reflects a small amount of malignant invasion of this rib. However, the previous pleural thickening has resolved. Incidental CT findings: none IMPRESSION: 1. Large left prevertebral space mass as shown on recent outside cervical spine MRI from [facility] of 08/05/2020, maximum SUV 20.3, compatible with active malignancy and invading multiple vertebral bodies and neural foramina. 2. 1.7 cm in short axis left supraclavicular node with maximum SUV 16.7, compatible with active malignancy. 3. The persistent left upper lobe bandlike nodule in the apical segment has a maximum SUV of only 1.4, indicating response to treatment. Although the adjacent pleural thickening has substantially reduced over the last 10 months, there is a small lytic erosion of the left anterior third rib with adjacent sclerosis and low-grade metabolic activity likely representing residuum from malignant invasion of the rib. 4. Minimal indistinct airspace opacity posteriorly in the left upper lobe along the major fissure is not hypermetabolic and is likely inflammatory. There is some airway thickening most striking in the left upper lobe, possibly treatment related. 5. Focally accentuated  activity in the cecum, but without CT correlate hence likely physiologic. Electronically Signed  By: Van Clines M.D.   On: 08/13/2020 09:21   [place] CORE BIOPSY (SOFT TISSUE)  Result Date: 08/11/2020 CLINICAL DATA:  History of treated lung carcinoma with large left neck mass most likely representing recurrent metastatic disease. EXAM: ULTRASOUND GUIDED CORE BIOPSY OF LEFT NECK MASS MEDICATIONS: 2.0 mg IV Versed; 100 mcg IV Fentanyl Total Moderate Sedation Time: 11 minutes. The patient's level of consciousness and physiologic status were continuously monitored during the procedure by Radiology nursing. PROCEDURE: The procedure, risks, benefits, and alternatives were explained to the patient. Questions regarding the procedure were encouraged and answered. The patient understands and consents to the procedure. A time out was performed prior to initiating the procedure. Ultrasound was performed of the left neck. The left neck was prepped with chlorhexidine in a sterile fashion, and a sterile drape was applied covering the operative field. A sterile gown and sterile gloves were used for the procedure. Local anesthesia was provided with 1% Lidocaine. Under ultrasound guidance, 18 gauge core biopsy samples were obtained of a left neck mass. Three separate core biopsy samples were obtained and submitted in formalin. Additional ultrasound was performed. COMPLICATIONS: None. FINDINGS: Large solid mass in the left neck occupies much of the lateral and posterolateral neck and measures greater than 7 cm in maximal diameter by ultrasound. Solid tissue was obtained. IMPRESSION: Ultrasound-guided core biopsy performed of a large solid mass in the left neck. Electronically Signed   By: Aletta Edouard M.D.   On: 08/11/2020 12:43   DG Outside Films Head/Face  Result Date: 08/05/2020 This examination belongs to an outside facility and is stored here for comparison purposes only.  Contact the originating outside  institution for any associated report or interpretation.  DG Outside Films Head/Face  Result Date: 08/05/2020 This examination belongs to an outside facility and is stored here for comparison purposes only.  Contact the originating outside institution for any associated report or interpretation.  DG Outside Films Extremity  Result Date: 08/05/2020 This examination belongs to an outside facility and is stored here for comparison purposes only.  Contact the originating outside institution for any associated report or interpretation.  DG Outside Films Extremity  Result Date: 08/05/2020 This examination belongs to an outside facility and is stored here for comparison purposes only.  Contact the originating outside institution for any associated report or interpretation.    Assessment and plan- Patient is a 49 y.o. male  with stage IV adenocarcinoma of the lung with pleural metastases.    Is here to discuss PET CT scan repeat biopsy results and further management  PET CT scan shows interval new development of paravertebral mass About 9 cm in size between C2-C7 lesions.  There is no other evidence of progression of disease elsewhere.  This mass is currently not operable and he is seeing radiation oncology on Monday, 08/17/2020.  In the interim I will start him on Decadron 4 mg twice daily as well as oxycodone 5 mg every 4 as needed for his ongoing pain.  I will also hold off on giving him any further Keytruda at this time and plan to switch him to carboplatin and [name] starting next week.  Carboplatin will be given at AUC 5 along with [name] 500 mg per metered squared every 3 weeks for 4 cycles followed by [name] single agent until progression or toxicity.  Again discussed risks and benefits of chemotherapy including all but not limited to nausea, vomiting, low blood counts, risk of infections and hospitalizations.  He will  also get B12 injections every [redacted] weeks along with chemotherapy and will start  taking folic acid daily.  I will see him next week to assess his ongoing pain and adjust his pain medications as needed  I will plan to send of repeat NGS testing on his tumor specimen.  His original biopsy showed PD-L1 of 50% but no other actionable mutations although his NGS testing on peripheral blood at diagnosis had shown NTRK mutation.    Visit Diagnosis 1. Malignant neoplasm of upper lobe of left lung ([place])   2. Goals of care, counseling/discussion   3. Neoplasm related pain      Dr. Randa Evens, MD, MPH [facility] [phone] 08/14/2020 10:44 AM

## 2020-08-14 NOTE — Progress Notes (Signed)
DISCONTINUE OFF PATHWAY REGIMEN - Other   [id number]:Carboplatin AUC=6 IV D1 + Paclitaxel 200 mg/m2 IV D1 + Pembrolizumab 200 mg IV D1 q21 Days x 4 Cycles:   A cycle is every 21 days:     Pembrolizumab      Paclitaxel      Carboplatin   **Always confirm dose/schedule in your pharmacy ordering system**  REASON: Disease Progression PRIOR TREATMENT: Carboplatin AUC=6 IV D1 + Paclitaxel 200 mg/m2 IV D1 + Pembrolizumab 200 mg IV D1 q21 Days x 4 Cycles TREATMENT RESPONSE: Progressive Disease (PD)  START OFF PATHWAY REGIMEN - Other   [id number]:Carboplatin AUC=5 + Pemetrexed 500 mg/m2 q21 Days:   A cycle is every 21 days:     Pemetrexed      Carboplatin   **Always confirm dose/schedule in your pharmacy ordering system**  Patient Characteristics: Intent of Therapy: Non-Curative / Palliative Intent, Discussed with Patient

## 2020-08-17 ENCOUNTER — Other Ambulatory Visit: Payer: Self-pay

## 2020-08-17 ENCOUNTER — Encounter: Payer: Self-pay | Admitting: Radiation Oncology

## 2020-08-17 ENCOUNTER — Ambulatory Visit
Admission: RE | Admit: 2020-08-17 | Discharge: 2020-08-17 | Disposition: A | Discharge status: Home / Self Care | Payer: 59 | Source: Ambulatory Visit | Attending: Radiation Oncology | Admitting: Radiation Oncology

## 2020-08-17 ENCOUNTER — Encounter: Payer: Self-pay | Admitting: *Deleted

## 2020-08-17 DIAGNOSIS — C782 Secondary malignant neoplasm of pleura: Secondary | ICD-10-CM | POA: Insufficient documentation

## 2020-08-17 DIAGNOSIS — M79602 Pain in left arm: Secondary | ICD-10-CM | POA: Diagnosis not present

## 2020-08-17 DIAGNOSIS — M79642 Pain in left hand: Secondary | ICD-10-CM | POA: Diagnosis not present

## 2020-08-17 DIAGNOSIS — R531 Weakness: Secondary | ICD-10-CM | POA: Diagnosis not present

## 2020-08-17 DIAGNOSIS — C3412 Malignant neoplasm of upper lobe, left bronchus or lung: Secondary | ICD-10-CM | POA: Insufficient documentation

## 2020-08-17 DIAGNOSIS — Z87891 Personal history of nicotine dependence: Secondary | ICD-10-CM | POA: Insufficient documentation

## 2020-08-17 DIAGNOSIS — C770 Secondary and unspecified malignant neoplasm of lymph nodes of head, face and neck: Secondary | ICD-10-CM | POA: Diagnosis not present

## 2020-08-17 DIAGNOSIS — Z9221 Personal history of antineoplastic chemotherapy: Secondary | ICD-10-CM | POA: Insufficient documentation

## 2020-08-17 DIAGNOSIS — R221 Localized swelling, mass and lump, neck: Secondary | ICD-10-CM

## 2020-08-17 NOTE — Progress Notes (Signed)
  Oncology Nurse Navigator Documentation  Navigator Location: [facility] (08/17/20 1500)   )Navigator Encounter Type: Follow-up Appt (08/17/20 1500)                     Patient Visit Type: RadOnc (08/17/20 1500)   Barriers/Navigation Needs: Pain (08/17/20 1500)   Interventions: Education (08/17/20 1500)     Education Method: Verbal (08/17/20 1500)       met with patient during follow up visit with Dr. Baruch Gouty. Follow up visit made to check in on pt's pain management. Pt states that he has been taking his pain meds as prescribed by Dr. Janese Banks and that they have been helping slightly. Informed pt that per Dr. Janese Banks, she can increase his meds if they are not helping relieve his pain at this time. Pt stated that he would like to see how the radiation does before increasing his pain medication. Asked pt if he was taking the steroid as prescribed last week and he said that he has only taken one tablet since he wasn't sure how long he needed to take it. Educated patient about the mechanism of action of the steroid and that he should take it as prescribed to help with the swelling and pain around his neck mass. Advised pt to take one tablet in the morning and one in the mid-afternoon so it will not disrupt his sleep pattern as much. Also, informed pt that there is an occupational therapist that evaluates pts in the clinic on Wednesday that could help provide exercises to increase strength/mobility in his left arm. Pt declined and wanted to wait until after radiation before seeing OT. Pt instructed to call if his symptoms worsen or if he notices any new symptoms that need to be addressed. Pt verbalized understanding.   Dr. Janese Banks made aware of the above conversation with patient.          Time Spent with Patient: 30 (08/17/20 1500)

## 2020-08-17 NOTE — Progress Notes (Signed)
Radiation Oncology Follow up Note (OP N/A) old patient new area recurrent lung cancer  Name: Kyle Guerrero   Date:   08/17/2020 MRN:  [id number] DOB: 06/21/1971    This 49 y.o. male presents to the clinic today for reevaluation in patient with stage IV non-small cell lung cancer treated back in early 2021 for stage IV (T2 N2 M1) with pleural involvement of non-small cell lung cancer now with left neck recurrence.  REFERRING PROVIDER: No ref. provider found  HPI: Patient is a 49 year old male well-known to our department having received concurrent chemo with radiation for stage IV non-small cell lung cancer back in early 2021.Marland Kitchen  He initially had a 3.5 cm left upper lobe mass with adjacent hypermetabolic pleural metastasis as well as mediastinal adenopathy.  He had nodal metastasis measuring up to 7.3 cm in the left mediastinal region.  He had no evidence of distant metastasis.  Patient completed concurrent chemotherapy and radiation therapy with carbotaxol followed by Loretha Brasil and was on maintenance Keytruda.  He started having left shoulder pain MRI showed an lesion in the supraspinatus tendon and MRI of the cervical spine showed a 4.6 x 8.2 cm mass in the left para prevertebral region from C2-C7.  Mass encased the left vertebral artery.  The mass displaces the left pharynx anterior along the left carotid sheath.  Repeat biopsy was consistent with adenocarcinoma.  PET CT scan confirmed left large prevertebral space mass in the region of the cervical spine.  He also had a 1.7 cm left supraclavicular node with hypermetabolic activity.  His lung mass has completely resolved.  He has decreased weakness and strength in his left upper extremity is wearing a sling at this time and is on narcotic analgesics for pain.  He is now referred to radiation oncology for consideration of palliative treatment.  Patient has been started on Decadron.  Patient will be getting chemotherapy carboplatinum along  with [name] for progression of disease.  COMPLICATIONS OF TREATMENT: none  FOLLOW UP COMPLIANCE: keeps appointments   PHYSICAL EXAM:  BP (!) (P) 144/109 (BP Location: Left Arm, Patient Position: Sitting)   Pulse (!) (P) 131   Temp (!) (P) 94.6 F (34.8 C) (Tympanic)   Resp (P) 18   Wt (P) 168 lb 6.4 oz (76.4 kg)   BMI (P) 23.49 kg/m  Patient has a prominent mass in the left neck.  Some fullness down into the left supraclavicular fossa.  He has decreased strength in his left upper extremity.  Well-developed well-nourished patient in NAD. HEENT reveals PERLA, EOMI, discs not visualized.  Oral cavity is clear. No oral mucosal lesions are identified. Neck is clear without evidence of cervical or supraclavicular adenopathy. Lungs are clear to A&P. Cardiac examination is essentially unremarkable with regular rate and rhythm without murmur rub or thrill. Abdomen is benign with no organomegaly or masses noted. Motor sensory and DTR levels are equal and symmetric in the upper and lower extremities. Cranial nerves II through XII are grossly intact. Proprioception is intact. No peripheral adenopathy or edema is identified. No motor or sensory levels are noted. Crude visual fields are within normal range.  RADIOLOGY RESULTS: PET CT scan MRI scans reviewed compatible with above-stated findings  PLAN: At this time like to start a palliative course of radiation therapy to the left neck mass.  We will plan on delivering 3000 cGy in 10 fractions and evaluate for response.  We use 3-dimensional treatment planning a PET CT fusion study to set my  treatment fields.  Patient will be receiving concurrent chemotherapy.  There will be extra effort by both professional staff as well as technical staff to coordinate and manage concurrent chemoradiation and ensuing side effects during his treatments.  Risks and benefits of treatment including possible radiation esophagitis secondary to the mass being close to his esophagus  skin reaction fatigue alteration of blood counts all were described in detail to the patient.  I have personally set up and ordered CT simulation for tomorrow we will try to ask [employer] starting his treatments.  Patient comprehends my recommendations well.  I would like to take this opportunity to thank you for allowing me to participate in the care of your patient.Noreene Filbert, MD

## 2020-08-18 ENCOUNTER — Ambulatory Visit: Payer: 59 | Admitting: Radiation Oncology

## 2020-08-18 ENCOUNTER — Encounter: Payer: Self-pay | Admitting: *Deleted

## 2020-08-18 ENCOUNTER — Ambulatory Visit
Admission: RE | Admit: 2020-08-18 | Discharge: 2020-08-18 | Disposition: A | Discharge status: Home / Self Care | Payer: 59 | Source: Ambulatory Visit | Attending: Radiation Oncology | Admitting: Radiation Oncology

## 2020-08-18 DIAGNOSIS — Z510 Encounter for antineoplastic radiation therapy: Secondary | ICD-10-CM | POA: Insufficient documentation

## 2020-08-18 DIAGNOSIS — C3412 Malignant neoplasm of upper lobe, left bronchus or lung: Secondary | ICD-10-CM | POA: Diagnosis not present

## 2020-08-18 NOTE — Progress Notes (Signed)
  Oncology Nurse Navigator Documentation  Navigator Location: [facility] (08/18/20 1400)   )Navigator Encounter Type: Appt/Treatment Plan Review (08/18/20 1400)                     Patient Visit Type: CYOYOO (08/18/20 1400) Treatment Phase: CT SIM (08/18/20 1400)            Appt/treatment plan review. Pt scheduled to start radiation to neck mass on Thurs 8/26. Dr. Janese Banks made aware. Will follow up with pt next week to assess for any barriers or needs.                Time Spent with Patient: 30 (08/18/20 1400)

## 2020-08-19 DIAGNOSIS — C3412 Malignant neoplasm of upper lobe, left bronchus or lung: Secondary | ICD-10-CM | POA: Diagnosis not present

## 2020-08-20 ENCOUNTER — Ambulatory Visit
Admission: RE | Admit: 2020-08-20 | Discharge: 2020-08-20 | Disposition: A | Discharge status: Home / Self Care | Payer: 59 | Source: Ambulatory Visit | Attending: Radiation Oncology | Admitting: Radiation Oncology

## 2020-08-20 ENCOUNTER — Other Ambulatory Visit: Payer: 59

## 2020-08-20 DIAGNOSIS — C3412 Malignant neoplasm of upper lobe, left bronchus or lung: Secondary | ICD-10-CM | POA: Diagnosis not present

## 2020-08-21 ENCOUNTER — Other Ambulatory Visit: Payer: Self-pay

## 2020-08-21 ENCOUNTER — Ambulatory Visit
Admission: RE | Admit: 2020-08-21 | Discharge: 2020-08-21 | Disposition: A | Discharge status: Home / Self Care | Payer: 59 | Source: Ambulatory Visit | Attending: Radiation Oncology | Admitting: Radiation Oncology

## 2020-08-21 ENCOUNTER — Inpatient Hospital Stay (HOSPITAL_BASED_OUTPATIENT_CLINIC_OR_DEPARTMENT_OTHER): Payer: 59 | Admitting: Oncology

## 2020-08-21 ENCOUNTER — Other Ambulatory Visit: Payer: 59

## 2020-08-21 ENCOUNTER — Inpatient Hospital Stay: Payer: 59

## 2020-08-21 ENCOUNTER — Encounter: Payer: Self-pay | Admitting: Oncology

## 2020-08-21 VITALS — BP 133/88 | HR 104 | Temp 97.9°F | Resp 16 | Ht 71.0 in | Wt 169.2 lb

## 2020-08-21 DIAGNOSIS — G893 Neoplasm related pain (acute) (chronic): Secondary | ICD-10-CM | POA: Diagnosis not present

## 2020-08-21 DIAGNOSIS — C3412 Malignant neoplasm of upper lobe, left bronchus or lung: Secondary | ICD-10-CM | POA: Diagnosis not present

## 2020-08-21 DIAGNOSIS — R221 Localized swelling, mass and lump, neck: Secondary | ICD-10-CM | POA: Diagnosis not present

## 2020-08-21 DIAGNOSIS — Z5111 Encounter for antineoplastic chemotherapy: Secondary | ICD-10-CM

## 2020-08-21 MED ORDER — SODIUM CHLORIDE 0.9 % IV SOLN
150.0000 mg | Freq: Once | INTRAVENOUS | Status: AC
  Administered 2020-08-21: 150 mg via INTRAVENOUS
  Filled 2020-08-21: qty 5

## 2020-08-21 MED ORDER — PALONOSETRON HCL INJECTION 0.25 MG/5ML
0.2500 mg | Freq: Once | INTRAVENOUS | Status: AC
  Administered 2020-08-21: 0.25 mg via INTRAVENOUS
  Filled 2020-08-21: qty 5

## 2020-08-21 MED ORDER — SODIUM CHLORIDE 0.9 % IV SOLN
692.5000 mg | Freq: Once | INTRAVENOUS | Status: AC
  Administered 2020-08-21: 690 mg via INTRAVENOUS
  Filled 2020-08-21: qty 69

## 2020-08-21 MED ORDER — MORPHINE SULFATE (PF) 2 MG/ML IV SOLN
4.0000 mg | Freq: Once | INTRAVENOUS | Status: AC
  Administered 2020-08-21: 4 mg via INTRAVENOUS
  Filled 2020-08-21: qty 2

## 2020-08-21 MED ORDER — HEPARIN SOD (PORK) LOCK FLUSH 100 UNIT/ML IV SOLN
500.0000 [IU] | Freq: Once | INTRAVENOUS | Status: AC | PRN
  Administered 2020-08-21: 500 [IU]
  Filled 2020-08-21: qty 5

## 2020-08-21 MED ORDER — HEPARIN SOD (PORK) LOCK FLUSH 100 UNIT/ML IV SOLN
INTRAVENOUS | Status: AC
  Filled 2020-08-21: qty 5

## 2020-08-21 MED ORDER — SODIUM CHLORIDE 0.9 % IV SOLN
500.0000 mg/m2 | Freq: Once | INTRAVENOUS | Status: AC
  Administered 2020-08-21: 1000 mg via INTRAVENOUS
  Filled 2020-08-21: qty 40

## 2020-08-21 MED ORDER — SODIUM CHLORIDE 0.9 % IV SOLN
10.0000 mg | Freq: Once | INTRAVENOUS | Status: AC
  Administered 2020-08-21: 10 mg via INTRAVENOUS
  Filled 2020-08-21: qty 10

## 2020-08-21 MED ORDER — SODIUM CHLORIDE 0.9 % IV SOLN
Freq: Once | INTRAVENOUS | Status: AC
  Filled 2020-08-21: qty 250

## 2020-08-21 MED ORDER — CYANOCOBALAMIN 1000 MCG/ML IJ SOLN
1000.0000 ug | Freq: Once | INTRAMUSCULAR | Status: AC
  Administered 2020-08-21: 1000 ug via INTRAMUSCULAR
  Filled 2020-08-21: qty 1

## 2020-08-21 NOTE — Progress Notes (Addendum)
Tumor Board Documentation  Kyle Guerrero was presented by Dr Janese Banks at our Tumor Board on 08/20/2020, which included representatives from medical oncology, radiation oncology, surgical oncology, internal medicine, navigation, pathology, radiology, surgical, pulmonology, research.  Kyle Guerrero currently presents as a current patient, for [place], for new positive pathology with history of the following treatments: active survellience, neoadjuvant chemoradiation.  Additionally, we reviewed previous medical and familial history, history of present illness, and recent lab results along with all available histopathologic and imaging studies. The tumor board considered available treatment options and made the following recommendations: Palliative radiation to neck mass.  2nd line palliative chemo carbo/alimta Additional screening (NGS Testing on Pathology then decide if Treatment needs to be tailored)    The following procedures/referrals were also placed: No orders of the defined types were placed in this encounter.   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  Tumor board is a meeting of clinicians from various specialty areas who evaluate and discuss patients for whom a multidisciplinary approach is being considered. Final determinations in the plan of care are those of the provider(s). The responsibility for follow up of recommendations given during tumor board is that of the provider.   Todays extended care, comprehensive team conference, Kyle Guerrero was not present for the discussion and was not examined.   Multidisciplinary Tumor Board is a multidisciplinary case peer review process.  Decisions discussed in the Multidisciplinary Tumor Board reflect the opinions of  the specialists present at the conference without having examined the patient.  Ultimately, treatment and diagnostic decisions rest with the primary provider(s) and the patient.

## 2020-08-21 NOTE — Progress Notes (Signed)
Per Judeen Hammans RN per Dr. Janese Banks proceed with [name]/Carboplatin treatment with labs from 08/14/20 and HR of 104  1125: Pt tolerated infusion well. No s/s of distress or reaction noted. Pt stable at discharge.

## 2020-08-21 NOTE — Progress Notes (Addendum)
Hematology/Oncology Consult note [facility]  [phone] [phone]  Patient Care Team: Default, Provider, MD as PCP - General Telford Nab, RN as Registered Nurse Sindy Guadeloupe, MD as Consulting Physician (Hematology and Oncology) Sindy Guadeloupe, MD as Consulting Physician (Hematology and Oncology)   Name of the patient: Kyle Guerrero  [id number]  [birth year]   Date of visit: 08/21/20  Diagnosis- Non-small cell lung cancer stage IV acT2 cN2 cM1 a with pleural involvement  Chief complaint/ Reason for visit-on treatment assessment prior to cycle 1 of carboplatin and [name] chemotherapy  Heme/Onc history:  patient is a 49 year old male who presented to the ER with symptoms ofheaviness in his chest and upper left chest discomfort he underwent CT angio chest to rule out PE which showed 3.8 x 3.3 cm left upper palpable lung mass along with 4.4 x 3.3 cm lobulated mass in the aortopulmonary window and 2.6 x 2.4 cm left hilar mass all concerning for malignancy. Patient has also seen pulmonary and has been set up for bronchoscopy and EBUS guided biopsy on 11/23/2019. Patient underwent. PET CT scan which showed a hypermetabolic spiculated 3.5 cm of 5 left upper lobe lung mass, adjacent hypermetabolic 3.2 x 1.2 cm pleural metastases in the medial posterior by the left pleural space along with scalloping of the adjacent posterior left third rib. Hypermetabolic infiltrative left perihilar conglomerate nodal metastases measuring up to 7.3 x 3.6 cm and 0.8 cm high left mediastinal node between the left brachiocephalic vein and left subclavian artery. No evidence of distant metastatic disease  Biopsy showed non-small cell lung cancer but further characterization could not be determined. Insufficient tissue for NGS testing. Repeat biopsy done. Results of NGStestingshowed PD-L1 50%. Tumor mutational burden high. 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  Patient completed concurrent chemoradiation with carbotaxol chemotherapy followed by 2 cycles of carbotaxol Keytruda and was on maintenance Keytruda  Patient was complaining of left shoulder pain and underwent MRI of the shoulder which showed 8 in the supraspinatus tendon.  He was subsequently seen by [facility] and underwent MRI of the cervical spine which showed a 4.6 x 4.8 x 8.2 cm mass in the left prevertebral region from C2-C7 levels.  The mass partially encases the left vertebral artery and abuts the preforaminal segment.  Invades the vertebral bodies and edema of the left C5 articular pillar.  There is slight extension into the left C4-C5 thecal sac without central thecal sac compression.  The mass displaces the left pharynx anteriorly along the left carotid sheath.  Patient had a repeat biopsy which was consistent with adenocarcinoma. Patient is undergoing palliative radiation treatment and plan is to proceed with carboplatin and [name] while awaiting repeat NGS testing  Interval history- .  Reports pain medicine is not helping him control his pain.  He is able to make a fist but unable to have any movements at the shoulder joint.  He is unable to have any movements in his left arm or forearm.  Reports he is having regular bowel movements  ECOG PS- 1 Pain scale- 6 Opioid associated constipation- no  Review of systems- Review of Systems  Constitutional: Negative for chills, fever, malaise/fatigue and weight loss.  HENT: Negative for congestion, ear discharge and nosebleeds.        Neck swelling  Eyes: Negative for blurred vision.  Respiratory: Negative for cough, hemoptysis, sputum production, shortness of breath and wheezing.   Cardiovascular: Negative for  chest pain, palpitations, orthopnea and claudication.  Gastrointestinal: Negative for abdominal pain, blood in stool, constipation, diarrhea, heartburn, melena, nausea and vomiting.   Genitourinary: Negative for dysuria, flank pain, frequency, hematuria and urgency.  Musculoskeletal: Negative for back pain, joint pain and myalgias.       Left upper extremity pain and weakness  Skin: Negative for rash.  Neurological: Negative for dizziness, tingling, focal weakness, seizures, weakness and headaches.  Endo/Heme/Allergies: Does not bruise/bleed easily.  Psychiatric/Behavioral: Negative for depression and suicidal ideas. The patient does not have insomnia.       No Known Allergies   Past Medical History:  Diagnosis Date  . Asthma   . Lung cancer [facility])      Past Surgical History:  Procedure Laterality Date  . CYST EXCISION    . IR IMAGING GUIDED PORT INSERTION  12/05/2019  . VIDEO BRONCHOSCOPY WITH ENDOBRONCHIAL ULTRASOUND Left 11/22/2019   Procedure: VIDEO BRONCHOSCOPY WITH ENDOBRONCHIAL ULTRASOUND;  Surgeon: Tyler Pita, MD;  Location: [facility] ORS;  Service: Thoracic;  Laterality: Left;    Social History   Socioeconomic History  . Marital status: Single    Spouse name: Not on file  . Number of children: Not on file  . Years of education: Not on file  . Highest education level: Not on file  Occupational History  . Not on file  Tobacco Use  . Smoking status: Former Smoker    Packs/day: 2.00    Types: Cigarettes    Quit date: 11/08/2019    Years since quitting: 0.7  . Smokeless tobacco: Never Used  Vaping Use  . Vaping Use: Never used  Substance and Sexual Activity  . Alcohol use: Not Currently  . Drug use: Yes    Types: Marijuana  . Sexual activity: Not on file  Other Topics Concern  . Not on file  Social History Narrative  . Not on file   Social Determinants of Health   Financial Resource Strain:   . Difficulty of Paying Living Expenses: Not on file  Food Insecurity:   . Worried About Charity fundraiser in the Last Year: Not on file  . Ran Out of Food in the Last Year: Not on file  Transportation Needs:   . Lack of Transportation  (Medical): Not on file  . Lack of Transportation (Non-Medical): Not on file  Physical Activity:   . Days of Exercise per Week: Not on file  . Minutes of Exercise per Session: Not on file  Stress:   . Feeling of Stress : Not on file  Social Connections:   . Frequency of Communication with Friends and Family: Not on file  . Frequency of Social Gatherings with Friends and Family: Not on file  . Attends Religious Services: Not on file  . Active Member of Clubs or Organizations: Not on file  . Attends Archivist Meetings: Not on file  . Marital Status: Not on file  Intimate Partner Violence:   . Fear of Current or Ex-Partner: Not on file  . Emotionally Abused: Not on file  . Physically Abused: Not on file  . Sexually Abused: Not on file    Family History  Problem Relation Age of Onset  . Healthy Mother   . Healthy Father      Current Outpatient Medications:  .  acetaminophen (TYLENOL) 325 MG tablet, Take 650 mg by mouth every 6 (six) hours as needed., Disp: , Rfl:  .  azelastine (ASTELIN) 0.1 % nasal spray, Place  1 spray into the nose as needed. , Disp: , Rfl:  .  cyclobenzaprine (FLEXERIL) 5 MG tablet, Take 5 mg by mouth 3 (three) times daily as needed. , Disp: , Rfl:  .  dexamethasone (DECADRON) 4 MG tablet, Take 1 tablet (4 mg total) by mouth 2 (two) times daily with a meal., Disp: 60 tablet, Rfl: 0 .  folic acid (FOLVITE) 1 MG tablet, Take 1 tablet (1 mg total) by mouth daily. Start 5-7 days before [name] chemotherapy. Continue until 21 days after [name] completed., Disp: 100 tablet, Rfl: 3 .  lidocaine-prilocaine (EMLA) cream, Apply to affected area once, Disp: 30 g, Rfl: 3 .  lidocaine-prilocaine (EMLA) cream, Apply to affected area once, Disp: 30 g, Rfl: 3 .  ondansetron (ZOFRAN) 8 MG tablet, Take 1 tablet (8 mg total) by mouth 2 (two) times daily as needed (Nausea or vomiting). Start if needed on the third day after chemotherapy., Disp: 30 tablet, Rfl: 1 .  oxyCODONE  (OXY IR/ROXICODONE) 5 MG immediate release tablet, Take 1 tablet (5 mg total) by mouth every 4 (four) hours as needed for moderate pain or severe pain., Disp: 180 tablet, Rfl: 0 .  prochlorperazine (COMPAZINE) 10 MG tablet, Take 1 tablet (10 mg total) by mouth every 6 (six) hours as needed (Nausea or vomiting)., Disp: 30 tablet, Rfl: 1 .  sucralfate (CARAFATE) 1 g tablet, Take 1 tablet (1 g total) by mouth 3 (three) times daily before meals. Dissolve in warm water, swish and swallow, Disp: 90 tablet, Rfl: 6 .  traMADol (ULTRAM) 50 MG tablet, Take 50 mg by mouth every 6 (six) hours as needed. , Disp: , Rfl:  No current facility-administered medications for this visit.  Facility-Administered Medications Ordered in Other Visits:  .  sodium chloride flush (NS) 0.9 % injection 10 mL, 10 mL, Intravenous, PRN, Sindy Guadeloupe, MD, 10 mL at 05/01/20 0900  Physical exam:  Vitals:   08/21/20 0905  BP: 133/88  Pulse: (!) 104  Resp: 16  Temp: 97.9 F (36.6 C)  TempSrc: Oral  Weight: 168 lb (76.2 kg)  Height: 5' 11"  (1.803 m)   Physical Exam Cardiovascular:     Rate and Rhythm: Regular rhythm. Tachycardia present.     Heart sounds: Normal heart sounds.  Pulmonary:     Effort: Pulmonary effort is normal.     Breath sounds: Normal breath sounds.  Abdominal:     General: Bowel sounds are normal.     Palpations: Abdomen is soft.  Skin:    General: Skin is warm and dry.  Neurological:     Mental Status: He is alert and oriented to person, place, and time.     Comments: See below.    musculoskeletal:  no power in the left upper extremity. Power 4/5 in the left hand muscles.   CMP Latest Ref Rng & Units 08/14/2020  Glucose 70 - 99 mg/dL 108(H)  BUN 6 - 20 mg/dL 14  Creatinine 0.61 - 1.24 mg/dL 0.89  Sodium 135 - 145 mmol/L 135  Potassium 3.5 - 5.1 mmol/L 3.6  Chloride 98 - 111 mmol/L 100  CO2 22 - 32 mmol/L 25  Calcium 8.9 - 10.3 mg/dL 9.0  Total Protein 6.5 - 8.1 g/dL 8.1  Total Bilirubin  0.3 - 1.2 mg/dL 0.5  Alkaline Phos 38 - 126 U/L 126  AST 15 - 41 U/L 33  ALT 0 - 44 U/L 31   CBC Latest Ref Rng & Units 08/14/2020  WBC 4.0 -  10.5 K/uL 6.9  Hemoglobin 13.0 - 17.0 g/dL 11.9(L)  Hematocrit 39 - 52 % 35.2(L)  Platelets 150 - 400 K/uL 434(H)    No images are attached to the encounter.  NM PET Image Restag (PS) Skull Base To Thigh  Result Date: 08/13/2020 CLINICAL DATA:  Subsequent treatment strategy for lung cancer. Also initial workup of neck mass. EXAM: NUCLEAR MEDICINE PET SKULL BASE TO THIGH TECHNIQUE: 8.9 mCi F-18 FDG was injected intravenously. Full-ring PET imaging was performed from the skull base to thigh after the radiotracer. CT data was obtained and used for attenuation correction and anatomic localization. Fasting blood glucose: 96 mg/dl COMPARISON:  Multiple exams, including cervical MRI from 07/27/2020 and CT examination from 06/11/2020 FINDINGS: Mediastinal blood pool activity: SUV max 2.3 Liver activity: SUV max N/A NECK: Large left neck mass primarily in the prevertebral space but displacing adjacent spaces observed with apparent erosion into the C3, C4, C5, C6, and possibly C7 vertebral bodies, and with known extension into the left neural foramina at C4-5, C5-6, and possibly C6-7. This mass has a maximum SUV of 20.3, and associated hypermetabolic activity measures approximately 6.8 by 6.1 by 9.3 cm. In the areas of mild T2 central cervical spinal cord signal abnormality most notably at the T6-7 level on the outside MRI from 07/27/2020, I not discern definite abnormal metabolic activity within the cord, although admittedly the signal abnormality the small. Incidental CT findings: none CHEST: Left supraclavicular node 1.7 cm in short axis on image 65/4, maximum SUV 16.7, compatible with malignancy. Bandlike 1.5 by 1.1 by 3.3 cm left upper lobe nodule appears essentially stable and has a hypermetabolic activity of only 1.4. There is some new indistinct airspace opacity  posteriorly in the left upper lobe along the major fissure. This is not hypermetabolic and is likely inflammatory. Reduced conspicuity of the previously 4 mm nodule in the left upper lobe, now barely apparent and perhaps 2 mm in diameter. Incidental CT findings: Small band of chronic scarring in the superior segment left lower lobe, unchanged. Airway thickening is most striking in the left upper lobe. Right Port-A-Cath tip: Cavoatrial junction. ABDOMEN/PELVIS: Accentuated activity in the cecum without CT correlate, likely physiologic. Activity in this vicinity measures up to 13.0. Incidental CT findings: none SKELETON: Small lytic lesion of the left anterior third rib measuring about 0.8 by 0.7 cm with surrounding sclerosis and low-grade metabolic activity, maximum SUV 3.7. On prior exams such 11/18/2019 there is pleural-based mass immediately adjacent to this rib, and I suspect that the current erosion and activity reflects a small amount of malignant invasion of this rib. However, the previous pleural thickening has resolved. Incidental CT findings: none IMPRESSION: 1. Large left prevertebral space mass as shown on recent outside cervical spine MRI from [facility] of 08/05/2020, maximum SUV 20.3, compatible with active malignancy and invading multiple vertebral bodies and neural foramina. 2. 1.7 cm in short axis left supraclavicular node with maximum SUV 16.7, compatible with active malignancy. 3. The persistent left upper lobe bandlike nodule in the apical segment has a maximum SUV of only 1.4, indicating response to treatment. Although the adjacent pleural thickening has substantially reduced over the last 10 months, there is a small lytic erosion of the left anterior third rib with adjacent sclerosis and low-grade metabolic activity likely representing residuum from malignant invasion of the rib. 4. Minimal indistinct airspace opacity posteriorly in the left upper lobe along the major  fissure is not hypermetabolic and is likely inflammatory. There is  some airway thickening most striking in the left upper lobe, possibly treatment related. 5. Focally accentuated activity in the cecum, but without CT correlate hence likely physiologic. Electronically Signed   By: Van Clines M.D.   On: 08/13/2020 09:21   [place] CORE BIOPSY (SOFT TISSUE)  Result Date: 08/11/2020 CLINICAL DATA:  History of treated lung carcinoma with large left neck mass most likely representing recurrent metastatic disease. EXAM: ULTRASOUND GUIDED CORE BIOPSY OF LEFT NECK MASS MEDICATIONS: 2.0 mg IV Versed; 100 mcg IV Fentanyl Total Moderate Sedation Time: 11 minutes. The patient's level of consciousness and physiologic status were continuously monitored during the procedure by Radiology nursing. PROCEDURE: The procedure, risks, benefits, and alternatives were explained to the patient. Questions regarding the procedure were encouraged and answered. The patient understands and consents to the procedure. A time out was performed prior to initiating the procedure. Ultrasound was performed of the left neck. The left neck was prepped with chlorhexidine in a sterile fashion, and a sterile drape was applied covering the operative field. A sterile gown and sterile gloves were used for the procedure. Local anesthesia was provided with 1% Lidocaine. Under ultrasound guidance, 18 gauge core biopsy samples were obtained of a left neck mass. Three separate core biopsy samples were obtained and submitted in formalin. Additional ultrasound was performed. COMPLICATIONS: None. FINDINGS: Large solid mass in the left neck occupies much of the lateral and posterolateral neck and measures greater than 7 cm in maximal diameter by ultrasound. Solid tissue was obtained. IMPRESSION: Ultrasound-guided core biopsy performed of a large solid mass in the left neck. Electronically Signed   By: Aletta Edouard M.D.   On: 08/11/2020 12:43   DG Outside  Films Head/Face  Result Date: 08/05/2020 This examination belongs to an outside facility and is stored here for comparison purposes only.  Contact the originating outside institution for any associated report or interpretation.  DG Outside Films Head/Face  Result Date: 08/05/2020 This examination belongs to an outside facility and is stored here for comparison purposes only.  Contact the originating outside institution for any associated report or interpretation.  DG Outside Films Extremity  Result Date: 08/05/2020 This examination belongs to an outside facility and is stored here for comparison purposes only.  Contact the originating outside institution for any associated report or interpretation.  DG Outside Films Extremity  Result Date: 08/05/2020 This examination belongs to an outside facility and is stored here for comparison purposes only.  Contact the originating outside institution for any associated report or interpretation.    Assessment and plan- Patient is a 50 y.o. male with stage IV adenocarcinoma of the lung with pleural metastases.He is here for on treatment assessment prior to cycle 1 of carboplatin [name] chemotherapy  1.  Neoplasm related pain: Patient is undergoing palliative radiation treatment for his neck mass that is causing neck swelling and left upper extremity pain and weakness.  He will be receiving that for 10 fractions.  Patient is getting as needed oxycodone and Decadron for pain control as well.  Continue Decadron increase oxycodone to 10 mg every 4 hours as needed.  There is also concern for cervical instability once he completes radiation treatment.  I will prescribe a soft collar for his neck.  We will consider physical therapy as well upon completion of radiation treatment  2.  From systemic therapy standpoint I will plan to proceed with carboplatin AUC 5 and [name] 500 mg per metered squared today.  He will also receive B12 shot  today and will be taking  folic acid 1 mg daily.  He will not receive on for Neulasta as he is receiving concurrent radiation treatment.  Discussed risks and benefits of chemotherapy including all but not limited to nausea, vomiting, low blood counts, risk of infections and hospitalizations.  Patient understands and agrees to proceed as planned.  I will see him back in 1 week's time to assess his ongoing pain as well as tolerance to treatment.  3.  Currently awaiting NGS testing on his repeat tumor specimen to decide if his treatment needs to be tailored moving forward     Visit Diagnosis 1. Encounter for antineoplastic chemotherapy   2. Malignant neoplasm of upper lobe of left lung ([place])   3. Neck mass      Dr. Randa Evens, MD, MPH [facility] [phone] 08/21/2020 8:40 AM

## 2020-08-21 NOTE — Progress Notes (Signed)
Pt is dealing with the neck pain and left arm pain from tumor. Has oxycodone and it helps but he feels like his arm "dead arm"

## 2020-08-24 ENCOUNTER — Ambulatory Visit
Admission: RE | Admit: 2020-08-24 | Discharge: 2020-08-24 | Disposition: A | Discharge status: Home / Self Care | Payer: 59 | Source: Ambulatory Visit | Attending: Radiation Oncology | Admitting: Radiation Oncology

## 2020-08-24 DIAGNOSIS — C3412 Malignant neoplasm of upper lobe, left bronchus or lung: Secondary | ICD-10-CM | POA: Diagnosis not present

## 2020-08-25 ENCOUNTER — Telehealth: Payer: Self-pay | Admitting: *Deleted

## 2020-08-25 ENCOUNTER — Ambulatory Visit: Payer: 59

## 2020-08-25 NOTE — Telephone Encounter (Signed)
Spoke with patient and he said that his stomach is still cramping but did not want to see a provider today. Pt stated that he will give me a call back tomorrow morning to let me know how he is feeling. Encouraged oral intake to avoid dehydration and advised to take pain meds with small amount food. Pt verbalized understanding.

## 2020-08-25 NOTE — Telephone Encounter (Signed)
Hayley- can you please follow up?

## 2020-08-25 NOTE — Telephone Encounter (Signed)
No problem, I will call him this afternoon to follow up

## 2020-08-25 NOTE — Telephone Encounter (Signed)
Patient called stating that he was not coming to his radiation therapy appointment today due to "not feeling right, my stomach is messed up" "I am weak and tired"He denies fever, but does report that he has alternating hot and cold sweats and he does not have a thermometer. He also reports that he was a little lightheaded earlier, but not currently. Denies diarrhea or constipation. He reports that he has been taking fiber tabs since starting the Oxycodone and "they really cleaned me out yesterday".Deidre in radiation therapy informed of patient canceling appointment. Patient refused appointment with [facility] and said maybe he will call later today if he needs to for appointment.

## 2020-08-26 ENCOUNTER — Ambulatory Visit
Admission: RE | Admit: 2020-08-26 | Discharge: 2020-08-26 | Disposition: A | Discharge status: Home / Self Care | Payer: 59 | Source: Ambulatory Visit | Attending: Radiation Oncology | Admitting: Radiation Oncology

## 2020-08-26 DIAGNOSIS — Z510 Encounter for antineoplastic radiation therapy: Secondary | ICD-10-CM | POA: Insufficient documentation

## 2020-08-26 DIAGNOSIS — C3412 Malignant neoplasm of upper lobe, left bronchus or lung: Secondary | ICD-10-CM | POA: Insufficient documentation

## 2020-08-26 NOTE — Telephone Encounter (Signed)
Spoke with patient after radiation treatment today. States that he is feel much better today with no complaints. Instructed pt to call or let us know if he needs anything else. Pt verbalized understanding.

## 2020-08-27 ENCOUNTER — Ambulatory Visit
Admission: RE | Admit: 2020-08-27 | Discharge: 2020-08-27 | Disposition: A | Discharge status: Home / Self Care | Payer: 59 | Source: Ambulatory Visit | Attending: Radiation Oncology | Admitting: Radiation Oncology

## 2020-08-27 DIAGNOSIS — C3412 Malignant neoplasm of upper lobe, left bronchus or lung: Secondary | ICD-10-CM | POA: Diagnosis not present

## 2020-08-28 ENCOUNTER — Encounter: Payer: Self-pay | Admitting: Oncology

## 2020-08-28 ENCOUNTER — Ambulatory Visit
Admission: RE | Admit: 2020-08-28 | Discharge: 2020-08-28 | Disposition: A | Discharge status: Home / Self Care | Payer: 59 | Source: Ambulatory Visit | Attending: Radiation Oncology | Admitting: Radiation Oncology

## 2020-08-28 ENCOUNTER — Other Ambulatory Visit: Payer: Self-pay

## 2020-08-28 ENCOUNTER — Inpatient Hospital Stay: Payer: 59 | Attending: Oncology | Admitting: Oncology

## 2020-08-28 VITALS — BP 121/79 | HR 111 | Temp 97.2°F | Ht 71.0 in | Wt 167.7 lb

## 2020-08-28 DIAGNOSIS — Z5111 Encounter for antineoplastic chemotherapy: Secondary | ICD-10-CM | POA: Diagnosis not present

## 2020-08-28 DIAGNOSIS — C3412 Malignant neoplasm of upper lobe, left bronchus or lung: Secondary | ICD-10-CM | POA: Diagnosis not present

## 2020-08-28 DIAGNOSIS — Z923 Personal history of irradiation: Secondary | ICD-10-CM | POA: Insufficient documentation

## 2020-08-28 DIAGNOSIS — J45909 Unspecified asthma, uncomplicated: Secondary | ICD-10-CM | POA: Insufficient documentation

## 2020-08-28 DIAGNOSIS — Z5189 Encounter for other specified aftercare: Secondary | ICD-10-CM | POA: Insufficient documentation

## 2020-08-28 DIAGNOSIS — C7802 Secondary malignant neoplasm of left lung: Secondary | ICD-10-CM | POA: Diagnosis not present

## 2020-08-28 DIAGNOSIS — R531 Weakness: Secondary | ICD-10-CM | POA: Insufficient documentation

## 2020-08-28 DIAGNOSIS — M542 Cervicalgia: Secondary | ICD-10-CM | POA: Diagnosis not present

## 2020-08-28 DIAGNOSIS — R29898 Other symptoms and signs involving the musculoskeletal system: Secondary | ICD-10-CM

## 2020-08-28 DIAGNOSIS — C782 Secondary malignant neoplasm of pleura: Secondary | ICD-10-CM | POA: Insufficient documentation

## 2020-08-28 DIAGNOSIS — M489 Spondylopathy, unspecified: Secondary | ICD-10-CM | POA: Diagnosis not present

## 2020-08-28 DIAGNOSIS — R222 Localized swelling, mass and lump, trunk: Secondary | ICD-10-CM | POA: Diagnosis not present

## 2020-08-28 DIAGNOSIS — Z87891 Personal history of nicotine dependence: Secondary | ICD-10-CM | POA: Diagnosis not present

## 2020-08-28 NOTE — Progress Notes (Signed)
Hematology/Oncology Consult note [facility]  Telephone:([phone] [phone]  Patient Care Team: Default, Provider, MD as PCP - General Telford Nab, RN as Registered Nurse Sindy Guadeloupe, MD as Consulting Physician (Hematology and Oncology) Sindy Guadeloupe, MD as Consulting Physician (Hematology and Oncology)   Name of the patient: Kyle Guerrero  [id number]  [birth year]   Date of visit: 08/28/20  Diagnosis- Non-small cell lung cancer stage IV acT2 cN2 cM1 a with pleural involvement  Chief complaint/ Reason for visit-assess ongoing neck pain and left upper extremity weakness  Heme/Onc history: patient is a 49 year old male who presented to the ER with symptoms ofheaviness in his chest and upper left chest discomfort he underwent CT angio chest to rule out PE which showed 3.8 x 3.3 cm left upper palpable lung mass along with 4.4 x 3.3 cm lobulated mass in the aortopulmonary window and 2.6 x 2.4 cm left hilar mass all concerning for malignancy. Patient has also seen pulmonary and has been set up for bronchoscopy and EBUS guided biopsy on 11/23/2019. Patient underwent. PET CT scan which showed a hypermetabolic spiculated 3.5 cm of 5 left upper lobe lung mass, adjacent hypermetabolic 3.2 x 1.2 cm pleural metastases in the medial posterior by the left pleural space along with scalloping of the adjacent posterior left third rib. Hypermetabolic infiltrative left perihilar conglomerate nodal metastases measuring up to 7.3 x 3.6 cm and 0.8 cm high left mediastinal node between the left brachiocephalic vein and left subclavian artery. No evidence of distant metastatic disease  Biopsy showed non-small cell lung cancer but further characterization could not be determined. Insufficient tissue for NGS testing. Repeat biopsy done. Results of NGStestingshowed PD-L1 50%. Tumor mutational burden high. ERBB2 copy number again.NGS testing on peripheral blood  showed NTRK mutation.  Patient completed concurrent chemoradiation with carbotaxol chemotherapy followed by 2 cycles of carbotaxol Keytruda and was on maintenance Keytruda  Patient was complaining of left shoulder pain and underwent MRI of the shoulder which showed8 in the supraspinatus tendon. He was subsequently seen by [facility] and underwent MRI of the cervical spine which showed a 4.6 x 4.8 x 8.2 cm mass in the left prevertebral region from C2-C7 levels. The mass partially encases the left vertebral artery and abuts the preforaminal segment. Invades the vertebral bodies and edema of the left C5 articular pillar. There is slight extension into the left C4-C5 thecal sac without central thecal sac compression. The mass displaces the left pharynx anteriorly along the left carotid sheath.Patient had a repeat biopsy which was consistent with adenocarcinoma. Patient is undergoing palliative radiation treatment and plan is to proceed with carboplatin and [name] while awaiting repeat NGS testing   Interval history-neck swelling has gone down however no recovery of left arm function yet.  Reports having some diarrhea for a day which ultimately resolved.  He takes his Decadron but does not take his oxycodone as much.  He is looking into long-term disability  ECOG PS- 1 Pain scale- 5 Opioid associated constipation- no  Review of systems- Review of Systems  Constitutional: Positive for malaise/fatigue. Negative for chills, fever and weight loss.  HENT: Negative for congestion, ear discharge and nosebleeds.   Eyes: Negative for blurred vision.  Respiratory: Negative for cough, hemoptysis, sputum production, shortness of breath and wheezing.   Cardiovascular: Negative for chest pain, palpitations, orthopnea and claudication.  Gastrointestinal: Negative for abdominal pain, blood in stool, constipation, diarrhea, heartburn, melena, nausea and vomiting.  Genitourinary: Negative for  dysuria,  flank pain, frequency, hematuria and urgency.  Musculoskeletal: Negative for back pain, joint pain and myalgias.  Skin: Negative for rash.  Neurological: Negative for dizziness, tingling, focal weakness, seizures, weakness and headaches.       Left arm weakness  Endo/Heme/Allergies: Does not bruise/bleed easily.  Psychiatric/Behavioral: Negative for depression and suicidal ideas. The patient does not have insomnia.       No Known Allergies   Past Medical History:  Diagnosis Date  . Asthma   . Lung cancer [facility])      Past Surgical History:  Procedure Laterality Date  . CYST EXCISION    . IR IMAGING GUIDED PORT INSERTION  12/05/2019  . VIDEO BRONCHOSCOPY WITH ENDOBRONCHIAL ULTRASOUND Left 11/22/2019   Procedure: VIDEO BRONCHOSCOPY WITH ENDOBRONCHIAL ULTRASOUND;  Surgeon: Tyler Pita, MD;  Location: [facility] ORS;  Service: Thoracic;  Laterality: Left;    Social History   Socioeconomic History  . Marital status: Single    Spouse name: Not on file  . Number of children: Not on file  . Years of education: Not on file  . Highest education level: Not on file  Occupational History  . Not on file  Tobacco Use  . Smoking status: Former Smoker    Packs/day: 2.00    Types: Cigarettes    Quit date: 11/08/2019    Years since quitting: 0.8  . Smokeless tobacco: Never Used  Vaping Use  . Vaping Use: Never used  Substance and Sexual Activity  . Alcohol use: Not Currently  . Drug use: Yes    Types: Marijuana  . Sexual activity: Not on file  Other Topics Concern  . Not on file  Social History Narrative  . Not on file   Social Determinants of Health   Financial Resource Strain:   . Difficulty of Paying Living Expenses: Not on file  Food Insecurity:   . Worried About Charity fundraiser in the Last Year: Not on file  . Ran Out of Food in the Last Year: Not on file  Transportation Needs:   . Lack of Transportation (Medical): Not on file  . Lack of Transportation  (Non-Medical): Not on file  Physical Activity:   . Days of Exercise per Week: Not on file  . Minutes of Exercise per Session: Not on file  Stress:   . Feeling of Stress : Not on file  Social Connections:   . Frequency of Communication with Friends and Family: Not on file  . Frequency of Social Gatherings with Friends and Family: Not on file  . Attends Religious Services: Not on file  . Active Member of Clubs or Organizations: Not on file  . Attends Archivist Meetings: Not on file  . Marital Status: Not on file  Intimate Partner Violence:   . Fear of Current or Ex-Partner: Not on file  . Emotionally Abused: Not on file  . Physically Abused: Not on file  . Sexually Abused: Not on file    Family History  Problem Relation Age of Onset  . Healthy Mother   . Healthy Father      Current Outpatient Medications:  .  acetaminophen (TYLENOL) 325 MG tablet, Take 650 mg by mouth every 6 (six) hours as needed., Disp: , Rfl:  .  azelastine (ASTELIN) 0.1 % nasal spray, Place 1 spray into the nose as needed. , Disp: , Rfl:  .  dexamethasone (DECADRON) 4 MG tablet, Take 1 tablet (4 mg total) by mouth 2 (two)  times daily with a meal. (Patient taking differently: Take 4 mg by mouth 3 (three) times daily. ), Disp: 60 tablet, Rfl: 0 .  folic acid (FOLVITE) 1 MG tablet, Take 1 tablet (1 mg total) by mouth daily. Start 5-7 days before [name] chemotherapy. Continue until 21 days after [name] completed., Disp: 100 tablet, Rfl: 3 .  lidocaine-prilocaine (EMLA) cream, Apply to affected area once, Disp: 30 g, Rfl: 3 .  ondansetron (ZOFRAN) 8 MG tablet, Take 1 tablet (8 mg total) by mouth 2 (two) times daily as needed (Nausea or vomiting). Start if needed on the third day after chemotherapy. (Patient not taking: Reported on 08/21/2020), Disp: 30 tablet, Rfl: 1 .  oxyCODONE (OXY IR/ROXICODONE) 5 MG immediate release tablet, Take 1 tablet (5 mg total) by mouth every 4 (four) hours as needed for moderate  pain or severe pain., Disp: 180 tablet, Rfl: 0 .  prochlorperazine (COMPAZINE) 10 MG tablet, Take 1 tablet (10 mg total) by mouth every 6 (six) hours as needed (Nausea or vomiting). (Patient not taking: Reported on 08/21/2020), Disp: 30 tablet, Rfl: 1 .  sucralfate (CARAFATE) 1 g tablet, Take 1 tablet (1 g total) by mouth 3 (three) times daily before meals. Dissolve in warm water, swish and swallow, Disp: 90 tablet, Rfl: 6 No current facility-administered medications for this visit.  Facility-Administered Medications Ordered in Other Visits:  .  sodium chloride flush (NS) 0.9 % injection 10 mL, 10 mL, Intravenous, PRN, Sindy Guadeloupe, MD, 10 mL at 05/01/20 0900  Physical exam:  Vitals:   08/28/20 1025  BP: 121/79  Pulse: (!) 111  Temp: (!) 97.2 F (36.2 C)  TempSrc: Tympanic  SpO2: 100%  Weight: 167 lb 11.2 oz (76.1 kg)  Height: 5' 11"  (1.803 m)   Physical Exam Cardiovascular:     Rate and Rhythm: Normal rate and regular rhythm.     Heart sounds: Normal heart sounds.  Pulmonary:     Effort: Pulmonary effort is normal.     Breath sounds: Normal breath sounds.  Abdominal:     General: Bowel sounds are normal.     Palpations: Abdomen is soft.  Musculoskeletal:     Comments: Left arm is in a sling.  He is able to use his hand to grip onto objects but unable to overhead abduct his arm.  No significant use of forearm  Skin:    General: Skin is warm and dry.  Neurological:     Mental Status: He is alert and oriented to person, place, and time.      CMP Latest Ref Rng & Units 08/14/2020  Glucose 70 - 99 mg/dL 108(H)  BUN 6 - 20 mg/dL 14  Creatinine 0.61 - 1.24 mg/dL 0.89  Sodium 135 - 145 mmol/L 135  Potassium 3.5 - 5.1 mmol/L 3.6  Chloride 98 - 111 mmol/L 100  CO2 22 - 32 mmol/L 25  Calcium 8.9 - 10.3 mg/dL 9.0  Total Protein 6.5 - 8.1 g/dL 8.1  Total Bilirubin 0.3 - 1.2 mg/dL 0.5  Alkaline Phos 38 - 126 U/L 126  AST 15 - 41 U/L 33  ALT 0 - 44 U/L 31   CBC Latest Ref Rng  & Units 08/14/2020  WBC 4.0 - 10.5 K/uL 6.9  Hemoglobin 13.0 - 17.0 g/dL 11.9(L)  Hematocrit 39 - 52 % 35.2(L)  Platelets 150 - 400 K/uL 434(H)    No images are attached to the encounter.  NM PET Image Restag (PS) Skull Base To Thigh  Result Date: 08/13/2020 CLINICAL DATA:  Subsequent treatment strategy for lung cancer. Also initial workup of neck mass. EXAM: NUCLEAR MEDICINE PET SKULL BASE TO THIGH TECHNIQUE: 8.9 mCi F-18 FDG was injected intravenously. Full-ring PET imaging was performed from the skull base to thigh after the radiotracer. CT data was obtained and used for attenuation correction and anatomic localization. Fasting blood glucose: 96 mg/dl COMPARISON:  Multiple exams, including cervical MRI from 07/27/2020 and CT examination from 06/11/2020 FINDINGS: Mediastinal blood pool activity: SUV max 2.3 Liver activity: SUV max N/A NECK: Large left neck mass primarily in the prevertebral space but displacing adjacent spaces observed with apparent erosion into the C3, C4, C5, C6, and possibly C7 vertebral bodies, and with known extension into the left neural foramina at C4-5, C5-6, and possibly C6-7. This mass has a maximum SUV of 20.3, and associated hypermetabolic activity measures approximately 6.8 by 6.1 by 9.3 cm. In the areas of mild T2 central cervical spinal cord signal abnormality most notably at the T6-7 level on the outside MRI from 07/27/2020, I not discern definite abnormal metabolic activity within the cord, although admittedly the signal abnormality the small. Incidental CT findings: none CHEST: Left supraclavicular node 1.7 cm in short axis on image 65/4, maximum SUV 16.7, compatible with malignancy. Bandlike 1.5 by 1.1 by 3.3 cm left upper lobe nodule appears essentially stable and has a hypermetabolic activity of only 1.4. There is some new indistinct airspace opacity posteriorly in the left upper lobe along the major fissure. This is not hypermetabolic and is likely inflammatory.  Reduced conspicuity of the previously 4 mm nodule in the left upper lobe, now barely apparent and perhaps 2 mm in diameter. Incidental CT findings: Small band of chronic scarring in the superior segment left lower lobe, unchanged. Airway thickening is most striking in the left upper lobe. Right Port-A-Cath tip: Cavoatrial junction. ABDOMEN/PELVIS: Accentuated activity in the cecum without CT correlate, likely physiologic. Activity in this vicinity measures up to 13.0. Incidental CT findings: none SKELETON: Small lytic lesion of the left anterior third rib measuring about 0.8 by 0.7 cm with surrounding sclerosis and low-grade metabolic activity, maximum SUV 3.7. On prior exams such 11/18/2019 there is pleural-based mass immediately adjacent to this rib, and I suspect that the current erosion and activity reflects a small amount of malignant invasion of this rib. However, the previous pleural thickening has resolved. Incidental CT findings: none IMPRESSION: 1. Large left prevertebral space mass as shown on recent outside cervical spine MRI from [facility] of 08/05/2020, maximum SUV 20.3, compatible with active malignancy and invading multiple vertebral bodies and neural foramina. 2. 1.7 cm in short axis left supraclavicular node with maximum SUV 16.7, compatible with active malignancy. 3. The persistent left upper lobe bandlike nodule in the apical segment has a maximum SUV of only 1.4, indicating response to treatment. Although the adjacent pleural thickening has substantially reduced over the last 10 months, there is a small lytic erosion of the left anterior third rib with adjacent sclerosis and low-grade metabolic activity likely representing residuum from malignant invasion of the rib. 4. Minimal indistinct airspace opacity posteriorly in the left upper lobe along the major fissure is not hypermetabolic and is likely inflammatory. There is some airway thickening most striking in the left  upper lobe, possibly treatment related. 5. Focally accentuated activity in the cecum, but without CT correlate hence likely physiologic. Electronically Signed   By: Van Clines M.D.   On: 08/13/2020 09:21   [place] CORE  BIOPSY (SOFT TISSUE)  Result Date: 08/11/2020 CLINICAL DATA:  History of treated lung carcinoma with large left neck mass most likely representing recurrent metastatic disease. EXAM: ULTRASOUND GUIDED CORE BIOPSY OF LEFT NECK MASS MEDICATIONS: 2.0 mg IV Versed; 100 mcg IV Fentanyl Total Moderate Sedation Time: 11 minutes. The patient's level of consciousness and physiologic status were continuously monitored during the procedure by Radiology nursing. PROCEDURE: The procedure, risks, benefits, and alternatives were explained to the patient. Questions regarding the procedure were encouraged and answered. The patient understands and consents to the procedure. A time out was performed prior to initiating the procedure. Ultrasound was performed of the left neck. The left neck was prepped with chlorhexidine in a sterile fashion, and a sterile drape was applied covering the operative field. A sterile gown and sterile gloves were used for the procedure. Local anesthesia was provided with 1% Lidocaine. Under ultrasound guidance, 18 gauge core biopsy samples were obtained of a left neck mass. Three separate core biopsy samples were obtained and submitted in formalin. Additional ultrasound was performed. COMPLICATIONS: None. FINDINGS: Large solid mass in the left neck occupies much of the lateral and posterolateral neck and measures greater than 7 cm in maximal diameter by ultrasound. Solid tissue was obtained. IMPRESSION: Ultrasound-guided core biopsy performed of a large solid mass in the left neck. Electronically Signed   By: Aletta Edouard M.D.   On: 08/11/2020 12:43   DG Outside Films Head/Face  Result Date: 08/05/2020 This examination belongs to an outside facility and is stored here for  comparison purposes only.  Contact the originating outside institution for any associated report or interpretation.  DG Outside Films Head/Face  Result Date: 08/05/2020 This examination belongs to an outside facility and is stored here for comparison purposes only.  Contact the originating outside institution for any associated report or interpretation.  DG Outside Films Extremity  Result Date: 08/05/2020 This examination belongs to an outside facility and is stored here for comparison purposes only.  Contact the originating outside institution for any associated report or interpretation.  DG Outside Films Extremity  Result Date: 08/05/2020 This examination belongs to an outside facility and is stored here for comparison purposes only.  Contact the originating outside institution for any associated report or interpretation.    Assessment and plan- Patient is a 49 y.o. male with stage IV adenocarcinoma of the lung with pleural metastases.  He is here for routine follow-up of ongoing left upper extremity weakness and neck mass  Patient is currently undergoing radiation to his paraspinal mass around his cervical vertebrae.  This will be completed by next week.  He has not recovered any significant function yet.  I will plan to start home physical therapy upon completion of radiation.  Patient will also continue to take Decadron at this time.  I will plan to stop it when I see him for his next treatment  Return to clinic in 2 weeks with labs CBC with differential and CMP for next cycle of carboplatin and [name].  NGS testing is currently pending   Visit Diagnosis 1. Paraspinal mass   2. Weakness of left upper extremity   3. Malignant neoplasm of upper lobe of left lung ([place])      Dr. Randa Evens, MD, MPH [facility] at [facility] [phone] 08/28/2020 12:38 PM

## 2020-08-28 NOTE — Progress Notes (Signed)
No new changes noted today 

## 2020-09-01 ENCOUNTER — Ambulatory Visit
Admission: RE | Admit: 2020-09-01 | Discharge: 2020-09-01 | Disposition: A | Discharge status: Home / Self Care | Payer: 59 | Source: Ambulatory Visit | Attending: Radiation Oncology | Admitting: Radiation Oncology

## 2020-09-01 DIAGNOSIS — C3412 Malignant neoplasm of upper lobe, left bronchus or lung: Secondary | ICD-10-CM | POA: Diagnosis not present

## 2020-09-02 ENCOUNTER — Encounter: Payer: Self-pay | Admitting: Oncology

## 2020-09-02 ENCOUNTER — Ambulatory Visit
Admission: RE | Admit: 2020-09-02 | Discharge: 2020-09-02 | Disposition: A | Discharge status: Home / Self Care | Payer: 59 | Source: Ambulatory Visit | Attending: Radiation Oncology | Admitting: Radiation Oncology

## 2020-09-02 DIAGNOSIS — C3412 Malignant neoplasm of upper lobe, left bronchus or lung: Secondary | ICD-10-CM | POA: Diagnosis not present

## 2020-09-03 ENCOUNTER — Ambulatory Visit: Payer: 59

## 2020-09-03 ENCOUNTER — Ambulatory Visit
Admission: RE | Admit: 2020-09-03 | Discharge: 2020-09-03 | Disposition: A | Discharge status: Home / Self Care | Payer: 59 | Source: Ambulatory Visit | Attending: Radiation Oncology | Admitting: Radiation Oncology

## 2020-09-03 DIAGNOSIS — C3412 Malignant neoplasm of upper lobe, left bronchus or lung: Secondary | ICD-10-CM | POA: Diagnosis not present

## 2020-09-04 ENCOUNTER — Ambulatory Visit
Admission: RE | Admit: 2020-09-04 | Discharge: 2020-09-04 | Disposition: A | Discharge status: Home / Self Care | Payer: 59 | Source: Ambulatory Visit | Attending: Radiation Oncology | Admitting: Radiation Oncology

## 2020-09-04 DIAGNOSIS — C3412 Malignant neoplasm of upper lobe, left bronchus or lung: Secondary | ICD-10-CM | POA: Diagnosis not present

## 2020-09-07 ENCOUNTER — Telehealth: Payer: Self-pay | Admitting: *Deleted

## 2020-09-07 NOTE — Telephone Encounter (Signed)
I called pt and got his voicemail. I did tell him that it was ok. We all run into times that we don't remember everything. His appt. Is scheduled for this Friday and we have added on an appt for wed of this week at 10:30. The appt is to be evaluated to see what kind of therapy we can get him to see if arm can get better. Asked him to call me back if he has questions.

## 2020-09-07 NOTE — Telephone Encounter (Signed)
Pt called in to ask when he can start his physical therapy. Please advise.

## 2020-09-07 NOTE — Telephone Encounter (Signed)
Patient called to report that he missed his appointment.

## 2020-09-07 NOTE — Telephone Encounter (Signed)
Do you prefer him to have home health PT or schedule him to see Gwenette Greet for evaluation?

## 2020-09-07 NOTE — Telephone Encounter (Signed)
Pt has been made aware of appt scheduled with Gwenette Greet on 9/15 at 10:30am. Pt advised to wear soft neck collar and he stated that he already has one and has been wearing it daily. Nothing further needed at this time.

## 2020-09-07 NOTE — Telephone Encounter (Signed)
Yes. Can you put in for the referral?please ask him to use soft collar during therapy

## 2020-09-07 NOTE — Telephone Encounter (Signed)
Nira Conn, can you help get pt scheduled to see Gwenette Greet to evaluate left arm weakness? You can let me know once he is scheduled and I will notify him.

## 2020-09-07 NOTE — Telephone Encounter (Signed)
Lets get him seen by maureen first

## 2020-09-09 ENCOUNTER — Inpatient Hospital Stay: Payer: 59 | Admitting: Occupational Therapy

## 2020-09-09 ENCOUNTER — Other Ambulatory Visit: Payer: Self-pay

## 2020-09-09 DIAGNOSIS — R29898 Other symptoms and signs involving the musculoskeletal system: Secondary | ICD-10-CM

## 2020-09-11 ENCOUNTER — Other Ambulatory Visit: Payer: Self-pay

## 2020-09-11 ENCOUNTER — Inpatient Hospital Stay: Payer: 59

## 2020-09-11 ENCOUNTER — Inpatient Hospital Stay (HOSPITAL_BASED_OUTPATIENT_CLINIC_OR_DEPARTMENT_OTHER): Payer: 59 | Admitting: Oncology

## 2020-09-11 VITALS — BP 112/73 | HR 90 | Temp 96.8°F | Wt 163.4 lb

## 2020-09-11 DIAGNOSIS — R945 Abnormal results of liver function studies: Secondary | ICD-10-CM | POA: Diagnosis not present

## 2020-09-11 DIAGNOSIS — R29898 Other symptoms and signs involving the musculoskeletal system: Secondary | ICD-10-CM

## 2020-09-11 DIAGNOSIS — R7989 Other specified abnormal findings of blood chemistry: Secondary | ICD-10-CM

## 2020-09-11 DIAGNOSIS — C3412 Malignant neoplasm of upper lobe, left bronchus or lung: Secondary | ICD-10-CM

## 2020-09-11 DIAGNOSIS — Z5111 Encounter for antineoplastic chemotherapy: Secondary | ICD-10-CM | POA: Diagnosis not present

## 2020-09-11 LAB — CBC WITH DIFFERENTIAL/PLATELET
Abs Immature Granulocytes: 0.5 10*3/uL — ABNORMAL HIGH (ref 0.00–0.07)
Band Neutrophils: 3 %
Basophils Absolute: 0.1 10*3/uL (ref 0.0–0.1)
Basophils Relative: 1 %
Eosinophils Absolute: 0.1 10*3/uL (ref 0.0–0.5)
Eosinophils Relative: 1 %
HCT: 31.2 % — ABNORMAL LOW (ref 39.0–52.0)
Hemoglobin: 10.4 g/dL — ABNORMAL LOW (ref 13.0–17.0)
Lymphocytes Relative: 14 %
Lymphs Abs: 1 10*3/uL (ref 0.7–4.0)
MCH: 29.5 pg (ref 26.0–34.0)
MCHC: 33.3 g/dL (ref 30.0–36.0)
MCV: 88.6 fL (ref 80.0–100.0)
Metamyelocytes Relative: 3 %
Monocytes Absolute: 1.3 10*3/uL — ABNORMAL HIGH (ref 0.1–1.0)
Monocytes Relative: 19 %
Myelocytes: 5 %
Neutro Abs: 3.9 10*3/uL (ref 1.7–7.7)
Neutrophils Relative %: 54 %
Platelets: 599 10*3/uL — ABNORMAL HIGH (ref 150–400)
RBC: 3.52 MIL/uL — ABNORMAL LOW (ref 4.22–5.81)
RDW: 14.4 % (ref 11.5–15.5)
Smear Review: INCREASED
WBC: 6.8 10*3/uL (ref 4.0–10.5)
nRBC: 0 % (ref 0.0–0.2)

## 2020-09-11 LAB — COMPREHENSIVE METABOLIC PANEL WITH GFR
ALT: 129 U/L — ABNORMAL HIGH (ref 0–44)
AST: 53 U/L — ABNORMAL HIGH (ref 15–41)
Albumin: 2.9 g/dL — ABNORMAL LOW (ref 3.5–5.0)
Alkaline Phosphatase: 157 U/L — ABNORMAL HIGH (ref 38–126)
Anion gap: 14 (ref 5–15)
BUN: 20 mg/dL (ref 6–20)
CO2: 25 mmol/L (ref 22–32)
Calcium: 8.7 mg/dL — ABNORMAL LOW (ref 8.9–10.3)
Chloride: 96 mmol/L — ABNORMAL LOW (ref 98–111)
Creatinine, Ser: 0.77 mg/dL (ref 0.61–1.24)
GFR calc Af Amer: >60 mL/min
GFR calc non Af Amer: >60 mL/min
Glucose, Bld: 163 mg/dL — ABNORMAL HIGH (ref 70–99)
Potassium: 3.6 mmol/L (ref 3.5–5.1)
Sodium: 135 mmol/L (ref 135–145)
Total Bilirubin: 0.4 mg/dL (ref 0.3–1.2)
Total Protein: 7.5 g/dL (ref 6.5–8.1)

## 2020-09-11 MED ORDER — SODIUM CHLORIDE 0.9 % IV SOLN
150.0000 mg | Freq: Once | INTRAVENOUS | Status: AC
  Administered 2020-09-11: 150 mg via INTRAVENOUS
  Filled 2020-09-11: qty 150

## 2020-09-11 MED ORDER — DIPHENHYDRAMINE HCL 50 MG/ML IJ SOLN
50.0000 mg | Freq: Once | INTRAMUSCULAR | Status: AC
  Administered 2020-09-11: 50 mg via INTRAVENOUS
  Filled 2020-09-11: qty 1

## 2020-09-11 MED ORDER — SODIUM CHLORIDE 0.9 % IV SOLN
10.0000 mg | Freq: Once | INTRAVENOUS | Status: AC
  Administered 2020-09-11: 10 mg via INTRAVENOUS
  Filled 2020-09-11: qty 10

## 2020-09-11 MED ORDER — SODIUM CHLORIDE 0.9 % IV SOLN
720.0000 mg | Freq: Once | INTRAVENOUS | Status: AC
  Administered 2020-09-11: 720 mg via INTRAVENOUS
  Filled 2020-09-11: qty 72

## 2020-09-11 MED ORDER — PEGFILGRASTIM 6 MG/0.6ML ~~LOC~~ PSKT
6.0000 mg | PREFILLED_SYRINGE | Freq: Once | SUBCUTANEOUS | Status: AC
  Administered 2020-09-11: 6 mg via SUBCUTANEOUS
  Filled 2020-09-11: qty 0.6

## 2020-09-11 MED ORDER — SODIUM CHLORIDE 0.9 % IV SOLN
Freq: Once | INTRAVENOUS | Status: AC
  Filled 2020-09-11: qty 250

## 2020-09-11 MED ORDER — HEPARIN SOD (PORK) LOCK FLUSH 100 UNIT/ML IV SOLN
INTRAVENOUS | Status: AC
  Filled 2020-09-11: qty 5

## 2020-09-11 MED ORDER — SODIUM CHLORIDE 0.9 % IV SOLN
500.0000 mg/m2 | Freq: Once | INTRAVENOUS | Status: AC
  Administered 2020-09-11: 1000 mg via INTRAVENOUS
  Filled 2020-09-11: qty 40

## 2020-09-11 MED ORDER — PALONOSETRON HCL INJECTION 0.25 MG/5ML
0.2500 mg | Freq: Once | INTRAVENOUS | Status: AC
  Administered 2020-09-11: 0.25 mg via INTRAVENOUS
  Filled 2020-09-11: qty 5

## 2020-09-11 MED ORDER — FAMOTIDINE IN NACL 20-0.9 MG/50ML-% IV SOLN
20.0000 mg | Freq: Once | INTRAVENOUS | Status: AC
  Administered 2020-09-11: 20 mg via INTRAVENOUS
  Filled 2020-09-11: qty 50

## 2020-09-11 MED ORDER — HEPARIN SOD (PORK) LOCK FLUSH 100 UNIT/ML IV SOLN
500.0000 [IU] | Freq: Once | INTRAVENOUS | Status: AC | PRN
  Administered 2020-09-11: 500 [IU]
  Filled 2020-09-11: qty 5

## 2020-09-11 NOTE — Therapy (Signed)
[facility] [address] [place], [place], [place], [address] Phone: [phone]   Fax:  [phone]  Occupational Therapy Screen  Patient Details  Name: Kyle Guerrero MRN: [id number] Date of Birth: Dec 17, 1971 No data recorded  Encounter Date: 09/09/2020   OT End of Session - 09/11/20 1604    Visit Number 0           Past Medical History:  Diagnosis Date   Asthma    Lung cancer [facility])     Past Surgical History:  Procedure Laterality Date   CYST EXCISION     IR IMAGING GUIDED PORT INSERTION  12/05/2019   VIDEO BRONCHOSCOPY WITH ENDOBRONCHIAL ULTRASOUND Left 11/22/2019   Procedure: VIDEO BRONCHOSCOPY WITH ENDOBRONCHIAL ULTRASOUND;  Surgeon: Tyler Pita, MD;  Location: [facility] ORS;  Service: Thoracic;  Laterality: Left;    There were no vitals filed for this visit.    NOTE FOR DR RAO's - Date of visit: 08/28/20  Diagnosis- Non-small cell lung cancer stage IV acT2 cN2 cM1 a with pleural involvement  Chief complaint/ Reason for visit-assess ongoing neck pain and left upper extremity weakness  Heme/Onc history: patient is a 49 year old male who presented to the ER with symptoms ofheaviness in his chest and upper left chest discomfort he underwent CT angio chest to rule out PE which showed 3.8 x 3.3 cm left upper palpable lung mass along with 4.4 x 3.3 cm lobulated mass in the aortopulmonary window and 2.6 x 2.4 cm left hilar mass all concerning for malignancy. Patient has also seen pulmonary and has been set up for bronchoscopy and EBUS guided biopsy on 11/23/2019. Patient underwent. PET CT scan which showed a hypermetabolic spiculated 3.5 cm of 5 left upper lobe lung mass, adjacent hypermetabolic 3.2 x 1.2 cm pleural metastases in the medial posterior by the left pleural space along with scalloping of the adjacent posterior left third rib. Hypermetabolic infiltrative left perihilar conglomerate nodal  metastases measuring up to 7.3 x 3.6 cm and 0.8 cm high left mediastinal node between the left brachiocephalic vein and left subclavian artery. No evidence of distant metastatic disease  Biopsy showed non-small cell lung cancer but further characterization could not be determined. Insufficient tissue for NGS testing. Repeat biopsy done. Results of NGStestingshowed PD-L1 50%. Tumor mutational burden high. 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  Patient completed concurrent chemoradiation with carbotaxol chemotherapy followed by 2 cycles of carbotaxol Keytruda andwason maintenance Keytruda  Patient was complaining of left shoulder pain and underwent MRI of the shoulder which showed8 in the supraspinatus tendon. He was subsequently seen by [facility] and underwent MRI of the cervical spine which showed a 4.6 x 4.8 x 8.2 cm mass in the left prevertebral region from C2-C7 levels. The mass partially encases the left vertebral artery and abuts the preforaminal segment. Invades the vertebral bodies and edema of the left C5 articular pillar. There is slight extension into the left C4-C5 thecal sac without central thecal sac compression. The mass displaces the left pharynx anteriorly along the left carotid sheath.Patient had a repeat biopsy which was consistent with adenocarcinoma. Patient is undergoing palliative radiation treatment and plan is to proceed with carboplatin and [name] while awaiting repeat NGS testing   Interval history-neck swelling has gone down however no recovery of left arm function yet.  Reports having some diarrhea for a day which ultimately resolved.  He takes his Decadron but does not take his oxycodone  as much.  He is looking into long-term disability   : Patient is a 49 y.o. male with stage IV adenocarcinoma of the lung with pleural metastases.  He is here for routine follow-up of ongoing left upper extremity weakness and  neck mass  Patient is currently undergoing radiation to his paraspinal mass around his cervical vertebrae.  This will be completed by next week.  He has not recovered any significant function yet.  I will plan to start home physical therapy upon completion of radiation.  Patient will also continue to take Decadron at this time.  I will plan to stop it when I see him for his next treatment  Return to clinic in 2 weeks with labs CBC with differential and CMP for next cycle of carboplatin and [name].  NGS testing is currently pending   NOTE FROM OT SCREEN 09/09/2020:   Pt refer to OT for L UE weakness- pt had mass on C2 thru C7 - finished radiation 09/04/2020 Pt report his symptoms started with " pinching pain in neck for while -and then with some weakness -  Took while to get in with ortho and then MRI to neck - showed Cancer "  Pt report he feels like some of his motion is coming back - but more in forearm and hand - shoulder still weak  Pain still 8/10  Pt is L hand dominant Pt with able to make grip Wrist AROM WFL but 4+/5 for supination  Elbow extention 5/5 , but elbow flexion 4/5 Shoulder AROM for flexion and ABD 44 degrees  Shoulder extention 36 degrees  Pain increase with shoulder AROM  Will discuss with Dr Janese Banks - if pt needs ortho consult prior to starting PT at outpt clinic - pt motivated  -use to work out in gym  Would recommend PT for eval and Tx   PET scan on 08/12/20 was done prior to last date of radiation on  09/04/20   And next scan not due until another 2 cycles of chemo per Dr Janese Banks Don't know if any restrictions or limitations - navigator Hildred Alamin is contacting [facility]  Did provide pt with HEP -for using can of food for sup/pro, elbow curls - 3 positions - palm up, thumb up and palm down - 15 reps total Scapula squeezes  Supporting hand in pants pocket when walking and if sitting support on armrest -to decrease subluxation risk at shoulder  Awaiting further info                                  Patient will benefit from skilled therapeutic intervention for PT to eval and tx            Visit Diagnosis: Weakness of left upper extremity    Problem List Patient Active Problem List   Diagnosis Date Noted   Goals of care, counseling/discussion 11/29/2019   Malignant neoplasm of upper lobe of left lung ([place]) 11/29/2019   Non-small cell cancer of left lung ([place]) 11/29/2019   Hemoptysis 11/29/2019    Rosalyn Gess OTR/L,CLT 09/11/2020, 4:05 PM  [facility] [address] [place], [place], [address] Phone: [phone]   Fax:  [phone]  Name: Kyle Guerrero MRN: [id number] Date of Birth: [birth year]

## 2020-09-11 NOTE — Progress Notes (Signed)
Carboplatin previous doses need additional premedications:  Famotidine 20 mg IVPB x 1 Diphenhydramine 50 mg IV push x 1  Per [place] Protocol  Henreitta Leber, PharmD

## 2020-09-15 NOTE — Progress Notes (Signed)
Hematology/Oncology Consult note [facility]  [phone] [phone]  Patient Care Team: Default, Provider, MD as PCP - General Telford Nab, RN as Registered Nurse Sindy Guadeloupe, MD as Consulting Physician (Hematology and Oncology) Sindy Guadeloupe, MD as Consulting Physician (Hematology and Oncology)   Name of the patient: Kyle Guerrero  [id number]  [birth year]   Date of visit: 09/15/20  Diagnosis- Non-small cell lung cancer stage IV acT2 cN2 cM1 a with pleural involvement  Chief complaint/ Reason for visit-on treatment assessment prior to cycle 2 of carbo [name] chemotherapy  Heme/Onc history: patient is a 49 year old male who presented to the ER with symptoms ofheaviness in his chest and upper left chest discomfort he underwent CT angio chest to rule out PE which showed 3.8 x 3.3 cm left upper palpable lung mass along with 4.4 x 3.3 cm lobulated mass in the aortopulmonary window and 2.6 x 2.4 cm left hilar mass all concerning for malignancy. Patient has also seen pulmonary and has been set up for bronchoscopy and EBUS guided biopsy on 11/23/2019. Patient underwent. PET CT scan which showed a hypermetabolic spiculated 3.5 cm of 5 left upper lobe lung mass, adjacent hypermetabolic 3.2 x 1.2 cm pleural metastases in the medial posterior by the left pleural space along with scalloping of the adjacent posterior left third rib. Hypermetabolic infiltrative left perihilar conglomerate nodal metastases measuring up to 7.3 x 3.6 cm and 0.8 cm high left mediastinal node between the left brachiocephalic vein and left subclavian artery. No evidence of distant metastatic disease  Biopsy showed non-small cell lung cancer but further characterization could not be determined. Insufficient tissue for NGS testing. Repeat biopsy done. Results of NGStestingshowed PD-L1 50%. Tumor mutational burden high. 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  Patient completed concurrent chemoradiation with carbotaxol chemotherapy followed by 2 cycles of carbotaxol Keytruda andwason maintenance Keytruda  Patient was complaining of left shoulder pain and underwent MRI of the shoulder which showed8 in the supraspinatus tendon. He was subsequently seen by [facility] and underwent MRI of the cervical spine which showed a 4.6 x 4.8 x 8.2 cm mass in the left prevertebral region from C2-C7 levels. The mass partially encases the left vertebral artery and abuts the preforaminal segment. Invades the vertebral bodies and edema of the left C5 articular pillar. There is slight extension into the left C4-C5 thecal sac without central thecal sac compression. The mass displaces the left pharynx anteriorly along the left carotid sheath.Patient had a repeat biopsy which was consistent with adenocarcinoma. Patient is undergoing palliative radiation treatment and plan is to proceed with carboplatin and [name] while awaiting repeat NGS testing   Interval history-he is regaining some function in his left upper extremity although he still unable to perform overhead abduction.  Neck swelling has resolved  ECOG PS- 1 Pain scale- 3 Opioid associated constipation- no  Review of systems- Review of Systems  Constitutional: Negative for chills, fever, malaise/fatigue and weight loss.  HENT: Negative for congestion, ear discharge and nosebleeds.   Eyes: Negative for blurred vision.  Respiratory: Negative for cough, hemoptysis, sputum production, shortness of breath and wheezing.   Cardiovascular: Negative for chest pain, palpitations, orthopnea and claudication.  Gastrointestinal: Negative for abdominal pain, blood in stool, constipation, diarrhea, heartburn, melena, nausea and vomiting.  Genitourinary: Negative for dysuria, flank pain, frequency, hematuria and urgency.  Musculoskeletal: Negative for back pain, joint pain  and myalgias.  Left arm pain and weakness  Skin: Negative for rash.  Neurological: Negative for dizziness, tingling, focal weakness, seizures, weakness and headaches.  Endo/Heme/Allergies: Does not bruise/bleed easily.  Psychiatric/Behavioral: Negative for depression and suicidal ideas. The patient does not have insomnia.       No Known Allergies   Past Medical History:  Diagnosis Date   Asthma    Lung cancer ([place])      Past Surgical History:  Procedure Laterality Date   CYST EXCISION     IR IMAGING GUIDED PORT INSERTION  12/05/2019   VIDEO BRONCHOSCOPY WITH ENDOBRONCHIAL ULTRASOUND Left 11/22/2019   Procedure: VIDEO BRONCHOSCOPY WITH ENDOBRONCHIAL ULTRASOUND;  Surgeon: Tyler Pita, MD;  Location: [facility] ORS;  Service: Thoracic;  Laterality: Left;    Social History   Socioeconomic History   Marital status: Single    Spouse name: Not on file   Number of children: Not on file   Years of education: Not on file   Highest education level: Not on file  Occupational History   Not on file  Tobacco Use   Smoking status: Former Smoker    Packs/day: 2.00    Types: Cigarettes    Quit date: 11/08/2019    Years since quitting: 0.8   Smokeless tobacco: Never Used  Vaping Use   Vaping Use: Never used  Substance and Sexual Activity   Alcohol use: Not Currently   Drug use: Yes    Types: Marijuana   Sexual activity: Not on file  Other Topics Concern   Not on file  Social History Narrative   Not on file   Social Determinants of Health   Financial Resource Strain:    Difficulty of Paying Living Expenses: Not on file  Food Insecurity:    Worried About Charity fundraiser in the Last Year: Not on file   [employer] of Food in the Last Year: Not on file  Transportation Needs:    Lack of Transportation (Medical): Not on file   Lack of Transportation (Non-Medical): Not on file  Physical Activity:    Days of Exercise per Week: Not on file    Minutes of Exercise per Session: Not on file  Stress:    Feeling of Stress : Not on file  Social Connections:    Frequency of Communication with Friends and Family: Not on file   Frequency of Social Gatherings with Friends and Family: Not on file   Attends Religious Services: Not on file   Active Member of Clubs or Organizations: Not on file   Attends Archivist Meetings: Not on file   Marital Status: Not on file  Intimate Partner Violence:    Fear of Current or Ex-Partner: Not on file   Emotionally Abused: Not on file   Physically Abused: Not on file   Sexually Abused: Not on file    Family History  Problem Relation Age of Onset   Healthy Mother    Healthy Father      Current Outpatient Medications:    acetaminophen (TYLENOL) 325 MG tablet, Take 650 mg by mouth every 6 (six) hours as needed., Disp: , Rfl:    azelastine (ASTELIN) 0.1 % nasal spray, Place 1 spray into the nose as needed. , Disp: , Rfl:    folic acid (FOLVITE) 1 MG tablet, Take 1 tablet (1 mg total) by mouth daily. Start 5-7 days before [name] chemotherapy. Continue until 21 days after [name] completed., Disp: 100 tablet, Rfl: 3   lidocaine-prilocaine (EMLA)  cream, Apply to affected area once, Disp: 30 g, Rfl: 3   oxyCODONE (OXY IR/ROXICODONE) 5 MG immediate release tablet, Take 1 tablet (5 mg total) by mouth every 4 (four) hours as needed for moderate pain or severe pain., Disp: 180 tablet, Rfl: 0   sucralfate (CARAFATE) 1 g tablet, Take 1 tablet (1 g total) by mouth 3 (three) times daily before meals. Dissolve in warm water, swish and swallow, Disp: 90 tablet, Rfl: 6   dexamethasone (DECADRON) 4 MG tablet, Take 1 tablet (4 mg total) by mouth 2 (two) times daily with a meal. (Patient not taking: Reported on 09/11/2020), Disp: 60 tablet, Rfl: 0   ondansetron (ZOFRAN) 8 MG tablet, Take 1 tablet (8 mg total) by mouth 2 (two) times daily as needed (Nausea or vomiting). Start if needed on the  third day after chemotherapy. (Patient not taking: Reported on 08/21/2020), Disp: 30 tablet, Rfl: 1   prochlorperazine (COMPAZINE) 10 MG tablet, Take 1 tablet (10 mg total) by mouth every 6 (six) hours as needed (Nausea or vomiting). (Patient not taking: Reported on 08/21/2020), Disp: 30 tablet, Rfl: 1 No current facility-administered medications for this visit.  Facility-Administered Medications Ordered in Other Visits:    sodium chloride flush (NS) 0.9 % injection 10 mL, 10 mL, Intravenous, PRN, Sindy Guadeloupe, MD, 10 mL at 05/01/20 0900  Physical exam:  Vitals:   09/11/20 0846 09/11/20 0932  BP: 112/73   Pulse: (!) 112 90  Temp: (!) 96.8 F (36 C)   TempSrc: Tympanic   SpO2: 100%   Weight: 163 lb 6.4 oz (74.1 kg)    Physical Exam Cardiovascular:     Rate and Rhythm: Regular rhythm. Tachycardia present.     Heart sounds: Normal heart sounds.  Pulmonary:     Effort: Pulmonary effort is normal.     Breath sounds: Normal breath sounds.  Abdominal:     General: Bowel sounds are normal.     Palpations: Abdomen is soft.  Musculoskeletal:     Comments: Patient is able to abduct his shoulder for the initial 50 degrees of abduction.  Movements that left elbow joint as well as use of forearm and hand muscles is intact  Skin:    General: Skin is warm and dry.  Neurological:     Mental Status: He is alert and oriented to person, place, and time.      CMP Latest Ref Rng & Units 09/11/2020  Glucose 70 - 99 mg/dL 163(H)  BUN 6 - 20 mg/dL 20  Creatinine 0.61 - 1.24 mg/dL 0.77  Sodium 135 - 145 mmol/L 135  Potassium 3.5 - 5.1 mmol/L 3.6  Chloride 98 - 111 mmol/L 96(L)  CO2 22 - 32 mmol/L 25  Calcium 8.9 - 10.3 mg/dL 8.7(L)  Total Protein 6.5 - 8.1 g/dL 7.5  Total Bilirubin 0.3 - 1.2 mg/dL 0.4  Alkaline Phos 38 - 126 U/L 157(H)  AST 15 - 41 U/L 53(H)  ALT 0 - 44 U/L 129(H)   CBC Latest Ref Rng & Units 09/11/2020  WBC 4.0 - 10.5 K/uL 6.8  Hemoglobin 13.0 - 17.0 g/dL 10.4(L)   Hematocrit 39 - 52 % 31.2(L)  Platelets 150 - 400 K/uL 599(H)    Assessment and plan- Patient is a 49 y.o. male with stage IV adenocarcinoma of the lung with pleural metastases.    He now presents with a large neck mass with biopsy consistent with previously diagnosed non-small cell lung cancer.  He is here for on  treatment assessment prior to cycle 2 of carbo [name] chemotherapy  Patient has completed palliative radiation treatment to his neck.  I have asked him to slowly wean himself off of the steroids over the next week to 10 days.  He has made some improvement in his left upper extremity function.  He is still unable to have significant range of motion at the shoulder but it has improved as compared to before.  He is also able to use his forearm and left hand muscles to a good degree.  We will touch base with orthopedics to see if there would be any restrictions for physical therapy otherwise he will proceed with outpatient physical therapy to optimize his left upper extremity function  Counts are otherwise okay to proceed with cycle 2 of carbo [name] chemotherapy today with on pro-Neulasta support.  I will see him back in 3 weeks for cycle 3.  Plan is to repeat scans after 4 cycles.  Repeat NGS testing on the neck mass did not reveal any and TRK mutation which was seen in his peripheral blood at diagnosis.  No actionable mutations seen on his present NGS specimen.  If he does not have significant response to present line of chemotherapy I will consider getting NGS testing on his peripheral blood  Abnormal LFTs: Likely secondary to chemotherapy.  Continue to monitor   Visit Diagnosis 1. Encounter for antineoplastic chemotherapy   2. Malignant neoplasm of upper lobe of left lung (HCC)   3. Weakness of left upper extremity      Dr. Randa Evens, MD, MPH [facility] at [facility] [phone] 09/15/2020 8:04 AM

## 2020-09-16 ENCOUNTER — Other Ambulatory Visit: Payer: Self-pay | Admitting: *Deleted

## 2020-09-16 DIAGNOSIS — C3490 Malignant neoplasm of unspecified part of unspecified bronchus or lung: Secondary | ICD-10-CM

## 2020-09-16 DIAGNOSIS — R29898 Other symptoms and signs involving the musculoskeletal system: Secondary | ICD-10-CM

## 2020-09-29 ENCOUNTER — Ambulatory Visit: Payer: 59 | Attending: Oncology | Admitting: Physical Therapy

## 2020-09-29 ENCOUNTER — Other Ambulatory Visit: Payer: Self-pay

## 2020-09-29 ENCOUNTER — Encounter: Payer: Self-pay | Admitting: Physical Therapy

## 2020-09-29 DIAGNOSIS — M25612 Stiffness of left shoulder, not elsewhere classified: Secondary | ICD-10-CM | POA: Diagnosis present

## 2020-09-29 DIAGNOSIS — R293 Abnormal posture: Secondary | ICD-10-CM | POA: Diagnosis present

## 2020-09-29 DIAGNOSIS — M25512 Pain in left shoulder: Secondary | ICD-10-CM | POA: Diagnosis present

## 2020-09-29 NOTE — Therapy (Addendum)
[facility] [address] [place], [address] Phone: [phone]   Fax:  [phone]  Physical Therapy Evaluation  Patient Details  Name: Kyle Guerrero MRN: [id number] Date of Birth: 02-Jul-1971 No data recorded  Encounter Date: 09/29/2020   PT End of Session - 09/29/20 1400    Visit Number 1    Number of Visits 17    Date for PT Re-Evaluation 11/27/20    PT Start Time 0145    PT Stop Time 0230    PT Time Calculation (min) 45 min    Activity Tolerance Patient tolerated treatment well    Behavior During Therapy [facility] for tasks assessed/performed           Past Medical History:  Diagnosis Date  . Asthma   . Lung cancer [facility])     Past Surgical History:  Procedure Laterality Date  . CYST EXCISION    . IR IMAGING GUIDED PORT INSERTION  12/05/2019  . VIDEO BRONCHOSCOPY WITH ENDOBRONCHIAL ULTRASOUND Left 11/22/2019   Procedure: VIDEO BRONCHOSCOPY WITH ENDOBRONCHIAL ULTRASOUND;  Surgeon: Tyler Pita, MD;  Location: [facility] ORS;  Service: Thoracic;  Laterality: Left;    There were no vitals filed for this visit.     .  Subjective Assessment - 09/29/20 1356    Pertinent History Patient with c/o L shoulder pain and decreased function for multiple months prior to MRI of the shoulder/neck revealing mass partially encasing the left vertebral artery and abutting to the preforaminal segment; invading the vertebral bodies and edema of the left C5 articular pillar. Underwent radiation finishing on 09/04/20, with regaining of LUE function since. Currently undergoing chemo Infusion 1x every 3 weeks. Pertinent PMH of non-small cell lung cancer, in remission since summer 2021 following concurrent chemoradiation with carbotaxol chemotherapy followed by 2 cycles of carbotaxol Keytruda and was on maintenance Keytruda. Reports initially he did not have any function in LUE and has been working on moving his LUE more.  Reports he has some tingling in palmar side thumb, pointer finger, and middle finger; but reports normal sensations to hot/cold, sharp/dull, etc. Continues to have pain along medial border of L scapula that is aching in nature, that is always an 6/10, gets up to 8/10 with heavy lifting and when pressure is applied to ie (ie sleeping). 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 Patient enjoyed shooting pool and playing basketball prior to diagnosis. Patient is L handed and reports he cannot reach overhead with anything of weight, can bathe and groom hair at this point. Pt denies N/V, B&B changes, unexplained weight fluctuation, saddle paresthesia, fever, night sweats, or unrelenting night pain at this time.    Limitations House hold activities;Lifting    How long can you sit comfortably? no time    How long can you stand comfortably? no time    How long can you walk comfortably? no time    Patient Stated Goals Regain strength in L arm    Currently in Pain? Yes    Pain Score 6     Pain Location Scapula    Pain Orientation Left;Medial;Upper    Pain Descriptors / Indicators Aching;Dull    Pain Type Acute pain    Pain Radiating Towards none    Pain Onset More than a month ago    Pain Frequency Constant    Aggravating  Factors  laying on it, putting pressure on it    Pain Relieving Factors none    Effect of Pain on Daily Activities unable to complete lifting, overhead activity            OBJECTIVE  MUSCULOSKELETAL: Tremor: Normal Bulk: Normal Tone: Normal  Cervical Screen AROM: R/L Rotation 35d bilat no pain Lateral Bending 41d bilat Ext 21d Flex WNL Spurlings A (ipsilateral lateral flexion/axial compression):Negative bilat Spurlings B (ipsilateral lateral flexion/contralateral rotation/axial compression): Negative bilat Repeated movement: No centralization or peripheralization with  protraction or retraction Hoffman Sign (cervical cord compression): Negative bilat ULTT Median: Negative bilat ULTT Ulnar: Negative bilat ULTT Radial: Negative bilat  Elbow Screen Elbow AROM: WNL bilat  Palpation TTP with withdrawal to proximal bicep tendon, and rhomboid major/minor at medial scapular insertion (concordant pain sign); tenderness to palpation at anterior shoulder, reports it is "more sensation to touch than before"  Strength L/R 3+/5 Shoulder flexion (anterior deltoid/pec major/coracobrachialis, axillary n. (C5-6) and musculocutaneous n. (C5-7)) 3+/5 Shoulder abduction (deltoid/supraspinatus, axillary/suprascapular n, C5) 3/5 Shoulder external rotation (infraspinatus/teres minor) 3+/5 Shoulder internal rotation (subcapularis/lats/pec major) 4/5 Shoulder extension (posterior deltoid, lats, teres major, axillary/thoracodorsal n.) 5/5 Elbow flexion (biceps brachii, brachialis, brachioradialis, musculoskeletal n, C5-6) 5/5 Elbow extension (triceps, radial n, C7) 5/5 Wrist Extension 5/5 Wrist Flexion 5/5 Finger adduction (interossei, ulnar n, T1) 3-/4+ Y lower trap 3/5 T scapular retractors  AROM R/L 180/180 Shoulder flexion 180/180 Shoulder abduction 90/90 Shoulder external rotation 70/70 Shoulder internal rotation 60/60 Shoulder extension All WNL with some lateral lean compensations with abd and flex  PROM R/L All WNL  Accessory Motions/Glides Glenohumeral: All WNL  Acromioclavicular:  All WNL  Sternoclavicular: All WNL  Scapulothoracic: All motions WNL; with active overhead motion scapular dyskinesis with elevation and upward rotation, able to correct with scapular assist   Muscle Length Testing Pectoralis Minor: Mildly shortened bilat Biceps: R: normal L: tension at ed range elbow ext  NEUROLOGICAL:  Mental Status Patient is oriented to person, place and time.  Recent memory is intact.  Remote memory is intact.  Attention span and  concentration are intact.  Expressive speech is intact.  Patient's fund of knowledge is within normal limits for educational level.  Sensation Grossly intact to light touch bilateral UE as determined by testing dermatomes C2-T2 Proprioception and hot/cold testing deferred on this date   SPECIAL TESTS  Rotator Cuff  Drop Arm Test: Negative bilat Painful Arc (Pain from 60 to 120 degrees scaption):  Negative bilat Infraspinatus Muscle Test: Positive LUE  Subacromial Impingement Hawkins-Kennedy:  Negative bilat Neer (Block scapula, PROM flexion): Negative bilat Painful Arc (Pain from 60 to 120 degrees scaption):  Negative bilat Empty Can: Positive LUE weakness, no pain  External Rotation Resistance:  Positive LUE Horizontal Adduction:  Negative bilat Scapular Assist:  Positive LUE  Labral Tear Biceps Load II (120 elevation, full ER, 90 elbow flexion, full supination, resisted elbow flexion): Negative  Crank (160 scaption, axial load with IR/ER):  Negative  Active Compression Test: Negative   Bicep Tendon Pathology Speed (shoulder flexion to 90, external rotation, full elbow extension, and forearm supination with resistance: Positive Yergason's (resisted shoulder ER and supination/biceps tendon pathology): Negative  Shoulder Instability Sulcus Sign: Negative  Anterior Apprehension: Negative   Ther-Ex PT reviewed the following HEP with patient with patient able to demonstrate a set of the following with min cuing for correction needed. PT educated patient on parameters of therex (how/when to inc/decrease intensity, frequency, rep/set range, stretch hold time,  and purpose of therex) with verbalized understanding.  Access Code: [email or web address] Standing High Row with Resistance - 1 x daily - 7 x weekly - 3 sets - 10 reps Standing Shoulder Row with Anchored Resistance - 1 x daily - 7 x weekly - 3 sets - 10 reps Prone Scapular Retraction Arms at Side - 1 x daily - 7 x weekly - 3 sets - 10  reps Seated Scapular Retraction - 8 x daily - 7 x weekly - 20 reps - 2sec hold               Objective measurements completed on examination: See above findings.               PT Education - 09/29/20 1400    Education Details Patient was educated on diagnosis, anatomy and pathology involved, prognosis, role of PT, and was given an HEP, demonstrating exercise with proper form following verbal and tactile cues, and was given a paper hand out to continue exercise at home. Pt was educated on and agreed to plan of care.    Person(s) Educated Patient    Methods Explanation;Demonstration;Verbal cues;Tactile cues    Comprehension Verbalized understanding;Returned demonstration;Verbal cues required;Tactile cues required            PT Short Term Goals - 09/30/20 1153      PT SHORT TERM GOAL #1   Title Pt will be independent with HEP in order to improve strength and decrease pain in order to improve pain-free function at home and work.    Baseline 09/29/20 HEP given    Time 4    Period Weeks    Status New             PT Long Term Goals - 09/30/20 1153      PT LONG TERM GOAL #1   Title Patient will increase FOTO score to 63 to demonstrate predicted increase in functional mobility to complete ADLs    Baseline 09/29/20 48    Time 8    Period Weeks    Status New      PT LONG TERM GOAL #2   Title Pt will decrease worst pain as reported on NPRS by at least 3 points in order to demonstrate clinically significant reduction in pain.    Baseline 09/29/20 8/10    Time 8    Period Weeks    Status New      PT LONG TERM GOAL #3   Title Patient will demonstrate at least 4+/5 gross L shoulder and periscapular strength to demonstrate symmetrical PLOF and strength needed for heavy household chores    Baseline 09/29/20 L shoulder flex: 3+/5 ABD 3+/5 ER 3/5 IR 3+/5 Y 3-/5 T 3/5    Time 8    Period Weeks    Status New                  Plan - 09/30/20 1314    Clinical  Impression Statement Pt is a 49 year old male presenting with LUE weakness and dyfunction following cervical carcinoma treatment. Impairments in scapular dyskensis with overhead mobility, decreased shoulder and periscapular strength, hypersensitivity, pain, and decreased activity tolerance. Activity limitations in overhead lifting, sleeping, pushing, and pulling; inhibiting full participation in ADLs and community activity (basketball, shooting pool). Pt will benefit from skilled PT to address impairments to return to ind PLOF.    Personal Factors and Comorbidities Comorbidity 1;Fitness;Comorbidity 2;Profession;Time since onset of injury/illness/exacerbation;Past/Current Experience    Comorbidities asthma, non  small cell lung cancer    Examination-Activity Limitations Bathing;Reach Overhead;Lift;Sleep;Carry;Bed Mobility    Examination-Participation Restrictions Occupation;Community Activity;Meal Prep;Laundry;Cleaning    Stability/Clinical Decision Making Evolving/Moderate complexity    Clinical Decision Making Moderate    Rehab Potential Good    PT Frequency 2x / week    PT Duration 8 weeks    PT Treatment/Interventions ADLs/Self Care Home Management;Cryotherapy;Iontophoresis 4mg /ml Dexamethasone;Functional mobility training;Patient/family education;Manual techniques;Joint Manipulations;Spinal Manipulations;Dry needling;Manual lymph drainage;Taping;Therapeutic exercise;Gait training;Traction;Ultrasound;Electrical Stimulation;Moist Heat;DME Instruction;Therapeutic activities;Neuromuscular re-education    PT Next Visit Plan HEP review, overhead mobility and strengthening    PT Home Exercise Plan rows, high rows, Prone T    Consulted and Agree with Plan of Care Patient           Patient will benefit from skilled therapeutic intervention in order to improve the following deficits and impairments:  Decreased mobility, Increased muscle spasms, Impaired sensation, Improper body mechanics, Impaired tone,  Decreased range of motion, Decreased strength, Increased fascial restricitons, Impaired flexibility, Impaired UE functional use, Postural dysfunction, Pain, Decreased activity tolerance  Visit Diagnosis: Left shoulder pain, unspecified chronicity  Stiffness of left shoulder, not elsewhere classified  Abnormal posture     Problem List Patient Active Problem List   Diagnosis Date Noted  . Goals of care, counseling/discussion 11/29/2019  . Malignant neoplasm of upper lobe of left lung ([place]) 11/29/2019  . Non-small cell cancer of left lung ([place]) 11/29/2019  . Hemoptysis 11/29/2019    Durwin Reges 09/30/2020, 1:34 PM  [facility] [address], [place], [address] Phone: [phone]   Fax:  [phone]  Name: Kyle Guerrero MRN: [id number] Date of Birth: 01/10/1971

## 2020-09-30 NOTE — Addendum Note (Signed)
Addended by: Kelton Pillar on: 09/30/2020 01:36 PM   Modules accepted: Orders

## 2020-10-01 ENCOUNTER — Other Ambulatory Visit: Payer: Self-pay

## 2020-10-01 ENCOUNTER — Ambulatory Visit: Payer: 59 | Admitting: Physical Therapy

## 2020-10-01 ENCOUNTER — Encounter: Payer: Self-pay | Admitting: Physical Therapy

## 2020-10-01 ENCOUNTER — Encounter: Payer: Self-pay | Admitting: Oncology

## 2020-10-01 DIAGNOSIS — M25512 Pain in left shoulder: Secondary | ICD-10-CM | POA: Diagnosis not present

## 2020-10-01 DIAGNOSIS — R293 Abnormal posture: Secondary | ICD-10-CM

## 2020-10-01 DIAGNOSIS — M25612 Stiffness of left shoulder, not elsewhere classified: Secondary | ICD-10-CM

## 2020-10-01 NOTE — Therapy (Signed)
[facility] [address], [place], [address] Phone: [phone]   Fax:  [phone]  Physical Therapy Treatment  Patient Details  Name: Kyle Guerrero MRN: [id number] Date of Birth: Mar 19, 1971 No data recorded  Encounter Date: 10/01/2020   PT End of Session - 10/01/20 1406    Visit Number 2    Number of Visits 17    Date for PT Re-Evaluation 11/27/20    PT Start Time 0147    PT Stop Time 0226    PT Time Calculation (min) 39 min    Activity Tolerance Patient tolerated treatment well    Behavior During Therapy [facility] for tasks assessed/performed           Past Medical History:  Diagnosis Date  . Asthma   . Lung cancer [facility])     Past Surgical History:  Procedure Laterality Date  . CYST EXCISION    . IR IMAGING GUIDED PORT INSERTION  12/05/2019  . VIDEO BRONCHOSCOPY WITH ENDOBRONCHIAL ULTRASOUND Left 11/22/2019   Procedure: VIDEO BRONCHOSCOPY WITH ENDOBRONCHIAL ULTRASOUND;  Surgeon: Tyler Pita, MD;  Location: [facility] ORS;  Service: Thoracic;  Laterality: Left;    There were no vitals filed for this visit.   Subjective Assessment - 10/01/20 1351    Subjective Pt reports he is feeling okay overall, no pain. Reports compliance with HEP, which is going well.    Pertinent History Patient with c/o L shoulder pain and decreased function for multiple months prior to MRI of the shoulder/neck revealing mass partially encasing the left vertebral artery and abutting to the preforaminal segment; invading the vertebral bodies and edema of the left C5 articular pillar. Underwent radiation finishing on 09/04/20, with regaining of LUE function since. Currently undergoing chemo Infusion 1x every 3 weeks. Pertinent PMH of non-small cell lung cancer, in remission since summer 2021 following concurrent chemoradiation with carbotaxol chemotherapy followed by 2 cycles of carbotaxol Keytruda and was on maintenance Keytruda.  Reports initially he did not have any function in LUE and has been working on moving his LUE more. Reports he has some tingling in palmar side thumb, pointer finger, and middle finger; but reports normal sensations to hot/cold, sharp/dull, etc. Continues to have pain along medial border of L scapula that is aching in nature, that is always an 6/10, gets up to 8/10 with heavy lifting and when pressure is applied to ie (ie sleeping). 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 Patient enjoyed shooting pool and playing basketball prior to diagnosis. Patient is L handed and reports he cannot reach overhead with anything of weight, can bathe and groom hair at this point. Pt denies N/V, B&B changes, unexplained weight fluctuation, saddle paresthesia, fever, night sweats, or unrelenting night pain at this time.    Limitations House hold activities;Lifting    How long can you sit comfortably? no time    How long can you stand comfortably? no time    How long can you walk comfortably? no time    Patient Stated Goals Regain strength in L arm    Pain Onset More than a month ago               Ther-Ex - Arm bike L2 51mins gentle strengthening  - Total gym pull up L11 3x 10 with min cuing for scapulo-humeral rhythm with good carry over -  Total gym row L3 3x 6/7/6 with min cuing for scapular retraction with good carry over - Supine  - Standing ER YTB 3x 8 (attempted RTB unable) with max cuing to prevent trunk rotation compensation with good carry over - Supine chest press 1# DB bilat x10; 2# 2x 10 with cuing for controlled lower with scapular retraction with good carry over  -Scapular retractions x10 3sec  - Visual review of high and neutral rows with good understanding from paitent                    PT Education - 10/01/20 1400    Education Details HEP review, therex form/technique     Person(s) Educated Patient    Methods Explanation;Demonstration;Verbal cues    Comprehension Verbalized understanding;Returned demonstration;Verbal cues required            PT Short Term Goals - 09/30/20 1153      PT SHORT TERM GOAL #1   Title Pt will be independent with HEP in order to improve strength and decrease pain in order to improve pain-free function at home and work.    Baseline 09/29/20 HEP given    Time 4    Period Weeks    Status New             PT Long Term Goals - 09/30/20 1153      PT LONG TERM GOAL #1   Title Patient will increase FOTO score to 63 to demonstrate predicted increase in functional mobility to complete ADLs    Baseline 09/29/20 48    Time 8    Period Weeks    Status New      PT LONG TERM GOAL #2   Title Pt will decrease worst pain as reported on NPRS by at least 3 points in order to demonstrate clinically significant reduction in pain.    Baseline 09/29/20 8/10    Time 8    Period Weeks    Status New      PT LONG TERM GOAL #3   Title Patient will demonstrate at least 4+/5 gross L shoulder and periscapular strength to demonstrate symmetrical PLOF and strength needed for heavy household chores    Baseline 09/29/20 L shoulder flex: 3+/5 ABD 3+/5 ER 3/5 IR 3+/5 Y 3-/5 T 3/5    Time 8    Period Weeks    Status New                 Plan - 10/01/20 1420    Clinical Impression Statement PT initiated therex progression for increased L shoulder and periscapular strength with success. Patient is able to comply with cuing for proper technique of therex with good scauplohumeral rhthym with good motivation throughout session. Pt demonstrates and verbalizes understanding of of HEP. PT will continue progression as able.    Personal Factors and Comorbidities Comorbidity 1;Fitness;Comorbidity 2;Profession;Time since onset of injury/illness/exacerbation;Past/Current Experience    Comorbidities asthma, non small cell lung cancer    Examination-Activity  Limitations Bathing;Reach Overhead;Lift;Sleep;Carry;Bed Mobility    Examination-Participation Restrictions Occupation;Community Activity;Meal Prep;Laundry;Cleaning    Clinical Decision Making Moderate    Rehab Potential Good    PT Frequency 2x / week    PT Duration 8 weeks    PT Treatment/Interventions ADLs/Self Care Home Management;Cryotherapy;Iontophoresis 4mg /ml Dexamethasone;Functional mobility training;Patient/family education;Manual techniques;Joint Manipulations;Spinal Manipulations;Dry needling;Manual lymph drainage;Taping;Therapeutic exercise;Gait training;Traction;Ultrasound;Electrical Stimulation;Moist Heat;DME Instruction;Therapeutic activities;Neuromuscular re-education    PT Next Visit Plan HEP review, overhead mobility and strengthening    PT Home  Exercise Plan rows, high rows, Prone T    Consulted and Agree with Plan of Care Patient           Patient will benefit from skilled therapeutic intervention in order to improve the following deficits and impairments:  Decreased mobility, Increased muscle spasms, Impaired sensation, Improper body mechanics, Impaired tone, Decreased range of motion, Decreased strength, Increased fascial restricitons, Impaired flexibility, Impaired UE functional use, Postural dysfunction, Pain, Decreased activity tolerance  Visit Diagnosis: Left shoulder pain, unspecified chronicity  Stiffness of left shoulder, not elsewhere classified  Abnormal posture     Problem List Patient Active Problem List   Diagnosis Date Noted  . Goals of care, counseling/discussion 11/29/2019  . Malignant neoplasm of upper lobe of left lung ([place]) 11/29/2019  . Non-small cell cancer of left lung ([place]) 11/29/2019  . Hemoptysis 11/29/2019   Durwin Reges DPT Durwin Reges 10/01/2020, 2:25 PM  [facility] [address] [place], [address] Phone: [phone]   Fax:  [phone]  Name:  Kyle Guerrero MRN: [id number] Date of Birth: 1971-08-19

## 2020-10-02 ENCOUNTER — Inpatient Hospital Stay: Payer: 59 | Attending: Oncology

## 2020-10-02 ENCOUNTER — Inpatient Hospital Stay: Payer: 59

## 2020-10-02 ENCOUNTER — Inpatient Hospital Stay (HOSPITAL_BASED_OUTPATIENT_CLINIC_OR_DEPARTMENT_OTHER): Payer: 59 | Admitting: Oncology

## 2020-10-02 VITALS — BP 112/78 | HR 86 | Temp 98.0°F | Resp 20 | Wt 163.4 lb

## 2020-10-02 DIAGNOSIS — Z7952 Long term (current) use of systemic steroids: Secondary | ICD-10-CM | POA: Diagnosis not present

## 2020-10-02 DIAGNOSIS — C3490 Malignant neoplasm of unspecified part of unspecified bronchus or lung: Secondary | ICD-10-CM

## 2020-10-02 DIAGNOSIS — D701 Agranulocytosis secondary to cancer chemotherapy: Secondary | ICD-10-CM | POA: Diagnosis not present

## 2020-10-02 DIAGNOSIS — G893 Neoplasm related pain (acute) (chronic): Secondary | ICD-10-CM | POA: Insufficient documentation

## 2020-10-02 DIAGNOSIS — Z79891 Long term (current) use of opiate analgesic: Secondary | ICD-10-CM | POA: Diagnosis not present

## 2020-10-02 DIAGNOSIS — C3412 Malignant neoplasm of upper lobe, left bronchus or lung: Secondary | ICD-10-CM | POA: Diagnosis present

## 2020-10-02 DIAGNOSIS — Z87891 Personal history of nicotine dependence: Secondary | ICD-10-CM | POA: Insufficient documentation

## 2020-10-02 DIAGNOSIS — Z79899 Other long term (current) drug therapy: Secondary | ICD-10-CM | POA: Diagnosis not present

## 2020-10-02 DIAGNOSIS — C7989 Secondary malignant neoplasm of other specified sites: Secondary | ICD-10-CM | POA: Diagnosis not present

## 2020-10-02 DIAGNOSIS — C782 Secondary malignant neoplasm of pleura: Secondary | ICD-10-CM | POA: Diagnosis not present

## 2020-10-02 DIAGNOSIS — Z5111 Encounter for antineoplastic chemotherapy: Secondary | ICD-10-CM | POA: Diagnosis not present

## 2020-10-02 LAB — CBC WITH DIFFERENTIAL/PLATELET
Abs Immature Granulocytes: 0.56 10*3/uL — ABNORMAL HIGH (ref 0.00–0.07)
Basophils Absolute: 0.1 10*3/uL (ref 0.0–0.1)
Basophils Relative: 1 %
Eosinophils Absolute: 0.1 10*3/uL (ref 0.0–0.5)
Eosinophils Relative: 1 %
HCT: 32.1 % — ABNORMAL LOW (ref 39.0–52.0)
Hemoglobin: 10.6 g/dL — ABNORMAL LOW (ref 13.0–17.0)
Immature Granulocytes: 8 %
Lymphocytes Relative: 11 %
Lymphs Abs: 0.8 10*3/uL (ref 0.7–4.0)
MCH: 29.9 pg (ref 26.0–34.0)
MCHC: 33 g/dL (ref 30.0–36.0)
MCV: 90.7 fL (ref 80.0–100.0)
Monocytes Absolute: 1.1 10*3/uL — ABNORMAL HIGH (ref 0.1–1.0)
Monocytes Relative: 15 %
Neutro Abs: 4.7 10*3/uL (ref 1.7–7.7)
Neutrophils Relative %: 64 %
Platelets: 432 10*3/uL — ABNORMAL HIGH (ref 150–400)
RBC: 3.54 MIL/uL — ABNORMAL LOW (ref 4.22–5.81)
RDW: 16.1 % — ABNORMAL HIGH (ref 11.5–15.5)
WBC: 7.3 10*3/uL (ref 4.0–10.5)
nRBC: 0 % (ref 0.0–0.2)

## 2020-10-02 LAB — COMPREHENSIVE METABOLIC PANEL
ALT: 34 U/L (ref 0–44)
AST: 24 U/L (ref 15–41)
Albumin: 3.4 g/dL — ABNORMAL LOW (ref 3.5–5.0)
Alkaline Phosphatase: 102 U/L (ref 38–126)
Anion gap: 9 (ref 5–15)
BUN: 13 mg/dL (ref 6–20)
CO2: 27 mmol/L (ref 22–32)
Calcium: 8.8 mg/dL — ABNORMAL LOW (ref 8.9–10.3)
Chloride: 100 mmol/L (ref 98–111)
Creatinine, Ser: 0.79 mg/dL (ref 0.61–1.24)
GFR calc non Af Amer: >60 mL/min (ref >60)
Glucose, Bld: 95 mg/dL (ref 70–99)
Potassium: 3.7 mmol/L (ref 3.5–5.1)
Sodium: 136 mmol/L (ref 135–145)
Total Bilirubin: 0.5 mg/dL (ref 0.3–1.2)
Total Protein: 7.3 g/dL (ref 6.5–8.1)

## 2020-10-02 MED ORDER — OXYCODONE HCL 5 MG PO TABS
5.0000 mg | ORAL_TABLET | ORAL | 0 refills | Status: DC | PRN
Start: 2020-10-02 — End: 2020-11-27

## 2020-10-02 MED ORDER — SODIUM CHLORIDE 0.9 % IV SOLN
10.0000 mg | Freq: Once | INTRAVENOUS | Status: AC
  Administered 2020-10-02: 10 mg via INTRAVENOUS
  Filled 2020-10-02: qty 10

## 2020-10-02 MED ORDER — HEPARIN SOD (PORK) LOCK FLUSH 100 UNIT/ML IV SOLN
500.0000 [IU] | Freq: Once | INTRAVENOUS | Status: DC | PRN
  Filled 2020-10-02: qty 5

## 2020-10-02 MED ORDER — SODIUM CHLORIDE 0.9 % IV SOLN
Freq: Once | INTRAVENOUS | Status: AC
  Filled 2020-10-02: qty 250

## 2020-10-02 MED ORDER — CYANOCOBALAMIN 1000 MCG/ML IJ SOLN
1000.0000 ug | Freq: Once | INTRAMUSCULAR | Status: AC
  Administered 2020-10-02: 1000 ug via INTRAMUSCULAR
  Filled 2020-10-02: qty 1

## 2020-10-02 MED ORDER — SODIUM CHLORIDE 0.9% FLUSH
10.0000 mL | INTRAVENOUS | Status: DC | PRN
  Administered 2020-10-02: 10 mL via INTRAVENOUS
  Filled 2020-10-02: qty 10

## 2020-10-02 MED ORDER — SODIUM CHLORIDE 0.9 % IV SOLN
720.0000 mg | Freq: Once | INTRAVENOUS | Status: AC
  Administered 2020-10-02: 720 mg via INTRAVENOUS
  Filled 2020-10-02: qty 72

## 2020-10-02 MED ORDER — SODIUM CHLORIDE 0.9 % IV SOLN
150.0000 mg | Freq: Once | INTRAVENOUS | Status: AC
  Administered 2020-10-02: 150 mg via INTRAVENOUS
  Filled 2020-10-02: qty 5

## 2020-10-02 MED ORDER — PALONOSETRON HCL INJECTION 0.25 MG/5ML
0.2500 mg | Freq: Once | INTRAVENOUS | Status: AC
  Administered 2020-10-02: 0.25 mg via INTRAVENOUS
  Filled 2020-10-02: qty 5

## 2020-10-02 MED ORDER — HEPARIN SOD (PORK) LOCK FLUSH 100 UNIT/ML IV SOLN
500.0000 [IU] | Freq: Once | INTRAVENOUS | Status: AC
  Administered 2020-10-02: 500 [IU] via INTRAVENOUS
  Filled 2020-10-02: qty 5

## 2020-10-02 MED ORDER — SODIUM CHLORIDE 0.9 % IV SOLN
500.0000 mg/m2 | Freq: Once | INTRAVENOUS | Status: AC
  Administered 2020-10-02: 1000 mg via INTRAVENOUS
  Filled 2020-10-02: qty 40

## 2020-10-02 NOTE — Progress Notes (Signed)
Per Dr. Janese Banks no nuelasta on pro with the treatment.  Pt aware and verbalizes understanding.

## 2020-10-05 ENCOUNTER — Ambulatory Visit: Payer: 59 | Admitting: Physical Therapy

## 2020-10-06 NOTE — Progress Notes (Signed)
Hematology/Oncology Consult note [facility]: [name]  [phone] [phone]  Patient Care Team: Default, Provider, MD as PCP - General Telford Nab, RN as Registered Nurse Sindy Guadeloupe, MD as Consulting Physician (Hematology and Oncology) Sindy Guadeloupe, MD as Consulting Physician (Hematology and Oncology)   Name of the patient: Kyle Guerrero  [id number]  05-May-1971   Date of visit: 10/06/20  Diagnosis- Non-small cell lung cancer stage IV acT2 cN2 cM1 a with pleural involvement  Chief complaint/ Reason for visit-on treatment assessment prior to cycle 3 of carbo [name] chemotherapy  Heme/Onc history: patient is a 49 year old male who presented to the ER with symptoms ofheaviness in his chest and upper left chest discomfort he underwent CT angio chest to rule out PE which showed 3.8 x 3.3 cm left upper palpable lung mass along with 4.4 x 3.3 cm lobulated mass in the aortopulmonary window and 2.6 x 2.4 cm left hilar mass all concerning for malignancy. Patient has also seen pulmonary and has been set up for bronchoscopy and EBUS guided biopsy on 11/23/2019. Patient underwent. PET CT scan which showed a hypermetabolic spiculated 3.5 cm of 5 left upper lobe lung mass, adjacent hypermetabolic 3.2 x 1.2 cm pleural metastases in the medial posterior by the left pleural space along with scalloping of the adjacent posterior left third rib. Hypermetabolic infiltrative left perihilar conglomerate nodal metastases measuring up to 7.3 x 3.6 cm and 0.8 cm high left mediastinal node between the left brachiocephalic vein and left subclavian artery. No evidence of distant metastatic disease  Biopsy showed non-small cell lung cancer but further characterization could not be determined. Insufficient tissue for NGS testing. Repeat biopsy done. Results of NGStestingshowed PD-L1 50%. Tumor mutational burden high. 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  Patient completed concurrent chemoradiation with carbotaxol chemotherapy followed by 2 cycles of carbotaxol Keytruda andwason maintenance Keytruda  Patient was complaining of left shoulder pain and underwent MRI of the shoulder which showed8 in the supraspinatus tendon. He was subsequently seen by [facility] and underwent MRI of the cervical spine which showed a 4.6 x 4.8 x 8.2 cm mass in the left prevertebral region from C2-C7 levels. The mass partially encases the left vertebral artery and abuts the preforaminal segment. Invades the vertebral bodies and edema of the left C5 articular pillar. There is slight extension into the left C4-C5 thecal sac without central thecal sac compression. The mass displaces the left pharynx anteriorly along the left carotid sheath.Patient had a repeat biopsy which was consistent with adenocarcinoma. Patient is undergoing palliative radiation treatment and plan is to proceed with carboplatin and [name] while awaiting repeat NGS testing   Interval history-patient continues to have improvement in his left upper extremity function and is able to abduct his arm overhead and use his left arm more.  He still has some pain in that area for which she has been using as needed oxycodone.  Otherwise tolerating chemotherapy well without any significant side effects  ECOG PS-  Pain scale- 3 Opioid associated constipation- no  Review of systems- Review of Systems  Constitutional: Negative for chills, fever and weight loss.  HENT: Negative for congestion, ear discharge and nosebleeds.   Eyes: Negative for blurred vision.  Respiratory: Negative for cough, hemoptysis, sputum production, shortness of breath and wheezing.   Cardiovascular: Negative for chest pain, palpitations, orthopnea and claudication.  Gastrointestinal: Negative for abdominal pain, blood in stool, constipation, diarrhea, heartburn, melena, nausea  and  vomiting.  Genitourinary: Negative for dysuria, flank pain, frequency, hematuria and urgency.  Musculoskeletal: Negative for back pain, joint pain and myalgias.       Left upper extremity weakness and left shoulder pain  Skin: Negative for rash.  Neurological: Negative for dizziness, tingling, focal weakness, seizures, weakness and headaches.  Endo/Heme/Allergies: Does not bruise/bleed easily.  Psychiatric/Behavioral: Negative for depression and suicidal ideas. The patient does not have insomnia.      No Known Allergies   Past Medical History:  Diagnosis Date  . Asthma   . Lung cancer [facility])      Past Surgical History:  Procedure Laterality Date  . CYST EXCISION    . IR IMAGING GUIDED PORT INSERTION  12/05/2019  . VIDEO BRONCHOSCOPY WITH ENDOBRONCHIAL ULTRASOUND Left 11/22/2019   Procedure: VIDEO BRONCHOSCOPY WITH ENDOBRONCHIAL ULTRASOUND;  Surgeon: Tyler Pita, MD;  Location: [facility] ORS;  Service: Thoracic;  Laterality: Left;    Social History   Socioeconomic History  . Marital status: Single    Spouse name: Not on file  . Number of children: Not on file  . Years of education: Not on file  . Highest education level: Not on file  Occupational History  . Not on file  Tobacco Use  . Smoking status: Former Smoker    Packs/day: 2.00    Types: Cigarettes    Quit date: 11/08/2019    Years since quitting: 0.9  . Smokeless tobacco: Never Used  Vaping Use  . Vaping Use: Never used  Substance and Sexual Activity  . Alcohol use: Not Currently  . Drug use: Yes    Types: Marijuana  . Sexual activity: Not on file  Other Topics Concern  . Not on file  Social History Narrative  . Not on file   Social Determinants of Health   Financial Resource Strain:   . Difficulty of Paying Living Expenses: Not on file  Food Insecurity:   . Worried About Charity fundraiser in the Last Year: Not on file  . Ran Out of Food in the Last Year: Not on file  Transportation Needs:   .  Lack of Transportation (Medical): Not on file  . Lack of Transportation (Non-Medical): Not on file  Physical Activity:   . Days of Exercise per Week: Not on file  . Minutes of Exercise per Session: Not on file  Stress:   . Feeling of Stress : Not on file  Social Connections:   . Frequency of Communication with Friends and Family: Not on file  . Frequency of Social Gatherings with Friends and Family: Not on file  . Attends Religious Services: Not on file  . Active Member of Clubs or Organizations: Not on file  . Attends Archivist Meetings: Not on file  . Marital Status: Not on file  Intimate Partner Violence:   . Fear of Current or Ex-Partner: Not on file  . Emotionally Abused: Not on file  . Physically Abused: Not on file  . Sexually Abused: Not on file    Family History  Problem Relation Age of Onset  . Healthy Mother   . Healthy Father      Current Outpatient Medications:  .  acetaminophen (TYLENOL) 325 MG tablet, Take 650 mg by mouth every 6 (six) hours as needed., Disp: , Rfl:  .  azelastine (ASTELIN) 0.1 % nasal spray, Place 1 spray into the nose as needed. , Disp: , Rfl:  .  folic acid (FOLVITE) 1 MG  tablet, Take 1 tablet (1 mg total) by mouth daily. Start 5-7 days before [name] chemotherapy. Continue until 21 days after [name] completed., Disp: 100 tablet, Rfl: 3 .  lidocaine-prilocaine (EMLA) cream, Apply to affected area once, Disp: 30 g, Rfl: 3 .  oxyCODONE (OXY IR/ROXICODONE) 5 MG immediate release tablet, Take 1 tablet (5 mg total) by mouth every 4 (four) hours as needed for moderate pain or severe pain., Disp: 180 tablet, Rfl: 0 .  sucralfate (CARAFATE) 1 g tablet, Take 1 tablet (1 g total) by mouth 3 (three) times daily before meals. Dissolve in warm water, swish and swallow, Disp: 90 tablet, Rfl: 6 .  dexamethasone (DECADRON) 4 MG tablet, Take 1 tablet (4 mg total) by mouth 2 (two) times daily with a meal. (Patient not taking: Reported on 09/11/2020), Disp:  60 tablet, Rfl: 0 .  ondansetron (ZOFRAN) 8 MG tablet, Take 1 tablet (8 mg total) by mouth 2 (two) times daily as needed (Nausea or vomiting). Start if needed on the third day after chemotherapy. (Patient not taking: Reported on 08/21/2020), Disp: 30 tablet, Rfl: 1 .  prochlorperazine (COMPAZINE) 10 MG tablet, Take 1 tablet (10 mg total) by mouth every 6 (six) hours as needed (Nausea or vomiting). (Patient not taking: Reported on 08/21/2020), Disp: 30 tablet, Rfl: 1 No current facility-administered medications for this visit.  Facility-Administered Medications Ordered in Other Visits:  .  sodium chloride flush (NS) 0.9 % injection 10 mL, 10 mL, Intravenous, PRN, Sindy Guadeloupe, MD, 10 mL at 05/01/20 0900  Physical exam:  Vitals:   10/02/20 0854  BP: 112/78  Pulse: 86  Resp: 20  Temp: 98 F (36.7 C)  SpO2: 100%  Weight: 163 lb 6.4 oz (74.1 kg)   Physical Exam Constitutional:      General: He is not in acute distress. Cardiovascular:     Rate and Rhythm: Normal rate and regular rhythm.     Heart sounds: Normal heart sounds.  Pulmonary:     Effort: Pulmonary effort is normal.     Breath sounds: Normal breath sounds.  Abdominal:     General: Bowel sounds are normal.     Palpations: Abdomen is soft.  Musculoskeletal:     Comments: Neck swelling has resolved.  Improved range of motion noted at the left upper extremity  Skin:    General: Skin is warm and dry.  Neurological:     Mental Status: He is alert and oriented to person, place, and time.      CMP Latest Ref Rng & Units 10/02/2020  Glucose 70 - 99 mg/dL 95  BUN 6 - 20 mg/dL 13  Creatinine 0.61 - 1.24 mg/dL 0.79  Sodium 135 - 145 mmol/L 136  Potassium 3.5 - 5.1 mmol/L 3.7  Chloride 98 - 111 mmol/L 100  CO2 22 - 32 mmol/L 27  Calcium 8.9 - 10.3 mg/dL 8.8(L)  Total Protein 6.5 - 8.1 g/dL 7.3  Total Bilirubin 0.3 - 1.2 mg/dL 0.5  Alkaline Phos 38 - 126 U/L 102  AST 15 - 41 U/L 24  ALT 0 - 44 U/L 34   CBC Latest Ref Rng  & Units 10/02/2020  WBC 4.0 - 10.5 K/uL 7.3  Hemoglobin 13.0 - 17.0 g/dL 10.6(L)  Hematocrit 39 - 52 % 32.1(L)  Platelets 150 - 400 K/uL 432(H)    Assessment and plan- Patient is a 48 y.o. male with stage IV adenocarcinoma of the lung with pleural metastases.He now presents with a large neck mass  with biopsy consistent with previously diagnosed non-small cell lung cancer.    He is here for on treatment assessment prior to cycle 3 of carbo [name] chemotherapy  Counts okay to proceed with cycle 3 of carbo [name] chemotherapy today.  We are not able to give him Neulasta at this time as it was shipped to the cancer center and we cannot use outside drugs.  I will therefore be giving him chemo without Neulasta this time.  I will see him back in 3 weeks for cycle 4 of carbo [name] chemotherapy followed by repeat scans.  If PET scan shows good response to treatment I will continue maintenance [name] at that time  Clinically patient has responded well with regression of the neck mass as well as improvement in his left upper extremity function.  He continues to go for physical therapy.  I am renewing his as needed oxycodone today.   Visit Diagnosis 1. Malignant neoplasm of lung, unspecified laterality, unspecified part of lung ([place])   2. Encounter for antineoplastic chemotherapy   3. Neoplasm related pain      Dr. Randa Evens, MD, MPH [facility] at [facility] [phone] 10/06/2020 10:00 AM

## 2020-10-07 ENCOUNTER — Encounter: Payer: Self-pay | Admitting: Radiation Oncology

## 2020-10-07 ENCOUNTER — Encounter: Payer: Self-pay | Admitting: *Deleted

## 2020-10-07 ENCOUNTER — Other Ambulatory Visit: Payer: Self-pay

## 2020-10-07 ENCOUNTER — Ambulatory Visit
Admission: RE | Admit: 2020-10-07 | Discharge: 2020-10-07 | Disposition: A | Discharge status: Home / Self Care | Payer: 59 | Source: Ambulatory Visit | Attending: Radiation Oncology | Admitting: Radiation Oncology

## 2020-10-07 VITALS — BP 115/76 | HR 88 | Temp 95.6°F | Wt 160.0 lb

## 2020-10-07 DIAGNOSIS — Z923 Personal history of irradiation: Secondary | ICD-10-CM | POA: Insufficient documentation

## 2020-10-07 DIAGNOSIS — C770 Secondary and unspecified malignant neoplasm of lymph nodes of head, face and neck: Secondary | ICD-10-CM | POA: Diagnosis present

## 2020-10-07 DIAGNOSIS — C3412 Malignant neoplasm of upper lobe, left bronchus or lung: Secondary | ICD-10-CM | POA: Diagnosis not present

## 2020-10-07 NOTE — Progress Notes (Signed)
Radiation Oncology Follow up Note  Name: Kyle Guerrero   Date:   10/07/2020 MRN:  [id number] DOB: Sep 21, 1971    This 49 y.o. male presents to the clinic today for 1 month follow-up status post palliative radiation therapy.  To left neck mass and patient with known stage IV non-small cell lung cancer with limitation of motor function in his left upper extremity  REFERRING PROVIDER: No ref. provider found  HPI: Patient is a 49 year old male now at 1 month having completed palliative radiation therapy to his a left neck mass causing significant limitation of motion and motor function in his left upper extremity seen today in routine follow-up he is doing well he has almost complete range of motion of his left upper extremity he is working on the strength at this time.  He still has some occasional neck pain no other serious pain issues.  Unfortunately he recently lost his mom.Marland Kitchen  He is currently receiving palliative carbo [name] through medical oncology.  COMPLICATIONS OF TREATMENT: none  FOLLOW UP COMPLIANCE: keeps appointments   PHYSICAL EXAM:  BP 115/76   Pulse 88   Temp (!) 95.6 F (35.3 C) (Tympanic)   Wt 160 lb (72.6 kg)   BMI 22.32 kg/m  Range of motion of his left upper extremity does not elicit pain he has good motor function in 6 symmetry in both upper extremities no focal neurologic deficit is noted.  Well-developed well-nourished patient in NAD. HEENT reveals PERLA, EOMI, discs not visualized.  Oral cavity is clear. No oral mucosal lesions are identified. Neck is clear without evidence of cervical or supraclavicular adenopathy. Lungs are clear to A&P. Cardiac examination is essentially unremarkable with regular rate and rhythm without murmur rub or thrill. Abdomen is benign with no organomegaly or masses noted. Motor sensory and DTR levels are equal and symmetric in the upper and lower extremities. Cranial nerves II through XII are grossly intact. Proprioception is intact. No  peripheral adenopathy or edema is identified. No motor or sensory levels are noted. Crude visual fields are within normal range.  RADIOLOGY RESULTS: No current films to review  PLAN: Present time patient is received excellent palliation and return of motor function to his left upper extremity.  I am pleased with his overall progress.  I am turning follow-up care over to medical oncology for continuation of maintenance palliative chemotherapy.  Would be happy to reevaluate the patient anytime should further treatment be indicated.  I would like to take this opportunity to thank you for allowing me to participate in the care of your patient.Noreene Filbert, MD

## 2020-10-09 ENCOUNTER — Ambulatory Visit: Payer: 59 | Admitting: Physical Therapy

## 2020-10-12 ENCOUNTER — Encounter: Payer: Self-pay | Admitting: Physical Therapy

## 2020-10-12 ENCOUNTER — Other Ambulatory Visit: Payer: Self-pay

## 2020-10-12 ENCOUNTER — Ambulatory Visit: Payer: 59 | Admitting: Physical Therapy

## 2020-10-12 DIAGNOSIS — M25512 Pain in left shoulder: Secondary | ICD-10-CM

## 2020-10-12 DIAGNOSIS — R293 Abnormal posture: Secondary | ICD-10-CM

## 2020-10-12 DIAGNOSIS — M25612 Stiffness of left shoulder, not elsewhere classified: Secondary | ICD-10-CM

## 2020-10-12 NOTE — Therapy (Signed)
[facility] [address], [place], [address] Phone: [phone]   Fax:  [phone]  Physical Therapy Treatment  Patient Details  Name: Kyle Guerrero MRN: [id number] Date of Birth: 05-02-1971 No data recorded  Encounter Date: 10/12/2020   PT End of Session - 10/12/20 0745    Visit Number 3    Number of Visits 17    Date for PT Re-Evaluation 11/27/20    PT Start Time 0730    PT Stop Time 0815    PT Time Calculation (min) 45 min    Activity Tolerance Patient tolerated treatment well    Behavior During Therapy [facility] for tasks assessed/performed           Past Medical History:  Diagnosis Date   Asthma    Lung cancer ([place])     Past Surgical History:  Procedure Laterality Date   CYST EXCISION     IR IMAGING GUIDED PORT INSERTION  12/05/2019   VIDEO BRONCHOSCOPY WITH ENDOBRONCHIAL ULTRASOUND Left 11/22/2019   Procedure: VIDEO BRONCHOSCOPY WITH ENDOBRONCHIAL ULTRASOUND;  Surgeon: Tyler Pita, MD;  Location: [facility] ORS;  Service: Thoracic;  Laterality: Left;    There were no vitals filed for this visit.   Subjective Assessment - 10/12/20 0738    Subjective Pt reports he is feeling well today, no pain. Reports decreased compliance with HEP d/t the death of his mother last week. He has good family support in the area, ad reports he is doing "okay".    Pertinent History Patient with c/o L shoulder pain and decreased function for multiple months prior to MRI of the shoulder/neck revealing mass partially encasing the left vertebral artery and abutting to the preforaminal segment; invading the vertebral bodies and edema of the left C5 articular pillar. Underwent radiation finishing on 09/04/20, with regaining of LUE function since. Currently undergoing chemo Infusion 1x every 3 weeks. Pertinent PMH of non-small cell lung cancer, in remission since summer 2021 following concurrent chemoradiation with  carbotaxol chemotherapy followed by 2 cycles of carbotaxol Keytruda and was on maintenance Keytruda. Reports initially he did not have any function in LUE and has been working on moving his LUE more. Reports he has some tingling in palmar side thumb, pointer finger, and middle finger; but reports normal sensations to hot/cold, sharp/dull, etc. Continues to have pain along medial border of L scapula that is aching in nature, that is always an 6/10, gets up to 8/10 with heavy lifting and when pressure is applied to ie (ie sleeping). 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 Patient enjoyed shooting pool and playing basketball prior to diagnosis. Patient is L handed and reports he cannot reach overhead with anything of weight, can bathe and groom hair at this point. Pt denies N/V, B&B changes, unexplained weight fluctuation, saddle paresthesia, fever, night sweats, or unrelenting night pain at this time.    Limitations House hold activities;Lifting    How long can you sit comfortably? no time    How long can you stand comfortably? no time    How long can you walk comfortably? no time    Patient Stated Goals Regain strength in L arm           Ther-Ex - Arm bike L9 43mins FWD 2ins BWD gentle strengthening  - Total gym pull up L11 x10; L12 x10; L13  x10 with min cuing for scapulo-humeral rhythm with good carry over - Scaption 1# DB x10 with scapular assistance; WB only x10 with decent carry over of prior cuing for scapulohmeral rhythm - Serratus wall slides x12 with good decent carry over of previous cuing, min TC needed - Lat pulldown 20# x10; 25# x10; 30# x10 with fatigue inhibiting prevention of shoulder hiking on final reps - OMEGA chest press 20# 3x 10 with cuing for scapular retraction with good carry over  - sidelying ER with 2# DB x8 1# DB 2x 10 with better carry over of technique with 1#  DB              PT Education - 10/12/20 0740    Education Details therex form; technique    Person(s) Educated Patient    Methods Explanation;Demonstration;Verbal cues    Comprehension Verbalized understanding;Returned demonstration;Verbal cues required            PT Short Term Goals - 09/30/20 1153      PT SHORT TERM GOAL #1   Title Pt will be independent with HEP in order to improve strength and decrease pain in order to improve pain-free function at home and work.    Baseline 09/29/20 HEP given    Time 4    Period Weeks    Status New             PT Long Term Goals - 09/30/20 1153      PT LONG TERM GOAL #1   Title Patient will increase FOTO score to 63 to demonstrate predicted increase in functional mobility to complete ADLs    Baseline 09/29/20 48    Time 8    Period Weeks    Status New      PT LONG TERM GOAL #2   Title Pt will decrease worst pain as reported on NPRS by at least 3 points in order to demonstrate clinically significant reduction in pain.    Baseline 09/29/20 8/10    Time 8    Period Weeks    Status New      PT LONG TERM GOAL #3   Title Patient will demonstrate at least 4+/5 gross L shoulder and periscapular strength to demonstrate symmetrical PLOF and strength needed for heavy household chores    Baseline 09/29/20 L shoulder flex: 3+/5 ABD 3+/5 ER 3/5 IR 3+/5 Y 3-/5 T 3/5    Time 8    Period Weeks    Status New                 Plan - 10/12/20 6073    Clinical Impression Statement PT continued progression for increased L shoulder, and periscapular strength and motor control with success. Pt is able to comply with cuing for technique and scapulohumeral rhthym with decent carry over of, requiring multimodal cuing for carry over. PT will continue progression as able.    Personal Factors and Comorbidities Comorbidity 1;Fitness;Comorbidity 2;Profession;Time since onset of injury/illness/exacerbation;Past/Current Experience     Comorbidities asthma, non small cell lung cancer    Examination-Activity Limitations Bathing;Reach Overhead;Lift;Sleep;Carry;Bed Mobility    Examination-Participation Restrictions Occupation;Community Activity;Meal Prep;Laundry;Cleaning    Stability/Clinical Decision Making Evolving/Moderate complexity    Clinical Decision Making Moderate    Rehab Potential Fair    PT Frequency 2x / week    PT Duration 8 weeks    PT Treatment/Interventions ADLs/Self Care Home Management;Cryotherapy;Iontophoresis 4mg /ml Dexamethasone;Functional mobility training;Patient/family education;Manual techniques;Joint Manipulations;Spinal Manipulations;Dry needling;Manual lymph drainage;Taping;Therapeutic exercise;Gait training;Traction;Ultrasound;Electrical Stimulation;Moist Heat;DME Instruction;Therapeutic activities;Neuromuscular  re-education    PT Next Visit Plan CKC, overhead mobility and strengthening    PT Home Exercise Plan rows, high rows, Prone T    Consulted and Agree with Plan of Care Patient           Patient will benefit from skilled therapeutic intervention in order to improve the following deficits and impairments:  Decreased mobility, Increased muscle spasms, Impaired sensation, Improper body mechanics, Impaired tone, Decreased range of motion, Decreased strength, Increased fascial restricitons, Impaired flexibility, Impaired UE functional use, Postural dysfunction, Pain, Decreased activity tolerance  Visit Diagnosis: Left shoulder pain, unspecified chronicity  Stiffness of left shoulder, not elsewhere classified  Abnormal posture     Problem List Patient Active Problem List   Diagnosis Date Noted   Goals of care, counseling/discussion 11/29/2019   Malignant neoplasm of upper lobe of left lung ([place]) 11/29/2019   Non-small cell cancer of left lung ([place]) 11/29/2019   Hemoptysis 11/29/2019   Durwin Reges DPT Durwin Reges 10/12/2020, 8:11 AM  [facility] [address] [place], [address] Phone: [phone]   Fax:  [phone]  Name: TUVIA WOODRICK MRN: [id number] Date of Birth: 1971/08/30

## 2020-10-14 ENCOUNTER — Ambulatory Visit: Payer: 59 | Admitting: Physical Therapy

## 2020-10-14 ENCOUNTER — Other Ambulatory Visit: Payer: Self-pay

## 2020-10-14 ENCOUNTER — Encounter: Payer: Self-pay | Admitting: Physical Therapy

## 2020-10-14 DIAGNOSIS — M25512 Pain in left shoulder: Secondary | ICD-10-CM | POA: Diagnosis not present

## 2020-10-14 DIAGNOSIS — M25612 Stiffness of left shoulder, not elsewhere classified: Secondary | ICD-10-CM

## 2020-10-14 DIAGNOSIS — R293 Abnormal posture: Secondary | ICD-10-CM

## 2020-10-14 NOTE — Therapy (Signed)
[facility] [address] [place], [address] Phone: [phone]   Fax:  [phone]  Physical Therapy Treatment  Patient Details  Name: Kyle Guerrero MRN: [id number] Date of Birth: 1971/09/12 No data recorded  Encounter Date: 10/14/2020   PT End of Session - 10/14/20 1037    Visit Number 4    Number of Visits 17    Date for PT Re-Evaluation 11/27/20    PT Start Time 1033    PT Stop Time 1113    PT Time Calculation (min) 40 min    Activity Tolerance Patient tolerated treatment well    Behavior During Therapy [facility] for tasks assessed/performed           Past Medical History:  Diagnosis Date  . Asthma   . Lung cancer [facility])     Past Surgical History:  Procedure Laterality Date  . CYST EXCISION    . IR IMAGING GUIDED PORT INSERTION  12/05/2019  . VIDEO BRONCHOSCOPY WITH ENDOBRONCHIAL ULTRASOUND Left 11/22/2019   Procedure: VIDEO BRONCHOSCOPY WITH ENDOBRONCHIAL ULTRASOUND;  Surgeon: Tyler Pita, MD;  Location: [facility] ORS;  Service: Thoracic;  Laterality: Left;    There were no vitals filed for this visit.   Subjective Assessment - 10/14/20 1035    Subjective Pt reports he used his total gym some and has been able to complete a push up from knees which he is happy with. No pain today, just soreness.    Pertinent History Patient with c/o L shoulder pain and decreased function for multiple months prior to MRI of the shoulder/neck revealing mass partially encasing the left vertebral artery and abutting to the preforaminal segment; invading the vertebral bodies and edema of the left C5 articular pillar. Underwent radiation finishing on 09/04/20, with regaining of LUE function since. Currently undergoing chemo Infusion 1x every 3 weeks. Pertinent PMH of non-small cell lung cancer, in remission since summer 2021 following concurrent chemoradiation with carbotaxol chemotherapy followed by 2 cycles of  carbotaxol Keytruda and was on maintenance Keytruda. Reports initially he did not have any function in LUE and has been working on moving his LUE more. Reports he has some tingling in palmar side thumb, pointer finger, and middle finger; but reports normal sensations to hot/cold, sharp/dull, etc. Continues to have pain along medial border of L scapula that is aching in nature, that is always an 6/10, gets up to 8/10 with heavy lifting and when pressure is applied to ie (ie sleeping). 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 Patient enjoyed shooting pool and playing basketball prior to diagnosis. Patient is L handed and reports he cannot reach overhead with anything of weight, can bathe and groom hair at this point. Pt denies N/V, B&B changes, unexplained weight fluctuation, saddle paresthesia, fever, night sweats, or unrelenting night pain at this time.    Limitations House hold activities;Lifting    How long can you sit comfortably? no time    How long can you stand comfortably? no time    How long can you walk comfortably? no time    Patient Stated Goals Regain strength in L arm           Ther-Ex - Arm bike L9>8 68mins FWD 2ins BWD gentle strengthening  - Total gym pull up  L13 3 x10/9/8 with min cuing for scapulo-humeral rhythm with good  carry over - OMEGA chest press 35# 3x 10 with cuing for scapular retraction with good carry over  - Elevated push up from 2.54ft table 3x 8/7/6 with good carry over of demo and cuing for equal push and core stabilization - Scaption with scapular assistance x12 1# 2x 10 with max cuing to prevent shoulder hiking with decent carry over Bent over rows 10# x8; 7# 2x 10 with good carry over of demo and cuing with 7# to prevent rotation compensation                           PT Education - 10/14/20 1037    Education Details therex form/technique     Person(s) Educated Patient    Methods Explanation;Demonstration;Verbal cues    Comprehension Verbalized understanding;Returned demonstration;Verbal cues required            PT Short Term Goals - 09/30/20 1153      PT SHORT TERM GOAL #1   Title Pt will be independent with HEP in order to improve strength and decrease pain in order to improve pain-free function at home and work.    Baseline 09/29/20 HEP given    Time 4    Period Weeks    Status New             PT Long Term Goals - 09/30/20 1153      PT LONG TERM GOAL #1   Title Patient will increase FOTO score to 63 to demonstrate predicted increase in functional mobility to complete ADLs    Baseline 09/29/20 48    Time 8    Period Weeks    Status New      PT LONG TERM GOAL #2   Title Pt will decrease worst pain as reported on NPRS by at least 3 points in order to demonstrate clinically significant reduction in pain.    Baseline 09/29/20 8/10    Time 8    Period Weeks    Status New      PT LONG TERM GOAL #3   Title Patient will demonstrate at least 4+/5 gross L shoulder and periscapular strength to demonstrate symmetrical PLOF and strength needed for heavy household chores    Baseline 09/29/20 L shoulder flex: 3+/5 ABD 3+/5 ER 3/5 IR 3+/5 Y 3-/5 T 3/5    Time 8    Period Weeks    Status New                 Plan - 10/14/20 1105    Clinical Impression Statement PT continued therex progression for increased L shoulder/periscapular strength and scapulohumeral rhythm with success. Patient requires multimodal cuing for proper technique of therex without compensation, but is able to comply with all cuing and is motivated throughout session. PT will continue progression as able.    Personal Factors and Comorbidities Comorbidity 1;Fitness;Comorbidity 2;Profession;Time since onset of injury/illness/exacerbation;Past/Current Experience    Comorbidities asthma, non small cell lung cancer    Examination-Activity Limitations  Bathing;Reach Overhead;Lift;Sleep;Carry;Bed Mobility    Examination-Participation Restrictions Occupation;Community Activity;Meal Prep;Laundry;Cleaning    Stability/Clinical Decision Making Evolving/Moderate complexity    Clinical Decision Making Moderate    Rehab Potential Fair    PT Frequency 2x / week    PT Duration 8 weeks    PT Treatment/Interventions ADLs/Self Care Home Management;Cryotherapy;Iontophoresis 4mg /ml Dexamethasone;Functional mobility training;Patient/family education;Manual techniques;Joint Manipulations;Spinal Manipulations;Dry needling;Manual lymph drainage;Taping;Therapeutic exercise;Gait training;Traction;Ultrasound;Electrical Stimulation;Moist Heat;DME Instruction;Therapeutic activities;Neuromuscular re-education    PT Next Visit Plan  CKC, overhead mobility and strengthening    PT Home Exercise Plan rows, high rows, Prone T    Consulted and Agree with Plan of Care Patient           Patient will benefit from skilled therapeutic intervention in order to improve the following deficits and impairments:  Decreased mobility, Increased muscle spasms, Impaired sensation, Improper body mechanics, Impaired tone, Decreased range of motion, Decreased strength, Increased fascial restricitons, Impaired flexibility, Impaired UE functional use, Postural dysfunction, Pain, Decreased activity tolerance  Visit Diagnosis: Left shoulder pain, unspecified chronicity  Stiffness of left shoulder, not elsewhere classified  Abnormal posture     Problem List Patient Active Problem List   Diagnosis Date Noted  . Goals of care, counseling/discussion 11/29/2019  . Malignant neoplasm of upper lobe of [place] lung ([place]) 11/29/2019  . Non-small cell cancer of left lung ([place]) 11/29/2019  . Hemoptysis 11/29/2019   Durwin Reges DPT  Durwin Reges 10/14/2020, 11:21 AM  [facility] [address] [place],  [address] Phone: [phone]   Fax:  [phone]  Name: Kyle Guerrero MRN: [id number] Date of Birth: 1971-04-01

## 2020-10-16 ENCOUNTER — Other Ambulatory Visit: Payer: 59

## 2020-10-16 ENCOUNTER — Ambulatory Visit: Payer: 59

## 2020-10-16 ENCOUNTER — Ambulatory Visit: Payer: 59 | Admitting: Oncology

## 2020-10-19 ENCOUNTER — Ambulatory Visit: Payer: 59 | Admitting: Physical Therapy

## 2020-10-19 ENCOUNTER — Encounter: Payer: Self-pay | Admitting: Physical Therapy

## 2020-10-19 ENCOUNTER — Other Ambulatory Visit: Payer: Self-pay

## 2020-10-19 DIAGNOSIS — M25512 Pain in left shoulder: Secondary | ICD-10-CM | POA: Diagnosis not present

## 2020-10-19 DIAGNOSIS — R293 Abnormal posture: Secondary | ICD-10-CM

## 2020-10-19 DIAGNOSIS — M25612 Stiffness of left shoulder, not elsewhere classified: Secondary | ICD-10-CM

## 2020-10-19 NOTE — Therapy (Signed)
[facility] [address] [place], [address] Phone: [phone]   Fax:  [phone]  Physical Therapy Treatment  Patient Details  Name: Kyle Guerrero MRN: [id number] Date of Birth: [birth year] No data recorded  Encounter Date: 10/19/2020   PT End of Session - 10/19/20 0746    Visit Number 5    Number of Visits 17    Date for PT Re-Evaluation 11/27/20    PT Start Time 0735    PT Stop Time 0813    PT Time Calculation (min) 38 min    Activity Tolerance Patient tolerated treatment well    Behavior During Therapy [facility] for tasks assessed/performed           Past Medical History:  Diagnosis Date  . Asthma   . Lung cancer [facility])     Past Surgical History:  Procedure Laterality Date  . CYST EXCISION    . IR IMAGING GUIDED PORT INSERTION  12/05/2019  . VIDEO BRONCHOSCOPY WITH ENDOBRONCHIAL ULTRASOUND Left 11/22/2019   Procedure: VIDEO BRONCHOSCOPY WITH ENDOBRONCHIAL ULTRASOUND;  Surgeon: Tyler Pita, MD;  Location: [facility] ORS;  Service: Thoracic;  Laterality: Left;    There were no vitals filed for this visit.   Subjective Assessment - 10/19/20 0743    Subjective Patient reports he has been sore following last PT session, 5/10 pain but mostly "soreness".    Pertinent History Patient with c/o L shoulder pain and decreased function for multiple months prior to MRI of the shoulder/neck revealing mass partially encasing the left vertebral artery and abutting to the preforaminal segment; invading the vertebral bodies and edema of the left C5 articular pillar. Underwent radiation finishing on 09/04/20, with regaining of LUE function since. Currently undergoing chemo Infusion 1x every 3 weeks. Pertinent PMH of non-small cell lung cancer, in remission since summer 2021 following concurrent chemoradiation with carbotaxol chemotherapy followed by 2 cycles of carbotaxol Keytruda and was on maintenance Keytruda.  Reports initially he did not have any function in LUE and has been working on moving his LUE more. Reports he has some tingling in palmar side thumb, pointer finger, and middle finger; but reports normal sensations to hot/cold, sharp/dull, etc. Continues to have pain along medial border of L scapula that is aching in nature, that is always an 6/10, gets up to 8/10 with heavy lifting and when pressure is applied to ie (ie sleeping). 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 Patient enjoyed shooting pool and playing basketball prior to diagnosis. Patient is L handed and reports he cannot reach overhead with anything of weight, can bathe and groom hair at this point. Pt denies N/V, B&B changes, unexplained weight fluctuation, saddle paresthesia, fever, night sweats, or unrelenting night pain at this time.    Limitations House hold activities;Lifting    How long can you sit comfortably? no time    How long can you stand comfortably? no time    How long can you walk comfortably? no time    Patient Stated Goals Regain strength in L arm    Pain Onset More than a month ago             Ther-Ex - Arm bike L9>8 58mins FWD 2ins BWDgentle strengthening  - Total gym pull up  L13 x10; L14 x8; L15 x6 - OMEGA chest press 35# 3x 10 with  cuing for scapular retraction with good carry over - Elevated push up from 2.17ft table 3x 8 with min cuing for even push through each UE  - Scaption x 10 2# x10; without wt at wall for TC x12; with 1# x10  - Y on wall 2x 12 with max cuing initially for scapulohumeral rhythm with good carry over  Bent over rows 7# 3x 10 with cuing needed for set up with good carry over                         PT Education - 10/19/20 0745    Education Details therex form/tehnique    Person(s) Educated Patient    Methods Explanation;Demonstration;Verbal cues     Comprehension Verbalized understanding;Returned demonstration;Verbal cues required            PT Short Term Goals - 09/30/20 1153      PT SHORT TERM GOAL #1   Title Pt will be independent with HEP in order to improve strength and decrease pain in order to improve pain-free function at home and work.    Baseline 09/29/20 HEP given    Time 4    Period Weeks    Status New             PT Long Term Goals - 09/30/20 1153      PT LONG TERM GOAL #1   Title Patient will increase FOTO score to 63 to demonstrate predicted increase in functional mobility to complete ADLs    Baseline 09/29/20 48    Time 8    Period Weeks    Status New      PT LONG TERM GOAL #2   Title Pt will decrease worst pain as reported on NPRS by at least 3 points in order to demonstrate clinically significant reduction in pain.    Baseline 09/29/20 8/10    Time 8    Period Weeks    Status New      PT LONG TERM GOAL #3   Title Patient will demonstrate at least 4+/5 gross L shoulder and periscapular strength to demonstrate symmetrical PLOF and strength needed for heavy household chores    Baseline 09/29/20 L shoulder flex: 3+/5 ABD 3+/5 ER 3/5 IR 3+/5 Y 3-/5 T 3/5    Time 8    Period Weeks    Status New                 Plan - 10/19/20 9629    Clinical Impression Statement PT continued therex progression for increased L shoulder/periscapular strength and motor control with success. Patient is motivated throughout session and able to complete all planned therex with proper technique following cuing. Patient with diffiuclty with scapulohumeral rhythm in overhead motion, but is able to improve with with cuing. Education on motor control deficits vs. strength with verbalized understanding.    Personal Factors and Comorbidities Comorbidity 1;Fitness;Comorbidity 2;Profession;Time since onset of injury/illness/exacerbation;Past/Current Experience    Comorbidities asthma, non small cell lung cancer     Examination-Activity Limitations Bathing;Reach Overhead;Lift;Sleep;Carry;Bed Mobility    Examination-Participation Restrictions Occupation;Community Activity;Meal Prep;Laundry;Cleaning    Stability/Clinical Decision Making Evolving/Moderate complexity    Clinical Decision Making Moderate    Rehab Potential Fair    PT Frequency 2x / week    PT Duration 8 weeks    PT Treatment/Interventions ADLs/Self Care Home Management;Cryotherapy;Iontophoresis 4mg /ml Dexamethasone;Functional mobility training;Patient/family education;Manual techniques;Joint Manipulations;Spinal Manipulations;Dry needling;Manual lymph drainage;Taping;Therapeutic exercise;Gait training;Traction;Ultrasound;Electrical Stimulation;Moist Heat;DME Instruction;Therapeutic activities;Neuromuscular re-education  PT Next Visit Plan CKC, overhead mobility and strengthening    PT Home Exercise Plan rows, high rows, Prone T    Consulted and Agree with Plan of Care Patient           Patient will benefit from skilled therapeutic intervention in order to improve the following deficits and impairments:  Decreased mobility, Increased muscle spasms, Impaired sensation, Improper body mechanics, Impaired tone, Decreased range of motion, Decreased strength, Increased fascial restricitons, Impaired flexibility, Impaired UE functional use, Postural dysfunction, Pain, Decreased activity tolerance  Visit Diagnosis: Left shoulder pain, unspecified chronicity  Stiffness of left shoulder, not elsewhere classified  Abnormal posture     Problem List Patient Active Problem List   Diagnosis Date Noted  . Goals of care, counseling/discussion 11/29/2019  . Malignant neoplasm of upper lobe of left lung ([place]) 11/29/2019  . Non-small cell cancer of left lung ([place]) 11/29/2019  . Hemoptysis 11/29/2019   Durwin Reges DPT Durwin Reges 10/19/2020, 9:03 AM  [facility] [address] [place], [address] Phone: [phone]   Fax:  [phone]  Name: NIMROD WENDT MRN: [id number] Date of Birth: 07/16/1971

## 2020-10-21 ENCOUNTER — Ambulatory Visit: Payer: 59 | Admitting: Physical Therapy

## 2020-10-21 ENCOUNTER — Other Ambulatory Visit: Payer: Self-pay

## 2020-10-21 ENCOUNTER — Encounter: Payer: Self-pay | Admitting: Physical Therapy

## 2020-10-21 DIAGNOSIS — R293 Abnormal posture: Secondary | ICD-10-CM

## 2020-10-21 DIAGNOSIS — M25512 Pain in left shoulder: Secondary | ICD-10-CM

## 2020-10-21 DIAGNOSIS — M25612 Stiffness of left shoulder, not elsewhere classified: Secondary | ICD-10-CM

## 2020-10-21 NOTE — Therapy (Signed)
[facility] [address] [place], [address] Phone: [phone]   Fax:  [phone]  Physical Therapy Treatment  Patient Details  Name: Kyle Guerrero MRN: [id number] Date of Birth: 07/18/1971 No data recorded  Encounter Date: 10/21/2020   PT End of Session - 10/21/20 0956    Visit Number 6    Number of Visits 17    Date for PT Re-Evaluation 11/27/20    PT Start Time 0949    PT Stop Time 1027    PT Time Calculation (min) 38 min    Activity Tolerance Patient tolerated treatment well    Behavior During Therapy [facility] for tasks assessed/performed           Past Medical History:  Diagnosis Date  . Asthma   . Lung cancer [facility])     Past Surgical History:  Procedure Laterality Date  . CYST EXCISION    . IR IMAGING GUIDED PORT INSERTION  12/05/2019  . VIDEO BRONCHOSCOPY WITH ENDOBRONCHIAL ULTRASOUND Left 11/22/2019   Procedure: VIDEO BRONCHOSCOPY WITH ENDOBRONCHIAL ULTRASOUND;  Surgeon: Tyler Pita, MD;  Location: [facility] ORS;  Service: Thoracic;  Laterality: Left;    There were no vitals filed for this visit.   Subjective Assessment - 10/21/20 0951    Subjective Patient reports soreness following last PT session. Reports compliance with HEP, no pain today, just soreness.    Pertinent History Patient with c/o L shoulder pain and decreased function for multiple months prior to MRI of the shoulder/neck revealing mass partially encasing the left vertebral artery and abutting to the preforaminal segment; invading the vertebral bodies and edema of the left C5 articular pillar. Underwent radiation finishing on 09/04/20, with regaining of LUE function since. Currently undergoing chemo Infusion 1x every 3 weeks. Pertinent PMH of non-small cell lung cancer, in remission since summer 2021 following concurrent chemoradiation with carbotaxol chemotherapy followed by 2 cycles of carbotaxol Keytruda and was on  maintenance Keytruda. Reports initially he did not have any function in LUE and has been working on moving his LUE more. Reports he has some tingling in palmar side thumb, pointer finger, and middle finger; but reports normal sensations to hot/cold, sharp/dull, etc. Continues to have pain along medial border of L scapula that is aching in nature, that is always an 6/10, gets up to 8/10 with heavy lifting and when pressure is applied to ie (ie sleeping). 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 Patient enjoyed shooting pool and playing basketball prior to diagnosis. Patient is L handed and reports he cannot reach overhead with anything of weight, can bathe and groom hair at this point. Pt denies N/V, B&B changes, unexplained weight fluctuation, saddle paresthesia, fever, night sweats, or unrelenting night pain at this time.    How long can you sit comfortably? no time    How long can you stand comfortably? no time    How long can you walk comfortably? no time    Patient Stated Goals Regain strength in L arm    Pain Onset More than a month ago           Ther-Ex - Arm bike L9>8 9mins FWD 2ins BWDgentle strengthening  - Total gym pull up L14 x10; L15 x8; L16 x7 - Elevated push up from 2.36ft table 3x 10/9/8 with min cuing for even push through each  UE - Prone Y with LUE off table 3x 12 with cuing for scapular motion with decent carry over  - Scaption 1# 3x 10 with scapula against wall with better carry over of technique this way -                            PT Education - 10/21/20 0955    Education Details therex form/technique    Person(s) Educated Patient    Methods Explanation;Demonstration;Verbal cues    Comprehension Verbalized understanding;Returned demonstration;Verbal cues required            PT Short Term Goals - 09/30/20 1153      PT SHORT TERM GOAL #1    Title Pt will be independent with HEP in order to improve strength and decrease pain in order to improve pain-free function at home and work.    Baseline 09/29/20 HEP given    Time 4    Period Weeks    Status New             PT Long Term Goals - 09/30/20 1153      PT LONG TERM GOAL #1   Title Patient will increase FOTO score to 63 to demonstrate predicted increase in functional mobility to complete ADLs    Baseline 09/29/20 48    Time 8    Period Weeks    Status New      PT LONG TERM GOAL #2   Title Pt will decrease worst pain as reported on NPRS by at least 3 points in order to demonstrate clinically significant reduction in pain.    Baseline 09/29/20 8/10    Time 8    Period Weeks    Status New      PT LONG TERM GOAL #3   Title Patient will demonstrate at least 4+/5 gross L shoulder and periscapular strength to demonstrate symmetrical PLOF and strength needed for heavy household chores    Baseline 09/29/20 L shoulder flex: 3+/5 ABD 3+/5 ER 3/5 IR 3+/5 Y 3-/5 T 3/5    Time 8    Period Weeks    Status New                 Plan - 10/21/20 1006    Clinical Impression Statement PT continued therex progression for increased L shoulder/periscapular strength and motor control with success. Patient with better compliance of scapulohumeral rhythm with TC of scapula against wall. Pt is making visable improvements in effiencent overhead mobility with good scapulohumeral rhythm. PT will continue progression as able.    Personal Factors and Comorbidities Comorbidity 1;Fitness;Comorbidity 2;Profession;Time since onset of injury/illness/exacerbation;Past/Current Experience    Comorbidities asthma, non small cell lung cancer    Examination-Activity Limitations Bathing;Reach Overhead;Lift;Sleep;Carry;Bed Mobility    Examination-Participation Restrictions Occupation;Community Activity;Meal Prep;Laundry;Cleaning    Stability/Clinical Decision Making Evolving/Moderate complexity    Clinical  Decision Making Moderate    Rehab Potential Fair    PT Frequency 2x / week    PT Duration 8 weeks    PT Treatment/Interventions ADLs/Self Care Home Management;Cryotherapy;Iontophoresis 4mg /ml Dexamethasone;Functional mobility training;Patient/family education;Manual techniques;Joint Manipulations;Spinal Manipulations;Dry needling;Manual lymph drainage;Taping;Therapeutic exercise;Gait training;Traction;Ultrasound;Electrical Stimulation;Moist Heat;DME Instruction;Therapeutic activities;Neuromuscular re-education    PT Next Visit Plan CKC, overhead mobility and strengthening    PT Home Exercise Plan rows, high rows, Prone T    Consulted and Agree with Plan of Care Patient           Patient will benefit from skilled  therapeutic intervention in order to improve the following deficits and impairments:  Decreased mobility, Increased muscle spasms, Impaired sensation, Improper body mechanics, Impaired tone, Decreased range of motion, Decreased strength, Increased fascial restricitons, Impaired flexibility, Impaired UE functional use, Postural dysfunction, Pain, Decreased activity tolerance  Visit Diagnosis: Left shoulder pain, unspecified chronicity  Stiffness of left shoulder, not elsewhere classified  Abnormal posture     Problem List Patient Active Problem List   Diagnosis Date Noted  . Goals of care, counseling/discussion 11/29/2019  . Malignant neoplasm of upper lobe of left lung ([place]) 11/29/2019  . Non-small cell cancer of left lung ([place]) 11/29/2019  . Hemoptysis 11/29/2019   Durwin Reges DPT Durwin Reges 10/21/2020, 10:29 AM  [facility] [address] [place], [address] Phone: [phone]   Fax:  [phone]  Name: BREYSON KELM MRN: [id number] Date of Birth: 10/06/1971

## 2020-10-22 ENCOUNTER — Other Ambulatory Visit: Payer: Self-pay | Admitting: *Deleted

## 2020-10-22 DIAGNOSIS — C3412 Malignant neoplasm of upper lobe, left bronchus or lung: Secondary | ICD-10-CM

## 2020-10-23 ENCOUNTER — Inpatient Hospital Stay: Payer: 59

## 2020-10-23 ENCOUNTER — Encounter: Payer: Self-pay | Admitting: Oncology

## 2020-10-23 ENCOUNTER — Inpatient Hospital Stay (HOSPITAL_BASED_OUTPATIENT_CLINIC_OR_DEPARTMENT_OTHER): Payer: 59 | Admitting: Oncology

## 2020-10-23 ENCOUNTER — Other Ambulatory Visit: Payer: Self-pay

## 2020-10-23 VITALS — BP 120/87 | HR 80 | Temp 98.1°F | Wt 169.4 lb

## 2020-10-23 DIAGNOSIS — G893 Neoplasm related pain (acute) (chronic): Secondary | ICD-10-CM

## 2020-10-23 DIAGNOSIS — Z5111 Encounter for antineoplastic chemotherapy: Secondary | ICD-10-CM | POA: Diagnosis not present

## 2020-10-23 DIAGNOSIS — C3412 Malignant neoplasm of upper lobe, left bronchus or lung: Secondary | ICD-10-CM

## 2020-10-23 DIAGNOSIS — D701 Agranulocytosis secondary to cancer chemotherapy: Secondary | ICD-10-CM

## 2020-10-23 DIAGNOSIS — T451X5A Adverse effect of antineoplastic and immunosuppressive drugs, initial encounter: Secondary | ICD-10-CM

## 2020-10-23 DIAGNOSIS — C3490 Malignant neoplasm of unspecified part of unspecified bronchus or lung: Secondary | ICD-10-CM

## 2020-10-23 LAB — FOLATE: Folate: 14.4 ng/mL (ref >5.9)

## 2020-10-23 LAB — COMPREHENSIVE METABOLIC PANEL
ALT: 86 U/L — ABNORMAL HIGH (ref 0–44)
AST: 58 U/L — ABNORMAL HIGH (ref 15–41)
Albumin: 3.7 g/dL (ref 3.5–5.0)
Alkaline Phosphatase: 82 U/L (ref 38–126)
Anion gap: 9 (ref 5–15)
BUN: 9 mg/dL (ref 6–20)
CO2: 25 mmol/L (ref 22–32)
Calcium: 9.1 mg/dL (ref 8.9–10.3)
Chloride: 100 mmol/L (ref 98–111)
Creatinine, Ser: 0.88 mg/dL (ref 0.61–1.24)
GFR, Estimated: >60 mL/min (ref >60)
Glucose, Bld: 93 mg/dL (ref 70–99)
Potassium: 3.6 mmol/L (ref 3.5–5.1)
Sodium: 134 mmol/L — ABNORMAL LOW (ref 135–145)
Total Bilirubin: 0.7 mg/dL (ref 0.3–1.2)
Total Protein: 7.4 g/dL (ref 6.5–8.1)

## 2020-10-23 LAB — CBC WITH DIFFERENTIAL/PLATELET
Abs Immature Granulocytes: 0.01 10*3/uL (ref 0.00–0.07)
Basophils Absolute: 0 10*3/uL (ref 0.0–0.1)
Basophils Relative: 1 %
Eosinophils Absolute: 0.1 10*3/uL (ref 0.0–0.5)
Eosinophils Relative: 4 %
HCT: 32.8 % — ABNORMAL LOW (ref 39.0–52.0)
Hemoglobin: 11 g/dL — ABNORMAL LOW (ref 13.0–17.0)
Immature Granulocytes: 1 %
Lymphocytes Relative: 35 %
Lymphs Abs: 0.8 10*3/uL (ref 0.7–4.0)
MCH: 31.3 pg (ref 26.0–34.0)
MCHC: 33.5 g/dL (ref 30.0–36.0)
MCV: 93.4 fL (ref 80.0–100.0)
Monocytes Absolute: 0.5 10*3/uL (ref 0.1–1.0)
Monocytes Relative: 23 %
Neutro Abs: 0.8 10*3/uL — ABNORMAL LOW (ref 1.7–7.7)
Neutrophils Relative %: 36 %
Platelets: 218 10*3/uL (ref 150–400)
RBC: 3.51 MIL/uL — ABNORMAL LOW (ref 4.22–5.81)
RDW: 18.9 % — ABNORMAL HIGH (ref 11.5–15.5)
WBC: 2.1 10*3/uL — ABNORMAL LOW (ref 4.0–10.5)
nRBC: 0 % (ref 0.0–0.2)

## 2020-10-23 LAB — IRON AND TIBC
Iron: 75 ug/dL (ref 45–182)
Saturation Ratios: 28 % (ref 17.9–39.5)
TIBC: 267 ug/dL (ref 250–450)
UIBC: 192 ug/dL

## 2020-10-23 LAB — RETICULOCYTES
Immature Retic Fract: 16.4 % — ABNORMAL HIGH (ref 2.3–15.9)
RBC.: 3.53 MIL/uL — ABNORMAL LOW (ref 4.22–5.81)
Retic Count, Absolute: 111.5 10*3/uL (ref 19.0–186.0)
Retic Ct Pct: 3.2 % — ABNORMAL HIGH (ref 0.4–3.1)

## 2020-10-23 LAB — FERRITIN: Ferritin: 435 ng/mL — ABNORMAL HIGH (ref 24–336)

## 2020-10-23 LAB — TSH: TSH: 2.019 u[IU]/mL (ref 0.350–4.500)

## 2020-10-23 LAB — VITAMIN B12: Vitamin B-12: 755 pg/mL (ref 180–914)

## 2020-10-23 MED ORDER — SODIUM CHLORIDE 0.9% FLUSH
10.0000 mL | Freq: Once | INTRAVENOUS | Status: AC
  Administered 2020-10-23: 10 mL via INTRAVENOUS
  Filled 2020-10-23: qty 10

## 2020-10-23 MED ORDER — HEPARIN SOD (PORK) LOCK FLUSH 100 UNIT/ML IV SOLN
500.0000 [IU] | Freq: Once | INTRAVENOUS | Status: AC
  Administered 2020-10-23: 500 [IU] via INTRAVENOUS
  Filled 2020-10-23: qty 5

## 2020-10-25 ENCOUNTER — Other Ambulatory Visit: Payer: Self-pay | Admitting: *Deleted

## 2020-10-25 DIAGNOSIS — C3490 Malignant neoplasm of unspecified part of unspecified bronchus or lung: Secondary | ICD-10-CM

## 2020-10-25 NOTE — Progress Notes (Signed)
Hematology/Oncology Consult note [facility]  [phone] [phone]  Patient Care Team: Default, Provider, MD as PCP - General Telford Nab, RN as Registered Nurse Sindy Guadeloupe, MD as Consulting Physician (Hematology and Oncology) Sindy Guadeloupe, MD as Consulting Physician (Hematology and Oncology)   Name of the patient: Kyle Guerrero  [id number]  1971/09/21   Date of visit: 10/25/20  Diagnosis-  Non-small cell lung cancer stage IV acT2 cN2 cM1 a with pleural involvement   Chief complaint/ Reason for visit-on treatment assessment prior to cycle 4 of carbo [name] chemotherapy  Heme/Onc history: patient is a 49 year old male who presented to the ER with symptoms ofheaviness in his chest and upper left chest discomfort he underwent CT angio chest to rule out PE which showed 3.8 x 3.3 cm left upper palpable lung mass along with 4.4 x 3.3 cm lobulated mass in the aortopulmonary window and 2.6 x 2.4 cm left hilar mass all concerning for malignancy. Patient has also seen pulmonary and has been set up for bronchoscopy and EBUS guided biopsy on 11/23/2019. Patient underwent. PET CT scan which showed a hypermetabolic spiculated 3.5 cm of 5 left upper lobe lung mass, adjacent hypermetabolic 3.2 x 1.2 cm pleural metastases in the medial posterior by the left pleural space along with scalloping of the adjacent posterior left third rib. Hypermetabolic infiltrative left perihilar conglomerate nodal metastases measuring up to 7.3 x 3.6 cm and 0.8 cm high left mediastinal node between the left brachiocephalic vein and left subclavian artery. No evidence of distant metastatic disease  Biopsy showed non-small cell lung cancer but further characterization could not be determined. Insufficient tissue for NGS testing. Repeat biopsy done. Results of NGStestingshowed PD-L1 50%. Tumor mutational burden high. 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  Patient completed concurrent chemoradiation with carbotaxol chemotherapy followed by 2 cycles of carbotaxol Keytruda andwason maintenance Keytruda  Patient was complaining of left shoulder pain and underwent MRI of the shoulder which showed8 in the supraspinatus tendon. He was subsequently seen by [facility] and underwent MRI of the cervical spine which showed a 4.6 x 4.8 x 8.2 cm mass in the left prevertebral region from C2-C7 levels. The mass partially encases the left vertebral artery and abuts the preforaminal segment. Invades the vertebral bodies and edema of the left C5 articular pillar. There is slight extension into the left C4-C5 thecal sac without central thecal sac compression. The mass displaces the left pharynx anteriorly along the left carotid sheath.Patient had a repeat biopsy which was consistent with adenocarcinoma. Patient is undergoing palliative radiation treatment and plan is to proceed with carboplatin and [name] while awaiting repeat NGS testing    Interval history- left upper extremity strength is improving but not back to baseline.  He is not using his oxycodone much at all. Appetite and weight is stable  ECOG PS- 1 Pain scale- 2 Opioid associated constipation- no  Review of systems- Review of Systems  Constitutional: Negative for chills, fever, malaise/fatigue and weight loss.  HENT: Negative for congestion, ear discharge and nosebleeds.   Eyes: Negative for blurred vision.  Respiratory: Negative for cough, hemoptysis, sputum production, shortness of breath and wheezing.   Cardiovascular: Negative for chest pain, palpitations, orthopnea and claudication.  Gastrointestinal: Negative for abdominal pain, blood in stool, constipation, diarrhea, heartburn, melena, nausea and vomiting.  Genitourinary: Negative for dysuria, flank pain, frequency, hematuria and urgency.  Musculoskeletal: Negative for back pain, joint  pain and myalgias.       Left upper extremity weakness  Skin: Negative for rash.  Neurological: Negative for dizziness, tingling, focal weakness, seizures, weakness and headaches.  Endo/Heme/Allergies: Does not bruise/bleed easily.  Psychiatric/Behavioral: Negative for depression and suicidal ideas. The patient does not have insomnia.       No Known Allergies   Past Medical History:  Diagnosis Date   Asthma    Lung cancer ([place])      Past Surgical History:  Procedure Laterality Date   CYST EXCISION     IR IMAGING GUIDED PORT INSERTION  12/05/2019   VIDEO BRONCHOSCOPY WITH ENDOBRONCHIAL ULTRASOUND Left 11/22/2019   Procedure: VIDEO BRONCHOSCOPY WITH ENDOBRONCHIAL ULTRASOUND;  Surgeon: Tyler Pita, MD;  Location: [facility] ORS;  Service: Thoracic;  Laterality: Left;    Social History   Socioeconomic History   Marital status: Single    Spouse name: Not on file   Number of children: Not on file   Years of education: Not on file   Highest education level: Not on file  Occupational History   Not on file  Tobacco Use   Smoking status: Former Smoker    Packs/day: 2.00    Types: Cigarettes    Quit date: 11/08/2019    Years since quitting: 0.9   Smokeless tobacco: Never Used  Vaping Use   Vaping Use: Never used  Substance and Sexual Activity   Alcohol use: Not Currently   Drug use: Yes    Types: Marijuana   Sexual activity: Not on file  Other Topics Concern   Not on file  Social History Narrative   Not on file   Social Determinants of Health   Financial Resource Strain:    Difficulty of Paying Living Expenses: Not on file  Food Insecurity:    Worried About Charity fundraiser in the Last Year: Not on file   [employer] of Food in the Last Year: Not on file  Transportation Needs:    Lack of Transportation (Medical): Not on file   Lack of Transportation (Non-Medical): Not on file  Physical Activity:    Days of Exercise per Week: Not on file    Minutes of Exercise per Session: Not on file  Stress:    Feeling of Stress : Not on file  Social Connections:    Frequency of Communication with Friends and Family: Not on file   Frequency of Social Gatherings with Friends and Family: Not on file   Attends Religious Services: Not on file   Active Member of Clubs or Organizations: Not on file   Attends Archivist Meetings: Not on file   Marital Status: Not on file  Intimate Partner Violence:    Fear of Current or Ex-Partner: Not on file   Emotionally Abused: Not on file   Physically Abused: Not on file   Sexually Abused: Not on file    Family History  Problem Relation Age of Onset   Healthy Mother    Healthy Father      Current Outpatient Medications:    acetaminophen (TYLENOL) 325 MG tablet, Take 650 mg by mouth every 6 (six) hours as needed. (Patient not taking: Reported on 10/23/2020), Disp: , Rfl:    azelastine (ASTELIN) 0.1 % nasal spray, Place 1 spray into the nose as needed.  (Patient not taking: Reported on 10/23/2020), Disp: , Rfl:    dexamethasone (DECADRON) 4 MG tablet, Take 1 tablet (4 mg total) by mouth 2 (two) times daily  with a meal. (Patient not taking: Reported on 10/23/2020), Disp: 60 tablet, Rfl: 0   folic acid (FOLVITE) 1 MG tablet, Take 1 tablet (1 mg total) by mouth daily. Start 5-7 days before [name] chemotherapy. Continue until 21 days after [name] completed. (Patient not taking: Reported on 10/23/2020), Disp: 100 tablet, Rfl: 3   lidocaine-prilocaine (EMLA) cream, Apply to affected area once (Patient not taking: Reported on 10/23/2020), Disp: 30 g, Rfl: 3   ondansetron (ZOFRAN) 8 MG tablet, Take 1 tablet (8 mg total) by mouth 2 (two) times daily as needed (Nausea or vomiting). Start if needed on the third day after chemotherapy. (Patient not taking: Reported on 08/21/2020), Disp: 30 tablet, Rfl: 1   oxyCODONE (OXY IR/ROXICODONE) 5 MG immediate release tablet, Take 1 tablet (5 mg  total) by mouth every 4 (four) hours as needed for moderate pain or severe pain. (Patient not taking: Reported on 10/23/2020), Disp: 180 tablet, Rfl: 0   prochlorperazine (COMPAZINE) 10 MG tablet, Take 1 tablet (10 mg total) by mouth every 6 (six) hours as needed (Nausea or vomiting). (Patient not taking: Reported on 08/21/2020), Disp: 30 tablet, Rfl: 1   sucralfate (CARAFATE) 1 g tablet, Take 1 tablet (1 g total) by mouth 3 (three) times daily before meals. Dissolve in warm water, swish and swallow (Patient not taking: Reported on 10/23/2020), Disp: 90 tablet, Rfl: 6 No current facility-administered medications for this visit.  Facility-Administered Medications Ordered in Other Visits:    sodium chloride flush (NS) 0.9 % injection 10 mL, 10 mL, Intravenous, PRN, Sindy Guadeloupe, MD, 10 mL at 05/01/20 0900  Physical exam:  Vitals:   10/23/20 0952 10/23/20 0954  BP:  120/87  Pulse:  80  Temp:  98.1 F (36.7 C)  TempSrc:  Tympanic  SpO2:  100%  Weight: 169 lb 6.4 oz (76.8 kg) 169 lb 6.4 oz (76.8 kg)   Physical Exam Constitutional:      General: He is not in acute distress. HENT:     Head: Normocephalic.  Eyes:     Pupils: Pupils are equal, round, and reactive to light.  Cardiovascular:     Rate and Rhythm: Normal rate and regular rhythm.     Heart sounds: Normal heart sounds.  Pulmonary:     Effort: Pulmonary effort is normal.     Breath sounds: Normal breath sounds.  Abdominal:     General: Bowel sounds are normal.     Palpations: Abdomen is soft.  Skin:    General: Skin is warm and dry.  Neurological:     Mental Status: He is alert and oriented to person, place, and time.     Comments: Power 4/5 in LUE. Overhead abduction is now possible      CMP Latest Ref Rng & Units 10/23/2020  Glucose 70 - 99 mg/dL 93  BUN 6 - 20 mg/dL 9  Creatinine 0.61 - 1.24 mg/dL 0.88  Sodium 135 - 145 mmol/L 134(L)  Potassium 3.5 - 5.1 mmol/L 3.6  Chloride 98 - 111 mmol/L 100  CO2 22 - 32  mmol/L 25  Calcium 8.9 - 10.3 mg/dL 9.1  Total Protein 6.5 - 8.1 g/dL 7.4  Total Bilirubin 0.3 - 1.2 mg/dL 0.7  Alkaline Phos 38 - 126 U/L 82  AST 15 - 41 U/L 58(H)  ALT 0 - 44 U/L 86(H)   CBC Latest Ref Rng & Units 10/23/2020  WBC 4.0 - 10.5 K/uL 2.1(L)  Hemoglobin 13.0 - 17.0 g/dL 11.0(L)  Hematocrit 39 -  52 % 32.8(L)  Platelets 150 - 400 K/uL 218    Assessment and plan- Patient is a 49 y.o. male with stage IV adenocarcinoma of the lung with pleural metastases.He now presents with a large neck mass with biopsy consistent with previously diagnosed non-small cell lung cancer.  He is here for on treatment assessment prior to cycle 4 of [place] alimta chemotherapy  Patient had onpro neulasta sent home by his insurance. He could not place it himself. Per policy we cannot do it for him since this drug was supplied by his insurance. He therefore did not get onpro with cycle 3.  His anc is <1 today and chemo will be delayed by 1 week. If his anc is >1, next week he will get chemo and get some relative to put onpro neulasta for him  Plan to repeat scans after 4 cycles- ct neck chest abdomen pelvis with contrast and mri brain  and I will see him the day he will receive single agent alimta   Visit Diagnosis 1. Malignant neoplasm of upper lobe of left lung ([place])   2. Encounter for antineoplastic chemotherapy   3. Chemotherapy induced neutropenia ([facility])   4. Neoplasm related pain      Dr. Randa Evens, MD, MPH [facility] [phone] 10/25/2020 5:08 PM

## 2020-10-27 ENCOUNTER — Ambulatory Visit: Payer: 59 | Admitting: Physical Therapy

## 2020-10-29 ENCOUNTER — Ambulatory Visit: Payer: 59 | Attending: Oncology | Admitting: Physical Therapy

## 2020-10-29 ENCOUNTER — Encounter: Payer: Self-pay | Admitting: Physical Therapy

## 2020-10-29 ENCOUNTER — Other Ambulatory Visit: Payer: Self-pay

## 2020-10-29 DIAGNOSIS — M25612 Stiffness of left shoulder, not elsewhere classified: Secondary | ICD-10-CM | POA: Diagnosis not present

## 2020-10-29 DIAGNOSIS — M25512 Pain in left shoulder: Secondary | ICD-10-CM | POA: Diagnosis present

## 2020-10-29 DIAGNOSIS — R293 Abnormal posture: Secondary | ICD-10-CM | POA: Insufficient documentation

## 2020-10-29 NOTE — Therapy (Signed)
[place] [address] [place], [address] Phone: [phone]   Fax:  [phone]  Physical Therapy Treatment  Patient Details  Name: Kyle Guerrero MRN: [id number] Date of Birth: [birth year] No data recorded  Encounter Date: 10/29/2020   PT End of Session - 10/29/20 1027    Visit Number 7    Number of Visits 17    Date for PT Re-Evaluation 11/27/20    PT Start Time 0945    PT Stop Time 1025    PT Time Calculation (min) 40 min    Activity Tolerance Patient tolerated treatment well    Behavior During Therapy [facility] for tasks assessed/performed           Past Medical History:  Diagnosis Date  . Asthma   . Lung cancer [facility])     Past Surgical History:  Procedure Laterality Date  . CYST EXCISION    . IR IMAGING GUIDED PORT INSERTION  12/05/2019  . VIDEO BRONCHOSCOPY WITH ENDOBRONCHIAL ULTRASOUND Left 11/22/2019   Procedure: VIDEO BRONCHOSCOPY WITH ENDOBRONCHIAL ULTRASOUND;  Surgeon: Tyler Pita, MD;  Location: [facility] ORS;  Service: Thoracic;  Laterality: Left;    There were no vitals filed for this visit.   Subjective Assessment - 10/29/20 1025    Subjective Patient reports soreness in L shoulder but no pain. Pt reports he has not been doing HEP except pulleys and has tried to do some push ups. PT will progress as able            Ther-Ex - Arm bike L9>8 11mins FWD 2ins BWD gentle strengthening  - Total gym pull up  L14 x10; L15 x9; L16 x8 - Elevated push up from 2.14ft table 3x 8/7/6 with min cuing for even push through each UE, on lower mat table  - Scapular lift offs 3x10  - Scaption 1# 3x 10 with scapula against wall with better carry over of technique this way -banded pulls 2x8 red band with   -wall ball in all 4 directions with cues to keep elbows straight and resistance on ball 2x8                          PT Education - 10/29/20 1254    Education Details  therex form/technique    Person(s) Educated Patient    Methods Explanation;Demonstration;Verbal cues    Comprehension Verbalized understanding;Returned demonstration;Verbal cues required            PT Short Term Goals - 09/30/20 1153      PT SHORT TERM GOAL #1   Title Pt will be independent with HEP in order to improve strength and decrease pain in order to improve pain-free function at home and work.    Baseline 09/29/20 HEP given    Time 4    Period Weeks    Status New             PT Long Term Goals - 09/30/20 1153      PT LONG TERM GOAL #1   Title Patient will increase FOTO score to 63 to demonstrate predicted increase in functional mobility to complete ADLs    Baseline 09/29/20 48    Time 8    Period Weeks    Status New      PT LONG TERM GOAL #2   Title Pt will decrease worst pain as reported on NPRS by at least 3 points in order  to demonstrate clinically significant reduction in pain.    Baseline 09/29/20 8/10    Time 8    Period Weeks    Status New      PT LONG TERM GOAL #3   Title Patient will demonstrate at least 4+/5 gross L shoulder and periscapular strength to demonstrate symmetrical PLOF and strength needed for heavy household chores    Baseline 09/29/20 L shoulder flex: 3+/5 ABD 3+/5 ER 3/5 IR 3+/5 Y 3-/5 T 3/5    Time 8    Period Weeks    Status New                 Plan - 10/29/20 1027    Clinical Impression Statement Pt returns to therapy with complaints of soreness in L shoulder but is able to complete all exercises with improvements in scapulohumeral rhythm and strength.Pt is able to complete new exercises to focus on scapular stability and strength with mild difficulty but no pain. Pt continues to make good mobility with overhead movements and is able to progress with PT as tolerated.    Personal Factors and Comorbidities Comorbidity 1;Fitness;Comorbidity 2;Profession;Time since onset of injury/illness/exacerbation;Past/Current Experience     Comorbidities asthma, non small cell lung cancer    Examination-Activity Limitations Bathing;Reach Overhead;Lift;Sleep;Carry;Bed Mobility    Examination-Participation Restrictions Occupation;Community Activity;Meal Prep;Laundry;Cleaning    Stability/Clinical Decision Making Evolving/Moderate complexity    Clinical Decision Making Moderate    Rehab Potential Fair    PT Frequency 2x / week    PT Duration 8 weeks    PT Treatment/Interventions ADLs/Self Care Home Management;Cryotherapy;Iontophoresis 4mg /ml Dexamethasone;Functional mobility training;Patient/family education;Manual techniques;Joint Manipulations;Spinal Manipulations;Dry needling;Manual lymph drainage;Taping;Therapeutic exercise;Gait training;Traction;Ultrasound;Electrical Stimulation;Moist Heat;DME Instruction;Therapeutic activities;Neuromuscular re-education    PT Next Visit Plan CKC, overhead mobility and strengthening    PT Home Exercise Plan rows, high rows, Prone T    Consulted and Agree with Plan of Care Patient           Patient will benefit from skilled therapeutic intervention in order to improve the following deficits and impairments:  Decreased mobility, Increased muscle spasms, Impaired sensation, Improper body mechanics, Impaired tone, Decreased range of motion, Decreased strength, Increased fascial restricitons, Impaired flexibility, Impaired UE functional use, Postural dysfunction, Pain, Decreased activity tolerance  Visit Diagnosis: Stiffness of left shoulder, not elsewhere classified  Abnormal posture  Left shoulder pain, unspecified chronicity     Problem List Patient Active Problem List   Diagnosis Date Noted  . Goals of care, counseling/discussion 11/29/2019  . Malignant neoplasm of upper lobe of left lung ([place]) 11/29/2019  . Non-small cell cancer of left lung ([place]) 11/29/2019  . Hemoptysis 11/29/2019    Durwin Reges DPT [facility], SPT  Durwin Reges 10/29/2020, 12:56 PM  [facility] [address], [place], [address] Phone: [phone]   Fax:  [phone]  Name: Kyle Guerrero MRN: [id number] Date of Birth: 08-22-1971

## 2020-10-30 ENCOUNTER — Ambulatory Visit: Payer: 59

## 2020-10-30 ENCOUNTER — Other Ambulatory Visit: Payer: Self-pay | Admitting: Oncology

## 2020-10-30 ENCOUNTER — Inpatient Hospital Stay: Payer: 59

## 2020-10-30 ENCOUNTER — Inpatient Hospital Stay: Payer: 59 | Attending: Oncology

## 2020-10-30 VITALS — BP 132/80 | HR 88 | Temp 97.0°F | Wt 172.0 lb

## 2020-10-30 DIAGNOSIS — E538 Deficiency of other specified B group vitamins: Secondary | ICD-10-CM | POA: Diagnosis not present

## 2020-10-30 DIAGNOSIS — Z79899 Other long term (current) drug therapy: Secondary | ICD-10-CM | POA: Diagnosis not present

## 2020-10-30 DIAGNOSIS — C3412 Malignant neoplasm of upper lobe, left bronchus or lung: Secondary | ICD-10-CM

## 2020-10-30 DIAGNOSIS — Z5111 Encounter for antineoplastic chemotherapy: Secondary | ICD-10-CM | POA: Insufficient documentation

## 2020-10-30 LAB — COMPREHENSIVE METABOLIC PANEL
ALT: 93 U/L — ABNORMAL HIGH (ref 0–44)
AST: 48 U/L — ABNORMAL HIGH (ref 15–41)
Albumin: 4 g/dL (ref 3.5–5.0)
Alkaline Phosphatase: 88 U/L (ref 38–126)
Anion gap: 10 (ref 5–15)
BUN: 12 mg/dL (ref 6–20)
CO2: 24 mmol/L (ref 22–32)
Calcium: 9.4 mg/dL (ref 8.9–10.3)
Chloride: 100 mmol/L (ref 98–111)
Creatinine, Ser: 0.9 mg/dL (ref 0.61–1.24)
GFR, Estimated: >60 mL/min (ref >60)
Glucose, Bld: 167 mg/dL — ABNORMAL HIGH (ref 70–99)
Potassium: 3.9 mmol/L (ref 3.5–5.1)
Sodium: 134 mmol/L — ABNORMAL LOW (ref 135–145)
Total Bilirubin: 0.7 mg/dL (ref 0.3–1.2)
Total Protein: 7.7 g/dL (ref 6.5–8.1)

## 2020-10-30 LAB — CBC WITH DIFFERENTIAL/PLATELET
Abs Immature Granulocytes: 0.01 10*3/uL (ref 0.00–0.07)
Basophils Absolute: 0 10*3/uL (ref 0.0–0.1)
Basophils Relative: 1 %
Eosinophils Absolute: 0.2 10*3/uL (ref 0.0–0.5)
Eosinophils Relative: 5 %
HCT: 36.2 % — ABNORMAL LOW (ref 39.0–52.0)
Hemoglobin: 12 g/dL — ABNORMAL LOW (ref 13.0–17.0)
Immature Granulocytes: 0 %
Lymphocytes Relative: 31 %
Lymphs Abs: 1 10*3/uL (ref 0.7–4.0)
MCH: 31.3 pg (ref 26.0–34.0)
MCHC: 33.1 g/dL (ref 30.0–36.0)
MCV: 94.5 fL (ref 80.0–100.0)
Monocytes Absolute: 0.6 10*3/uL (ref 0.1–1.0)
Monocytes Relative: 18 %
Neutro Abs: 1.5 10*3/uL — ABNORMAL LOW (ref 1.7–7.7)
Neutrophils Relative %: 45 %
Platelets: 225 10*3/uL (ref 150–400)
RBC: 3.83 MIL/uL — ABNORMAL LOW (ref 4.22–5.81)
RDW: 18.6 % — ABNORMAL HIGH (ref 11.5–15.5)
WBC: 3.2 10*3/uL — ABNORMAL LOW (ref 4.0–10.5)
nRBC: 0 % (ref 0.0–0.2)

## 2020-10-30 MED ORDER — SODIUM CHLORIDE 0.9 % IV SOLN
150.0000 mg | Freq: Once | INTRAVENOUS | Status: AC
  Administered 2020-10-30: 150 mg via INTRAVENOUS
  Filled 2020-10-30: qty 150

## 2020-10-30 MED ORDER — CYANOCOBALAMIN 1000 MCG/ML IJ SOLN
1000.0000 ug | Freq: Once | INTRAMUSCULAR | Status: AC
  Administered 2020-10-30: 1000 ug via INTRAMUSCULAR
  Filled 2020-10-30: qty 1

## 2020-10-30 MED ORDER — SODIUM CHLORIDE 0.9% FLUSH
10.0000 mL | INTRAVENOUS | Status: DC | PRN
  Filled 2020-10-30: qty 10

## 2020-10-30 MED ORDER — SODIUM CHLORIDE 0.9 % IV SOLN
680.0000 mg | Freq: Once | INTRAVENOUS | Status: AC
  Administered 2020-10-30: 680 mg via INTRAVENOUS
  Filled 2020-10-30: qty 68

## 2020-10-30 MED ORDER — SODIUM CHLORIDE 0.9 % IV SOLN
10.0000 mg | Freq: Once | INTRAVENOUS | Status: AC
  Administered 2020-10-30: 10 mg via INTRAVENOUS
  Filled 2020-10-30: qty 10

## 2020-10-30 MED ORDER — PALONOSETRON HCL INJECTION 0.25 MG/5ML
0.2500 mg | Freq: Once | INTRAVENOUS | Status: AC
  Administered 2020-10-30: 0.25 mg via INTRAVENOUS
  Filled 2020-10-30: qty 5

## 2020-10-30 MED ORDER — PEGFILGRASTIM 6 MG/0.6ML ~~LOC~~ PSKT: 6.0000 mg | PREFILLED_SYRINGE | Freq: Once | SUBCUTANEOUS | Status: DC

## 2020-10-30 MED ORDER — HEPARIN SOD (PORK) LOCK FLUSH 100 UNIT/ML IV SOLN
500.0000 [IU] | Freq: Once | INTRAVENOUS | Status: AC
  Administered 2020-10-30: 500 [IU] via INTRAVENOUS
  Filled 2020-10-30: qty 5

## 2020-10-30 MED ORDER — SODIUM CHLORIDE 0.9 % IV SOLN
Freq: Once | INTRAVENOUS | Status: AC
  Filled 2020-10-30: qty 250

## 2020-10-30 MED ORDER — SODIUM CHLORIDE 0.9 % IV SOLN
500.0000 mg/m2 | Freq: Once | INTRAVENOUS | Status: AC
  Administered 2020-10-30: 1000 mg via INTRAVENOUS
  Filled 2020-10-30: qty 40

## 2020-10-30 NOTE — Progress Notes (Signed)
Pt stable at discharge.  

## 2020-11-03 ENCOUNTER — Encounter: Payer: Self-pay | Admitting: Physical Therapy

## 2020-11-03 ENCOUNTER — Other Ambulatory Visit: Payer: Self-pay

## 2020-11-03 ENCOUNTER — Ambulatory Visit: Payer: 59 | Admitting: Physical Therapy

## 2020-11-03 DIAGNOSIS — M25612 Stiffness of left shoulder, not elsewhere classified: Secondary | ICD-10-CM | POA: Diagnosis not present

## 2020-11-03 DIAGNOSIS — R293 Abnormal posture: Secondary | ICD-10-CM

## 2020-11-03 DIAGNOSIS — M25512 Pain in left shoulder: Secondary | ICD-10-CM

## 2020-11-03 NOTE — Therapy (Signed)
[place] [facility] [address], [place], [address] Phone: [phone]   Fax:  [phone]  Physical Therapy Treatment  Patient Details  Name: Kyle Guerrero MRN: [id number] Date of Birth: 1971-10-02 No data recorded  Encounter Date: 11/03/2020   PT End of Session - 11/03/20 1038    Visit Number 8    Number of Visits 17    Date for PT Re-Evaluation 11/27/20    PT Start Time 0945    PT Stop Time 1025    PT Time Calculation (min) 40 min    Activity Tolerance Patient tolerated treatment well    Behavior During Therapy [facility] for tasks assessed/performed           Past Medical History:  Diagnosis Date  . Asthma   . Lung cancer [facility])     Past Surgical History:  Procedure Laterality Date  . CYST EXCISION    . IR IMAGING GUIDED PORT INSERTION  12/05/2019  . VIDEO BRONCHOSCOPY WITH ENDOBRONCHIAL ULTRASOUND Left 11/22/2019   Procedure: VIDEO BRONCHOSCOPY WITH ENDOBRONCHIAL ULTRASOUND;  Surgeon: Tyler Pita, MD;  Location: [facility] ORS;  Service: Thoracic;  Laterality: Left;    There were no vitals filed for this visit.   Subjective Assessment - 11/03/20 0950    Subjective Patient reports the pain in his L shoulder still feels sore and at worst it is a 3/10. Pt states he has been working on streching and moving his arm around at home but still feels that the rhythm is not in sync when he raises both arms.    Pertinent History Patient with c/o L shoulder pain and decreased function for multiple months prior to MRI of the shoulder/neck revealing mass partially encasing the left vertebral artery and abutting to the preforaminal segment; invading the vertebral bodies and edema of the left C5 articular pillar. Underwent radiation finishing on 09/04/20, with regaining of LUE function since. Currently undergoing chemo Infusion 1x every 3 weeks. Pertinent PMH of non-small cell lung cancer, in remission since summer 2021  following concurrent chemoradiation with carbotaxol chemotherapy followed by 2 cycles of carbotaxol Keytruda and was on maintenance Keytruda. Reports initially he did not have any function in LUE and has been working on moving his LUE more. Reports he has some tingling in palmar side thumb, pointer finger, and middle finger; but reports normal sensations to hot/cold, sharp/dull, etc. Continues to have pain along medial border of L scapula that is aching in nature, that is always an 6/10, gets up to 8/10 with heavy lifting and when pressure is applied to ie (ie sleeping). 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 Patient enjoyed shooting pool and playing basketball prior to diagnosis. Patient is L handed and reports he cannot reach overhead with anything of weight, can bathe and groom hair at this point. Pt denies N/V, B&B changes, unexplained weight fluctuation, saddle paresthesia, fever, night sweats, or unrelenting night pain at this time.    Limitations House hold activities;Lifting    How long can you sit comfortably? no time    How long can you stand comfortably? no time    How long can you walk comfortably? no time    Patient Stated Goals Regain strength in L arm    Currently in Pain? No/denies    Pain Score 0-No pain    Pain Onset More  than a month ago            Ther-Ex - Arm bike L9> 47mins FWD 2ins BWD gentle strengthening  - Total gym pull up  L15 x10; L16 x8; L17 x8 - Elevated push up from table 3x 10/10/8 with min cuing for even push through each UE - Scaption 1# 3x 10 against half foam rolll with better carry over of technique this way -Standing abduction with 1# 2x10 against half foam roll  -Snow angels against half foam roll AROM for scapulohumeral rhythm 1x10                         PT Education - 11/03/20 1038    Education Details therex form/technique     Methods Explanation;Demonstration;Verbal cues    Comprehension Verbalized understanding;Returned demonstration;Verbal cues required            PT Short Term Goals - 09/30/20 1153      PT SHORT TERM GOAL #1   Title Pt will be independent with HEP in order to improve strength and decrease pain in order to improve pain-free function at home and work.    Baseline 09/29/20 HEP given    Time 4    Period Weeks    Status New             PT Long Term Goals - 09/30/20 1153      PT LONG TERM GOAL #1   Title Patient will increase FOTO score to 63 to demonstrate predicted increase in functional mobility to complete ADLs    Baseline 09/29/20 48    Time 8    Period Weeks    Status New      PT LONG TERM GOAL #2   Title Pt will decrease worst pain as reported on NPRS by at least 3 points in order to demonstrate clinically significant reduction in pain.    Baseline 09/29/20 8/10    Time 8    Period Weeks    Status New      PT LONG TERM GOAL #3   Title Patient will demonstrate at least 4+/5 gross L shoulder and periscapular strength to demonstrate symmetrical PLOF and strength needed for heavy household chores    Baseline 09/29/20 L shoulder flex: 3+/5 ABD 3+/5 ER 3/5 IR 3+/5 Y 3-/5 T 3/5    Time 8    Period Weeks    Status New                 Plan - 11/03/20 1038    Clinical Impression Statement Pt continues to have soreness in L shoulder but stated he feels better this week compared to last. PT session continued with exercises to focus on normalizing scapulohumeral rhythm in LUE using a half foam roll as tactile cue to ensure proper form. Pt is able to raise arms to be [facility] but continues to lack strength and motor control. PT will progress to focus on neuromuscular control and UE strengthening as tolerable.    Personal Factors and Comorbidities Comorbidity 1;Fitness;Comorbidity 2;Profession;Time since onset of injury/illness/exacerbation;Past/Current Experience    Comorbidities  asthma, non small cell lung cancer    Examination-Activity Limitations Bathing;Reach Overhead;Lift;Sleep;Carry;Bed Mobility    Examination-Participation Restrictions Occupation;Community Activity;Meal Prep;Laundry;Cleaning    Stability/Clinical Decision Making Evolving/Moderate complexity    Clinical Decision Making Moderate    Rehab Potential Fair    PT Frequency 2x / week    PT Duration 8 weeks  PT Treatment/Interventions ADLs/Self Care Home Management;Cryotherapy;Iontophoresis 4mg /ml Dexamethasone;Functional mobility training;Patient/family education;Manual techniques;Joint Manipulations;Spinal Manipulations;Dry needling;Manual lymph drainage;Taping;Therapeutic exercise;Gait training;Traction;Ultrasound;Electrical Stimulation;Moist Heat;DME Instruction;Therapeutic activities;Neuromuscular re-education    PT Next Visit Plan CKC, overhead mobility and strengthening    PT Home Exercise Plan rows, high rows, Prone T    Consulted and Agree with Plan of Care Patient           Patient will benefit from skilled therapeutic intervention in order to improve the following deficits and impairments:  Decreased mobility, Increased muscle spasms, Impaired sensation, Improper body mechanics, Impaired tone, Decreased range of motion, Decreased strength, Increased fascial restricitons, Impaired flexibility, Impaired UE functional use, Postural dysfunction, Pain, Decreased activity tolerance  Visit Diagnosis: Stiffness of left shoulder, not elsewhere classified  Abnormal posture  Left shoulder pain, unspecified chronicity     Problem List Patient Active Problem List   Diagnosis Date Noted  . Goals of care, counseling/discussion 11/29/2019  . Malignant neoplasm of upper lobe of left lung ([place]) 11/29/2019  . Non-small cell cancer of left lung ([place]) 11/29/2019  . Hemoptysis 11/29/2019     Durwin Reges DPT [facility], SPT  Durwin Reges 11/03/2020, 12:54 PM  [place] [facility] [address] [place], [address] Phone: [phone]   Fax:  [phone]  Name: HAYWOOD MEINDERS MRN: [id number] Date of Birth: September 07, 1971

## 2020-11-05 ENCOUNTER — Encounter: Payer: Self-pay | Admitting: Physical Therapy

## 2020-11-05 ENCOUNTER — Ambulatory Visit: Payer: 59 | Admitting: Physical Therapy

## 2020-11-05 ENCOUNTER — Other Ambulatory Visit: Payer: Self-pay

## 2020-11-05 DIAGNOSIS — R293 Abnormal posture: Secondary | ICD-10-CM

## 2020-11-05 DIAGNOSIS — M25612 Stiffness of left shoulder, not elsewhere classified: Secondary | ICD-10-CM | POA: Diagnosis not present

## 2020-11-05 DIAGNOSIS — M25512 Pain in left shoulder: Secondary | ICD-10-CM

## 2020-11-05 NOTE — Therapy (Signed)
[place] [facility] [address] [place], [address] Phone: [phone]   Fax:  [phone]  Physical Therapy Treatment  Patient Details  Name: Kyle Guerrero MRN: [id number] Date of Birth: 1971/07/31 No data recorded  Encounter Date: 11/05/2020   PT End of Session - 11/05/20 1038    Visit Number 9    Number of Visits 17    Date for PT Re-Evaluation 11/27/20    PT Start Time 0945    PT Stop Time 1027    PT Time Calculation (min) 42 min    Activity Tolerance Patient tolerated treatment well    Behavior During Therapy [facility] for tasks assessed/performed           Past Medical History:  Diagnosis Date  . Asthma   . Lung cancer [facility])     Past Surgical History:  Procedure Laterality Date  . CYST EXCISION    . IR IMAGING GUIDED PORT INSERTION  12/05/2019  . VIDEO BRONCHOSCOPY WITH ENDOBRONCHIAL ULTRASOUND Left 11/22/2019   Procedure: VIDEO BRONCHOSCOPY WITH ENDOBRONCHIAL ULTRASOUND;  Surgeon: Tyler Pita, MD;  Location: [facility] ORS;  Service: Thoracic;  Laterality: Left;    There were no vitals filed for this visit.   Subjective Assessment - 11/05/20 0947    Subjective Pt reports his L shoulder is sore but he feels that it is geting better. Pt states the soreness is a 2/10.    Pertinent History Patient with c/o L shoulder pain and decreased function for multiple months prior to MRI of the shoulder/neck revealing mass partially encasing the left vertebral artery and abutting to the preforaminal segment; invading the vertebral bodies and edema of the left C5 articular pillar. Underwent radiation finishing on 09/04/20, with regaining of LUE function since. Currently undergoing chemo Infusion 1x every 3 weeks. Pertinent PMH of non-small cell lung cancer, in remission since summer 2021 following concurrent chemoradiation with carbotaxol chemotherapy followed by 2 cycles of carbotaxol Keytruda and was on maintenance  Keytruda. Reports initially he did not have any function in LUE and has been working on moving his LUE more. Reports he has some tingling in palmar side thumb, pointer finger, and middle finger; but reports normal sensations to hot/cold, sharp/dull, etc. Continues to have pain along medial border of L scapula that is aching in nature, that is always an 6/10, gets up to 8/10 with heavy lifting and when pressure is applied to ie (ie sleeping). 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 Patient enjoyed shooting pool and playing basketball prior to diagnosis. Patient is L handed and reports he cannot reach overhead with anything of weight, can bathe and groom hair at this point. Pt denies N/V, B&B changes, unexplained weight fluctuation, saddle paresthesia, fever, night sweats, or unrelenting night pain at this time.    Limitations House hold activities;Lifting    How long can you sit comfortably? no time    How long can you stand comfortably? no time    How long can you walk comfortably? no time    Patient Stated Goals Regain strength in L arm    Currently in Pain? No/denies    Pain Score 0-No pain    Pain Onset More than a month ago            Ther-Ex  - Arm bike L9> 54mins FWD 2ins BWD gentle  strengthening   - Total gym pull up L15 x10; L16 x10; L17 x8  - Elevated push up from table 3x10 with min cuing for even push through each UE  - Scaption 1# 3x 10 against half foam rolll with better carry over of technique this way  -Overhead press 2# 2x10 with cues for proper form  -Snow angels against half foam roll AROM for scapulohumeral rhythm 2x10   -Wall ball clockwise and counterclockwise with cues to keep elbows straight and resistance on ball 2x10                          PT Education - 11/05/20 1005    Education Details therex form/technique    Person(s) Educated  Patient    Methods Explanation;Demonstration;Verbal cues    Comprehension Returned demonstration;Verbal cues required;Verbalized understanding            PT Short Term Goals - 09/30/20 1153      PT SHORT TERM GOAL #1   Title Pt will be independent with HEP in order to improve strength and decrease pain in order to improve pain-free function at home and work.    Baseline 09/29/20 HEP given    Time 4    Period Weeks    Status New             PT Long Term Goals - 09/30/20 1153      PT LONG TERM GOAL #1   Title Patient will increase FOTO score to 63 to demonstrate predicted increase in functional mobility to complete ADLs    Baseline 09/29/20 48    Time 8    Period Weeks    Status New      PT LONG TERM GOAL #2   Title Pt will decrease worst pain as reported on NPRS by at least 3 points in order to demonstrate clinically significant reduction in pain.    Baseline 09/29/20 8/10    Time 8    Period Weeks    Status New      PT LONG TERM GOAL #3   Title Patient will demonstrate at least 4+/5 gross L shoulder and periscapular strength to demonstrate symmetrical PLOF and strength needed for heavy household chores    Baseline 09/29/20 L shoulder flex: 3+/5 ABD 3+/5 ER 3/5 IR 3+/5 Y 3-/5 T 3/5    Time 8    Period Weeks    Status New                 Plan - 11/05/20 1056    Clinical Impression Statement Pt continues to show an increase in L shoulder ROM and strength. Pt is able to reach overhead with full range but continues to have difficulty keeping arm up. PT session focused on normalizing scapulohumeral rhythm, strengthening, and scapular stability to be able to hold arm >90d of flexion and complete overhead activities with decreased difficulty. PT will continue to progress as able.    Personal Factors and Comorbidities Comorbidity 1;Fitness;Comorbidity 2;Profession;Time since onset of injury/illness/exacerbation;Past/Current Experience    Comorbidities asthma, non small  cell lung cancer    Examination-Activity Limitations Bathing;Reach Overhead;Lift;Sleep;Carry;Bed Mobility    Examination-Participation Restrictions Occupation;Community Activity;Meal Prep;Laundry;Cleaning    Stability/Clinical Decision Making Evolving/Moderate complexity    Clinical Decision Making Moderate    Rehab Potential Fair    PT Frequency 2x / week    PT Duration 8 weeks    PT Treatment/Interventions ADLs/Self Care Home Management;Cryotherapy;Iontophoresis 4mg /ml Dexamethasone;Functional  mobility training;Patient/family education;Manual techniques;Joint Manipulations;Spinal Manipulations;Dry needling;Manual lymph drainage;Taping;Therapeutic exercise;Gait training;Traction;Ultrasound;Electrical Stimulation;Moist Heat;DME Instruction;Therapeutic activities;Neuromuscular re-education    PT Next Visit Plan CKC, overhead mobility and strengthening    PT Home Exercise Plan rows, high rows, Prone T    Consulted and Agree with Plan of Care Patient           Patient will benefit from skilled therapeutic intervention in order to improve the following deficits and impairments:  Decreased mobility, Increased muscle spasms, Impaired sensation, Improper body mechanics, Impaired tone, Decreased range of motion, Decreased strength, Increased fascial restricitons, Impaired flexibility, Impaired UE functional use, Postural dysfunction, Pain, Decreased activity tolerance  Visit Diagnosis: Stiffness of left shoulder, not elsewhere classified  Abnormal posture  Left shoulder pain, unspecified chronicity     Problem List Patient Active Problem List   Diagnosis Date Noted  . Goals of care, counseling/discussion 11/29/2019  . Malignant neoplasm of upper lobe of left lung ([place]) 11/29/2019  . Non-small cell cancer of left lung ([place]) 11/29/2019  . Hemoptysis 11/29/2019    Durwin Reges DPT [facility], SPT Durwin Reges 11/05/2020, 12:54 PM  [place]  [facility] [address], [place], [address] Phone: [phone]   Fax:  [phone]  Name: Kyle Guerrero MRN: [id number] Date of Birth: 07/12/1971

## 2020-11-10 ENCOUNTER — Other Ambulatory Visit: Payer: Self-pay | Admitting: *Deleted

## 2020-11-10 ENCOUNTER — Telehealth: Payer: Self-pay | Admitting: *Deleted

## 2020-11-10 ENCOUNTER — Other Ambulatory Visit: Payer: Self-pay

## 2020-11-10 ENCOUNTER — Ambulatory Visit
Admission: RE | Admit: 2020-11-10 | Discharge: 2020-11-10 | Disposition: A | Discharge status: Home / Self Care | Payer: 59 | Source: Ambulatory Visit | Attending: Oncology | Admitting: Oncology

## 2020-11-10 ENCOUNTER — Ambulatory Visit: Payer: 59

## 2020-11-10 ENCOUNTER — Ambulatory Visit: Payer: 59 | Admitting: Physical Therapy

## 2020-11-10 DIAGNOSIS — R9389 Abnormal findings on diagnostic imaging of other specified body structures: Secondary | ICD-10-CM

## 2020-11-10 DIAGNOSIS — C3490 Malignant neoplasm of unspecified part of unspecified bronchus or lung: Secondary | ICD-10-CM

## 2020-11-10 DIAGNOSIS — C3412 Malignant neoplasm of upper lobe, left bronchus or lung: Secondary | ICD-10-CM

## 2020-11-10 IMAGING — CT CT CHEST-ABD-PELV W/ CM
2 of 5 series · 13 of 36 positions shown, 15 images · IV contrast (omnipaque)
Comparison: PET-CT on [DATE]

CLINICAL DATA: Follow-up left lung non-small-cell carcinoma.
Previous chemotherapy and radiation therapy. Ongoing immunotherapy.
Restaging.

EXAM:
CT CHEST, ABDOMEN, AND PELVIS WITH CONTRAST
TECHNIQUE: Multidetector CT imaging of the chest, abdomen and pelvis was
performed following the standard protocol during bolus
administration of intravenous contrast.
CONTRAST:  100mL OMNIPAQUE IOHEXOL 300 MG/ML  SOLN

[Series 3: cap with 5.00 ax · axial · 0.63mm/px · z∈[-1243,-698]mm · 10 of 135 slices shown, 12 images]
[im 13/135  mediastinal]
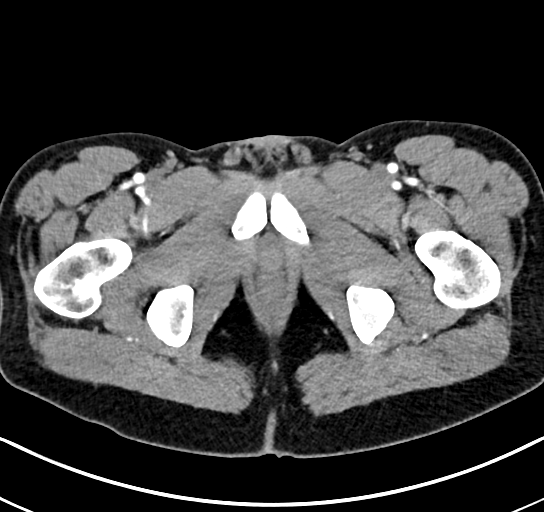
[im 13/135  bone]
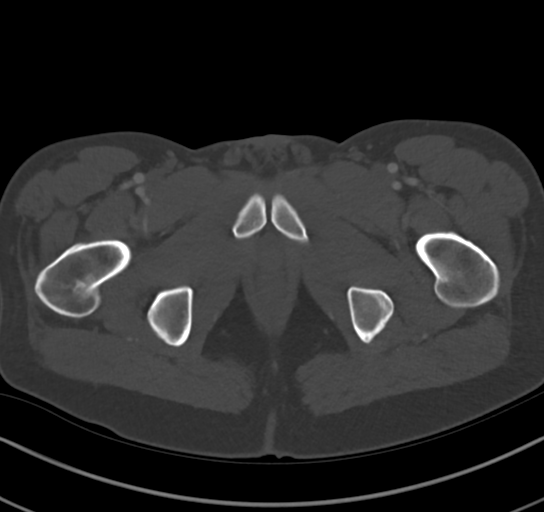
[im 25/135  mediastinal]
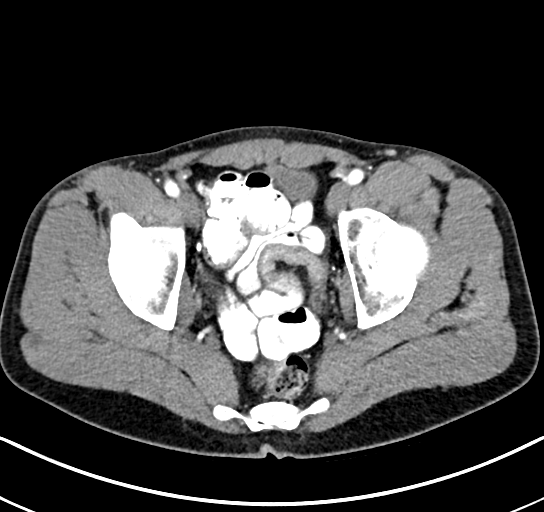
[im 37/135  mediastinal]
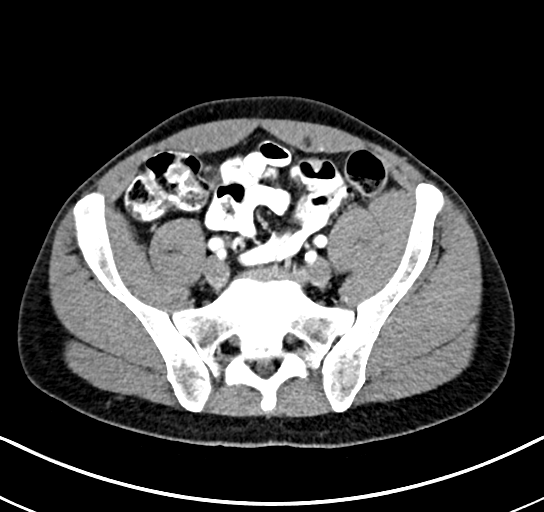
[im 49/135  mediastinal]
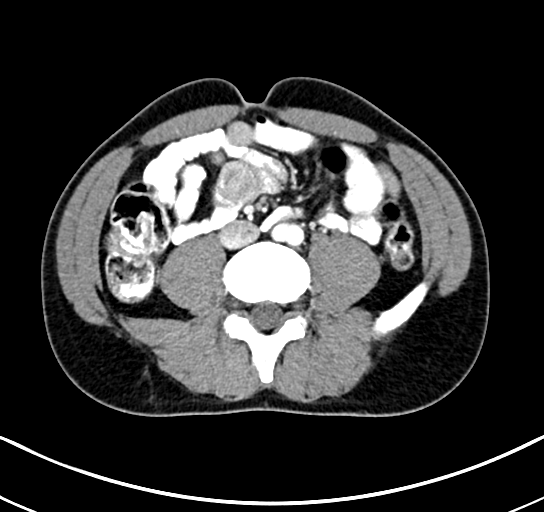
[im 61/135  mediastinal]
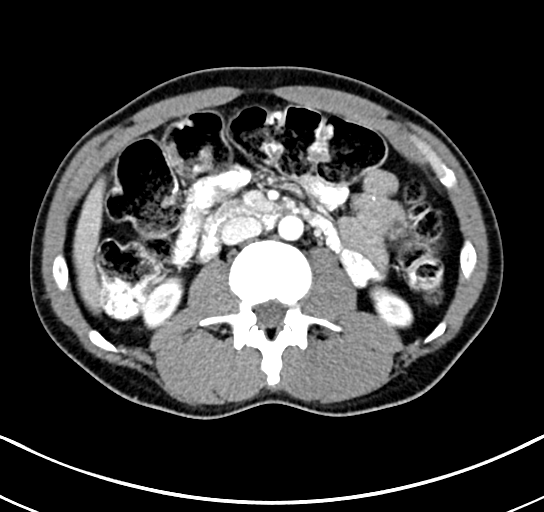
[im 74/135  mediastinal]
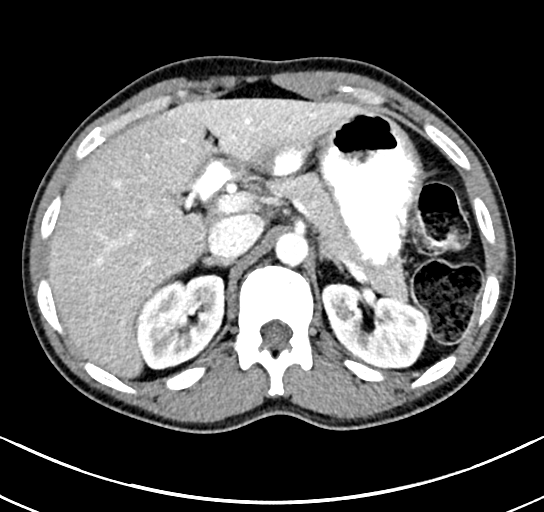
[im 86/135  mediastinal]
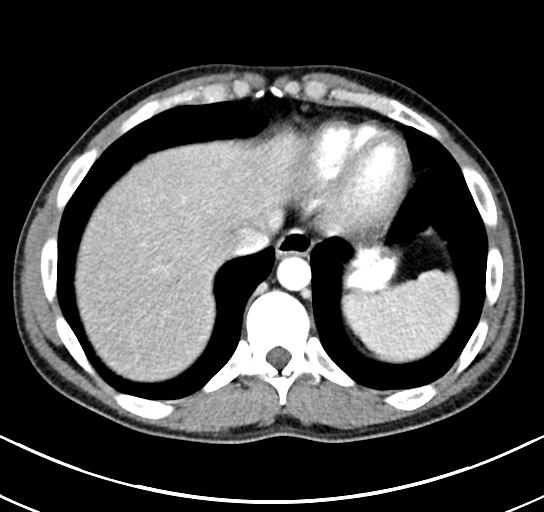
[im 98/135  mediastinal]
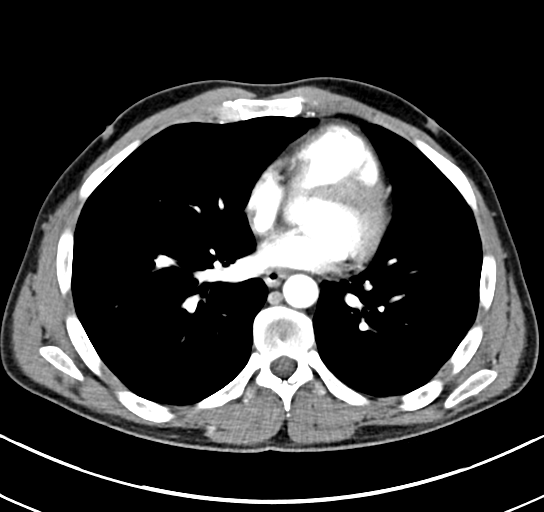
[im 110/135  mediastinal]
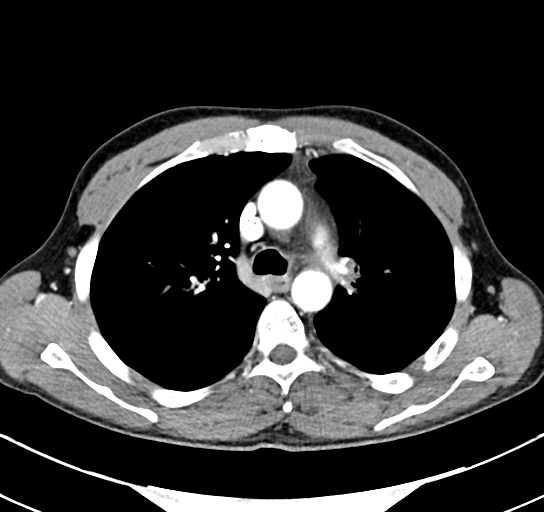
[im 110/135  bone]
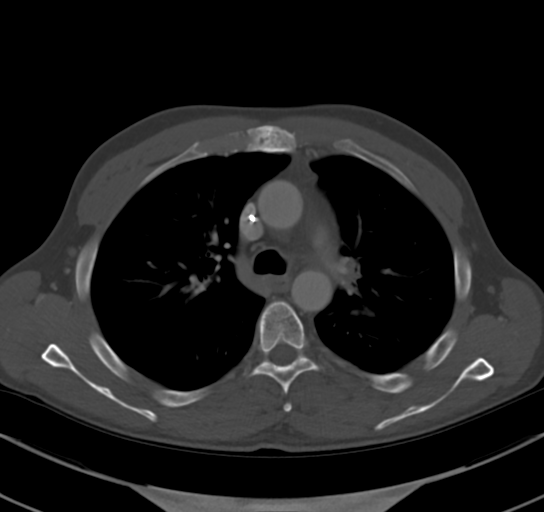
[im 122/135  mediastinal]
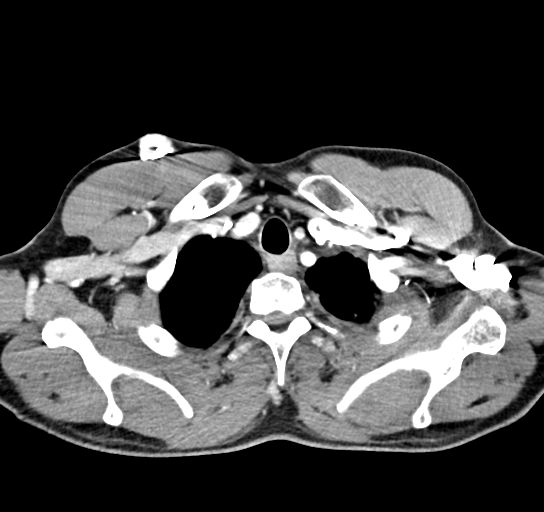

[Series 7: coronal cap with 2.00 cor · coronal · 0.67mm/px · 3 of 131 slices shown]
[im 27/131  mediastinal]
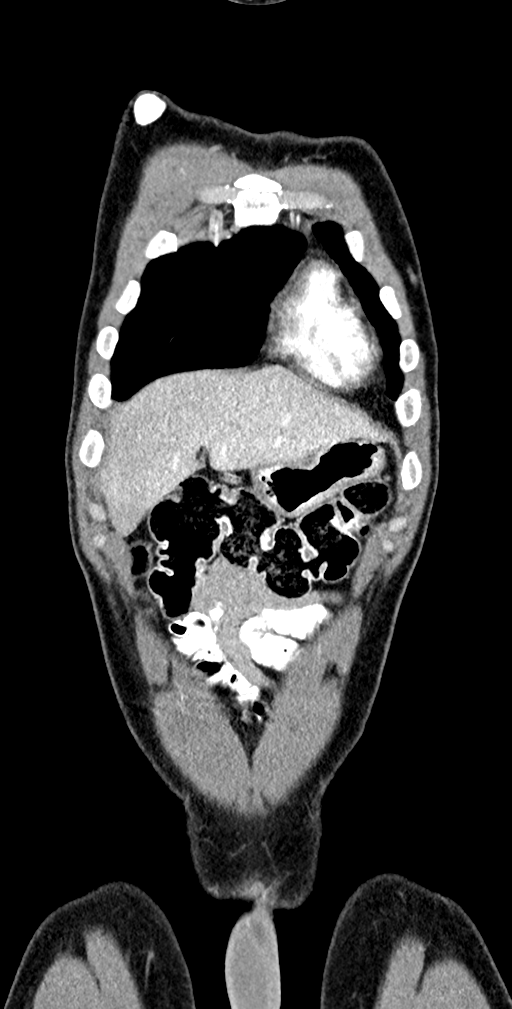
[im 53/131  mediastinal]
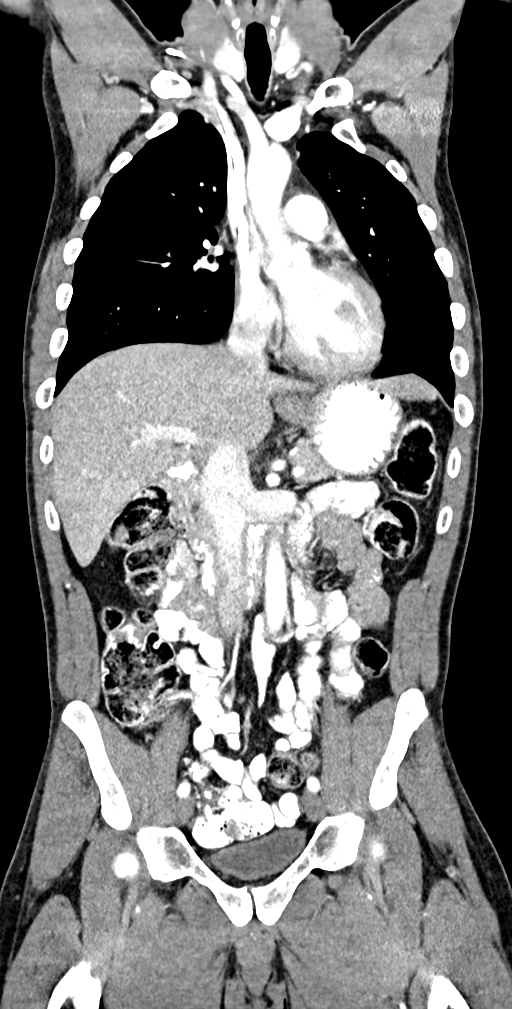
[im 79/131  mediastinal]
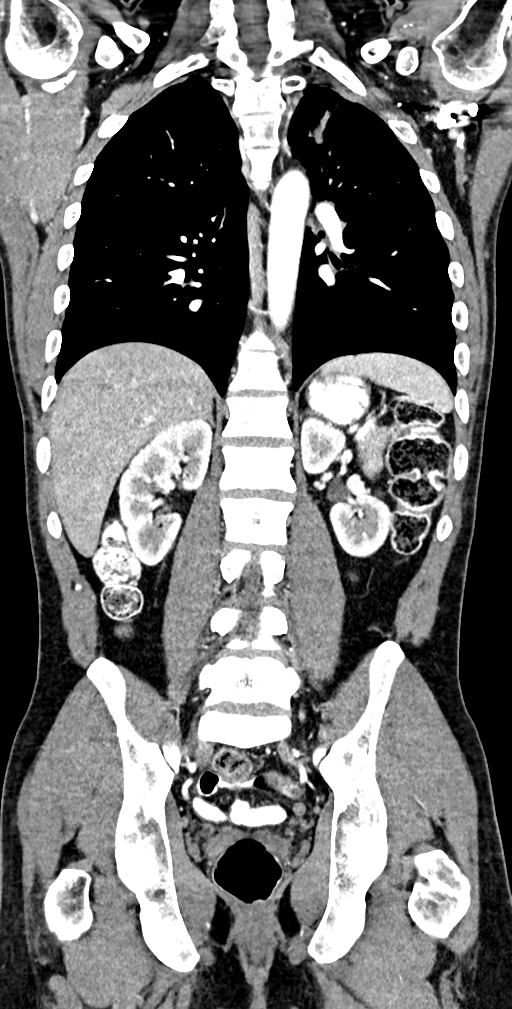

[13 of 36 positions shown; findings below may reference images not displayed]

FINDINGS: CT CHEST FINDINGS

Cardiovascular: No acute findings.

Mediastinum/Lymph Nodes: Site of previously seen mild left
supraclavicular lymphadenopathy is now obscured by beam hardening
artifact from injected IV contrast in the left subclavian vein. No
other masses or sites of lymphadenopathy are identified.

Lungs/Pleura: Solid nodule in the medial left lung apex measures
x 1.1 cm on image 45/5, and shows no significant change compared to
prior study. Multifocal ill-defined areas of airspace opacity in the
posterior left upper lobe and central left upper lobe are unchanged
and are likely due to post radiation changes. No new or enlarging
pulmonary nodules or masses identified. No evidence of pleural
effusion.

Musculoskeletal:  No suspicious bone lesions identified.

CT ABDOMEN AND PELVIS FINDINGS

Hepatobiliary: No masses identified. Gallbladder is unremarkable. No
evidence of biliary ductal dilatation.

Pancreas:  No mass or inflammatory changes.

Spleen:  Within normal limits in size and appearance.

Adrenals/Urinary tract:  No masses or hydronephrosis.

Stomach/Bowel: No evidence of obstruction, inflammatory process, or
abnormal fluid collections. Normal appendix visualized.

Vascular/Lymphatic: No pathologically enlarged lymph nodes
identified. No abdominal aortic aneurysm.

Reproductive:  No mass or other significant abnormality identified.

Other:  None.

Musculoskeletal:  No suspicious bone lesions identified.
IMPRESSION: Site of previously seen mild left supraclavicular lymphadenopathy is
obscured by artifact from injected IV contrast in the left
subclavian vein on today's study. No other sites of lymphadenopathy
identified.

Stable left upper lobe pulmonary nodule. Stable probable post
radiation changes in left upper and superior lower lobes.

No new or progressive metastatic disease identified within the
chest, abdomen, or pelvis.

## 2020-11-10 IMAGING — CT CT NECK W/ CM
4 of 5 series · 14 of 33 positions shown, 16 images · IV contrast (omnipaque)
Comparison: [DATE]

CLINICAL DATA: Lung cancer spread to the right cervical region with
nonuse of the right arm.

EXAM:
CT NECK WITH CONTRAST
TECHNIQUE: Multidetector CT imaging of the neck was performed using the
standard protocol following the bolus administration of intravenous
contrast.
CONTRAST:  100mL OMNIPAQUE IOHEXOL 300 MG/ML  SOLN

[Series 3: axials neck 2.00 · axial · 0.49mm/px · z∈[-708,-578]mm · 3 of 131 slices shown]
[im 33/131  bone]
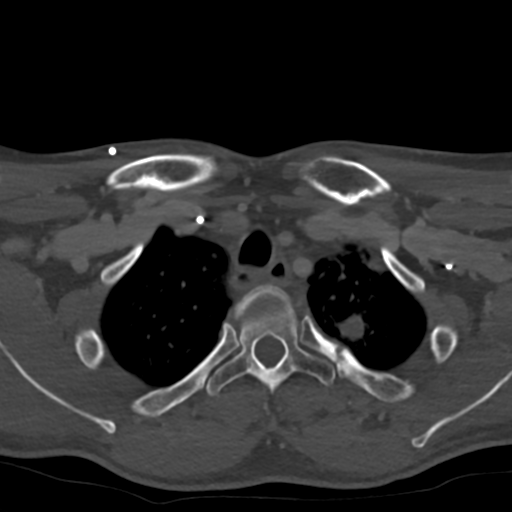
[im 66/131  bone]
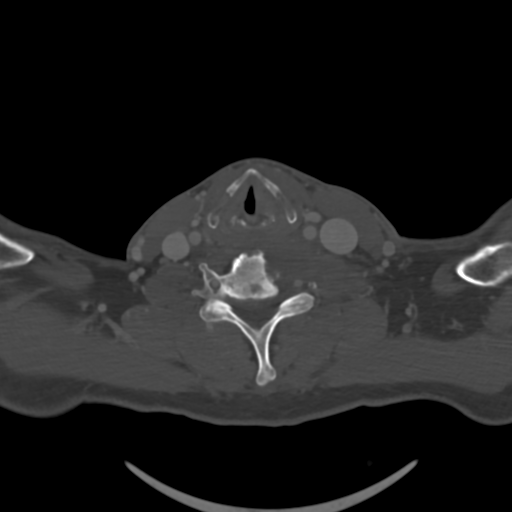
[im 98/131  bone]
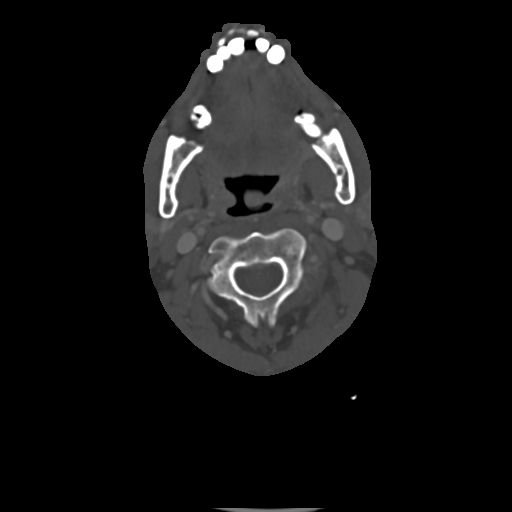

[Series 5: coronals neck 2.00 cor · coronal · 0.49mm/px · 3 of 126 slices shown]
[im 33/126  bone]
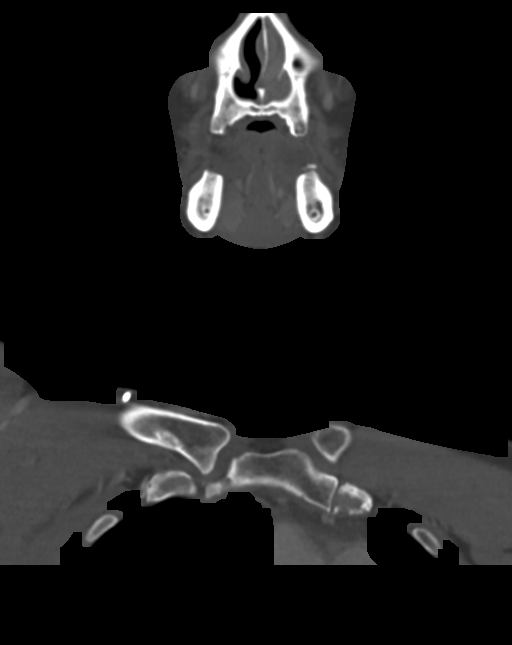
[im 53/126  bone]
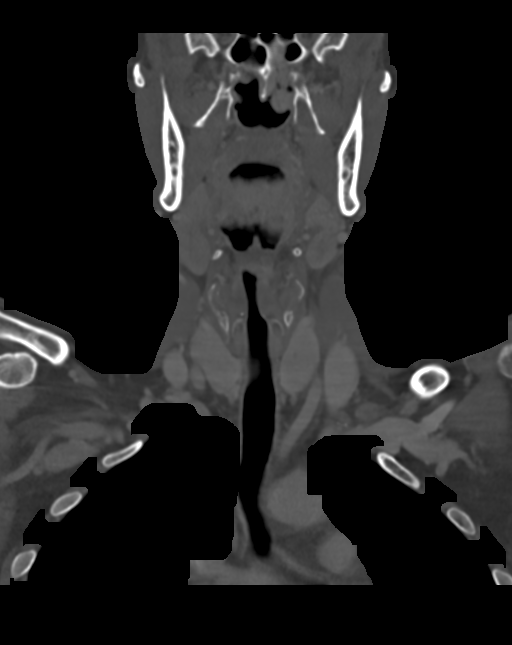
[im 73/126  bone]
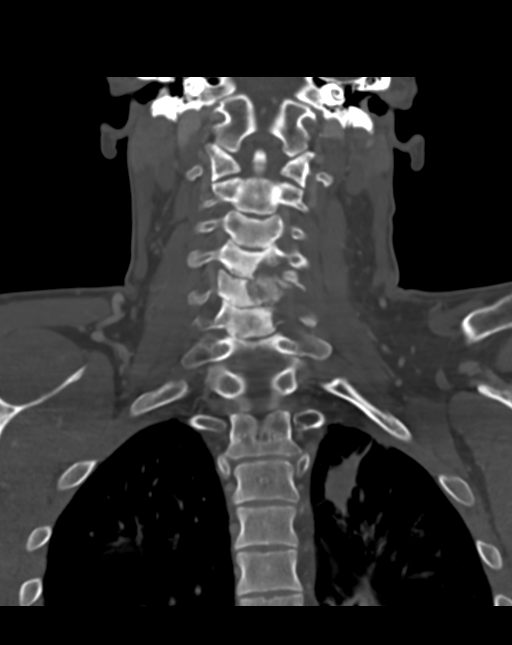

[Series 7: sagittals neck 2.00 sag · sagittal · 0.49mm/px · 5 of 126 slices shown, 6 images]
[im 42/126  bone]
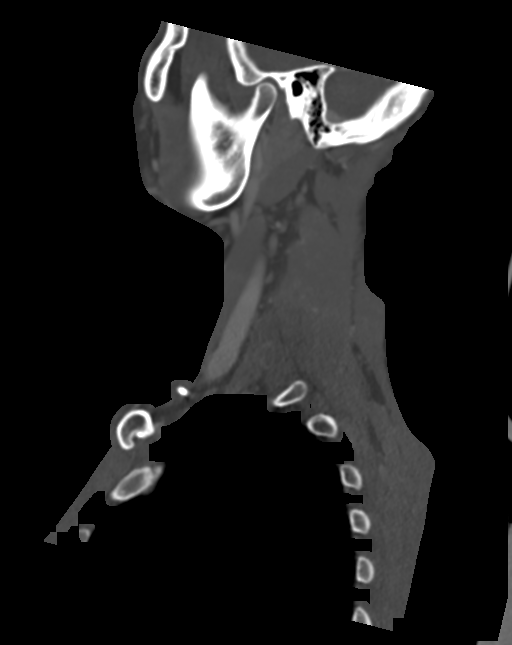
[im 53/126  bone]
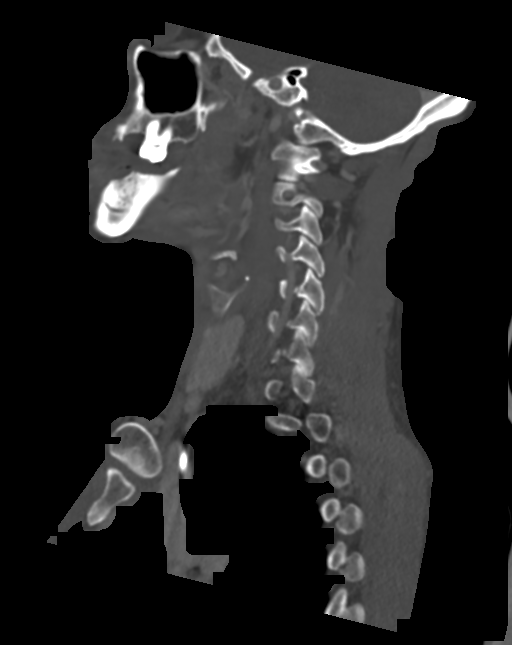
[im 63/126  soft-tissue]
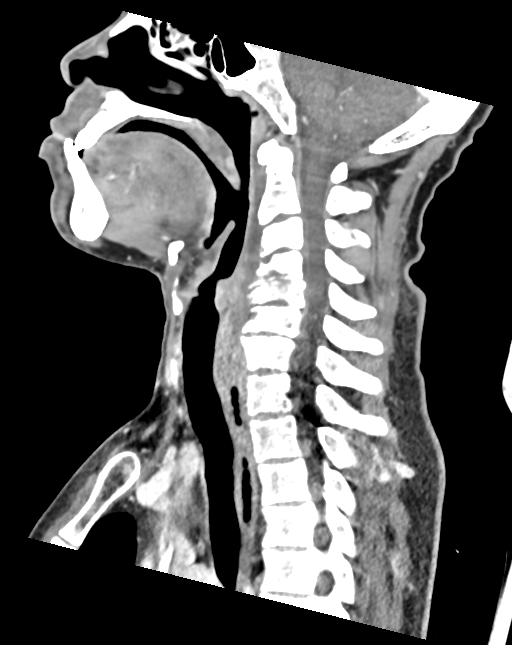
[im 63/126  bone]
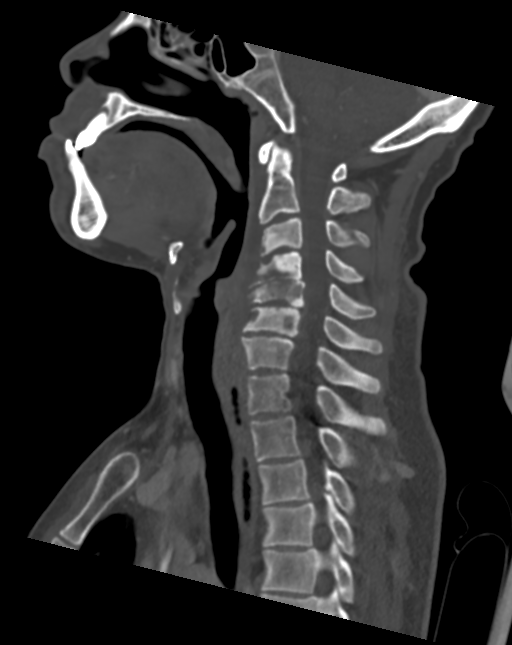
[im 73/126  bone]
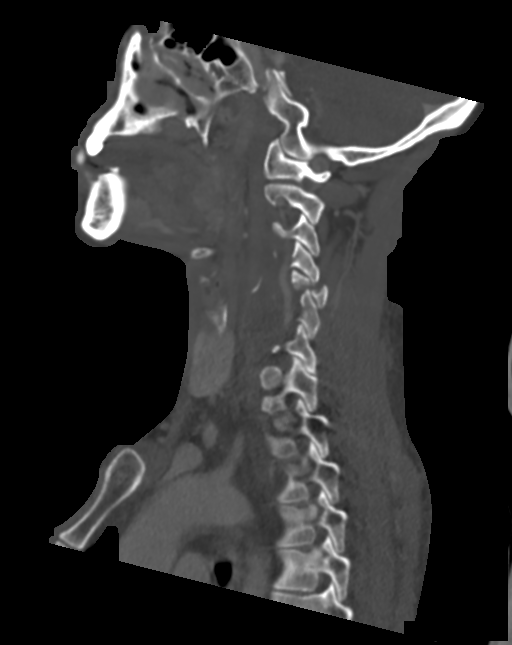
[im 84/126  bone]
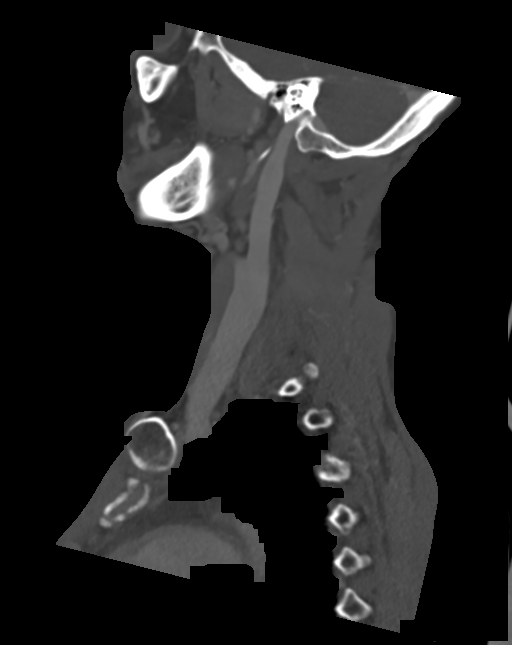

[Series 9: ax oropharynx neck 2.00 ax · axial · 0.49mm/px · z∈[-751,-598]mm · 3 of 159 slices shown, 4 images]
[im 40/159  soft-tissue]
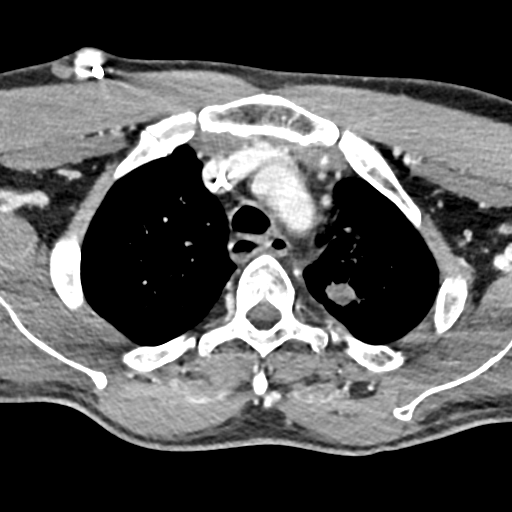
[im 40/159  bone]
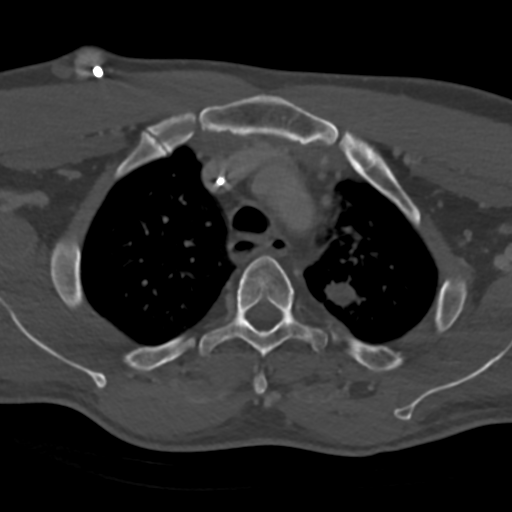
[im 80/159  bone]
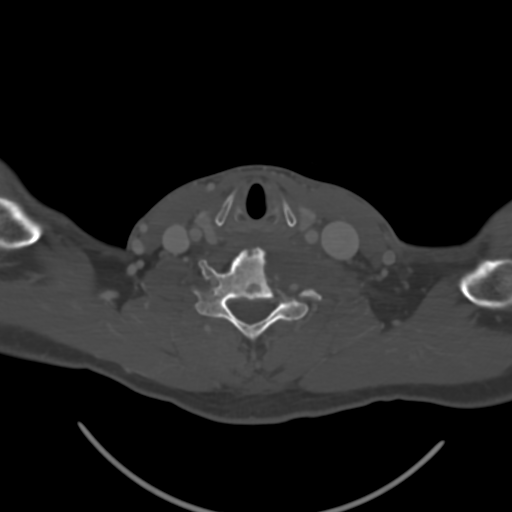
[im 119/159  bone]
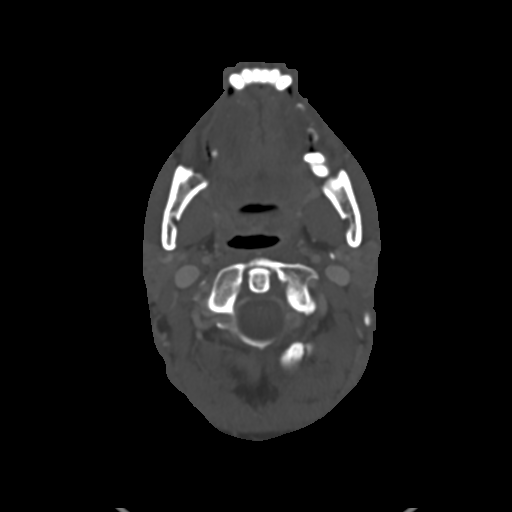

[14 of 33 positions shown; findings below may reference images not displayed]

FINDINGS: Pharynx and larynx: Prominent mucosal enhancement presumably from
radiotherapy. There is medialization of the left focal fold,
suspected paresis.

Salivary glands: Dense appearance, likely radiation related.

Thyroid: Normal

Lymph nodes: Left supraclavicular node on [DATE], significantly
improved from prior, now 8 mm in short axis. No newly enlarged lymph
node.

Vascular: Porta catheter on the right in good position.

Limited intracranial: Negative

Visualized orbits: Negative

Mastoids and visualized paranasal sinuses: Clear.

Skeleton: Known extensive malignancy invading the cervical spine
with left eccentric vertebral body erosion and sclerosis at C3-C6.
There is involvement of the left C5 articular process based on
comparison MRI, with pathologic fracture seen today but not
previously. The mass is indistinguishable from left prevertebral
musculature, but has significantly decreased in size from comparison
PET-CT [DATE]. no right-sided supraclavicular involvement seen
to explain history of right arm dysfunction. Mild retropharyngeal
edematous appearance which is likely treatment related.

Upper chest: Reported separately

Other: These results will be called to the ordering clinician or
representative by the Radiologist Assistant, and communication
documented in the PACS or [REDACTED].
IMPRESSION: 1. Marked reduction in the left eccentric cervical and prevertebral
mass after therapy. An involved left supraclavicular node has also
notably decreased in size.
2. There is a pathologic fracture of the left C5 articular process
which is new from outside cervical MRI [DATE].
[DATE]. Probable left vocal cord paresis.
4. Chest CT reported separately.

## 2020-11-10 MED ORDER — IOHEXOL 300 MG/ML  SOLN
100.0000 mL | Freq: Once | INTRAMUSCULAR | Status: AC | PRN
  Administered 2020-11-10: 100 mL via INTRAVENOUS

## 2020-11-10 NOTE — Telephone Encounter (Signed)
Called report  IMPRESSION: 1. Marked reduction in the left eccentric cervical and prevertebral mass after therapy. An involved left supraclavicular node has also notably decreased in size. 2. There is a pathologic fracture of the left C5 articular process which is new from outside cervical MRI 08/05/2020. 3. 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 4. Chest CT reported separately.   Electronically Signed   By: Monte Fantasia M.D.   On: 11/10/2020 10:16

## 2020-11-12 ENCOUNTER — Ambulatory Visit: Payer: 59 | Admitting: Physical Therapy

## 2020-11-17 ENCOUNTER — Ambulatory Visit: Payer: 59 | Admitting: Physical Therapy

## 2020-11-27 ENCOUNTER — Inpatient Hospital Stay: Payer: Medicaid Other | Attending: Oncology

## 2020-11-27 ENCOUNTER — Inpatient Hospital Stay: Payer: Medicaid Other

## 2020-11-27 ENCOUNTER — Inpatient Hospital Stay (HOSPITAL_BASED_OUTPATIENT_CLINIC_OR_DEPARTMENT_OTHER): Payer: Medicaid Other | Admitting: Oncology

## 2020-11-27 VITALS — BP 124/85 | HR 94 | Temp 97.1°F | Resp 16 | Wt 172.6 lb

## 2020-11-27 DIAGNOSIS — Z79899 Other long term (current) drug therapy: Secondary | ICD-10-CM | POA: Insufficient documentation

## 2020-11-27 DIAGNOSIS — Z5111 Encounter for antineoplastic chemotherapy: Secondary | ICD-10-CM | POA: Diagnosis not present

## 2020-11-27 DIAGNOSIS — Z923 Personal history of irradiation: Secondary | ICD-10-CM | POA: Insufficient documentation

## 2020-11-27 DIAGNOSIS — M25512 Pain in left shoulder: Secondary | ICD-10-CM | POA: Insufficient documentation

## 2020-11-27 DIAGNOSIS — Z9221 Personal history of antineoplastic chemotherapy: Secondary | ICD-10-CM | POA: Diagnosis not present

## 2020-11-27 DIAGNOSIS — C3412 Malignant neoplasm of upper lobe, left bronchus or lung: Secondary | ICD-10-CM

## 2020-11-27 DIAGNOSIS — C782 Secondary malignant neoplasm of pleura: Secondary | ICD-10-CM | POA: Insufficient documentation

## 2020-11-27 DIAGNOSIS — E538 Deficiency of other specified B group vitamins: Secondary | ICD-10-CM | POA: Insufficient documentation

## 2020-11-27 DIAGNOSIS — Z87891 Personal history of nicotine dependence: Secondary | ICD-10-CM | POA: Diagnosis not present

## 2020-11-27 LAB — CBC WITH DIFFERENTIAL/PLATELET
Abs Immature Granulocytes: 0.04 10*3/uL (ref 0.00–0.07)
Basophils Absolute: 0 10*3/uL (ref 0.0–0.1)
Basophils Relative: 1 %
Eosinophils Absolute: 0.1 10*3/uL (ref 0.0–0.5)
Eosinophils Relative: 3 %
HCT: 35.5 % — ABNORMAL LOW (ref 39.0–52.0)
Hemoglobin: 11.7 g/dL — ABNORMAL LOW (ref 13.0–17.0)
Immature Granulocytes: 1 %
Lymphocytes Relative: 20 %
Lymphs Abs: 0.9 10*3/uL (ref 0.7–4.0)
MCH: 31.8 pg (ref 26.0–34.0)
MCHC: 33 g/dL (ref 30.0–36.0)
MCV: 96.5 fL (ref 80.0–100.0)
Monocytes Absolute: 1.1 10*3/uL — ABNORMAL HIGH (ref 0.1–1.0)
Monocytes Relative: 24 %
Neutro Abs: 2.3 10*3/uL (ref 1.7–7.7)
Neutrophils Relative %: 51 %
Platelets: 272 10*3/uL (ref 150–400)
RBC: 3.68 MIL/uL — ABNORMAL LOW (ref 4.22–5.81)
RDW: 16.9 % — ABNORMAL HIGH (ref 11.5–15.5)
WBC: 4.4 10*3/uL (ref 4.0–10.5)
nRBC: 0 % (ref 0.0–0.2)

## 2020-11-27 LAB — COMPREHENSIVE METABOLIC PANEL
ALT: 40 U/L (ref 0–44)
AST: 27 U/L (ref 15–41)
Albumin: 3.8 g/dL (ref 3.5–5.0)
Alkaline Phosphatase: 82 U/L (ref 38–126)
Anion gap: 10 (ref 5–15)
BUN: 17 mg/dL (ref 6–20)
CO2: 25 mmol/L (ref 22–32)
Calcium: 9 mg/dL (ref 8.9–10.3)
Chloride: 96 mmol/L — ABNORMAL LOW (ref 98–111)
Creatinine, Ser: 0.99 mg/dL (ref 0.61–1.24)
GFR, Estimated: >60 mL/min (ref >60)
Glucose, Bld: 144 mg/dL — ABNORMAL HIGH (ref 70–99)
Potassium: 3.9 mmol/L (ref 3.5–5.1)
Sodium: 131 mmol/L — ABNORMAL LOW (ref 135–145)
Total Bilirubin: 0.6 mg/dL (ref 0.3–1.2)
Total Protein: 7.6 g/dL (ref 6.5–8.1)

## 2020-11-27 MED ORDER — HEPARIN SOD (PORK) LOCK FLUSH 100 UNIT/ML IV SOLN
INTRAVENOUS | Status: AC
  Filled 2020-11-27: qty 5

## 2020-11-27 MED ORDER — HEPARIN SOD (PORK) LOCK FLUSH 100 UNIT/ML IV SOLN
500.0000 [IU] | Freq: Once | INTRAVENOUS | Status: AC | PRN
  Administered 2020-11-27: 500 [IU]
  Filled 2020-11-27: qty 5

## 2020-11-27 MED ORDER — HEPARIN SOD (PORK) LOCK FLUSH 100 UNIT/ML IV SOLN
500.0000 [IU] | Freq: Once | INTRAVENOUS | Status: DC
  Filled 2020-11-27: qty 5

## 2020-11-27 MED ORDER — SODIUM CHLORIDE 0.9 % IV SOLN
10.0000 mg | Freq: Once | INTRAVENOUS | Status: AC
  Administered 2020-11-27: 10 mg via INTRAVENOUS
  Filled 2020-11-27: qty 10

## 2020-11-27 MED ORDER — PEGFILGRASTIM 6 MG/0.6ML ~~LOC~~ PSKT: 6.0000 mg | PREFILLED_SYRINGE | Freq: Once | SUBCUTANEOUS | Status: DC

## 2020-11-27 MED ORDER — SODIUM CHLORIDE 0.9 % IV SOLN
Freq: Once | INTRAVENOUS | Status: AC
  Filled 2020-11-27: qty 250

## 2020-11-27 MED ORDER — SODIUM CHLORIDE 0.9 % IV SOLN: 150.0000 mg | Freq: Once | INTRAVENOUS | Status: DC

## 2020-11-27 MED ORDER — SODIUM CHLORIDE 0.9 % IV SOLN
500.0000 mg/m2 | Freq: Once | INTRAVENOUS | Status: AC
  Administered 2020-11-27: 1000 mg via INTRAVENOUS
  Filled 2020-11-27: qty 40

## 2020-11-27 MED ORDER — CYANOCOBALAMIN 1000 MCG/ML IJ SOLN
1000.0000 ug | Freq: Once | INTRAMUSCULAR | Status: AC
  Administered 2020-11-27: 1000 ug via INTRAMUSCULAR
  Filled 2020-11-27: qty 1

## 2020-11-27 MED ORDER — SODIUM CHLORIDE 0.9% FLUSH
10.0000 mL | INTRAVENOUS | Status: DC | PRN
  Administered 2020-11-27: 10 mL via INTRAVENOUS
  Filled 2020-11-27: qty 10

## 2020-11-27 NOTE — Progress Notes (Signed)
Stable at discharge 

## 2020-11-27 NOTE — Progress Notes (Signed)
Hematology/Oncology Consult note [facility]  [phone] [phone]  Patient Care Team: Default, Provider, MD as PCP - General Telford Nab, RN as Registered Nurse Sindy Guadeloupe, MD as Consulting Physician (Hematology and Oncology) Sindy Guadeloupe, MD as Consulting Physician (Hematology and Oncology)   Name of the patient: Kyle Guerrero  [id number]  1971/09/23   Date of visit: 11/27/20  Diagnosis- Non-small cell lung cancer stage IV acT2 cN2 cM1 a with pleural involvement   Chief complaint/ Reason for visit-on treatment assessment prior to single agent [name] chemotherapy  Heme/Onc history: patient is a 49 year old male who presented to the ER with symptoms ofheaviness in his chest and upper left chest discomfort he underwent CT angio chest to rule out PE which showed 3.8 x 3.3 cm left upper palpable lung mass along with 4.4 x 3.3 cm lobulated mass in the aortopulmonary window and 2.6 x 2.4 cm left hilar mass all concerning for malignancy. Patient has also seen pulmonary and has been set up for bronchoscopy and EBUS guided biopsy on 11/23/2019. Patient underwent. PET CT scan which showed a hypermetabolic spiculated 3.5 cm of 5 left upper lobe lung mass, adjacent hypermetabolic 3.2 x 1.2 cm pleural metastases in the medial posterior by the left pleural space along with scalloping of the adjacent posterior left third rib. Hypermetabolic infiltrative left perihilar conglomerate nodal metastases measuring up to 7.3 x 3.6 cm and 0.8 cm high left mediastinal node between the left brachiocephalic vein and left subclavian artery. No evidence of distant metastatic disease  Biopsy showed non-small cell lung cancer but further characterization could not be determined. Insufficient tissue for NGS testing. Repeat biopsy done. Results of NGStestingshowed PD-L1 50%. Tumor mutational burden high. 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  Patient completed concurrent chemoradiation with carbotaxol chemotherapy followed by 2 cycles of carbotaxol Keytruda andwason maintenance Keytruda  Patient was complaining of left shoulder pain and underwent MRI of the shoulder which showed8 in the supraspinatus tendon. He was subsequently seen by [facility] and underwent MRI of the cervical spine which showed a 4.6 x 4.8 x 8.2 cm mass in the left prevertebral region from C2-C7 levels. The mass partially encases the left vertebral artery and abuts the preforaminal segment. Invades the vertebral bodies and edema of the left C5 articular pillar. There is slight extension into the left C4-C5 thecal sac without central thecal sac compression. The mass displaces the left pharynx anteriorly along the left carotid sheath.Patient had a repeat biopsy which was consistent with adenocarcinoma. Patient is undergoing palliative radiation treatment and patient received 4 cycles of carbo [name] chemotherapy with good response to treatment.  Repeat NGS testing on the tumor biopsy did not show any actionable mutations  Interval history-he is continuing physical therapy and home exercises which is helping him build up his upper extremity strength.  Denies any significant pain  ECOG PS- 0 Pain scale- 0 Opioid associated constipation- no  Review of systems- Review of Systems  Constitutional: Negative for chills, fever, malaise/fatigue and weight loss.  HENT: Negative for congestion, ear discharge and nosebleeds.   Eyes: Negative for blurred vision.  Respiratory: Negative for cough, hemoptysis, sputum production, shortness of breath and wheezing.   Cardiovascular: Negative for chest pain, palpitations, orthopnea and claudication.  Gastrointestinal: Negative for abdominal pain, blood in stool, constipation, diarrhea, heartburn, melena, nausea and vomiting.  Genitourinary: Negative for dysuria, flank pain, frequency,  hematuria and urgency.  Musculoskeletal: Negative  for back pain, joint pain and myalgias.       Left upper extremity weakness  Skin: Negative for rash.  Neurological: Negative for dizziness, tingling, focal weakness, seizures, weakness and headaches.  Endo/Heme/Allergies: Does not bruise/bleed easily.  Psychiatric/Behavioral: Negative for depression and suicidal ideas. The patient does not have insomnia.       No Known Allergies   Past Medical History:  Diagnosis Date  . Asthma   . Lung cancer [facility])      Past Surgical History:  Procedure Laterality Date  . CYST EXCISION    . IR IMAGING GUIDED PORT INSERTION  12/05/2019  . VIDEO BRONCHOSCOPY WITH ENDOBRONCHIAL ULTRASOUND Left 11/22/2019   Procedure: VIDEO BRONCHOSCOPY WITH ENDOBRONCHIAL ULTRASOUND;  Surgeon: Tyler Pita, MD;  Location: [facility] ORS;  Service: Thoracic;  Laterality: Left;    Social History   Socioeconomic History  . Marital status: Single    Spouse name: Not on file  . Number of children: Not on file  . Years of education: Not on file  . Highest education level: Not on file  Occupational History  . Not on file  Tobacco Use  . Smoking status: Former Smoker    Packs/day: 2.00    Types: Cigarettes    Quit date: 11/08/2019    Years since quitting: 1.0  . Smokeless tobacco: Never Used  Vaping Use  . Vaping Use: Never used  Substance and Sexual Activity  . Alcohol use: Not Currently  . Drug use: Yes    Types: Marijuana  . Sexual activity: Not on file  Other Topics Concern  . Not on file  Social History Narrative  . Not on file   Social Determinants of Health   Financial Resource Strain:   . Difficulty of Paying Living Expenses: Not on file  Food Insecurity:   . Worried About Charity fundraiser in the Last Year: Not on file  . Ran Out of Food in the Last Year: Not on file  Transportation Needs:   . Lack of Transportation (Medical): Not on file  . Lack of Transportation (Non-Medical): Not  on file  Physical Activity:   . Days of Exercise per Week: Not on file  . Minutes of Exercise per Session: Not on file  Stress:   . Feeling of Stress : Not on file  Social Connections:   . Frequency of Communication with Friends and Family: Not on file  . Frequency of Social Gatherings with Friends and Family: Not on file  . Attends Religious Services: Not on file  . Active Member of Clubs or Organizations: Not on file  . Attends Archivist Meetings: Not on file  . Marital Status: Not on file  Intimate Partner Violence:   . Fear of Current or Ex-Partner: Not on file  . Emotionally Abused: Not on file  . Physically Abused: Not on file  . Sexually Abused: Not on file    Family History  Problem Relation Age of Onset  . Healthy Mother   . Healthy Father      Current Outpatient Medications:  .  azelastine (ASTELIN) 0.1 % nasal spray, Place 1 spray into the nose as needed.  (Patient not taking: Reported on 10/23/2020), Disp: , Rfl:  .  folic acid (FOLVITE) 1 MG tablet, Take 1 tablet (1 mg total) by mouth daily. Start 5-7 days before [name] chemotherapy. Continue until 21 days after [name] completed. (Patient not taking: Reported on 10/23/2020), Disp: 100 tablet, Rfl: 3 .  lidocaine-prilocaine (EMLA) cream, Apply to affected area once (Patient not taking: Reported on 10/23/2020), Disp: 30 g, Rfl: 3 .  ondansetron (ZOFRAN) 8 MG tablet, Take 1 tablet (8 mg total) by mouth 2 (two) times daily as needed (Nausea or vomiting). Start if needed on the third day after chemotherapy. (Patient not taking: Reported on 08/21/2020), Disp: 30 tablet, Rfl: 1 .  oxyCODONE (OXY IR/ROXICODONE) 5 MG immediate release tablet, Take 1 tablet (5 mg total) by mouth every 4 (four) hours as needed for moderate pain or severe pain. (Patient not taking: Reported on 10/23/2020), Disp: 180 tablet, Rfl: 0 .  prochlorperazine (COMPAZINE) 10 MG tablet, Take 1 tablet (10 mg total) by mouth every 6 (six) hours as  needed (Nausea or vomiting). (Patient not taking: Reported on 08/21/2020), Disp: 30 tablet, Rfl: 1 No current facility-administered medications for this visit.  Facility-Administered Medications Ordered in Other Visits:  .  heparin lock flush 100 unit/mL, 500 Units, Intravenous, Once, Randa Evens C, MD .  sodium chloride flush (NS) 0.9 % injection 10 mL, 10 mL, Intravenous, PRN, Sindy Guadeloupe, MD, 10 mL at 05/01/20 0900 .  sodium chloride flush (NS) 0.9 % injection 10 mL, 10 mL, Intravenous, PRN, Sindy Guadeloupe, MD  Physical exam:  Vitals:   11/27/20 0849  BP: 124/85  Pulse: 94  Resp: 16  Temp: (!) 97.1 F (36.2 C)  TempSrc: Tympanic  SpO2: 100%  Weight: 172 lb 9.6 oz (78.3 kg)   Physical Exam HENT:     Head: Normocephalic and atraumatic.  Eyes:     Pupils: Pupils are equal, round, and reactive to light.  Cardiovascular:     Rate and Rhythm: Normal rate and regular rhythm.     Heart sounds: Normal heart sounds.  Pulmonary:     Effort: Pulmonary effort is normal.     Breath sounds: Normal breath sounds.  Abdominal:     General: Bowel sounds are normal.     Palpations: Abdomen is soft.  Musculoskeletal:     Cervical back: Normal range of motion.  Skin:    General: Skin is warm and dry.  Neurological:     Mental Status: He is alert and oriented to person, place, and time.      CMP Latest Ref Rng & Units 10/30/2020  Glucose 70 - 99 mg/dL 167(H)  BUN 6 - 20 mg/dL 12  Creatinine 0.61 - 1.24 mg/dL 0.90  Sodium 135 - 145 mmol/L 134(L)  Potassium 3.5 - 5.1 mmol/L 3.9  Chloride 98 - 111 mmol/L 100  CO2 22 - 32 mmol/L 24  Calcium 8.9 - 10.3 mg/dL 9.4  Total Protein 6.5 - 8.1 g/dL 7.7  Total Bilirubin 0.3 - 1.2 mg/dL 0.7  Alkaline Phos 38 - 126 U/L 88  AST 15 - 41 U/L 48(H)  ALT 0 - 44 U/L 93(H)   CBC Latest Ref Rng & Units 10/30/2020  WBC 4.0 - 10.5 K/uL 3.2(L)  Hemoglobin 13.0 - 17.0 g/dL 12.0(L)  Hematocrit 39 - 52 % 36.2(L)  Platelets 150 - 400 K/uL 225    No  images are attached to the encounter.  CT SOFT TISSUE NECK W CONTRAST  Result Date: 11/10/2020 CLINICAL DATA:  Lung cancer spread to the right cervical region with nonuse of the right arm. EXAM: CT NECK WITH CONTRAST TECHNIQUE: Multidetector CT imaging of the neck was performed using the standard protocol following the bolus administration of intravenous contrast. CONTRAST:  188m OMNIPAQUE IOHEXOL 300 MG/ML  SOLN COMPARISON:  08/13/2020 FINDINGS: Pharynx and larynx: Prominent mucosal enhancement presumably from radiotherapy. There is medialization of the left focal fold, suspected paresis. Salivary glands: Dense appearance, likely radiation related. Thyroid: Normal Lymph nodes: Left supraclavicular node on 3:90, significantly improved from prior, now 8 mm in short axis. No newly enlarged lymph node. Vascular: Porta catheter on the right in good position. Limited intracranial: Negative Visualized orbits: Negative Mastoids and visualized paranasal sinuses: Clear. Skeleton: Known extensive malignancy invading the cervical spine with left eccentric vertebral body erosion and sclerosis at C3-C6. There is involvement of the left C5 articular process based on comparison MRI, with pathologic fracture seen today but not previously. The mass is indistinguishable from left prevertebral musculature, but has significantly decreased in size from comparison PET-CT 08/12/2020. no right-sided supraclavicular involvement seen to explain history of right arm dysfunction. Mild retropharyngeal edematous appearance which is likely treatment related. Upper chest: Reported separately Other: These results will be called to the ordering clinician or representative by the Radiologist Assistant, and communication documented in the PACS or [employer]. IMPRESSION: 1. Marked reduction in the left eccentric cervical and prevertebral mass after therapy. An involved left supraclavicular node has also notably decreased in size. 2. There  is a pathologic fracture of the left C5 articular process which is new from outside cervical MRI 08/05/2020. 3. 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 4. Chest CT reported separately. Electronically Signed   By: Monte Fantasia M.D.   On: 11/10/2020 10:16   CT CHEST ABDOMEN PELVIS W CONTRAST  Result Date: 11/10/2020 CLINICAL DATA:  Follow-up left lung non-small-cell carcinoma. Previous chemotherapy and radiation therapy. Ongoing immunotherapy. Restaging. EXAM: CT CHEST, ABDOMEN, AND PELVIS WITH CONTRAST TECHNIQUE: Multidetector CT imaging of the chest, abdomen and pelvis was performed following the standard protocol during bolus administration of intravenous contrast. CONTRAST:  128m OMNIPAQUE IOHEXOL 300 MG/ML  SOLN COMPARISON:  PET-CT on 08/12/2020 FINDINGS: CT CHEST FINDINGS Cardiovascular: No acute findings. Mediastinum/Lymph Nodes: Site of previously seen mild left supraclavicular lymphadenopathy is now obscured by beam hardening artifact from injected IV contrast in the left subclavian vein. No other masses or sites of lymphadenopathy are identified. Lungs/Pleura: Solid nodule in the medial left lung apex measures 1.6 x 1.1 cm on image 45/5, and shows no significant change compared to prior study. Multifocal ill-defined areas of airspace opacity in the posterior left upper lobe and central left upper lobe are unchanged and are likely due to post radiation changes. No new or enlarging pulmonary nodules or masses identified. No evidence of pleural effusion. Musculoskeletal:  No suspicious bone lesions identified. CT ABDOMEN AND PELVIS FINDINGS Hepatobiliary: No masses identified. Gallbladder is unremarkable. No evidence of biliary ductal dilatation. Pancreas:  No mass or inflammatory changes. Spleen:  Within normal limits in size and appearance. Adrenals/Urinary tract:  No masses or hydronephrosis. Stomach/Bowel: No evidence of obstruction, inflammatory process, or abnormal fluid collections. Normal appendix  visualized. Vascular/Lymphatic: No pathologically enlarged lymph nodes identified. No abdominal aortic aneurysm. Reproductive:  No mass or other significant abnormality identified. Other:  None. Musculoskeletal:  No suspicious bone lesions identified. IMPRESSION: Site of previously seen mild left supraclavicular lymphadenopathy is obscured by artifact from injected IV contrast in the left subclavian vein on today's study. No other sites of lymphadenopathy identified. Stable left upper lobe pulmonary nodule. Stable probable post radiation changes in left upper and superior lower lobes. No new or progressive metastatic disease identified within the chest, abdomen, or pelvis. Electronically Signed   By: JMarlaine HindM.D.   On:  11/10/2020 10:16     Assessment and plan- Patient is a 49 y.o. male with stage IV adenocarcinoma of the lung with pleural metastases.He now presents with a large neck mass with biopsy consistent with previously diagnosed non-small cell lung cancer. He is here for on treatment assessment prior to cycle 1 of maintenance [name].  I have reviewed CT chest abdomen and pelvis CT soft tissue neck images independently and discussed findings with the patient.  Overall there has been a marked reduction in the size of the cervical and prevertebral mass as well as decrease in the size of the left supraclavicular node.  Outside of the night he does not have significant disease burden.  He has a stable left by the lung nodule 1.6 x 1.1 cm in size.  No other local regional adenopathy or evidence of distant metastatic disease.  My plan is to continue [name] every 3 weeks until progression or toxicity.  I will see him back in 3 weeks time for cycle 3 of [name].  Patient will continue to get B12 every 9 weeks and continue to take folic acid daily.  Patient was noted to have a pathological fracture of the C5 articular process.  I had referred the patient to Dr. Cari Caraway who performed flexion extension  x-rays which did not show any deformity or limitation in movement.  No surgical intervention is planned at this time and patient will wear a cervical collar as needed.   Visit Diagnosis 1. Encounter for antineoplastic chemotherapy   2. Malignant neoplasm of upper lobe of left lung ([place])      Dr. Randa Evens, MD, MPH [facility] [phone] 11/27/2020 8:33 AM

## 2020-11-27 NOTE — Progress Notes (Signed)
Port flushes without difficulty, no pain/reddness/swelling noted. Unable to get blood return from port. Dr. Janese Banks aware. Per Dr. Janese Banks labs to be done peripherally and okay to use port for treatment.

## 2020-12-14 ENCOUNTER — Ambulatory Visit: Payer: 59 | Admitting: Radiation Oncology

## 2020-12-17 ENCOUNTER — Inpatient Hospital Stay (HOSPITAL_BASED_OUTPATIENT_CLINIC_OR_DEPARTMENT_OTHER): Payer: Medicaid Other | Admitting: Oncology

## 2020-12-17 ENCOUNTER — Inpatient Hospital Stay: Payer: Medicaid Other

## 2020-12-17 ENCOUNTER — Ambulatory Visit: Payer: 59

## 2020-12-17 VITALS — BP 125/85 | HR 81 | Temp 97.1°F | Resp 16 | Wt 176.1 lb

## 2020-12-17 DIAGNOSIS — C3412 Malignant neoplasm of upper lobe, left bronchus or lung: Secondary | ICD-10-CM

## 2020-12-17 DIAGNOSIS — D701 Agranulocytosis secondary to cancer chemotherapy: Secondary | ICD-10-CM

## 2020-12-17 DIAGNOSIS — T451X5A Adverse effect of antineoplastic and immunosuppressive drugs, initial encounter: Secondary | ICD-10-CM

## 2020-12-17 DIAGNOSIS — Z5111 Encounter for antineoplastic chemotherapy: Secondary | ICD-10-CM | POA: Diagnosis not present

## 2020-12-17 LAB — CBC WITH DIFFERENTIAL/PLATELET
Abs Immature Granulocytes: 0.01 10*3/uL (ref 0.00–0.07)
Basophils Absolute: 0 10*3/uL (ref 0.0–0.1)
Basophils Relative: 1 %
Eosinophils Absolute: 0.3 10*3/uL (ref 0.0–0.5)
Eosinophils Relative: 10 %
HCT: 36.9 % — ABNORMAL LOW (ref 39.0–52.0)
Hemoglobin: 12.1 g/dL — ABNORMAL LOW (ref 13.0–17.0)
Immature Granulocytes: 0 %
Lymphocytes Relative: 33 %
Lymphs Abs: 1 10*3/uL (ref 0.7–4.0)
MCH: 31.5 pg (ref 26.0–34.0)
MCHC: 32.8 g/dL (ref 30.0–36.0)
MCV: 96.1 fL (ref 80.0–100.0)
Monocytes Absolute: 0.7 10*3/uL (ref 0.1–1.0)
Monocytes Relative: 22 %
Neutro Abs: 1.1 10*3/uL — ABNORMAL LOW (ref 1.7–7.7)
Neutrophils Relative %: 34 %
Platelets: 298 10*3/uL (ref 150–400)
RBC: 3.84 MIL/uL — ABNORMAL LOW (ref 4.22–5.81)
RDW: 15.1 % (ref 11.5–15.5)
WBC: 3.1 10*3/uL — ABNORMAL LOW (ref 4.0–10.5)
nRBC: 0 % (ref 0.0–0.2)

## 2020-12-17 LAB — COMPREHENSIVE METABOLIC PANEL
ALT: 101 U/L — ABNORMAL HIGH (ref 0–44)
AST: 55 U/L — ABNORMAL HIGH (ref 15–41)
Albumin: 3.9 g/dL (ref 3.5–5.0)
Alkaline Phosphatase: 95 U/L (ref 38–126)
Anion gap: 10 (ref 5–15)
BUN: 10 mg/dL (ref 6–20)
CO2: 23 mmol/L (ref 22–32)
Calcium: 8.9 mg/dL (ref 8.9–10.3)
Chloride: 100 mmol/L (ref 98–111)
Creatinine, Ser: 0.97 mg/dL (ref 0.61–1.24)
GFR, Estimated: >60 mL/min (ref >60)
Glucose, Bld: 121 mg/dL — ABNORMAL HIGH (ref 70–99)
Potassium: 3.6 mmol/L (ref 3.5–5.1)
Sodium: 133 mmol/L — ABNORMAL LOW (ref 135–145)
Total Bilirubin: 0.7 mg/dL (ref 0.3–1.2)
Total Protein: 7.4 g/dL (ref 6.5–8.1)

## 2020-12-17 MED ORDER — HEPARIN SOD (PORK) LOCK FLUSH 100 UNIT/ML IV SOLN
INTRAVENOUS | Status: AC
  Filled 2020-12-17: qty 5

## 2020-12-17 MED ORDER — HEPARIN SOD (PORK) LOCK FLUSH 100 UNIT/ML IV SOLN
500.0000 [IU] | Freq: Once | INTRAVENOUS | Status: AC | PRN
  Administered 2020-12-17: 11:00:00 500 [IU]
  Filled 2020-12-17: qty 5

## 2020-12-17 MED ORDER — SODIUM CHLORIDE 0.9% FLUSH
10.0000 mL | Freq: Once | INTRAVENOUS | Status: AC
  Administered 2020-12-17: 09:00:00 10 mL via INTRAVENOUS
  Filled 2020-12-17: qty 10

## 2020-12-17 MED ORDER — SODIUM CHLORIDE 0.9 % IV SOLN
10.0000 mg | Freq: Once | INTRAVENOUS | Status: AC
  Administered 2020-12-17: 11:00:00 10 mg via INTRAVENOUS
  Filled 2020-12-17: qty 10

## 2020-12-17 MED ORDER — SODIUM CHLORIDE 0.9 % IV SOLN
500.0000 mg/m2 | Freq: Once | INTRAVENOUS | Status: AC
  Administered 2020-12-17: 1000 mg via INTRAVENOUS
  Filled 2020-12-17: qty 40

## 2020-12-17 MED ORDER — SODIUM CHLORIDE 0.9 % IV SOLN
150.0000 mg | Freq: Once | INTRAVENOUS | Status: DC
  Filled 2020-12-17: qty 5

## 2020-12-17 MED ORDER — SODIUM CHLORIDE 0.9 % IV SOLN
Freq: Once | INTRAVENOUS | Status: AC
  Filled 2020-12-17: qty 250

## 2020-12-17 NOTE — Progress Notes (Signed)
Hematology/Oncology Consult note [facility]  [phone] [phone]  Patient Care Team: Default, Provider, MD as PCP - General Telford Nab, RN as Registered Nurse Sindy Guadeloupe, MD as Consulting Physician (Hematology and Oncology) Sindy Guadeloupe, MD as Consulting Physician (Hematology and Oncology)   Name of the patient: Kyle Guerrero  [id number]  06-Apr-1971   Date of visit: 12/17/20  Diagnosis- Non-small cell lung cancer stage IV acT2 cN2 cM1 a with pleural involvement   Chief complaint/ Reason for visit-on treatment assessment prior to cycle 3 of maintenance [name]  Heme/Onc history:patient is a 49 year old male who presented to the ER with symptoms ofheaviness in his chest and upper left chest discomfort he underwent CT angio chest to rule out PE which showed 3.8 x 3.3 cm left upper palpable lung mass along with 4.4 x 3.3 cm lobulated mass in the aortopulmonary window and 2.6 x 2.4 cm left hilar mass all concerning for malignancy. Patient has also seen pulmonary and has been set up for bronchoscopy and EBUS guided biopsy on 11/23/2019. Patient underwent. PET CT scan which showed a hypermetabolic spiculated 3.5 cm of 5 left upper lobe lung mass, adjacent hypermetabolic 3.2 x 1.2 cm pleural metastases in the medial posterior by the left pleural space along with scalloping of the adjacent posterior left third rib. Hypermetabolic infiltrative left perihilar conglomerate nodal metastases measuring up to 7.3 x 3.6 cm and 0.8 cm high left mediastinal node between the left brachiocephalic vein and left subclavian artery. No evidence of distant metastatic disease  Biopsy showed non-small cell lung cancer but further characterization could not be determined. Insufficient tissue for NGS testing. Repeat biopsy done. Results of NGStestingshowed PD-L1 50%. Tumor mutational burden high. 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  Patient completed concurrent chemoradiation with carbotaxol chemotherapy followed by 2 cycles of carbotaxol Keytruda andwason maintenance Keytruda  Patient was complaining of left shoulder pain and underwent MRI of the shoulder which showed8 in the supraspinatus tendon. He was subsequently seen by [facility] and underwent MRI of the cervical spine which showed a 4.6 x 4.8 x 8.2 cm mass in the left prevertebral region from C2-C7 levels. The mass partially encases the left vertebral artery and abuts the preforaminal segment. Invades the vertebral bodies and edema of the left C5 articular pillar. There is slight extension into the left C4-C5 thecal sac without central thecal sac compression. The mass displaces the left pharynx anteriorly along the left carotid sheath.Patient had a repeat biopsy which was consistent with adenocarcinoma.  Patient underwent palliative radiation treatment to his neck mass and completed 4 cycles of carboplatin and [name] with excellent response to treatment and is currently on maintenance [name].  Repeat NGS testing was also performed which did not show any evidence of actionable mutations  Interval history-patient is presently doing well and is able to lift 15 pounds of weight lifting with his left hand.  Denies any pain.  Denies any complaints at this time  ECOG PS- 0 Pain scale- 0   Review of systems- Review of Systems  Constitutional: Negative for chills, fever, malaise/fatigue and weight loss.  HENT: Negative for congestion, ear discharge and nosebleeds.   Eyes: Negative for blurred vision.  Respiratory: Negative for cough, hemoptysis, sputum production, shortness of breath and wheezing.   Cardiovascular: Negative for chest pain, palpitations, orthopnea and claudication.  Gastrointestinal: Negative for abdominal pain, blood in stool, constipation, diarrhea, heartburn, melena, nausea and  vomiting.  Genitourinary: Negative  for dysuria, flank pain, frequency, hematuria and urgency.  Musculoskeletal: Negative for back pain, joint pain and myalgias.  Skin: Negative for rash.  Neurological: Negative for dizziness, tingling, focal weakness, seizures, weakness and headaches.  Endo/Heme/Allergies: Does not bruise/bleed easily.  Psychiatric/Behavioral: Negative for depression and suicidal ideas. The patient does not have insomnia.      No Known Allergies   Past Medical History:  Diagnosis Date   Asthma    Lung cancer ([place])      Past Surgical History:  Procedure Laterality Date   CYST EXCISION     IR IMAGING GUIDED PORT INSERTION  12/05/2019   VIDEO BRONCHOSCOPY WITH ENDOBRONCHIAL ULTRASOUND Left 11/22/2019   Procedure: VIDEO BRONCHOSCOPY WITH ENDOBRONCHIAL ULTRASOUND;  Surgeon: Tyler Pita, MD;  Location: [facility] ORS;  Service: Thoracic;  Laterality: Left;    Social History   Socioeconomic History   Marital status: Single    Spouse name: Not on file   Number of children: Not on file   Years of education: Not on file   Highest education level: Not on file  Occupational History   Not on file  Tobacco Use   Smoking status: Former Smoker    Packs/day: 2.00    Types: Cigarettes    Quit date: 11/08/2019    Years since quitting: 1.1   Smokeless tobacco: Never Used  Vaping Use   Vaping Use: Never used  Substance and Sexual Activity   Alcohol use: Not Currently   Drug use: Yes    Types: Marijuana   Sexual activity: Not on file  Other Topics Concern   Not on file  Social History Narrative   Not on file   Social Determinants of Health   Financial Resource Strain: Not on file  Food Insecurity: Not on file  Transportation Needs: Not on file  Physical Activity: Not on file  Stress: Not on file  Social Connections: Not on file  Intimate Partner Violence: Not on file    Family History  Problem Relation Age of Onset   Healthy Mother    Healthy Father      Current  Outpatient Medications:    folic acid (FOLVITE) 1 MG tablet, Take 1 tablet (1 mg total) by mouth daily. Start 5-7 days before [name] chemotherapy. Continue until 21 days after [name] completed. (Patient not taking: Reported on 10/23/2020), Disp: 100 tablet, Rfl: 3   ondansetron (ZOFRAN) 8 MG tablet, Take 1 tablet (8 mg total) by mouth 2 (two) times daily as needed (Nausea or vomiting). Start if needed on the third day after chemotherapy. (Patient not taking: Reported on 08/21/2020), Disp: 30 tablet, Rfl: 1 No current facility-administered medications for this visit.  Facility-Administered Medications Ordered in Other Visits:    sodium chloride flush (NS) 0.9 % injection 10 mL, 10 mL, Intravenous, PRN, Sindy Guadeloupe, MD, 10 mL at 05/01/20 0900  Physical exam:  Vitals:   12/17/20 0847  BP: 125/85  Pulse: 81  Resp: 16  Temp: (!) 97.1 F (36.2 C)  TempSrc: Tympanic  SpO2: 100%  Weight: 176 lb 1.6 oz (79.9 kg)   Physical Exam Constitutional:      General: He is not in acute distress. Eyes:     Extraocular Movements: EOM normal.  Cardiovascular:     Rate and Rhythm: Normal rate and regular rhythm.     Heart sounds: Normal heart sounds.  Pulmonary:     Effort: Pulmonary effort is normal.     Breath  breath sounds.  °Abdominal:  °   General: Bowel sounds are normal.  °   Palpations: Abdomen is soft.  °Skin: °   General: Skin is warm and dry.  °Neurological:  °   Mental Status: He is alert and oriented to person, place, and time.  ° °  ° °CMP Latest Ref Rng & Units 11/27/2020  °Glucose 70 - 99 mg/dL 144(H)  °BUN 6 - 20 mg/dL 17  °Creatinine 0.61 - 1.24 mg/dL 0.99  °Sodium 135 - 145 mmol/L 131(L)  °Potassium 3.5 - 5.1 mmol/L 3.9  °Chloride 98 - 111 mmol/L 96(L)  °CO2 22 - 32 mmol/L 25  °Calcium 8.9 - 10.3 mg/dL 9.0  °Total Protein 6.5 - 8.1 g/dL 7.6  °Total Bilirubin 0.3 - 1.2 mg/dL 0.6  °Alkaline Phos 38 - 126 U/L 82  °AST 15 - 41 U/L 27  °ALT 0 - 44 U/L 40  ° °CBC Latest Ref Rng  & Units 12/17/2020  °WBC 4.0 - 10.5 K/uL 3.1(L)  °Hemoglobin 13.0 - 17.0 g/dL 12.1(L)  °Hematocrit 39.0 - 52.0 % 36.9(L)  °Platelets 150 - 400 K/uL 298  ° ° ° ° °Assessment and plan- Patient is a 49 y.o. male with stage IV adenocarcinoma of the lung with pleural metastases. He now presents with a large neck mass with biopsy consistent with previously diagnosed non-small cell lung cancer.  He is here for on treatment assessment prior to cycle 4 of maintenance [name] ° °Counts okay to proceed with cycle 4 of maintenance AlimtaA.  White cell count is low at 3.1 with an ANC of 1.1.  He will proceed with on pro-Neulasta as well which he will self administer at home given his insurance issues. ° °I will see him back in 3 weeks for cycle 3 of maintenance [name].  Patient will continue to get folic acid daily and will get B12 every 9 weeks.  Plan to get repeat scans in February 2021 °  °Visit Diagnosis °1. Encounter for antineoplastic chemotherapy   °2. Malignant neoplasm of upper lobe of left lung (H[facility])   ° ° ° °Dr.  , MD, MPH °[facility] °[phone] °12/17/2020 °8:51 AM ° ° ° ° ° ° °    ° ° ° ° ° °

## 2020-12-17 NOTE — Progress Notes (Signed)
0952- ANC: 1100, ALT: 101. MD, Dr. Janese Banks, notified and aware. Per MD order: proceed with scheduled [name] treatment today.   1126- Patient tolerated treatment well. Patient stable and discharged to home at this time.

## 2020-12-17 NOTE — Progress Notes (Signed)
Pt doing good, able to do 15 lbs weights on left side . Eating good, feeling good, no c/o.  Still has numbness at 2 fingers thumb and pointer on left side.

## 2020-12-23 NOTE — Progress Notes (Signed)
..  The following Medication: Neulasta is approved for drug replacement program by [employer]. The enrollment period is from 12/22/2020 to 12/22/2021.  Reason for Assistance: Self Pay/ Insurance Terminated 11/25/2020. ID: [id number] First DOS:TBD.

## 2021-01-01 NOTE — Progress Notes (Signed)
..  The following Medication: [name] is approved for drug replacement program by [employer]. The enrollment period is from 12/27/2020 to 12/27/2021.  Reason for Assistance: Self Pay/ Insurance terminated 11/25/2020. ID: [id number] First DOS:11/27/2020.

## 2021-01-07 ENCOUNTER — Other Ambulatory Visit: Payer: Self-pay

## 2021-01-07 ENCOUNTER — Inpatient Hospital Stay: Payer: Medicaid Other

## 2021-01-07 ENCOUNTER — Inpatient Hospital Stay (HOSPITAL_BASED_OUTPATIENT_CLINIC_OR_DEPARTMENT_OTHER): Payer: Medicaid Other | Admitting: Oncology

## 2021-01-07 ENCOUNTER — Encounter: Payer: Self-pay | Admitting: Oncology

## 2021-01-07 ENCOUNTER — Inpatient Hospital Stay: Payer: Medicaid Other | Attending: Oncology

## 2021-01-07 VITALS — BP 121/87 | HR 86 | Temp 98.0°F | Wt 173.1 lb

## 2021-01-07 VITALS — Resp 20

## 2021-01-07 DIAGNOSIS — C782 Secondary malignant neoplasm of pleura: Secondary | ICD-10-CM | POA: Insufficient documentation

## 2021-01-07 DIAGNOSIS — R9389 Abnormal findings on diagnostic imaging of other specified body structures: Secondary | ICD-10-CM

## 2021-01-07 DIAGNOSIS — Z87891 Personal history of nicotine dependence: Secondary | ICD-10-CM | POA: Diagnosis not present

## 2021-01-07 DIAGNOSIS — C3412 Malignant neoplasm of upper lobe, left bronchus or lung: Secondary | ICD-10-CM | POA: Diagnosis not present

## 2021-01-07 DIAGNOSIS — D701 Agranulocytosis secondary to cancer chemotherapy: Secondary | ICD-10-CM

## 2021-01-07 DIAGNOSIS — Z5111 Encounter for antineoplastic chemotherapy: Secondary | ICD-10-CM | POA: Diagnosis not present

## 2021-01-07 DIAGNOSIS — R531 Weakness: Secondary | ICD-10-CM | POA: Insufficient documentation

## 2021-01-07 DIAGNOSIS — D72819 Decreased white blood cell count, unspecified: Secondary | ICD-10-CM | POA: Insufficient documentation

## 2021-01-07 DIAGNOSIS — C3492 Malignant neoplasm of unspecified part of left bronchus or lung: Secondary | ICD-10-CM

## 2021-01-07 DIAGNOSIS — T451X5A Adverse effect of antineoplastic and immunosuppressive drugs, initial encounter: Secondary | ICD-10-CM

## 2021-01-07 LAB — CBC WITH DIFFERENTIAL/PLATELET
Abs Immature Granulocytes: 0.03 10*3/uL (ref 0.00–0.07)
Basophils Absolute: 0 10*3/uL (ref 0.0–0.1)
Basophils Relative: 1 %
Eosinophils Absolute: 0.2 10*3/uL (ref 0.0–0.5)
Eosinophils Relative: 6 %
HCT: 37.6 % — ABNORMAL LOW (ref 39.0–52.0)
Hemoglobin: 12.8 g/dL — ABNORMAL LOW (ref 13.0–17.0)
Immature Granulocytes: 1 %
Lymphocytes Relative: 36 %
Lymphs Abs: 1.2 10*3/uL (ref 0.7–4.0)
MCH: 32.3 pg (ref 26.0–34.0)
MCHC: 34 g/dL (ref 30.0–36.0)
MCV: 94.9 fL (ref 80.0–100.0)
Monocytes Absolute: 0.8 10*3/uL (ref 0.1–1.0)
Monocytes Relative: 26 %
Neutro Abs: 1 10*3/uL — ABNORMAL LOW (ref 1.7–7.7)
Neutrophils Relative %: 30 %
Platelets: 352 10*3/uL (ref 150–400)
RBC: 3.96 MIL/uL — ABNORMAL LOW (ref 4.22–5.81)
RDW: 14 % (ref 11.5–15.5)
WBC: 3.3 10*3/uL — ABNORMAL LOW (ref 4.0–10.5)
nRBC: 0 % (ref 0.0–0.2)

## 2021-01-07 LAB — COMPREHENSIVE METABOLIC PANEL
ALT: 44 U/L (ref 0–44)
AST: 31 U/L (ref 15–41)
Albumin: 3.8 g/dL (ref 3.5–5.0)
Alkaline Phosphatase: 106 U/L (ref 38–126)
Anion gap: 6 (ref 5–15)
BUN: 13 mg/dL (ref 6–20)
CO2: 29 mmol/L (ref 22–32)
Calcium: 9.1 mg/dL (ref 8.9–10.3)
Chloride: 100 mmol/L (ref 98–111)
Creatinine, Ser: 0.89 mg/dL (ref 0.61–1.24)
GFR, Estimated: >60 mL/min (ref >60)
Glucose, Bld: 97 mg/dL (ref 70–99)
Potassium: 3.7 mmol/L (ref 3.5–5.1)
Sodium: 135 mmol/L (ref 135–145)
Total Bilirubin: 0.5 mg/dL (ref 0.3–1.2)
Total Protein: 7.5 g/dL (ref 6.5–8.1)

## 2021-01-07 LAB — TSH: TSH: 2.472 u[IU]/mL (ref 0.350–4.500)

## 2021-01-07 MED ORDER — SODIUM CHLORIDE 0.9 % IV SOLN
10.0000 mg | Freq: Once | INTRAVENOUS | Status: AC
  Administered 2021-01-07: 10 mg via INTRAVENOUS
  Filled 2021-01-07: qty 10

## 2021-01-07 MED ORDER — SODIUM CHLORIDE 0.9 % IV SOLN
Freq: Once | INTRAVENOUS | Status: AC
  Filled 2021-01-07: qty 250

## 2021-01-07 MED ORDER — PEMETREXED DISODIUM CHEMO INJECTION 500 MG
500.0000 mg/m2 | Freq: Once | INTRAVENOUS | Status: AC
  Administered 2021-01-07: 1000 mg via INTRAVENOUS
  Filled 2021-01-07: qty 40

## 2021-01-07 MED ORDER — HEPARIN SOD (PORK) LOCK FLUSH 100 UNIT/ML IV SOLN
INTRAVENOUS | Status: AC
  Filled 2021-01-07: qty 5

## 2021-01-07 MED ORDER — HEPARIN SOD (PORK) LOCK FLUSH 100 UNIT/ML IV SOLN
500.0000 [IU] | Freq: Once | INTRAVENOUS | Status: AC | PRN
  Administered 2021-01-07: 500 [IU]
  Filled 2021-01-07: qty 5

## 2021-01-07 MED ORDER — SODIUM CHLORIDE 0.9 % IV SOLN: 150.0000 mg | Freq: Once | INTRAVENOUS | Status: DC

## 2021-01-07 MED ORDER — SODIUM CHLORIDE 0.9% FLUSH
10.0000 mL | INTRAVENOUS | Status: DC | PRN
  Administered 2021-01-07: 10 mL
  Filled 2021-01-07: qty 10

## 2021-01-07 NOTE — Progress Notes (Signed)
ANC: 1000. MD, Dr. Janese Banks, notified and aware. Per MD order: proceed with scheduled [name] treatment today.  1215- Patient tolerated treatment well. Patient stable and discharged to home at this time.

## 2021-01-07 NOTE — Progress Notes (Signed)
Hematology/Oncology Consult note [facility]  [phone] [phone]  Patient Care Team: Default, Provider, MD as PCP - General Telford Nab, RN as Registered Nurse Sindy Guadeloupe, MD as Consulting Physician (Hematology and Oncology) Sindy Guadeloupe, MD as Consulting Physician (Hematology and Oncology)   Name of the patient: Kyle Guerrero  [id number]  [birth year]   Date of visit: 01/07/21  Diagnosis- Non-small cell lung cancer stage IV acT2 cN2 cM1 a with pleural involvement  Chief complaint/ Reason for visit-on treatment assessment prior to cycle 4 of maintenance [name]  Heme/Onc history: patient is a 50 year old male who presented to the ER with symptoms ofheaviness in his chest and upper left chest discomfort he underwent CT angio chest to rule out PE which showed 3.8 x 3.3 cm left upper palpable lung mass along with 4.4 x 3.3 cm lobulated mass in the aortopulmonary window and 2.6 x 2.4 cm left hilar mass all concerning for malignancy. Patient has also seen pulmonary and has been set up for bronchoscopy and EBUS guided biopsy on 11/23/2019. Patient underwent. PET CT scan which showed a hypermetabolic spiculated 3.5 cm of 5 left upper lobe lung mass, adjacent hypermetabolic 3.2 x 1.2 cm pleural metastases in the medial posterior by the left pleural space along with scalloping of the adjacent posterior left third rib. Hypermetabolic infiltrative left perihilar conglomerate nodal metastases measuring up to 7.3 x 3.6 cm and 0.8 cm high left mediastinal node between the left brachiocephalic vein and left subclavian artery. No evidence of distant metastatic disease  Biopsy showed non-small cell lung cancer but further characterization could not be determined. Insufficient tissue for NGS testing. Repeat biopsy done. Results of NGStestingshowed PD-L1 50%. Tumor mutational burden high. ERBB2 copy number again.NGS testing on peripheral blood  showed NTRK mutation.  Patient completed concurrent chemoradiation with carbotaxol chemotherapy followed by 2 cycles of carbotaxol Keytruda andwason maintenance Keytruda  Patient was complaining of left shoulder pain and underwent MRI of the shoulder which showed8 in the supraspinatus tendon. He was subsequently seen by [facility] and underwent MRI of the cervical spine which showed a 4.6 x 4.8 x 8.2 cm mass in the left prevertebral region from C2-C7 levels. The mass partially encases the left vertebral artery and abuts the preforaminal segment. Invades the vertebral bodies and edema of the left C5 articular pillar. There is slight extension into the left C4-C5 thecal sac without central thecal sac compression. The mass displaces the left pharynx anteriorly along the left carotid sheath.Patient had a repeat biopsy which was consistent with adenocarcinoma.  Patient underwent palliative radiation treatment to his neck mass and completed 4 cycles of carboplatin and [name] with excellent response to treatment and is currently on maintenance [name].  Repeat NGS testing was also performed which did not show any evidence of actionable mutations  Interval history-patient reports doing well and denies any complaints at this time.  He has some residual weakness in his left upper extremity and he continues to exercise and build strength in that arm  ECOG PS- 0 Pain scale- 0 Opioid associated constipation- no  Review of systems- Review of Systems  Constitutional: Negative for chills, fever, malaise/fatigue and weight loss.  HENT: Negative for congestion, ear discharge and nosebleeds.   Eyes: Negative for blurred vision.  Respiratory: Negative for cough, hemoptysis, sputum production, shortness of breath and wheezing.   Cardiovascular: Negative for chest pain, palpitations, orthopnea and claudication.  Gastrointestinal: Negative for abdominal pain, blood in stool, constipation,  diarrhea,  heartburn, melena, nausea and vomiting.  Genitourinary: Negative for dysuria, flank pain, frequency, hematuria and urgency.  Musculoskeletal: Negative for back pain, joint pain and myalgias.  Skin: Negative for rash.  Neurological: Negative for dizziness, tingling, focal weakness, seizures, weakness and headaches.  Endo/Heme/Allergies: Does not bruise/bleed easily.  Psychiatric/Behavioral: Negative for depression and suicidal ideas. The patient does not have insomnia.      No Known Allergies   Past Medical History:  Diagnosis Date  . Asthma   . Lung cancer [facility])      Past Surgical History:  Procedure Laterality Date  . CYST EXCISION    . IR IMAGING GUIDED PORT INSERTION  12/05/2019  . VIDEO BRONCHOSCOPY WITH ENDOBRONCHIAL ULTRASOUND Left 11/22/2019   Procedure: VIDEO BRONCHOSCOPY WITH ENDOBRONCHIAL ULTRASOUND;  Surgeon: Tyler Pita, MD;  Location: [facility] ORS;  Service: Thoracic;  Laterality: Left;    Social History   Socioeconomic History  . Marital status: Single    Spouse name: Not on file  . Number of children: Not on file  . Years of education: Not on file  . Highest education level: Not on file  Occupational History  . Not on file  Tobacco Use  . Smoking status: Former Smoker    Packs/day: 2.00    Types: Cigarettes    Quit date: 11/08/2019    Years since quitting: 1.1  . Smokeless tobacco: Never Used  Vaping Use  . Vaping Use: Never used  Substance and Sexual Activity  . Alcohol use: Not Currently  . Drug use: Yes    Types: Marijuana  . Sexual activity: Not on file  Other Topics Concern  . Not on file  Social History Narrative  . Not on file   Social Determinants of Health   Financial Resource Strain: Not on file  Food Insecurity: Not on file  Transportation Needs: Not on file  Physical Activity: Not on file  Stress: Not on file  Social Connections: Not on file  Intimate Partner Violence: Not on file    Family History  Problem Relation Age  of Onset  . Healthy Mother   . Healthy Father      Current Outpatient Medications:  .  folic acid (FOLVITE) 1 MG tablet, Take 1 tablet (1 mg total) by mouth daily. Start 5-7 days before [name] chemotherapy. Continue until 21 days after [name] completed., Disp: 100 tablet, Rfl: 3 .  ondansetron (ZOFRAN) 8 MG tablet, Take 1 tablet (8 mg total) by mouth 2 (two) times daily as needed (Nausea or vomiting). Start if needed on the third day after chemotherapy. (Patient not taking: Reported on 01/07/2021), Disp: 30 tablet, Rfl: 1 No current facility-administered medications for this visit.  Facility-Administered Medications Ordered in Other Visits:  .  sodium chloride flush (NS) 0.9 % injection 10 mL, 10 mL, Intravenous, PRN, Sindy Guadeloupe, MD, 10 mL at 05/01/20 0900 .  sodium chloride flush (NS) 0.9 % injection 10 mL, 10 mL, Intracatheter, PRN, Sindy Guadeloupe, MD, 10 mL at 01/07/21 1038  Physical exam:  Vitals:   01/07/21 0919  BP: 121/87  Pulse: 86  Temp: 98 F (36.7 C)  TempSrc: Oral  Weight: 173 lb 1.6 oz (78.5 kg)   Physical Exam Constitutional:      General: He is not in acute distress. Eyes:     Extraocular Movements: EOM normal.  Cardiovascular:     Rate and Rhythm: Normal rate and regular rhythm.     Heart sounds: Normal heart sounds.  Pulmonary:     Effort: Pulmonary effort is normal.     Breath sounds: Normal breath sounds.  Abdominal:     General: Bowel sounds are normal.     Palpations: Abdomen is soft.  Skin:    General: Skin is warm and dry.  Neurological:     Mental Status: He is alert and oriented to person, place, and time.      CMP Latest Ref Rng & Units 01/07/2021  Glucose 70 - 99 mg/dL 97  BUN 6 - 20 mg/dL 13  Creatinine 0.61 - 1.24 mg/dL 0.89  Sodium 135 - 145 mmol/L 135  Potassium 3.5 - 5.1 mmol/L 3.7  Chloride 98 - 111 mmol/L 100  CO2 22 - 32 mmol/L 29  Calcium 8.9 - 10.3 mg/dL 9.1  Total Protein 6.5 - 8.1 g/dL 7.5  Total Bilirubin 0.3 - 1.2  mg/dL 0.5  Alkaline Phos 38 - 126 U/L 106  AST 15 - 41 U/L 31  ALT 0 - 44 U/L 44   CBC Latest Ref Rng & Units 01/07/2021  WBC 4.0 - 10.5 K/uL 3.3(L)  Hemoglobin 13.0 - 17.0 g/dL 12.8(L)  Hematocrit 39.0 - 52.0 % 37.6(L)  Platelets 150 - 400 K/uL 352      Assessment and plan- Patient is a 50 y.o. male stage IV adenocarcinoma of the lung with pleural metastases.He now presents with a large neck mass with biopsy consistent with previously diagnosed non-small cell lung cancer.   He is here for on treatment assessment prior to cycle 5 of maintenance [name]  Counts okay to proceed with cycle 5 of maintenance [name] today.  He does have a mild leukopenia with a white count of 3.3 and ANC of 1.  He will receive on pro Neulasta at home today.  I will see him back in 3 weeks for cycle 6 of maintenance [name].  He will need a repeat CT chest abdomen and pelvis with contrast and a CT soft tissue neck in 5 weeks time.   Visit Diagnosis 1. Malignant neoplasm of upper lobe of left lung ([place])   2. Abnormal CT scan, neck   3. Non-small cell cancer of left lung ([place])   4. Encounter for antineoplastic chemotherapy      Dr. Randa Evens, MD, MPH [facility] [phone] 01/07/2021 1:12 PM

## 2021-01-29 ENCOUNTER — Inpatient Hospital Stay: Payer: Medicaid Other

## 2021-01-29 ENCOUNTER — Encounter: Payer: Self-pay | Admitting: Oncology

## 2021-01-29 ENCOUNTER — Inpatient Hospital Stay: Payer: Medicaid Other | Attending: Oncology

## 2021-01-29 ENCOUNTER — Inpatient Hospital Stay (HOSPITAL_BASED_OUTPATIENT_CLINIC_OR_DEPARTMENT_OTHER): Payer: Medicaid Other | Admitting: Oncology

## 2021-01-29 VITALS — BP 110/73 | HR 77 | Temp 97.1°F | Resp 20 | Wt 173.8 lb

## 2021-01-29 DIAGNOSIS — Z87891 Personal history of nicotine dependence: Secondary | ICD-10-CM | POA: Diagnosis not present

## 2021-01-29 DIAGNOSIS — C782 Secondary malignant neoplasm of pleura: Secondary | ICD-10-CM | POA: Diagnosis not present

## 2021-01-29 DIAGNOSIS — C3412 Malignant neoplasm of upper lobe, left bronchus or lung: Secondary | ICD-10-CM | POA: Diagnosis not present

## 2021-01-29 DIAGNOSIS — D709 Neutropenia, unspecified: Secondary | ICD-10-CM | POA: Diagnosis present

## 2021-01-29 DIAGNOSIS — D701 Agranulocytosis secondary to cancer chemotherapy: Secondary | ICD-10-CM

## 2021-01-29 DIAGNOSIS — Z5111 Encounter for antineoplastic chemotherapy: Secondary | ICD-10-CM | POA: Insufficient documentation

## 2021-01-29 DIAGNOSIS — T451X5A Adverse effect of antineoplastic and immunosuppressive drugs, initial encounter: Secondary | ICD-10-CM

## 2021-01-29 DIAGNOSIS — E538 Deficiency of other specified B group vitamins: Secondary | ICD-10-CM | POA: Diagnosis not present

## 2021-01-29 DIAGNOSIS — Z5189 Encounter for other specified aftercare: Secondary | ICD-10-CM | POA: Insufficient documentation

## 2021-01-29 LAB — COMPREHENSIVE METABOLIC PANEL
ALT: 33 U/L (ref 0–44)
AST: 30 U/L (ref 15–41)
Albumin: 3.7 g/dL (ref 3.5–5.0)
Alkaline Phosphatase: 99 U/L (ref 38–126)
Anion gap: 9 (ref 5–15)
BUN: 13 mg/dL (ref 6–20)
CO2: 25 mmol/L (ref 22–32)
Calcium: 8.9 mg/dL (ref 8.9–10.3)
Chloride: 101 mmol/L (ref 98–111)
Creatinine, Ser: 0.96 mg/dL (ref 0.61–1.24)
GFR, Estimated: >60 mL/min (ref >60)
Glucose, Bld: 97 mg/dL (ref 70–99)
Potassium: 3.7 mmol/L (ref 3.5–5.1)
Sodium: 135 mmol/L (ref 135–145)
Total Bilirubin: 0.5 mg/dL (ref 0.3–1.2)
Total Protein: 7.4 g/dL (ref 6.5–8.1)

## 2021-01-29 LAB — CBC WITH DIFFERENTIAL/PLATELET
Abs Immature Granulocytes: 0.02 10*3/uL (ref 0.00–0.07)
Basophils Absolute: 0 10*3/uL (ref 0.0–0.1)
Basophils Relative: 1 %
Eosinophils Absolute: 0.4 10*3/uL (ref 0.0–0.5)
Eosinophils Relative: 12 %
HCT: 36.9 % — ABNORMAL LOW (ref 39.0–52.0)
Hemoglobin: 12.6 g/dL — ABNORMAL LOW (ref 13.0–17.0)
Immature Granulocytes: 1 %
Lymphocytes Relative: 38 %
Lymphs Abs: 1.4 10*3/uL (ref 0.7–4.0)
MCH: 31.9 pg (ref 26.0–34.0)
MCHC: 34.1 g/dL (ref 30.0–36.0)
MCV: 93.4 fL (ref 80.0–100.0)
Monocytes Absolute: 1 10*3/uL (ref 0.1–1.0)
Monocytes Relative: 30 %
Neutro Abs: 0.6 10*3/uL — ABNORMAL LOW (ref 1.7–7.7)
Neutrophils Relative %: 18 %
Platelets: 295 10*3/uL (ref 150–400)
RBC: 3.95 MIL/uL — ABNORMAL LOW (ref 4.22–5.81)
RDW: 14 % (ref 11.5–15.5)
WBC: 3.5 10*3/uL — ABNORMAL LOW (ref 4.0–10.5)
nRBC: 0 % (ref 0.0–0.2)

## 2021-01-29 MED ORDER — SODIUM CHLORIDE 0.9% FLUSH
10.0000 mL | INTRAVENOUS | Status: AC | PRN
  Administered 2021-01-29: 10 mL via INTRAVENOUS
  Filled 2021-01-29: qty 10

## 2021-01-29 MED ORDER — HEPARIN SOD (PORK) LOCK FLUSH 100 UNIT/ML IV SOLN
500.0000 [IU] | Freq: Once | INTRAVENOUS | Status: AC
  Filled 2021-01-29: qty 5

## 2021-01-29 NOTE — Progress Notes (Signed)
Hematology/Oncology Consult note [facility]  Telephone:([phone] [phone]  Patient Care Team: Default, Provider, MD as PCP - General Telford Nab, RN as Registered Nurse Sindy Guadeloupe, MD as Consulting Physician (Hematology and Oncology) Sindy Guadeloupe, MD as Consulting Physician (Hematology and Oncology)   Name of the patient: Kyle Guerrero  [id number]  04-05-1971   Date of visit: 01/29/21  Diagnosis- Non-small cell lung cancer stage IV acT2 cN2 cM1 a with pleural involvement  Chief complaint/ Reason for visit-on treatment assessment prior to cycle 4 of maintenance [name]  Heme/Onc history: patient is a 50 year old male who presented to the ER with symptoms ofheaviness in his chest and upper left chest discomfort he underwent CT angio chest to rule out PE which showed 3.8 x 3.3 cm left upper palpable lung mass along with 4.4 x 3.3 cm lobulated mass in the aortopulmonary window and 2.6 x 2.4 cm left hilar mass all concerning for malignancy. Patient has also seen pulmonary and has been set up for bronchoscopy and EBUS guided biopsy on 11/23/2019. Patient underwent. PET CT scan which showed a hypermetabolic spiculated 3.5 cm of 5 left upper lobe lung mass, adjacent hypermetabolic 3.2 x 1.2 cm pleural metastases in the medial posterior by the left pleural space along with scalloping of the adjacent posterior left third rib. Hypermetabolic infiltrative left perihilar conglomerate nodal metastases measuring up to 7.3 x 3.6 cm and 0.8 cm high left mediastinal node between the left brachiocephalic vein and left subclavian artery. No evidence of distant metastatic disease  Biopsy showed non-small cell lung cancer but further characterization could not be determined. Insufficient tissue for NGS testing. Repeat biopsy done. Results of NGStestingshowed PD-L1 50%. Tumor mutational burden high. ERBB2 copy number again.NGS testing on peripheral blood  showed NTRK mutation.  Patient completed concurrent chemoradiation with carbotaxol chemotherapy followed by 2 cycles of carbotaxol Keytruda andwason maintenance Keytruda  Patient was complaining of left shoulder pain and underwent MRI of the shoulder which showed8 in the supraspinatus tendon. He was subsequently seen by [facility] and underwent MRI of the cervical spine which showed a 4.6 x 4.8 x 8.2 cm mass in the left prevertebral region from C2-C7 levels. The mass partially encases the left vertebral artery and abuts the preforaminal segment. Invades the vertebral bodies and edema of the left C5 articular pillar. There is slight extension into the left C4-C5 thecal sac without central thecal sac compression. The mass displaces the left pharynx anteriorly along the left carotid sheath.Patient had a repeat biopsy which was consistent with adenocarcinoma.  Patient underwent palliative radiation treatment to his neck mass and completed 4 cycles of carboplatin and [name] with excellent response to treatment and is currently on maintenance [name]. Repeat NGS testing was also performed which did not show any evidence of actionable mutations  Interval history-patient reports doing well and denies any new complaints at this time appetite and weight have remained stable.  No new weakness or numbness.  Left upper extremity strength is stable  ECOG PS- 0 Pain scale- 0   Review of systems- Review of Systems  Constitutional: Negative for chills, fever, malaise/fatigue and weight loss.  HENT: Negative for congestion, ear discharge and nosebleeds.   Eyes: Negative for blurred vision.  Respiratory: Negative for cough, hemoptysis, sputum production, shortness of breath and wheezing.   Cardiovascular: Negative for chest pain, palpitations, orthopnea and claudication.  Gastrointestinal: Negative for abdominal pain, blood in stool, constipation, diarrhea, heartburn, melena, nausea and vomiting.  Genitourinary: Negative for dysuria, flank pain, frequency, hematuria and urgency.  Musculoskeletal: Negative for back pain, joint pain and myalgias.  Skin: Negative for rash.  Neurological: Negative for dizziness, tingling, focal weakness, seizures, weakness and headaches.  Endo/Heme/Allergies: Does not bruise/bleed easily.  Psychiatric/Behavioral: Negative for depression and suicidal ideas. The patient does not have insomnia.       No Known Allergies   Past Medical History:  Diagnosis Date  . Asthma   . Lung cancer [facility])      Past Surgical History:  Procedure Laterality Date  . CYST EXCISION    . IR IMAGING GUIDED PORT INSERTION  12/05/2019  . VIDEO BRONCHOSCOPY WITH ENDOBRONCHIAL ULTRASOUND Left 11/22/2019   Procedure: VIDEO BRONCHOSCOPY WITH ENDOBRONCHIAL ULTRASOUND;  Surgeon: Tyler Pita, MD;  Location: [facility] ORS;  Service: Thoracic;  Laterality: Left;    Social History   Socioeconomic History  . Marital status: Single    Spouse name: Not on file  . Number of children: Not on file  . Years of education: Not on file  . Highest education level: Not on file  Occupational History  . Not on file  Tobacco Use  . Smoking status: Former Smoker    Packs/day: 2.00    Types: Cigarettes    Quit date: 11/08/2019    Years since quitting: 1.2  . Smokeless tobacco: Never Used  Vaping Use  . Vaping Use: Never used  Substance and Sexual Activity  . Alcohol use: Not Currently  . Drug use: Yes    Types: Marijuana  . Sexual activity: Not on file  Other Topics Concern  . Not on file  Social History Narrative  . Not on file   Social Determinants of Health   Financial Resource Strain: Not on file  Food Insecurity: Not on file  Transportation Needs: Not on file  Physical Activity: Not on file  Stress: Not on file  Social Connections: Not on file  Intimate Partner Violence: Not on file    Family History  Problem Relation Age of Onset  . Healthy Mother   .  Healthy Father      Current Outpatient Medications:  .  folic acid (FOLVITE) 1 MG tablet, Take 1 tablet (1 mg total) by mouth daily. Start 5-7 days before [name] chemotherapy. Continue until 21 days after [name] completed., Disp: 100 tablet, Rfl: 3 .  ondansetron (ZOFRAN) 8 MG tablet, Take 1 tablet (8 mg total) by mouth 2 (two) times daily as needed (Nausea or vomiting). Start if needed on the third day after chemotherapy. (Patient not taking: No sig reported), Disp: 30 tablet, Rfl: 1 No current facility-administered medications for this visit.  Facility-Administered Medications Ordered in Other Visits:  .  heparin lock flush 100 unit/mL, 500 Units, Intravenous, Once, Randa Evens C, MD .  sodium chloride flush (NS) 0.9 % injection 10 mL, 10 mL, Intravenous, PRN, Sindy Guadeloupe, MD, 10 mL at 05/01/20 0900 .  sodium chloride flush (NS) 0.9 % injection 10 mL, 10 mL, Intravenous, PRN, Sindy Guadeloupe, MD, 10 mL at 01/29/21 0835  Physical exam:  Vitals:   01/29/21 0845  BP: 110/73  Pulse: 77  Resp: 20  Temp: (!) 97.1 F (36.2 C)  TempSrc: Tympanic  SpO2: 100%  Weight: 173 lb 12.8 oz (78.8 kg)   Physical Exam Constitutional:      General: He is not in acute distress. Eyes:     Extraocular Movements: EOM normal.  Cardiovascular:     Rate and  Rhythm: Normal rate and regular rhythm.     Heart sounds: Normal heart sounds.  Pulmonary:     Effort: Pulmonary effort is normal.     Breath sounds: Normal breath sounds.  Abdominal:     General: Bowel sounds are normal.     Palpations: Abdomen is soft.  Skin:    General: Skin is warm and dry.  Neurological:     Mental Status: He is alert and oriented to person, place, and time.      CMP Latest Ref Rng & Units 01/29/2021  Glucose 70 - 99 mg/dL 97  BUN 6 - 20 mg/dL 13  Creatinine 0.61 - 1.24 mg/dL 0.96  Sodium 135 - 145 mmol/L 135  Potassium 3.5 - 5.1 mmol/L 3.7  Chloride 98 - 111 mmol/L 101  CO2 22 - 32 mmol/L 25  Calcium 8.9 - 10.3  mg/dL 8.9  Total Protein 6.5 - 8.1 g/dL 7.4  Total Bilirubin 0.3 - 1.2 mg/dL 0.5  Alkaline Phos 38 - 126 U/L 99  AST 15 - 41 U/L 30  ALT 0 - 44 U/L 33   CBC Latest Ref Rng & Units 01/29/2021  WBC 4.0 - 10.5 K/uL 3.5(L)  Hemoglobin 13.0 - 17.0 g/dL 12.6(L)  Hematocrit 39.0 - 52.0 % 36.9(L)  Platelets 150 - 400 K/uL 295      Assessment and plan- Patient is a 50 y.o. male stage IV adenocarcinoma of the lung with pleural metastases.He now presents with a large neck mass with biopsy consistent with previously diagnosed non-small cell lung cancer.    He is here for on treatment assessment prior to cycle 4 of maintenance [name]  During last cycle patient's ANC was 1 and he received on pro-Neulasta at home.  Today his white count is 3.5 with an ANC of 0.6.  I will therefore hold off on giving him treatment today.  I will lower the dose of [name] to 375 mg per metered squared with next cycle.  He will continue to receive on pro-Neulasta with every cycle.  I will see him back tentatively in 2 weeks time with CBC with differential and CMP.  He will get [name] and is also due for his repeat staging scans on that day   Visit Diagnosis 1. Chemotherapy induced neutropenia (HCC)   2. Malignant neoplasm of upper lobe of left lung ([place])      Dr. Randa Evens, MD, MPH [facility] [phone] 01/29/2021 9:33 AM

## 2021-02-11 ENCOUNTER — Other Ambulatory Visit: Payer: Self-pay

## 2021-02-11 ENCOUNTER — Ambulatory Visit
Admission: RE | Admit: 2021-02-11 | Discharge: 2021-02-11 | Disposition: A | Discharge status: Home / Self Care | Payer: Medicaid Other | Source: Ambulatory Visit | Attending: Oncology | Admitting: Oncology

## 2021-02-11 DIAGNOSIS — R9389 Abnormal findings on diagnostic imaging of other specified body structures: Secondary | ICD-10-CM | POA: Diagnosis present

## 2021-02-11 DIAGNOSIS — C3412 Malignant neoplasm of upper lobe, left bronchus or lung: Secondary | ICD-10-CM | POA: Diagnosis not present

## 2021-02-11 DIAGNOSIS — C3492 Malignant neoplasm of unspecified part of left bronchus or lung: Secondary | ICD-10-CM | POA: Insufficient documentation

## 2021-02-11 IMAGING — CT CT CHEST-ABD-PELV W/ CM
2 of 4 series · 15 of 36 positions shown, 17 images · IV contrast (omnipaque)
Comparison: [DATE]

CLINICAL DATA: Restaging metastatic lung cancer

EXAM:
CT CHEST, ABDOMEN, AND PELVIS WITH CONTRAST
TECHNIQUE: Multidetector CT imaging of the chest, abdomen and pelvis was
performed following the standard protocol during bolus
administration of intravenous contrast.
CONTRAST:  100mL OMNIPAQUE IOHEXOL 300 MG/ML  SOLN

[Series 3: cap with · axial · 0.73mm/px · z∈[-322,+268]mm · 12 of 134 slices shown, 14 images]
[im 8/134  mediastinal]
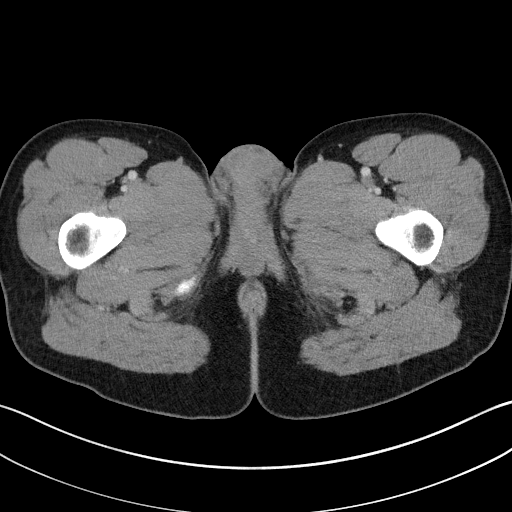
[im 8/134  bone]
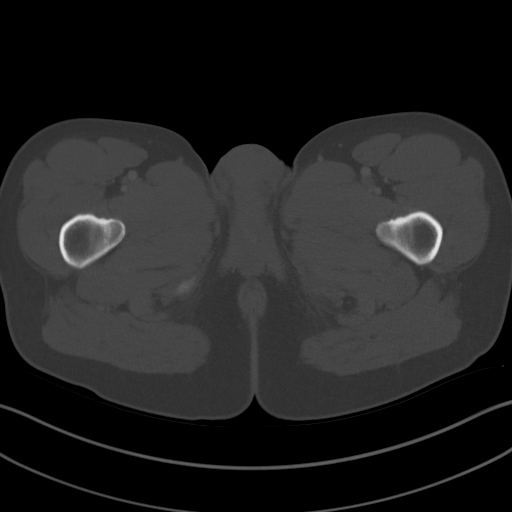
[im 23/134  mediastinal]
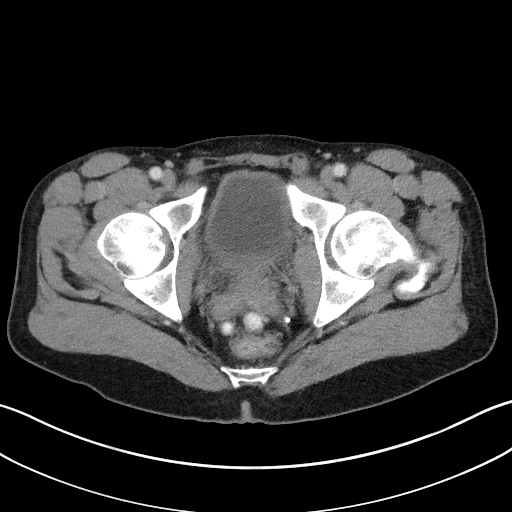
[im 30/134  mediastinal]
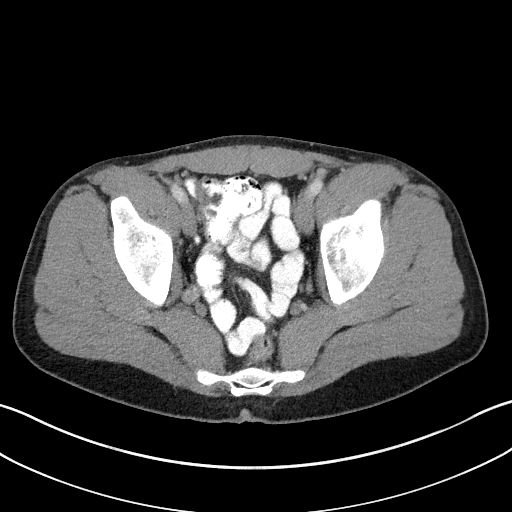
[im 37/134  mediastinal]
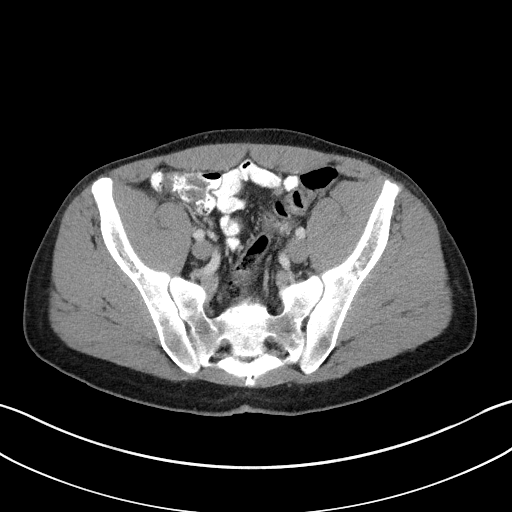
[im 52/134  mediastinal]
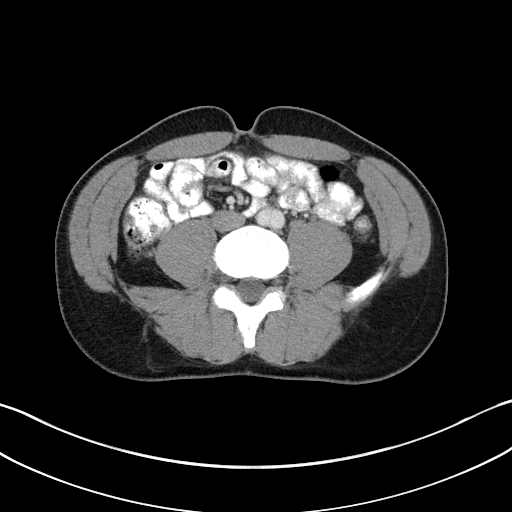
[im 60/134  mediastinal]
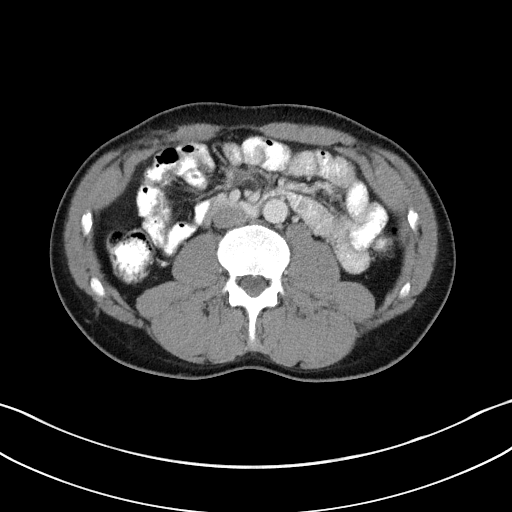
[im 74/134  mediastinal]
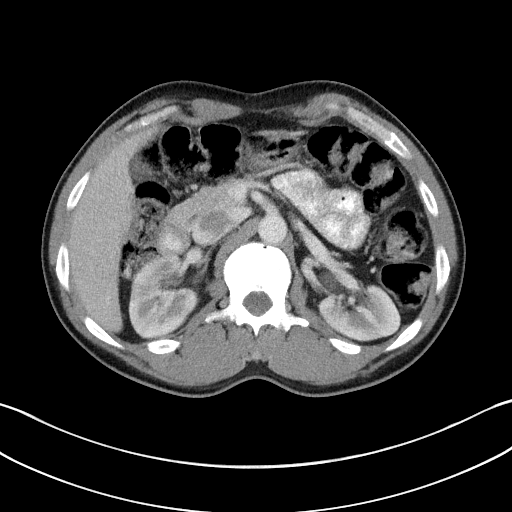
[im 82/134  mediastinal]
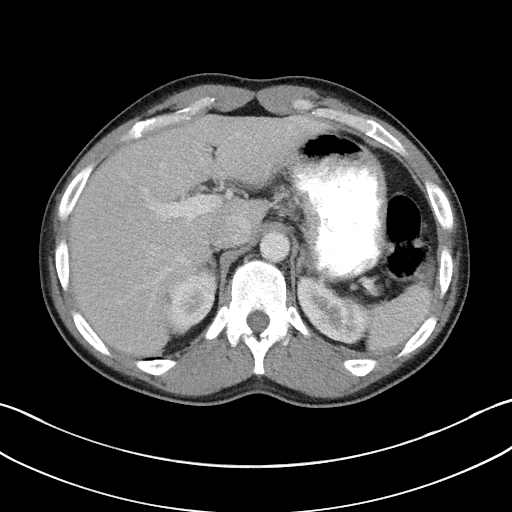
[im 97/134  mediastinal]
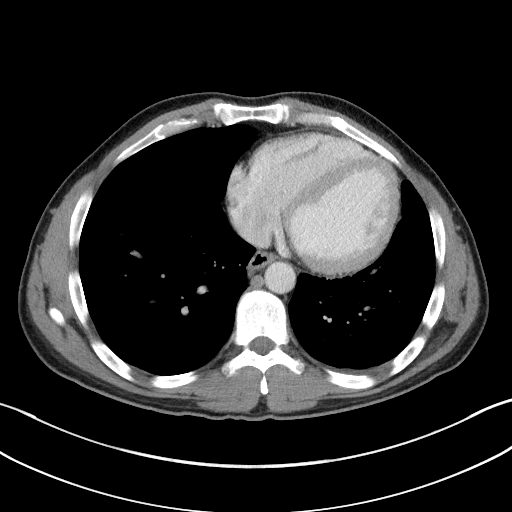
[im 97/134  bone]
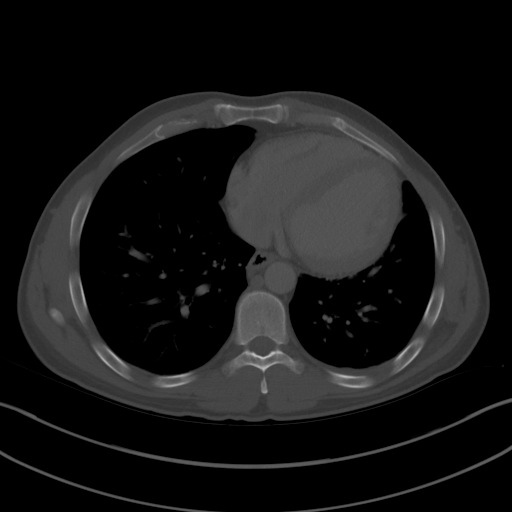
[im 104/134  mediastinal]
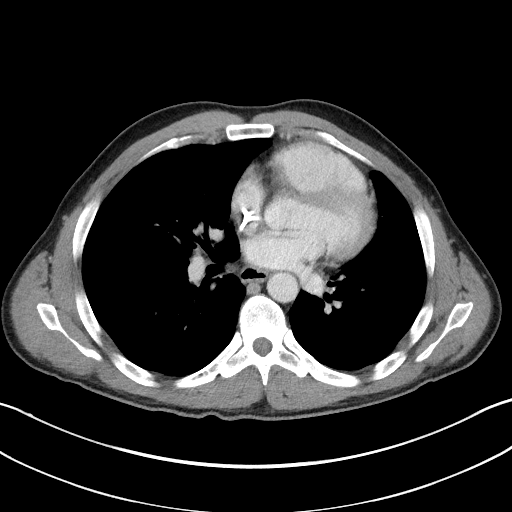
[im 111/134  mediastinal]
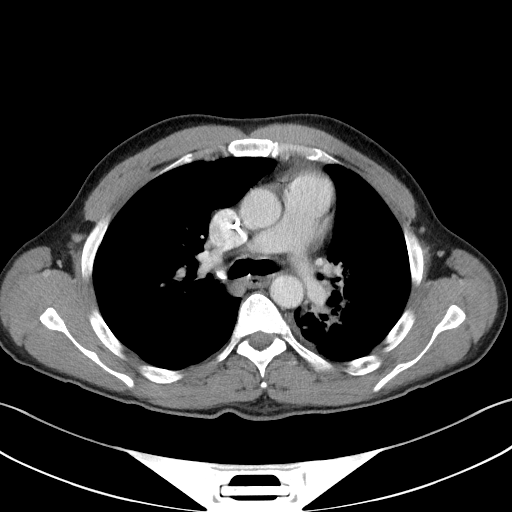
[im 126/134  mediastinal]
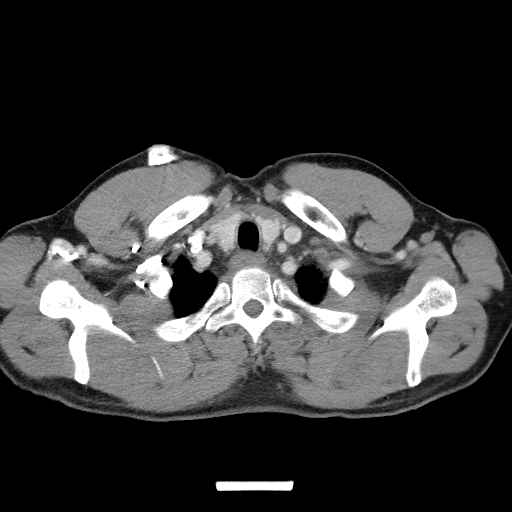

[Series 6: coronals · coronal · 0.81mm/px · 3 of 151 slices shown]
[im 31/151  mediastinal]
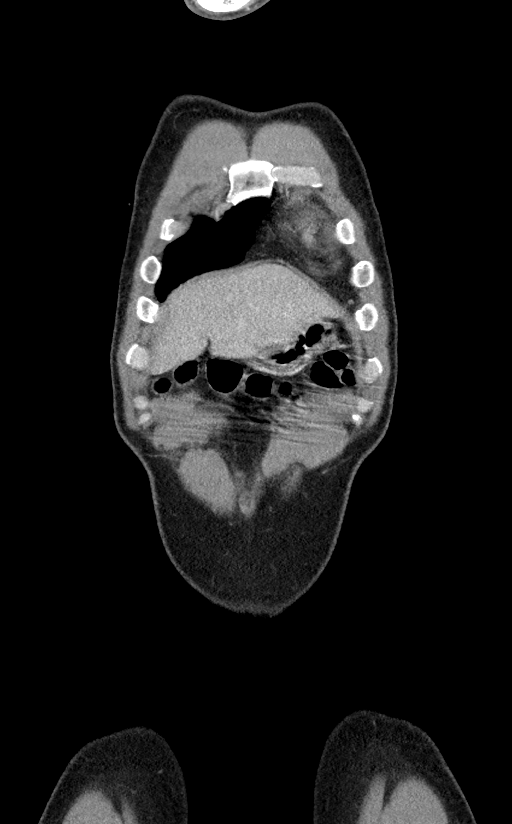
[im 61/151  mediastinal]
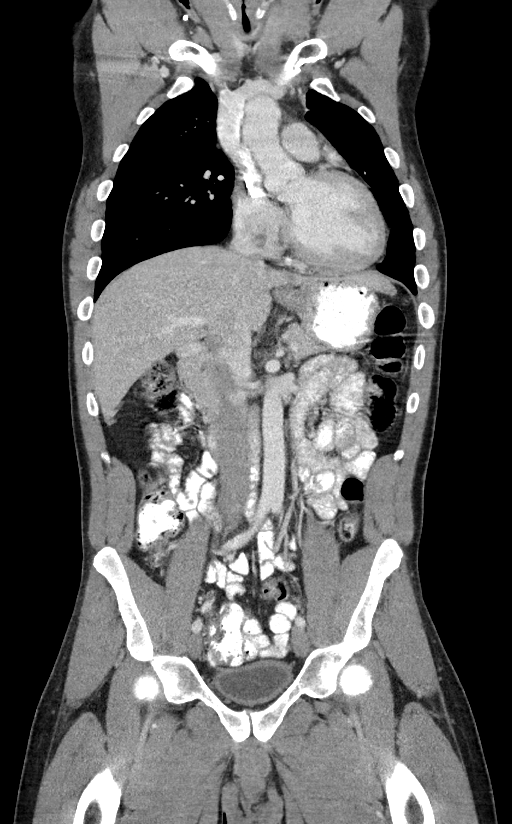
[im 91/151  mediastinal]
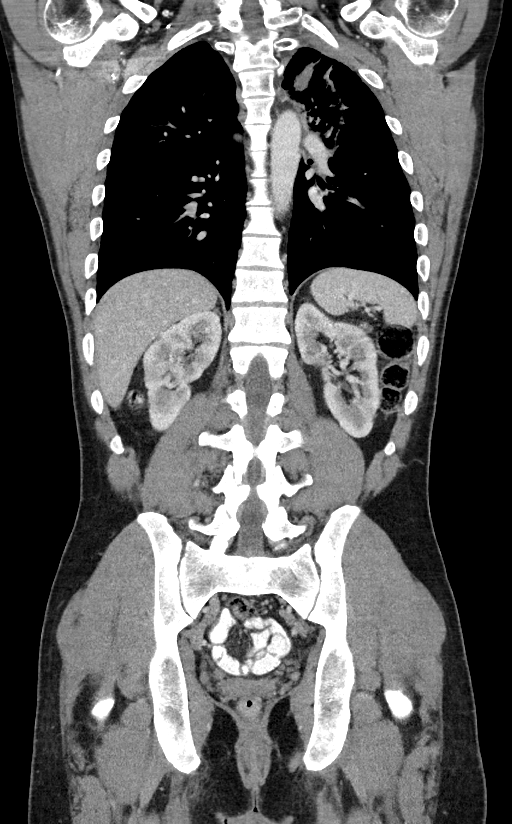

[15 of 36 positions shown; findings below may reference images not displayed]

FINDINGS: CT CHEST FINDINGS

Cardiovascular: The heart size appears within normal limits. There
is no pericardial effusion identified.

Mediastinum/Nodes: Normal appearance of the thyroid gland. The
trachea appears patent and is midline. Normal appearance of the
esophagus.

No enlarged axillary, supraclavicular, mediastinal, or hilar
adenopathy.

Lungs/Pleura: No pleural effusion. The round solid nodule within the
left apex measures 1.6 x 1.2 x 2.1 cm (volume = 2.1 cm^3), image
[DATE]. previously this measured 1.4 by 1.0 x 2.6 cm (volume =
cm^3). Increase in surrounding consolidation, architectural
distortion and fibrosis.

Musculoskeletal: No chest wall mass or suspicious bone lesions
identified.

CT ABDOMEN PELVIS FINDINGS

Hepatobiliary: No focal liver abnormality is seen. No gallstones,
gallbladder wall thickening, or biliary dilatation.

Pancreas: Unremarkable. No pancreatic ductal dilatation or
surrounding inflammatory changes.

Spleen: Normal in size without focal abnormality.

Adrenals/Urinary Tract: Normal appearance of the adrenal glands. No
kidney mass or hydronephrosis. Urinary bladder is unremarkable.

Stomach/Bowel: Stomach is within normal limits. Appendix appears
normal. No evidence of bowel wall thickening, distention, or
inflammatory changes.

Vascular/Lymphatic: No significant vascular findings are present. No
enlarged abdominal or pelvic lymph nodes.

Reproductive: Prostate is unremarkable.

Other: No free fluid or fluid collections identified within the
abdomen or pelvis.

Musculoskeletal: No acute or significant osseous findings.
IMPRESSION: 1. No significant change in size of left upper lobe pulmonary nodule
with progressive changes of external beam radiation in the
surrounding the left lung.
2. No findings of metastatic disease within the abdomen or pelvis.

## 2021-02-11 IMAGING — CT CT NECK W/ CM
3 of 5 series · 12 of 33 positions shown, 14 images · IV contrast (omnipaque)
Comparison: [DATE]

CLINICAL DATA: Metastatic lung cancer surveillance

EXAM:
CT NECK WITH CONTRAST
TECHNIQUE: Multidetector CT imaging of the neck was performed using the
standard protocol following the bolus administration of intravenous
contrast.
CONTRAST:  100mL OMNIPAQUE IOHEXOL 300 MG/ML  SOLN

[Series 7: sag neck · sagittal · 0.54mm/px · 5 of 101 slices shown, 6 images]
[im 34/101  bone]
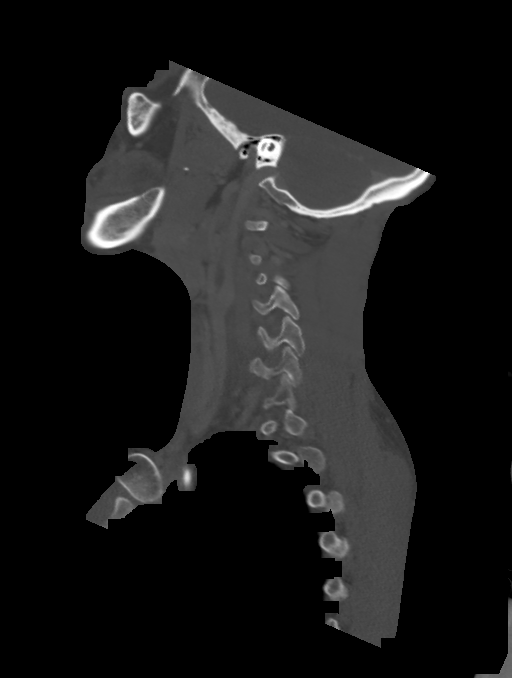
[im 42/101  bone]
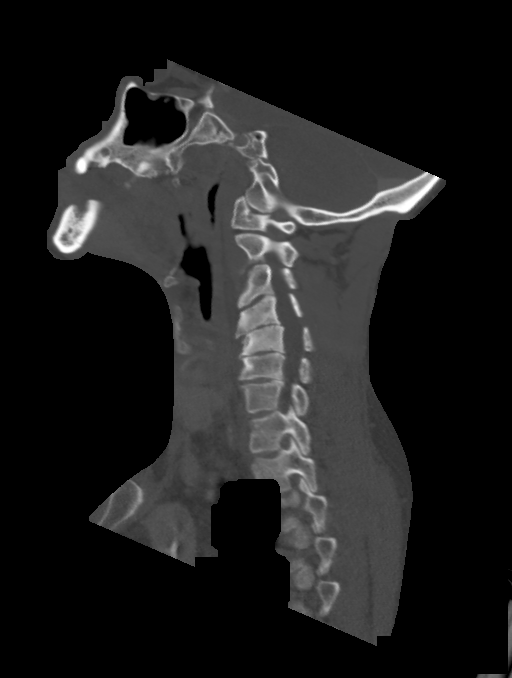
[im 51/101  soft-tissue]
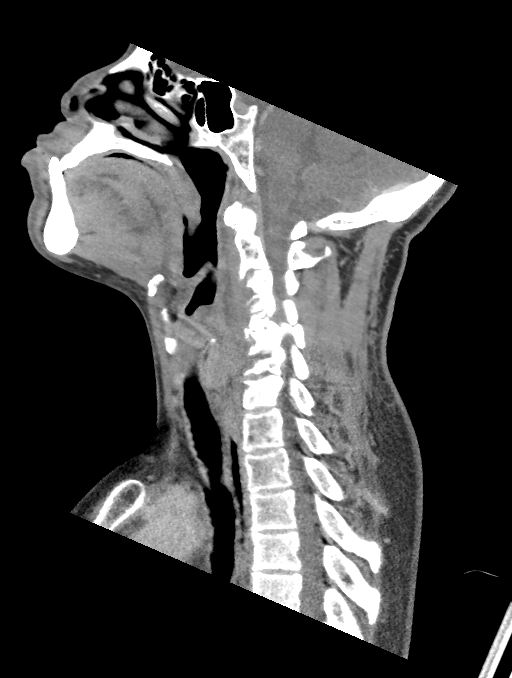
[im 51/101  bone]
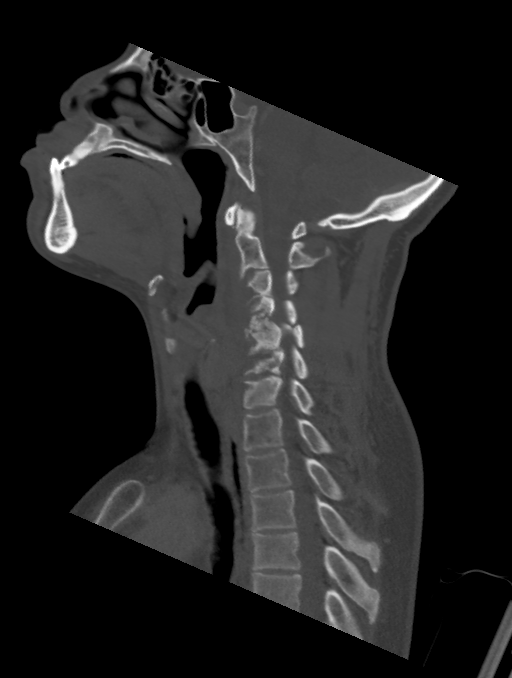
[im 59/101  bone]
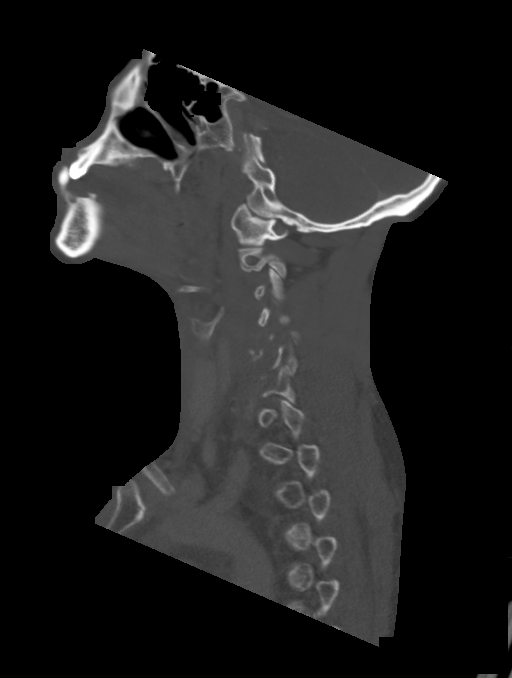
[im 67/101  bone]
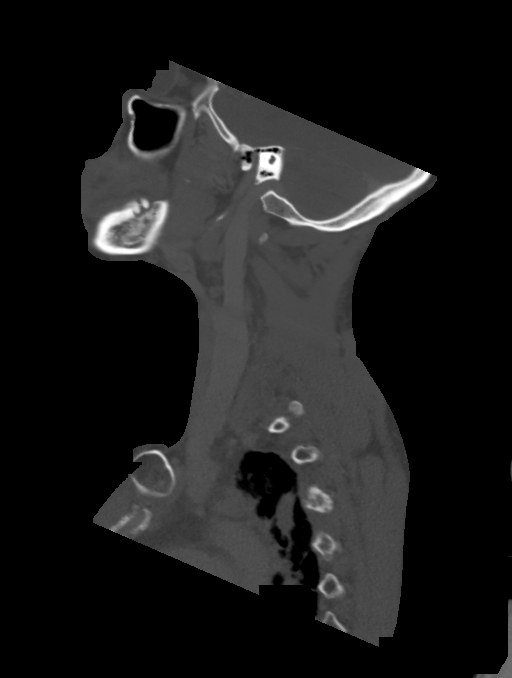

[Series 8: cor neck · coronal · 0.46mm/px · 3 of 128 slices shown]
[im 49/128  bone]
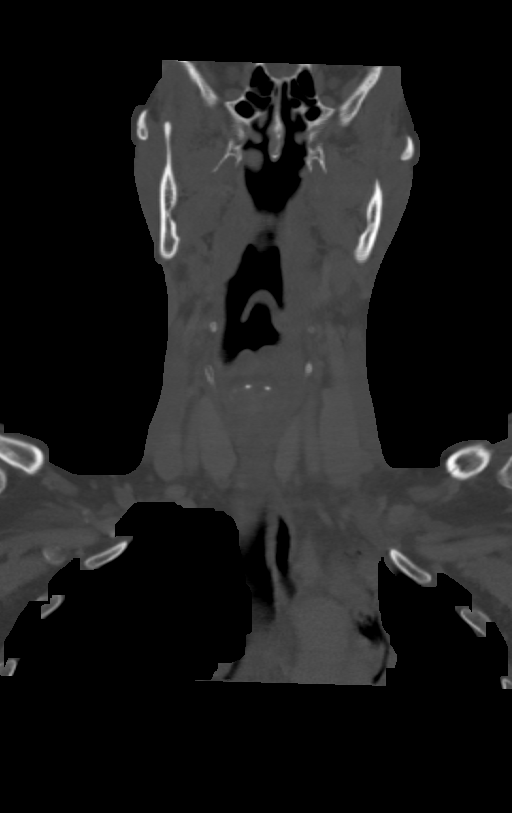
[im 59/128  bone]
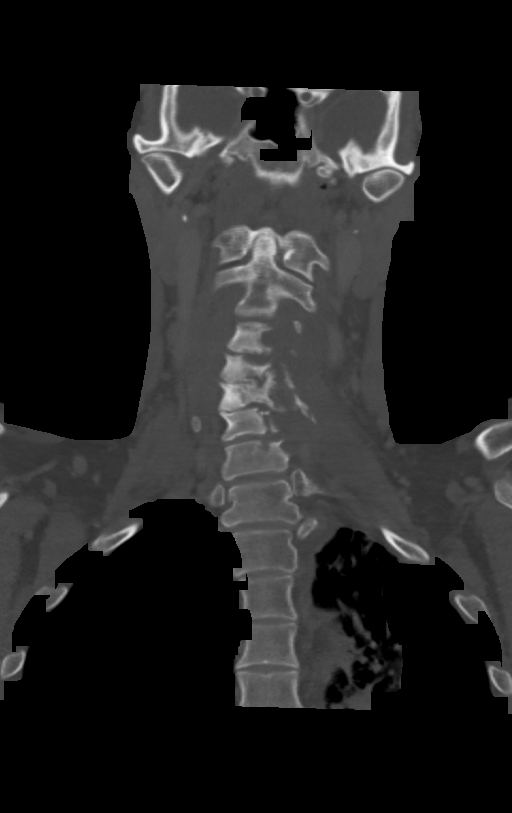
[im 69/128  bone]
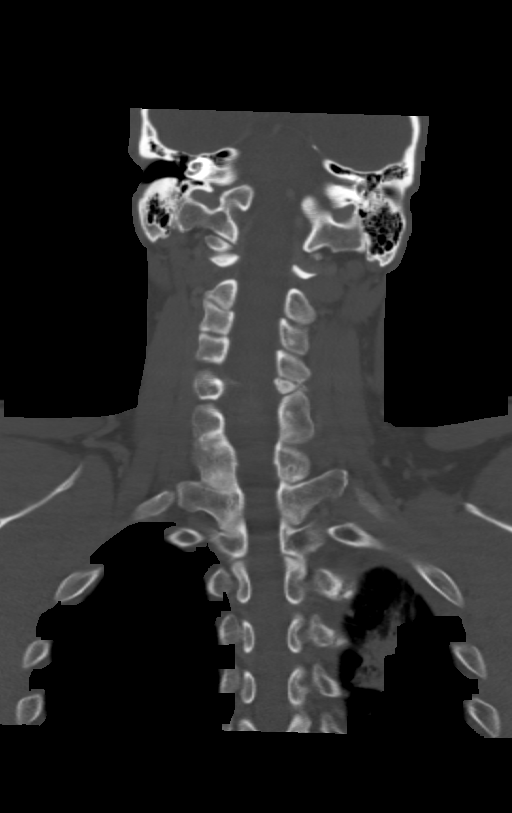

[Series 9: orthogonal ax · axial · 0.39mm/px · z∈[+157,+344]mm · 4 of 163 slices shown, 5 images]
[im 28/163  soft-tissue]
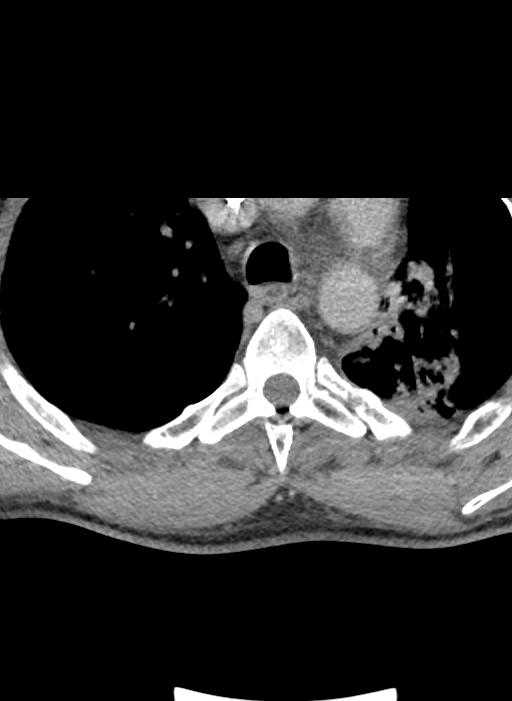
[im 28/163  bone]
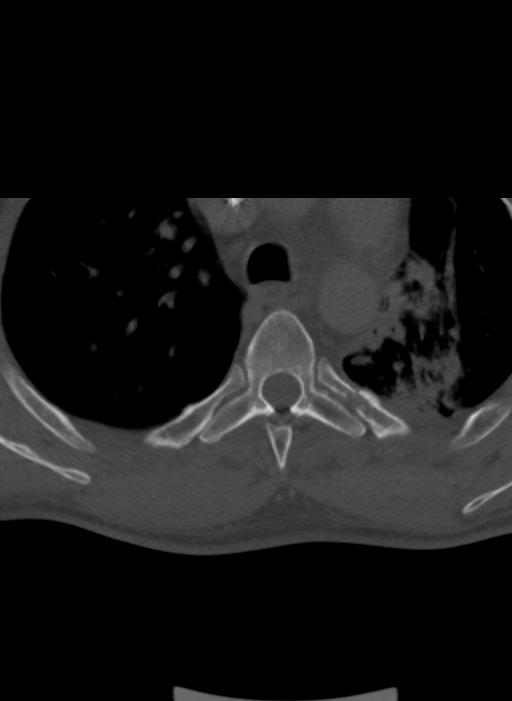
[im 55/163  bone]
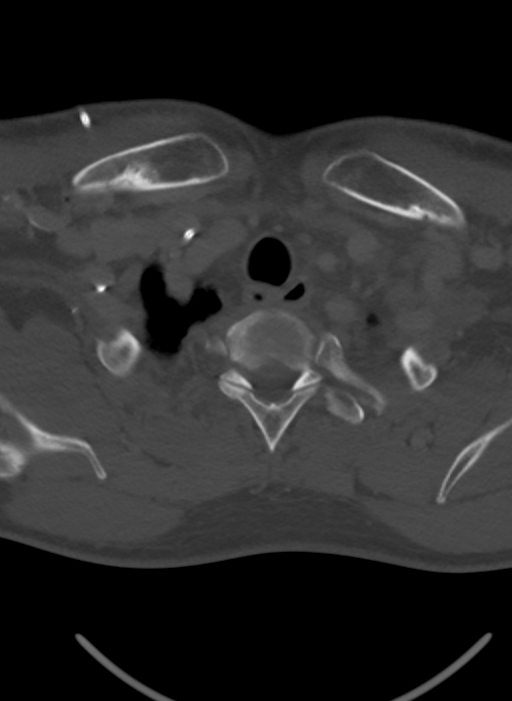
[im 109/163  bone]
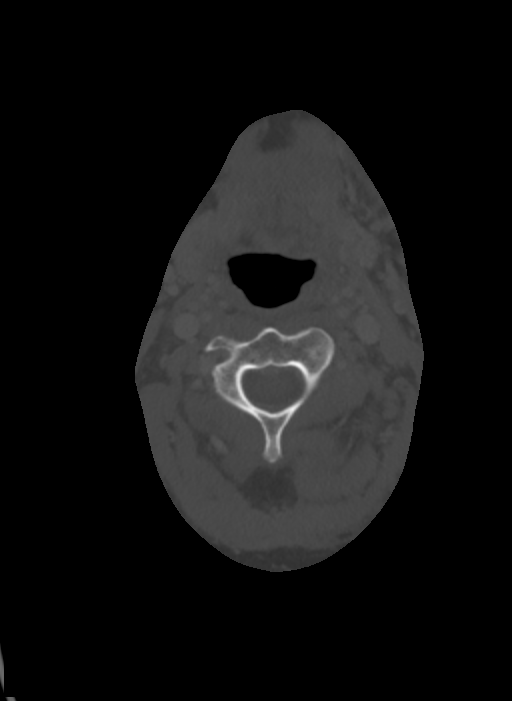
[im 136/163  bone]
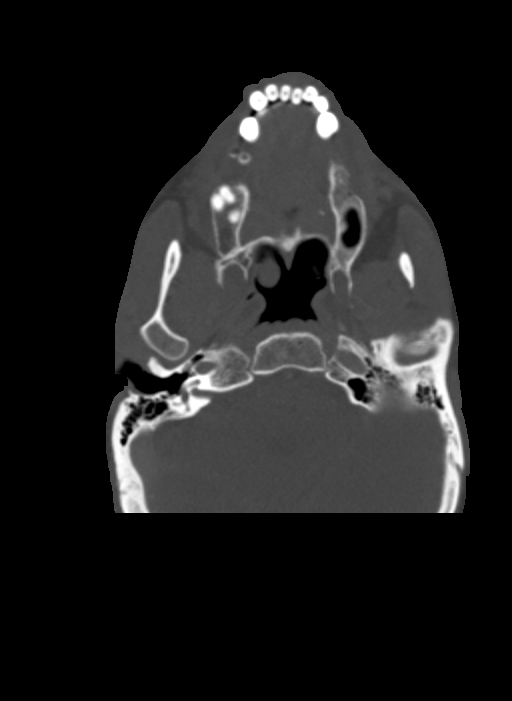

[12 of 33 positions shown; findings below may reference images not displayed]

FINDINGS: PHARYNX AND LARYNX: The nasopharynx, oropharynx and larynx are
normal. Visible portions of the oral cavity, tongue base and floor
of mouth are normal. Normal epiglottis, vallecula and pyriform
sinuses. Slight medialization of the left vocal fold. No
retropharyngeal abscess, effusion or lymphadenopathy.

SALIVARY GLANDS: Mild hyperdensity compatible with prior radiation
treatment.

THYROID: Normal.

LYMPH NODES: Decreased size of left supraclavicular nodes now
measuring 7 mm, previously 8 mm. No new lymphadenopathy.

VASCULAR: Major cervical vessels are patent.

LIMITED INTRACRANIAL: Normal.

VISUALIZED ORBITS: Normal.

MASTOIDS AND VISUALIZED PARANASAL SINUSES: No fluid levels or
advanced mucosal thickening. No mastoid effusion.

SKELETON: Unchanged lytic appearance within the left mid cervical
spine.Associated soft tissue mass is difficult to distinguish from
surrounding muscles but appears smaller.

UPPER CHEST: Concomitant chest CT reported separately.

OTHER: None.
IMPRESSION: 1. Decreased size of left supraclavicular lymph nodes. No new
lymphadenopathy.
2. Unchanged lytic appearance within the left mid cervical spine.
Associated soft tissue mass is difficult to distinguish from
surrounding muscles but appears smaller.

## 2021-02-11 MED ORDER — IOHEXOL 300 MG/ML  SOLN
100.0000 mL | Freq: Once | INTRAMUSCULAR | Status: AC | PRN
  Administered 2021-02-11: 100 mL via INTRAVENOUS

## 2021-02-12 ENCOUNTER — Inpatient Hospital Stay: Payer: Medicaid Other

## 2021-02-12 ENCOUNTER — Encounter: Payer: Self-pay | Admitting: Oncology

## 2021-02-12 ENCOUNTER — Ambulatory Visit: Payer: Self-pay

## 2021-02-12 ENCOUNTER — Inpatient Hospital Stay (HOSPITAL_BASED_OUTPATIENT_CLINIC_OR_DEPARTMENT_OTHER): Payer: Medicaid Other | Admitting: Oncology

## 2021-02-12 VITALS — BP 122/82 | HR 80

## 2021-02-12 VITALS — BP 123/83 | HR 82 | Temp 97.8°F | Wt 178.1 lb

## 2021-02-12 DIAGNOSIS — D701 Agranulocytosis secondary to cancer chemotherapy: Secondary | ICD-10-CM | POA: Diagnosis not present

## 2021-02-12 DIAGNOSIS — Z5111 Encounter for antineoplastic chemotherapy: Secondary | ICD-10-CM

## 2021-02-12 DIAGNOSIS — C3412 Malignant neoplasm of upper lobe, left bronchus or lung: Secondary | ICD-10-CM

## 2021-02-12 DIAGNOSIS — T451X5A Adverse effect of antineoplastic and immunosuppressive drugs, initial encounter: Secondary | ICD-10-CM | POA: Diagnosis not present

## 2021-02-12 LAB — CBC WITH DIFFERENTIAL/PLATELET
Abs Immature Granulocytes: 0.01 10*3/uL (ref 0.00–0.07)
Basophils Absolute: 0 10*3/uL (ref 0.0–0.1)
Basophils Relative: 1 %
Eosinophils Absolute: 0.2 10*3/uL (ref 0.0–0.5)
Eosinophils Relative: 6 %
HCT: 36.5 % — ABNORMAL LOW (ref 39.0–52.0)
Hemoglobin: 12.1 g/dL — ABNORMAL LOW (ref 13.0–17.0)
Immature Granulocytes: 0 %
Lymphocytes Relative: 43 %
Lymphs Abs: 1.6 10*3/uL (ref 0.7–4.0)
MCH: 31.6 pg (ref 26.0–34.0)
MCHC: 33.2 g/dL (ref 30.0–36.0)
MCV: 95.3 fL (ref 80.0–100.0)
Monocytes Absolute: 0.8 10*3/uL (ref 0.1–1.0)
Monocytes Relative: 21 %
Neutro Abs: 1 10*3/uL — ABNORMAL LOW (ref 1.7–7.7)
Neutrophils Relative %: 29 %
Platelets: 257 10*3/uL (ref 150–400)
RBC: 3.83 MIL/uL — ABNORMAL LOW (ref 4.22–5.81)
RDW: 14 % (ref 11.5–15.5)
WBC: 3.6 10*3/uL — ABNORMAL LOW (ref 4.0–10.5)
nRBC: 0 % (ref 0.0–0.2)

## 2021-02-12 LAB — COMPREHENSIVE METABOLIC PANEL
ALT: 21 U/L (ref 0–44)
AST: 25 U/L (ref 15–41)
Albumin: 3.7 g/dL (ref 3.5–5.0)
Alkaline Phosphatase: 100 U/L (ref 38–126)
Anion gap: 11 (ref 5–15)
BUN: 11 mg/dL (ref 6–20)
CO2: 26 mmol/L (ref 22–32)
Calcium: 8.9 mg/dL (ref 8.9–10.3)
Chloride: 101 mmol/L (ref 98–111)
Creatinine, Ser: 1.09 mg/dL (ref 0.61–1.24)
GFR, Estimated: >60 mL/min (ref >60)
Glucose, Bld: 81 mg/dL (ref 70–99)
Potassium: 3.9 mmol/L (ref 3.5–5.1)
Sodium: 138 mmol/L (ref 135–145)
Total Bilirubin: 0.8 mg/dL (ref 0.3–1.2)
Total Protein: 7.4 g/dL (ref 6.5–8.1)

## 2021-02-12 MED ORDER — SODIUM CHLORIDE 0.9 % IV SOLN
375.0000 mg/m2 | Freq: Once | INTRAVENOUS | Status: AC
  Administered 2021-02-12: 750 mg via INTRAVENOUS
  Filled 2021-02-12: qty 20

## 2021-02-12 MED ORDER — SODIUM CHLORIDE 0.9% FLUSH
10.0000 mL | INTRAVENOUS | Status: DC | PRN
  Administered 2021-02-12: 10 mL
  Filled 2021-02-12: qty 10

## 2021-02-12 MED ORDER — SODIUM CHLORIDE 0.9 % IV SOLN
10.0000 mg | Freq: Once | INTRAVENOUS | Status: AC
  Administered 2021-02-12: 10 mg via INTRAVENOUS
  Filled 2021-02-12: qty 10

## 2021-02-12 MED ORDER — SODIUM CHLORIDE 0.9 % IV SOLN
Freq: Once | INTRAVENOUS | Status: AC
Start: 2021-02-12 — End: 2021-02-12
  Filled 2021-02-12: qty 250

## 2021-02-12 MED ORDER — HEPARIN SOD (PORK) LOCK FLUSH 100 UNIT/ML IV SOLN
500.0000 [IU] | Freq: Once | INTRAVENOUS | Status: AC | PRN
  Administered 2021-02-12: 500 [IU]
  Filled 2021-02-12: qty 5

## 2021-02-12 MED ORDER — PEGFILGRASTIM 6 MG/0.6ML ~~LOC~~ PSKT
6.0000 mg | PREFILLED_SYRINGE | Freq: Once | SUBCUTANEOUS | Status: AC
  Administered 2021-02-12: 6 mg via SUBCUTANEOUS
  Filled 2021-02-12: qty 0.6

## 2021-02-12 MED ORDER — CYANOCOBALAMIN 1000 MCG/ML IJ SOLN
1000.0000 ug | Freq: Once | INTRAMUSCULAR | Status: AC
  Administered 2021-02-12: 1000 ug via INTRAMUSCULAR
  Filled 2021-02-12: qty 1

## 2021-02-12 NOTE — Progress Notes (Signed)
Patient received prescribed treatment in clinic. Tolerated well. Patient stable at discharge. 

## 2021-02-12 NOTE — Addendum Note (Signed)
Addended by: Sindy Guadeloupe on: 02/12/2021 09:47 AM   Modules accepted: Orders

## 2021-02-12 NOTE — Progress Notes (Signed)
Patient denies new problems/concerns today.   °

## 2021-02-12 NOTE — Progress Notes (Signed)
Hematology/Oncology Consult note [facility]  [phone] [phone]  Patient Care Team: Default, Provider, MD as PCP - General Telford Nab, RN as Registered Nurse Sindy Guadeloupe, MD as Consulting Physician (Hematology and Oncology) Sindy Guadeloupe, MD as Consulting Physician (Hematology and Oncology)   Name of the patient: Kyle Guerrero  [id number]  02/24/1971   Date of visit: 02/12/21  Diagnosis- Non-small cell lung cancer stage IV acT2 cN2 cM1 a with pleural involvement  Chief complaint/ Reason for visit-on treatment assessment prior to cycle 5 of maintenance [name]  Heme/Onc history:  patient is a 50 year old male who presented to the ER with symptoms ofheaviness in his chest and upper left chest discomfort he underwent CT angio chest to rule out PE which showed 3.8 x 3.3 cm left upper palpable lung mass along with 4.4 x 3.3 cm lobulated mass in the aortopulmonary window and 2.6 x 2.4 cm left hilar mass all concerning for malignancy. Patient has also seen pulmonary and has been set up for bronchoscopy and EBUS guided biopsy on 11/23/2019. Patient underwent. PET CT scan which showed a hypermetabolic spiculated 3.5 cm of 5 left upper lobe lung mass, adjacent hypermetabolic 3.2 x 1.2 cm pleural metastases in the medial posterior by the left pleural space along with scalloping of the adjacent posterior left third rib. Hypermetabolic infiltrative left perihilar conglomerate nodal metastases measuring up to 7.3 x 3.6 cm and 0.8 cm high left mediastinal node between the left brachiocephalic vein and left subclavian artery. No evidence of distant metastatic disease  Biopsy showed non-small cell lung cancer but further characterization could not be determined. Insufficient tissue for NGS testing. Repeat biopsy done. Results of NGStestingshowed PD-L1 50%. Tumor mutational burden high. 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  Patient completed concurrent chemoradiation with carbotaxol chemotherapy followed by 2 cycles of carbotaxol Keytruda andwason maintenance Keytruda  Patient was complaining of left shoulder pain and underwent MRI of the shoulder which showed8 in the supraspinatus tendon. He was subsequently seen by [facility] and underwent MRI of the cervical spine which showed a 4.6 x 4.8 x 8.2 cm mass in the left prevertebral region from C2-C7 levels. The mass partially encases the left vertebral artery and abuts the preforaminal segment. Invades the vertebral bodies and edema of the left C5 articular pillar. There is slight extension into the left C4-C5 thecal sac without central thecal sac compression. The mass displaces the left pharynx anteriorly along the left carotid sheath.Patient had a repeat biopsy which was consistent with adenocarcinoma.  Patient underwent palliative radiation treatment to his neck mass and completed 4 cycles of carboplatin and [name] with excellent response to treatment and is currently on maintenance [name]. Repeat NGS testing was also performed which did not show any evidence of actionable mutations  Interval history-patient is doing well overall and denies any complaints at this time.  ECOG PS- 0 Pain scale- 0 Opioid associated constipation- no  Review of systems- Review of Systems  Constitutional: Negative for chills, fever, malaise/fatigue and weight loss.  HENT: Negative for congestion, ear discharge and nosebleeds.   Eyes: Negative for blurred vision.  Respiratory: Negative for cough, hemoptysis, sputum production, shortness of breath and wheezing.   Cardiovascular: Negative for chest pain, palpitations, orthopnea and claudication.  Gastrointestinal: Negative for abdominal pain, blood in stool, constipation, diarrhea, heartburn, melena, nausea and vomiting.  Genitourinary: Negative for dysuria, flank pain, frequency, hematuria and  urgency.  Musculoskeletal: Negative  for back pain, joint pain and myalgias.  Skin: Negative for rash.  Neurological: Negative for dizziness, tingling, focal weakness, seizures, weakness and headaches.  Endo/Heme/Allergies: Does not bruise/bleed easily.  Psychiatric/Behavioral: Negative for depression and suicidal ideas. The patient does not have insomnia.       No Known Allergies   Past Medical History:  Diagnosis Date  . Asthma   . Lung cancer [facility])      Past Surgical History:  Procedure Laterality Date  . CYST EXCISION    . IR IMAGING GUIDED PORT INSERTION  12/05/2019  . VIDEO BRONCHOSCOPY WITH ENDOBRONCHIAL ULTRASOUND Left 11/22/2019   Procedure: VIDEO BRONCHOSCOPY WITH ENDOBRONCHIAL ULTRASOUND;  Surgeon: Tyler Pita, MD;  Location: [facility] ORS;  Service: Thoracic;  Laterality: Left;    Social History   Socioeconomic History  . Marital status: Single    Spouse name: Not on file  . Number of children: Not on file  . Years of education: Not on file  . Highest education level: Not on file  Occupational History  . Not on file  Tobacco Use  . Smoking status: Former Smoker    Packs/day: 2.00    Types: Cigarettes    Quit date: 11/08/2019    Years since quitting: 1.2  . Smokeless tobacco: Never Used  Vaping Use  . Vaping Use: Never used  Substance and Sexual Activity  . Alcohol use: Not Currently  . Drug use: Yes    Types: Marijuana  . Sexual activity: Not on file  Other Topics Concern  . Not on file  Social History Narrative  . Not on file   Social Determinants of Health   Financial Resource Strain: Not on file  Food Insecurity: Not on file  Transportation Needs: Not on file  Physical Activity: Not on file  Stress: Not on file  Social Connections: Not on file  Intimate Partner Violence: Not on file    Family History  Problem Relation Age of Onset  . Healthy Mother   . Healthy Father      Current Outpatient Medications:  .  folic acid  (FOLVITE) 1 MG tablet, Take 1 tablet (1 mg total) by mouth daily. Start 5-7 days before [name] chemotherapy. Continue until 21 days after [name] completed., Disp: 100 tablet, Rfl: 3 .  ondansetron (ZOFRAN) 8 MG tablet, Take 1 tablet (8 mg total) by mouth 2 (two) times daily as needed (Nausea or vomiting). Start if needed on the third day after chemotherapy. (Patient not taking: No sig reported), Disp: 30 tablet, Rfl: 1 No current facility-administered medications for this visit.  Facility-Administered Medications Ordered in Other Visits:  .  heparin lock flush 100 unit/mL, 500 Units, Intravenous, Once, Randa Evens C, MD .  sodium chloride flush (NS) 0.9 % injection 10 mL, 10 mL, Intravenous, PRN, Sindy Guadeloupe, MD, 10 mL at 05/01/20 0900 .  sodium chloride flush (NS) 0.9 % injection 10 mL, 10 mL, Intravenous, PRN, Sindy Guadeloupe, MD, 10 mL at 01/29/21 0835  Physical exam:  Vitals:   02/12/21 0853  BP: 123/83  Pulse: 82  Temp: 97.8 F (36.6 C)  TempSrc: Tympanic  SpO2: 100%  Weight: 178 lb 1.6 oz (80.8 kg)   Physical Exam Constitutional:      General: He is not in acute distress. Eyes:     Extraocular Movements: EOM normal.  Cardiovascular:     Rate and Rhythm: Normal rate and regular rhythm.     Heart sounds: Normal heart sounds.  Pulmonary:  Effort: Pulmonary effort is normal.     Breath sounds: Normal breath sounds.  Abdominal:     General: Bowel sounds are normal.     Palpations: Abdomen is soft.  Skin:    General: Skin is warm and dry.  Neurological:     Mental Status: He is alert and oriented to person, place, and time.      CMP Latest Ref Rng & Units 01/29/2021  Glucose 70 - 99 mg/dL 97  BUN 6 - 20 mg/dL 13  Creatinine 0.61 - 1.24 mg/dL 0.96  Sodium 135 - 145 mmol/L 135  Potassium 3.5 - 5.1 mmol/L 3.7  Chloride 98 - 111 mmol/L 101  CO2 22 - 32 mmol/L 25  Calcium 8.9 - 10.3 mg/dL 8.9  Total Protein 6.5 - 8.1 g/dL 7.4  Total Bilirubin 0.3 - 1.2 mg/dL 0.5   Alkaline Phos 38 - 126 U/L 99  AST 15 - 41 U/L 30  ALT 0 - 44 U/L 33   CBC Latest Ref Rng & Units 02/12/2021  WBC 4.0 - 10.5 K/uL 3.6(L)  Hemoglobin 13.0 - 17.0 g/dL 12.1(L)  Hematocrit 39.0 - 52.0 % 36.5(L)  Platelets 150 - 400 K/uL 257    No images are attached to the encounter.  CT SOFT TISSUE NECK W CONTRAST  Result Date: 02/11/2021 CLINICAL DATA:  Metastatic lung cancer surveillance EXAM: CT NECK WITH CONTRAST TECHNIQUE: Multidetector CT imaging of the neck was performed using the standard protocol following the bolus administration of intravenous contrast. CONTRAST:  128m OMNIPAQUE IOHEXOL 300 MG/ML  SOLN COMPARISON:  11/10/2020 FINDINGS: PHARYNX AND LARYNX: The nasopharynx, oropharynx and larynx are normal. Visible portions of the oral cavity, tongue base and floor of mouth are normal. Normal epiglottis, vallecula and pyriform sinuses. Slight medialization of the left vocal fold. No retropharyngeal abscess, effusion or lymphadenopathy. SALIVARY GLANDS: Mild hyperdensity compatible with prior radiation treatment. THYROID: Normal. LYMPH NODES: Decreased size of left supraclavicular nodes now measuring 7 mm, previously 8 mm. No new lymphadenopathy. VASCULAR: Major cervical vessels are patent. LIMITED INTRACRANIAL: Normal. VISUALIZED ORBITS: Normal. MASTOIDS AND VISUALIZED PARANASAL SINUSES: No fluid levels or advanced mucosal thickening. No mastoid effusion. SKELETON: Unchanged lytic appearance within the left mid cervical spine.Associated soft tissue mass is difficult to distinguish from surrounding muscles but appears smaller. UPPER CHEST: Concomitant chest CT reported separately. OTHER: None. IMPRESSION: 1. Decreased size of left supraclavicular lymph nodes. No new lymphadenopathy. 2. Unchanged lytic appearance within the left mid cervical spine. Associated soft tissue mass is difficult to distinguish from surrounding muscles but appears smaller. Electronically Signed   By: KUlyses JarredM.D.    On: 02/11/2021 20:51   CT CHEST ABDOMEN PELVIS W CONTRAST  Result Date: 02/11/2021 CLINICAL DATA:  Restaging metastatic lung cancer EXAM: CT CHEST, ABDOMEN, AND PELVIS WITH CONTRAST TECHNIQUE: Multidetector CT imaging of the chest, abdomen and pelvis was performed following the standard protocol during bolus administration of intravenous contrast. CONTRAST:  1051mOMNIPAQUE IOHEXOL 300 MG/ML  SOLN COMPARISON:  11/10/2020 FINDINGS: CT CHEST FINDINGS Cardiovascular: The heart size appears within normal limits. There is no pericardial effusion identified. Mediastinum/Nodes: Normal appearance of the thyroid gland. The trachea appears patent and is midline. Normal appearance of the esophagus. No enlarged axillary, supraclavicular, mediastinal, or hilar adenopathy. Lungs/Pleura: No pleural effusion. The round solid nodule within the left apex measures 1.6 x 1.2 x 2.1 cm (volume = 2.1 cm^3), image 13/5. previously this measured 1.4 by 1.0 x 2.6 cm (volume = 1.9 cm^3). Increase in  surrounding consolidation, architectural distortion and fibrosis. Musculoskeletal: No chest wall mass or suspicious bone lesions identified. CT ABDOMEN PELVIS FINDINGS Hepatobiliary: No focal liver abnormality is seen. No gallstones, gallbladder wall thickening, or biliary dilatation. Pancreas: Unremarkable. No pancreatic ductal dilatation or surrounding inflammatory changes. Spleen: Normal in size without focal abnormality. Adrenals/Urinary Tract: Normal appearance of the adrenal glands. No kidney mass or hydronephrosis. Urinary bladder is unremarkable. Stomach/Bowel: Stomach is within normal limits. Appendix appears normal. No evidence of bowel wall thickening, distention, or inflammatory changes. Vascular/Lymphatic: No significant vascular findings are present. No enlarged abdominal or pelvic lymph nodes. Reproductive: Prostate is unremarkable. Other: No free fluid or fluid collections identified within the abdomen or pelvis.  Musculoskeletal: No acute or significant osseous findings. IMPRESSION: 1. No significant change in size of left upper lobe pulmonary nodule with progressive changes of external beam radiation in the surrounding the left lung. 2. No findings of metastatic disease within the abdomen or pelvis. Electronically Signed   By: Kerby Moors M.D.   On: 02/11/2021 19:36     Assessment and plan- Patient is a 49 y.o. male with metastatic adenocarcinoma of the lung with pleural metastases then soft tissue involvement of the neck as well as cervical vertebrae here for on treatment assessment prior to cycle 4 of maintenance [name]  Patient last received [name] about 5 weeks ago.  His ANC at that time was 1 and he received on for Neulasta.  Subsequently his [place] dropped down to 0.6 at 3 weeks and he did not receive treatment.  His ANC is back to 1 today.  He will therefore proceed with [name] but I will give him a lower dose of 375 mg per metered square along with on pro-Neulasta support.  I will see him back in 3 weeks for cycle 5 of [name] but if he continues to have an [place] of less than 1 I will consider switching his [name] out to every 4 weeks.  I have reviewed CT chest abdomen pelvis images independently and discussed findings with the patient.  Patient continues to have good response to treatment with [name].Soft tissue density in the neck has overall decreased with stable findings of mid cervical spine lytic changes.  Left upper lobe nodule is similar at around 1.6 cm.  Left supraclavicular lymph node is smaller.  No other evidence of metastatic disease.  Plan is to continue with [name] until progression or toxicity  He will receive his B12 injection today as well.   Visit Diagnosis 1. Encounter for antineoplastic chemotherapy   2. Chemotherapy induced neutropenia (HCC)   3. Malignant neoplasm of upper lobe of left lung ([place])      Dr. Randa Evens, MD, MPH [facility] [phone] 02/12/2021 9:14 AM

## 2021-03-05 ENCOUNTER — Encounter: Payer: Self-pay | Admitting: Oncology

## 2021-03-05 ENCOUNTER — Inpatient Hospital Stay: Payer: Medicaid Other

## 2021-03-05 ENCOUNTER — Inpatient Hospital Stay: Payer: Medicaid Other | Attending: Oncology

## 2021-03-05 ENCOUNTER — Inpatient Hospital Stay (HOSPITAL_BASED_OUTPATIENT_CLINIC_OR_DEPARTMENT_OTHER): Payer: Medicaid Other | Admitting: Oncology

## 2021-03-05 VITALS — BP 117/93 | HR 85 | Temp 98.3°F | Resp 16 | Ht 71.0 in | Wt 180.7 lb

## 2021-03-05 VITALS — BP 134/88 | HR 77 | Resp 18

## 2021-03-05 DIAGNOSIS — Z5111 Encounter for antineoplastic chemotherapy: Secondary | ICD-10-CM | POA: Insufficient documentation

## 2021-03-05 DIAGNOSIS — T451X5A Adverse effect of antineoplastic and immunosuppressive drugs, initial encounter: Secondary | ICD-10-CM

## 2021-03-05 DIAGNOSIS — D701 Agranulocytosis secondary to cancer chemotherapy: Secondary | ICD-10-CM | POA: Insufficient documentation

## 2021-03-05 DIAGNOSIS — J45909 Unspecified asthma, uncomplicated: Secondary | ICD-10-CM | POA: Insufficient documentation

## 2021-03-05 DIAGNOSIS — Z87891 Personal history of nicotine dependence: Secondary | ICD-10-CM | POA: Insufficient documentation

## 2021-03-05 DIAGNOSIS — C782 Secondary malignant neoplasm of pleura: Secondary | ICD-10-CM | POA: Insufficient documentation

## 2021-03-05 DIAGNOSIS — C3412 Malignant neoplasm of upper lobe, left bronchus or lung: Secondary | ICD-10-CM

## 2021-03-05 LAB — CBC WITH DIFFERENTIAL/PLATELET
Abs Immature Granulocytes: 0.01 10*3/uL (ref 0.00–0.07)
Basophils Absolute: 0 10*3/uL (ref 0.0–0.1)
Basophils Relative: 1 %
Eosinophils Absolute: 0.2 10*3/uL (ref 0.0–0.5)
Eosinophils Relative: 4 %
HCT: 36.1 % — ABNORMAL LOW (ref 39.0–52.0)
Hemoglobin: 12.1 g/dL — ABNORMAL LOW (ref 13.0–17.0)
Immature Granulocytes: 0 %
Lymphocytes Relative: 37 %
Lymphs Abs: 1.3 10*3/uL (ref 0.7–4.0)
MCH: 31.9 pg (ref 26.0–34.0)
MCHC: 33.5 g/dL (ref 30.0–36.0)
MCV: 95.3 fL (ref 80.0–100.0)
Monocytes Absolute: 0.7 10*3/uL (ref 0.1–1.0)
Monocytes Relative: 20 %
Neutro Abs: 1.3 10*3/uL — ABNORMAL LOW (ref 1.7–7.7)
Neutrophils Relative %: 38 %
Platelets: 361 10*3/uL (ref 150–400)
RBC: 3.79 MIL/uL — ABNORMAL LOW (ref 4.22–5.81)
RDW: 14.2 % (ref 11.5–15.5)
WBC: 3.5 10*3/uL — ABNORMAL LOW (ref 4.0–10.5)
nRBC: 0 % (ref 0.0–0.2)

## 2021-03-05 LAB — COMPREHENSIVE METABOLIC PANEL
ALT: 21 U/L (ref 0–44)
AST: 23 U/L (ref 15–41)
Albumin: 3.9 g/dL (ref 3.5–5.0)
Alkaline Phosphatase: 95 U/L (ref 38–126)
Anion gap: 9 (ref 5–15)
BUN: 13 mg/dL (ref 6–20)
CO2: 25 mmol/L (ref 22–32)
Calcium: 9.2 mg/dL (ref 8.9–10.3)
Chloride: 100 mmol/L (ref 98–111)
Creatinine, Ser: 1.05 mg/dL (ref 0.61–1.24)
GFR, Estimated: >60 mL/min (ref >60)
Glucose, Bld: 115 mg/dL — ABNORMAL HIGH (ref 70–99)
Potassium: 3.8 mmol/L (ref 3.5–5.1)
Sodium: 134 mmol/L — ABNORMAL LOW (ref 135–145)
Total Bilirubin: 0.7 mg/dL (ref 0.3–1.2)
Total Protein: 7.2 g/dL (ref 6.5–8.1)

## 2021-03-05 MED ORDER — PEGFILGRASTIM 6 MG/0.6ML ~~LOC~~ PSKT
6.0000 mg | PREFILLED_SYRINGE | Freq: Once | SUBCUTANEOUS | Status: AC
  Administered 2021-03-05: 6 mg via SUBCUTANEOUS
  Filled 2021-03-05: qty 0.6

## 2021-03-05 MED ORDER — HEPARIN SOD (PORK) LOCK FLUSH 100 UNIT/ML IV SOLN
500.0000 [IU] | Freq: Once | INTRAVENOUS | Status: AC
  Administered 2021-03-05: 500 [IU] via INTRAVENOUS
  Filled 2021-03-05: qty 5

## 2021-03-05 MED ORDER — SODIUM CHLORIDE 0.9% FLUSH
10.0000 mL | INTRAVENOUS | Status: DC | PRN
  Administered 2021-03-05: 10 mL via INTRAVENOUS
  Filled 2021-03-05: qty 10

## 2021-03-05 MED ORDER — HEPARIN SOD (PORK) LOCK FLUSH 100 UNIT/ML IV SOLN
INTRAVENOUS | Status: AC
  Filled 2021-03-05: qty 5

## 2021-03-05 MED ORDER — SODIUM CHLORIDE 0.9 % IV SOLN
375.0000 mg/m2 | Freq: Once | INTRAVENOUS | Status: AC
  Administered 2021-03-05: 750 mg via INTRAVENOUS
  Filled 2021-03-05: qty 20

## 2021-03-05 MED ORDER — SODIUM CHLORIDE 0.9 % IV SOLN
Freq: Once | INTRAVENOUS | Status: AC
  Filled 2021-03-05: qty 250

## 2021-03-05 MED ORDER — SODIUM CHLORIDE 0.9 % IV SOLN
10.0000 mg | Freq: Once | INTRAVENOUS | Status: AC
  Administered 2021-03-05: 10 mg via INTRAVENOUS
  Filled 2021-03-05: qty 10

## 2021-03-05 NOTE — Progress Notes (Signed)
ANC: 1300. MD, Dr. Janese Banks, notified and already aware. Per MD order: proceed with scheduled [name] treatment today.

## 2021-03-05 NOTE — Progress Notes (Signed)
Pt doing well on treatment, feels like his left side is back to normal. He can do push ups now

## 2021-03-05 NOTE — Progress Notes (Signed)
Hematology/Oncology Consult note [facility]  Telephone:([phone] [phone]  Patient Care Team: Default, Provider, MD as PCP - General Telford Nab, RN as Registered Nurse Sindy Guadeloupe, MD as Consulting Physician (Hematology and Oncology) Sindy Guadeloupe, MD as Consulting Physician (Hematology and Oncology)   Name of the patient: Kyle Guerrero  [id number]  1971-05-20   Date of visit: 03/05/21  Diagnosis- Non-small cell lung cancer stage IV acT2 cN2 cM1 a with pleural involvement  Chief complaint/ Reason for visit-on treatment assessment prior to cycle 6 of maintenance [name]  Heme/Onc history: patient is a 50 year old male who presented to the ER with symptoms ofheaviness in his chest and upper left chest discomfort he underwent CT angio chest to rule out PE which showed 3.8 x 3.3 cm left upper palpable lung mass along with 4.4 x 3.3 cm lobulated mass in the aortopulmonary window and 2.6 x 2.4 cm left hilar mass all concerning for malignancy. Patient has also seen pulmonary and has been set up for bronchoscopy and EBUS guided biopsy on 11/23/2019. Patient underwent. PET CT scan which showed a hypermetabolic spiculated 3.5 cm of 5 left upper lobe lung mass, adjacent hypermetabolic 3.2 x 1.2 cm pleural metastases in the medial posterior by the left pleural space along with scalloping of the adjacent posterior left third rib. Hypermetabolic infiltrative left perihilar conglomerate nodal metastases measuring up to 7.3 x 3.6 cm and 0.8 cm high left mediastinal node between the left brachiocephalic vein and left subclavian artery. No evidence of distant metastatic disease  Biopsy showed non-small cell lung cancer but further characterization could not be determined. Insufficient tissue for NGS testing. Repeat biopsy done. Results of NGStestingshowed PD-L1 50%. Tumor mutational burden high. 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  Patient completed concurrent chemoradiation with carbotaxol chemotherapy followed by 2 cycles of carbotaxol Keytruda andwason maintenance Keytruda  Patient was complaining of left shoulder pain and underwent MRI of the shoulder which showed8 in the supraspinatus tendon. He was subsequently seen by [facility] and underwent MRI of the cervical spine which showed a 4.6 x 4.8 x 8.2 cm mass in the left prevertebral region from C2-C7 levels. The mass partially encases the left vertebral artery and abuts the preforaminal segment. Invades the vertebral bodies and edema of the left C5 articular pillar. There is slight extension into the left C4-C5 thecal sac without central thecal sac compression. The mass displaces the left pharynx anteriorly along the left carotid sheath.Patient had a repeat biopsy which was consistent with adenocarcinoma.  Patient underwent palliative radiation treatment to his neck mass and completed 4 cycles of carboplatin and [name] with excellent response to treatment and is currently on maintenance [name]. Repeat NGS testing was also performed which did not show any evidence of actionable mutations  Interval history-he is doing well and denies any complaints at this time.  He continues to workout at the gym and improve his left upper extremity strength.  Denies any pain  ECOG PS- 0 Pain scale- 0   Review of systems- Review of Systems  Constitutional: Negative for chills, fever, malaise/fatigue and weight loss.  HENT: Negative for congestion, ear discharge and nosebleeds.   Eyes: Negative for blurred vision.  Respiratory: Negative for cough, hemoptysis, sputum production, shortness of breath and wheezing.   Cardiovascular: Negative for chest pain, palpitations, orthopnea and claudication.  Gastrointestinal: Negative for abdominal pain, blood in stool, constipation, diarrhea, heartburn, melena, nausea and vomiting.  Genitourinary:  Negative for dysuria, flank pain, frequency, hematuria and urgency.  Musculoskeletal: Negative for back pain, joint pain and myalgias.  Skin: Negative for rash.  Neurological: Negative for dizziness, tingling, focal weakness, seizures, weakness and headaches.  Endo/Heme/Allergies: Does not bruise/bleed easily.  Psychiatric/Behavioral: Negative for depression and suicidal ideas. The patient does not have insomnia.       No Known Allergies   Past Medical History:  Diagnosis Date  . Asthma   . Lung cancer [facility])      Past Surgical History:  Procedure Laterality Date  . CYST EXCISION    . IR IMAGING GUIDED PORT INSERTION  12/05/2019  . VIDEO BRONCHOSCOPY WITH ENDOBRONCHIAL ULTRASOUND Left 11/22/2019   Procedure: VIDEO BRONCHOSCOPY WITH ENDOBRONCHIAL ULTRASOUND;  Surgeon: Tyler Pita, MD;  Location: [facility] ORS;  Service: Thoracic;  Laterality: Left;    Social History   Socioeconomic History  . Marital status: Single    Spouse name: Not on file  . Number of children: Not on file  . Years of education: Not on file  . Highest education level: Not on file  Occupational History  . Not on file  Tobacco Use  . Smoking status: Former Smoker    Packs/day: 2.00    Types: Cigarettes    Quit date: 11/08/2019    Years since quitting: 1.3  . Smokeless tobacco: Never Used  Vaping Use  . Vaping Use: Never used  Substance and Sexual Activity  . Alcohol use: Not Currently  . Drug use: Yes    Types: Marijuana  . Sexual activity: Not on file  Other Topics Concern  . Not on file  Social History Narrative  . Not on file   Social Determinants of Health   Financial Resource Strain: Not on file  Food Insecurity: Not on file  Transportation Needs: Not on file  Physical Activity: Not on file  Stress: Not on file  Social Connections: Not on file  Intimate Partner Violence: Not on file    Family History  Problem Relation Age of Onset  . Healthy Mother   . Healthy Father       Current Outpatient Medications:  .  folic acid (FOLVITE) 1 MG tablet, Take 1 tablet (1 mg total) by mouth daily. Start 5-7 days before [name] chemotherapy. Continue until 21 days after [name] completed., Disp: 100 tablet, Rfl: 3 .  ondansetron (ZOFRAN) 8 MG tablet, Take 1 tablet (8 mg total) by mouth 2 (two) times daily as needed (Nausea or vomiting). Start if needed on the third day after chemotherapy. (Patient not taking: No sig reported), Disp: 30 tablet, Rfl: 1 No current facility-administered medications for this visit.  Facility-Administered Medications Ordered in Other Visits:  .  heparin lock flush 100 unit/mL, 500 Units, Intravenous, Once, Sindy Guadeloupe, MD .  heparin lock flush 100 unit/mL, 500 Units, Intravenous, Once, Sindy Guadeloupe, MD .  sodium chloride flush (NS) 0.9 % injection 10 mL, 10 mL, Intravenous, PRN, Sindy Guadeloupe, MD, 10 mL at 05/01/20 0900 .  sodium chloride flush (NS) 0.9 % injection 10 mL, 10 mL, Intravenous, PRN, Sindy Guadeloupe, MD, 10 mL at 01/29/21 0835 .  sodium chloride flush (NS) 0.9 % injection 10 mL, 10 mL, Intravenous, PRN, Sindy Guadeloupe, MD, 10 mL at 03/05/21 0806  Physical exam:  Vitals:   03/05/21 0837  BP: (!) 117/93  Pulse: 85  Resp: 16  Temp: 98.3 F (36.8 C)  TempSrc: Oral  Weight: 180 lb 11.2  oz (82 kg)  Height: 5' 11" (1.803 m)   Physical Exam Cardiovascular:     Rate and Rhythm: Normal rate and regular rhythm.     Heart sounds: Normal heart sounds.  Pulmonary:     Effort: Pulmonary effort is normal.  Skin:    General: Skin is warm and dry.  Neurological:     Mental Status: He is alert and oriented to person, place, and time.      CMP Latest Ref Rng & Units 02/12/2021  Glucose 70 - 99 mg/dL 81  BUN 6 - 20 mg/dL 11  Creatinine 0.61 - 1.24 mg/dL 1.09  Sodium 135 - 145 mmol/L 138  Potassium 3.5 - 5.1 mmol/L 3.9  Chloride 98 - 111 mmol/L 101  CO2 22 - 32 mmol/L 26  Calcium 8.9 - 10.3 mg/dL 8.9  Total Protein 6.5 - 8.1  g/dL 7.4  Total Bilirubin 0.3 - 1.2 mg/dL 0.8  Alkaline Phos 38 - 126 U/L 100  AST 15 - 41 U/L 25  ALT 0 - 44 U/L 21   CBC Latest Ref Rng & Units 03/05/2021  WBC 4.0 - 10.5 K/uL 3.5(L)  Hemoglobin 13.0 - 17.0 g/dL 12.1(L)  Hematocrit 39.0 - 52.0 % 36.1(L)  Platelets 150 - 400 K/uL 361    No images are attached to the encounter.  CT SOFT TISSUE NECK W CONTRAST  Result Date: 02/11/2021 CLINICAL DATA:  Metastatic lung cancer surveillance EXAM: CT NECK WITH CONTRAST TECHNIQUE: Multidetector CT imaging of the neck was performed using the standard protocol following the bolus administration of intravenous contrast. CONTRAST:  169m OMNIPAQUE IOHEXOL 300 MG/ML  SOLN COMPARISON:  11/10/2020 FINDINGS: PHARYNX AND LARYNX: The nasopharynx, oropharynx and larynx are normal. Visible portions of the oral cavity, tongue base and floor of mouth are normal. Normal epiglottis, vallecula and pyriform sinuses. Slight medialization of the left vocal fold. No retropharyngeal abscess, effusion or lymphadenopathy. SALIVARY GLANDS: Mild hyperdensity compatible with prior radiation treatment. THYROID: Normal. LYMPH NODES: Decreased size of left supraclavicular nodes now measuring 7 mm, previously 8 mm. No new lymphadenopathy. VASCULAR: Major cervical vessels are patent. LIMITED INTRACRANIAL: Normal. VISUALIZED ORBITS: Normal. MASTOIDS AND VISUALIZED PARANASAL SINUSES: No fluid levels or advanced mucosal thickening. No mastoid effusion. SKELETON: Unchanged lytic appearance within the left mid cervical spine.Associated soft tissue mass is difficult to distinguish from surrounding muscles but appears smaller. UPPER CHEST: Concomitant chest CT reported separately. OTHER: None. IMPRESSION: 1. Decreased size of left supraclavicular lymph nodes. No new lymphadenopathy. 2. Unchanged lytic appearance within the left mid cervical spine. Associated soft tissue mass is difficult to distinguish from surrounding muscles but appears  smaller. Electronically Signed   By: KUlyses JarredM.D.   On: 02/11/2021 20:51   CT CHEST ABDOMEN PELVIS W CONTRAST  Result Date: 02/11/2021 CLINICAL DATA:  Restaging metastatic lung cancer EXAM: CT CHEST, ABDOMEN, AND PELVIS WITH CONTRAST TECHNIQUE: Multidetector CT imaging of the chest, abdomen and pelvis was performed following the standard protocol during bolus administration of intravenous contrast. CONTRAST:  1056mOMNIPAQUE IOHEXOL 300 MG/ML  SOLN COMPARISON:  11/10/2020 FINDINGS: CT CHEST FINDINGS Cardiovascular: The heart size appears within normal limits. There is no pericardial effusion identified. Mediastinum/Nodes: Normal appearance of the thyroid gland. The trachea appears patent and is midline. Normal appearance of the esophagus. No enlarged axillary, supraclavicular, mediastinal, or hilar adenopathy. Lungs/Pleura: No pleural effusion. The round solid nodule within the left apex measures 1.6 x 1.2 x 2.1 cm (volume = 2.1 cm^3), image 13/5. previously this  measured 1.4 by 1.0 x 2.6 cm (volume = 1.9 cm^3). Increase in surrounding consolidation, architectural distortion and fibrosis. Musculoskeletal: No chest wall mass or suspicious bone lesions identified. CT ABDOMEN PELVIS FINDINGS Hepatobiliary: No focal liver abnormality is seen. No gallstones, gallbladder wall thickening, or biliary dilatation. Pancreas: Unremarkable. No pancreatic ductal dilatation or surrounding inflammatory changes. Spleen: Normal in size without focal abnormality. Adrenals/Urinary Tract: Normal appearance of the adrenal glands. No kidney mass or hydronephrosis. Urinary bladder is unremarkable. Stomach/Bowel: Stomach is within normal limits. Appendix appears normal. No evidence of bowel wall thickening, distention, or inflammatory changes. Vascular/Lymphatic: No significant vascular findings are present. No enlarged abdominal or pelvic lymph nodes. Reproductive: Prostate is unremarkable. Other: No free fluid or fluid  collections identified within the abdomen or pelvis. Musculoskeletal: No acute or significant osseous findings. IMPRESSION: 1. No significant change in size of left upper lobe pulmonary nodule with progressive changes of external beam radiation in the surrounding the left lung. 2. No findings of metastatic disease within the abdomen or pelvis. Electronically Signed   By: Kerby Moors M.D.   On: 02/11/2021 19:36     Assessment and plan- Patient is a 50 y.o. male  with metastatic adenocarcinoma of the lung with pleural metastases then soft tissue involvement of the neck as well as cervical vertebrae.  He is here for on treatment assessment prior to cycle 5 of maintenance [name]  Patient has baseline chemo induced neutropenia and his white count today is 3.5 with an [place] of 1.3.He can therefore proceed with maintenance [name] cycle 5 today with on pro-Neulasta.  He is receiving [name] at a reduced dose of 375 mg per metered square.  I will see him back in 3 weeks for cycle 6.  He will need repeat imaging in May 2022.  Overall he is tolerating treatment well without any other significant side effects.He will be receiving B12 with cycle 7.   Visit Diagnosis 1. Encounter for antineoplastic chemotherapy   2. Malignant neoplasm of upper lobe of left lung ([place])   3. Chemotherapy induced neutropenia (HCC)      Dr. Randa Evens, MD, MPH [facility] at [facility] [phone] 03/05/2021 8:55 AM

## 2021-03-22 ENCOUNTER — Other Ambulatory Visit: Payer: Self-pay | Admitting: *Deleted

## 2021-03-22 DIAGNOSIS — T451X5A Adverse effect of antineoplastic and immunosuppressive drugs, initial encounter: Secondary | ICD-10-CM

## 2021-03-22 DIAGNOSIS — C3412 Malignant neoplasm of upper lobe, left bronchus or lung: Secondary | ICD-10-CM

## 2021-03-22 DIAGNOSIS — D701 Agranulocytosis secondary to cancer chemotherapy: Secondary | ICD-10-CM

## 2021-03-26 ENCOUNTER — Inpatient Hospital Stay: Payer: Medicaid Other

## 2021-03-26 ENCOUNTER — Inpatient Hospital Stay: Payer: Medicaid Other | Admitting: Oncology

## 2021-04-02 ENCOUNTER — Inpatient Hospital Stay: Payer: Medicaid Other | Attending: Oncology

## 2021-04-02 ENCOUNTER — Inpatient Hospital Stay (HOSPITAL_BASED_OUTPATIENT_CLINIC_OR_DEPARTMENT_OTHER): Payer: Medicaid Other | Admitting: Oncology

## 2021-04-02 ENCOUNTER — Encounter: Payer: Self-pay | Admitting: Oncology

## 2021-04-02 ENCOUNTER — Inpatient Hospital Stay: Payer: Medicaid Other

## 2021-04-02 ENCOUNTER — Other Ambulatory Visit: Payer: Self-pay

## 2021-04-02 VITALS — HR 89 | Temp 98.4°F | Resp 16 | Ht 71.0 in | Wt 179.0 lb

## 2021-04-02 DIAGNOSIS — N179 Acute kidney failure, unspecified: Secondary | ICD-10-CM

## 2021-04-02 DIAGNOSIS — T451X5A Adverse effect of antineoplastic and immunosuppressive drugs, initial encounter: Secondary | ICD-10-CM

## 2021-04-02 DIAGNOSIS — C3412 Malignant neoplasm of upper lobe, left bronchus or lung: Secondary | ICD-10-CM | POA: Diagnosis not present

## 2021-04-02 DIAGNOSIS — D701 Agranulocytosis secondary to cancer chemotherapy: Secondary | ICD-10-CM

## 2021-04-02 DIAGNOSIS — C782 Secondary malignant neoplasm of pleura: Secondary | ICD-10-CM | POA: Diagnosis not present

## 2021-04-02 DIAGNOSIS — M25512 Pain in left shoulder: Secondary | ICD-10-CM | POA: Insufficient documentation

## 2021-04-02 DIAGNOSIS — Z5111 Encounter for antineoplastic chemotherapy: Secondary | ICD-10-CM

## 2021-04-02 DIAGNOSIS — Z87891 Personal history of nicotine dependence: Secondary | ICD-10-CM | POA: Insufficient documentation

## 2021-04-02 DIAGNOSIS — Z5189 Encounter for other specified aftercare: Secondary | ICD-10-CM | POA: Insufficient documentation

## 2021-04-02 DIAGNOSIS — C7951 Secondary malignant neoplasm of bone: Secondary | ICD-10-CM | POA: Diagnosis not present

## 2021-04-02 LAB — CBC WITH DIFFERENTIAL/PLATELET
Abs Immature Granulocytes: 0.01 10*3/uL (ref 0.00–0.07)
Basophils Absolute: 0 10*3/uL (ref 0.0–0.1)
Basophils Relative: 1 %
Eosinophils Absolute: 0.3 10*3/uL (ref 0.0–0.5)
Eosinophils Relative: 7 %
HCT: 36.1 % — ABNORMAL LOW (ref 39.0–52.0)
Hemoglobin: 12.1 g/dL — ABNORMAL LOW (ref 13.0–17.0)
Immature Granulocytes: 0 %
Lymphocytes Relative: 32 %
Lymphs Abs: 1.4 10*3/uL (ref 0.7–4.0)
MCH: 32 pg (ref 26.0–34.0)
MCHC: 33.5 g/dL (ref 30.0–36.0)
MCV: 95.5 fL (ref 80.0–100.0)
Monocytes Absolute: 0.8 10*3/uL (ref 0.1–1.0)
Monocytes Relative: 18 %
Neutro Abs: 1.8 10*3/uL (ref 1.7–7.7)
Neutrophils Relative %: 42 %
Platelets: 348 10*3/uL (ref 150–400)
RBC: 3.78 MIL/uL — ABNORMAL LOW (ref 4.22–5.81)
RDW: 14.4 % (ref 11.5–15.5)
WBC: 4.3 10*3/uL (ref 4.0–10.5)
nRBC: 0 % (ref 0.0–0.2)

## 2021-04-02 LAB — COMPREHENSIVE METABOLIC PANEL
ALT: 17 U/L (ref 0–44)
AST: 22 U/L (ref 15–41)
Albumin: 3.8 g/dL (ref 3.5–5.0)
Alkaline Phosphatase: 96 U/L (ref 38–126)
Anion gap: 10 (ref 5–15)
BUN: 24 mg/dL — ABNORMAL HIGH (ref 6–20)
CO2: 25 mmol/L (ref 22–32)
Calcium: 8.9 mg/dL (ref 8.9–10.3)
Chloride: 100 mmol/L (ref 98–111)
Creatinine, Ser: 1.38 mg/dL — ABNORMAL HIGH (ref 0.61–1.24)
GFR, Estimated: >60 mL/min (ref >60)
Glucose, Bld: 124 mg/dL — ABNORMAL HIGH (ref 70–99)
Potassium: 4 mmol/L (ref 3.5–5.1)
Sodium: 135 mmol/L (ref 135–145)
Total Bilirubin: 0.6 mg/dL (ref 0.3–1.2)
Total Protein: 7.6 g/dL (ref 6.5–8.1)

## 2021-04-02 LAB — TSH: TSH: 0.98 u[IU]/mL (ref 0.350–4.500)

## 2021-04-02 MED ORDER — HEPARIN SOD (PORK) LOCK FLUSH 100 UNIT/ML IV SOLN
500.0000 [IU] | Freq: Once | INTRAVENOUS | Status: AC | PRN
  Administered 2021-04-02: 500 [IU]
  Filled 2021-04-02: qty 5

## 2021-04-02 MED ORDER — PEMETREXED DISODIUM CHEMO INJECTION 500 MG
375.0000 mg/m2 | Freq: Once | INTRAVENOUS | Status: AC
  Administered 2021-04-02: 750 mg via INTRAVENOUS
  Filled 2021-04-02: qty 20

## 2021-04-02 MED ORDER — SODIUM CHLORIDE 0.9 % IV SOLN
Freq: Once | INTRAVENOUS | Status: AC
Start: 2021-04-02 — End: 2021-04-02
  Filled 2021-04-02: qty 250

## 2021-04-02 MED ORDER — HEPARIN SOD (PORK) LOCK FLUSH 100 UNIT/ML IV SOLN
INTRAVENOUS | Status: AC
  Filled 2021-04-02: qty 5

## 2021-04-02 MED ORDER — SODIUM CHLORIDE 0.9 % IV SOLN
10.0000 mg | Freq: Once | INTRAVENOUS | Status: AC
  Administered 2021-04-02: 10 mg via INTRAVENOUS
  Filled 2021-04-02: qty 10

## 2021-04-02 MED ORDER — PEGFILGRASTIM 6 MG/0.6ML ~~LOC~~ PSKT
6.0000 mg | PREFILLED_SYRINGE | Freq: Once | SUBCUTANEOUS | Status: AC
Start: 2021-04-02 — End: 2021-04-02
  Administered 2021-04-02: 6 mg via SUBCUTANEOUS
  Filled 2021-04-02: qty 0.6

## 2021-04-02 NOTE — Progress Notes (Signed)
Hematology/Oncology Consult note [facility]  [phone] Fax:([phone]  Patient Care Team: Default, Provider, MD as PCP - General Telford Nab, RN as Registered Nurse Sindy Guadeloupe, MD as Consulting Physician (Hematology and Oncology) Sindy Guadeloupe, MD as Consulting Physician (Hematology and Oncology)   Name of the patient: Kyle Guerrero  [id number]  04-22-1971   Date of visit: 04/02/21  Diagnosis- Non-small cell lung cancer stage IV acT2 cN2 cM1 a with pleural involvement  Chief complaint/ Reason for visit-on treatment assessment prior to cycle 7 of maintenance [name]  Heme/Onc history: patient is a 50 year old male who presented to the ER with symptoms ofheaviness in his chest and upper left chest discomfort he underwent CT angio chest to rule out PE which showed 3.8 x 3.3 cm left upper palpable lung mass along with 4.4 x 3.3 cm lobulated mass in the aortopulmonary window and 2.6 x 2.4 cm left hilar mass all concerning for malignancy. Patient has also seen pulmonary and has been set up for bronchoscopy and EBUS guided biopsy on 11/23/2019. Patient underwent. PET CT scan which showed a hypermetabolic spiculated 3.5 cm of 5 left upper lobe lung mass, adjacent hypermetabolic 3.2 x 1.2 cm pleural metastases in the medial posterior by the left pleural space along with scalloping of the adjacent posterior left third rib. Hypermetabolic infiltrative left perihilar conglomerate nodal metastases measuring up to 7.3 x 3.6 cm and 0.8 cm high left mediastinal node between the left brachiocephalic vein and left subclavian artery. No evidence of distant metastatic disease  Biopsy showed non-small cell lung cancer but further characterization could not be determined. Insufficient tissue for NGS testing. Repeat biopsy done. Results of NGStestingshowed PD-L1 50%. Tumor mutational burden high. ERBB2 copy number again.NGS testing on peripheral blood  showed NTRK mutation.  Patient completed concurrent chemoradiation with carbotaxol chemotherapy followed by 2 cycles of carbotaxol Keytruda andwason maintenance Keytruda  Patient was complaining of left shoulder pain and underwent MRI of the shoulder which showed8 in the supraspinatus tendon. He was subsequently seen by [facility] and underwent MRI of the cervical spine which showed a 4.6 x 4.8 x 8.2 cm mass in the left prevertebral region from C2-C7 levels. The mass partially encases the left vertebral artery and abuts the preforaminal segment. Invades the vertebral bodies and edema of the left C5 articular pillar. There is slight extension into the left C4-C5 thecal sac without central thecal sac compression. The mass displaces the left pharynx anteriorly along the left carotid sheath.Patient had a repeat biopsy which was consistent with adenocarcinoma.  Patient underwent palliative radiation treatment to his neck mass and completed 4 cycles of carboplatin and [name] with excellent response to treatment and is currently on maintenance [name]. Repeat NGS testing was also performed which did not show any evidence of actionable mutations   Interval history-patient reports mild left shoulder pain but does not report any limitation in movements.  He continues to workout and exercise regularly.  Denies other complaints  ECOG PS- 0 Pain scale- 0  Review of systems- Review of Systems  Constitutional: Negative for chills, fever, malaise/fatigue and weight loss.  HENT: Negative for congestion, ear discharge and nosebleeds.   Eyes: Negative for blurred vision.  Respiratory: Negative for cough, hemoptysis, sputum production, shortness of breath and wheezing.   Cardiovascular: Negative for chest pain, palpitations, orthopnea and claudication.  Gastrointestinal: Negative for abdominal pain, blood in stool, constipation, diarrhea, heartburn, melena, nausea and vomiting.  Genitourinary:  Negative for  dysuria, flank pain, frequency, hematuria and urgency.  Musculoskeletal: Positive for joint pain (Left shoulder pain). Negative for back pain and myalgias.  Skin: Negative for rash.  Neurological: Negative for dizziness, tingling, focal weakness, seizures, weakness and headaches.  Endo/Heme/Allergies: Does not bruise/bleed easily.  Psychiatric/Behavioral: Negative for depression and suicidal ideas. The patient does not have insomnia.        No Known Allergies   Past Medical History:  Diagnosis Date  . Asthma   . Lung cancer [facility])      Past Surgical History:  Procedure Laterality Date  . CYST EXCISION    . IR IMAGING GUIDED PORT INSERTION  12/05/2019  . VIDEO BRONCHOSCOPY WITH ENDOBRONCHIAL ULTRASOUND Left 11/22/2019   Procedure: VIDEO BRONCHOSCOPY WITH ENDOBRONCHIAL ULTRASOUND;  Surgeon: Tyler Pita, MD;  Location: [facility] ORS;  Service: Thoracic;  Laterality: Left;    Social History   Socioeconomic History  . Marital status: Single    Spouse name: Not on file  . Number of children: Not on file  . Years of education: Not on file  . Highest education level: Not on file  Occupational History  . Not on file  Tobacco Use  . Smoking status: Former Smoker    Packs/day: 2.00    Types: Cigarettes    Quit date: 11/08/2019    Years since quitting: 1.4  . Smokeless tobacco: Never Used  Vaping Use  . Vaping Use: Never used  Substance and Sexual Activity  . Alcohol use: Not Currently  . Drug use: Yes    Types: Marijuana  . Sexual activity: Not on file  Other Topics Concern  . Not on file  Social History Narrative  . Not on file   Social Determinants of Health   Financial Resource Strain: Not on file  Food Insecurity: Not on file  Transportation Needs: Not on file  Physical Activity: Not on file  Stress: Not on file  Social Connections: Not on file  Intimate Partner Violence: Not on file    Family History  Problem Relation Age of Onset  .  Healthy Mother   . Healthy Father      Current Outpatient Medications:  .  folic acid (FOLVITE) 1 MG tablet, Take 1 tablet (1 mg total) by mouth daily. Start 5-7 days before [name] chemotherapy. Continue until 21 days after [name] completed., Disp: 100 tablet, Rfl: 3 .  ondansetron (ZOFRAN) 8 MG tablet, Take 1 tablet (8 mg total) by mouth 2 (two) times daily as needed (Nausea or vomiting). Start if needed on the third day after chemotherapy., Disp: 30 tablet, Rfl: 1 No current facility-administered medications for this visit.  Facility-Administered Medications Ordered in Other Visits:  .  heparin lock flush 100 unit/mL, 500 Units, Intravenous, Once, Randa Evens C, MD .  sodium chloride flush (NS) 0.9 % injection 10 mL, 10 mL, Intravenous, PRN, Sindy Guadeloupe, MD, 10 mL at 05/01/20 0900 .  sodium chloride flush (NS) 0.9 % injection 10 mL, 10 mL, Intravenous, PRN, Sindy Guadeloupe, MD, 10 mL at 01/29/21 0835  Physical exam:  Vitals:   04/02/21 0922  Pulse: 89  Resp: 16  Temp: 98.4 F (36.9 C)  TempSrc: Oral  Weight: 179 lb (81.2 kg)  Height: 5' 11"  (1.803 m)   Physical Exam Constitutional:      General: He is not in acute distress. Cardiovascular:     Rate and Rhythm: Normal rate and regular rhythm.     Heart sounds: Normal heart sounds.  Pulmonary:     Effort: Pulmonary effort is normal.     Breath sounds: Normal breath sounds.  Abdominal:     General: Bowel sounds are normal.     Palpations: Abdomen is soft.  Musculoskeletal:        General: No deformity.  Skin:    General: Skin is warm and dry.  Neurological:     Mental Status: He is alert and oriented to person, place, and time.      CMP Latest Ref Rng & Units 04/02/2021  Glucose 70 - 99 mg/dL 124(H)  BUN 6 - 20 mg/dL 24(H)  Creatinine 0.61 - 1.24 mg/dL 1.38(H)  Sodium 135 - 145 mmol/L 135  Potassium 3.5 - 5.1 mmol/L 4.0  Chloride 98 - 111 mmol/L 100  CO2 22 - 32 mmol/L 25  Calcium 8.9 - 10.3 mg/dL 8.9  Total  Protein 6.5 - 8.1 g/dL 7.6  Total Bilirubin 0.3 - 1.2 mg/dL 0.6  Alkaline Phos 38 - 126 U/L 96  AST 15 - 41 U/L 22  ALT 0 - 44 U/L 17   CBC Latest Ref Rng & Units 04/02/2021  WBC 4.0 - 10.5 K/uL 4.3  Hemoglobin 13.0 - 17.0 g/dL 12.1(L)  Hematocrit 39.0 - 52.0 % 36.1(L)  Platelets 150 - 400 K/uL 348     Assessment and plan- Patient is a 50 y.o. male with metastatic adenocarcinoma of the lung with pleural metastases then soft tissue involvement of the neck as well as cervical vertebrae.   Is here for on treatment assessment prior to cycle 6 of maintenance [name]  Counts okay to proceed with cycle 6 of maintenance [name] today.  He will also receive on pro-Neulasta support since he has baseline chemo-induced neutropenia.  I will see him back in 3 weeks for cycle 7.  We will also receive B12 at that time.  Patient is already receiving [name] at a reduced dose of 375 mg per metered squared due to his neutropenia  Left shoulder pain: Mild prominence of the left trapezius muscle.  No palpable mass.  No limitation in shoulder movements.  Continue to monitor  AKI: Patient has been advised to improve his fluid intake to 40 to 50 ounces daily.  He verbalized understanding   Visit Diagnosis 1. Encounter for antineoplastic chemotherapy   2. Malignant neoplasm of upper lobe of left lung ([place])   3. Chemotherapy induced neutropenia (HCC)   4. AKI (acute kidney injury) ([place])      Dr. Randa Evens, MD, MPH [facility] at [facility] [phone] 04/02/2021 9:40 AM

## 2021-04-23 ENCOUNTER — Inpatient Hospital Stay: Payer: Medicaid Other

## 2021-04-23 ENCOUNTER — Other Ambulatory Visit: Payer: Self-pay

## 2021-04-23 ENCOUNTER — Inpatient Hospital Stay (HOSPITAL_BASED_OUTPATIENT_CLINIC_OR_DEPARTMENT_OTHER): Payer: Medicaid Other | Admitting: Oncology

## 2021-04-23 ENCOUNTER — Encounter: Payer: Self-pay | Admitting: Oncology

## 2021-04-23 VITALS — BP 140/98 | HR 78 | Temp 97.0°F | Resp 16 | Wt 185.1 lb

## 2021-04-23 DIAGNOSIS — Z5111 Encounter for antineoplastic chemotherapy: Secondary | ICD-10-CM

## 2021-04-23 DIAGNOSIS — C3412 Malignant neoplasm of upper lobe, left bronchus or lung: Secondary | ICD-10-CM

## 2021-04-23 DIAGNOSIS — N179 Acute kidney failure, unspecified: Secondary | ICD-10-CM

## 2021-04-23 LAB — COMPREHENSIVE METABOLIC PANEL
ALT: 18 U/L (ref 0–44)
AST: 22 U/L (ref 15–41)
Albumin: 3.7 g/dL (ref 3.5–5.0)
Alkaline Phosphatase: 102 U/L (ref 38–126)
Anion gap: 9 (ref 5–15)
BUN: 13 mg/dL (ref 6–20)
CO2: 25 mmol/L (ref 22–32)
Calcium: 9 mg/dL (ref 8.9–10.3)
Chloride: 99 mmol/L (ref 98–111)
Creatinine, Ser: 1.25 mg/dL — ABNORMAL HIGH (ref 0.61–1.24)
GFR, Estimated: >60 mL/min (ref >60)
Glucose, Bld: 113 mg/dL — ABNORMAL HIGH (ref 70–99)
Potassium: 3.7 mmol/L (ref 3.5–5.1)
Sodium: 133 mmol/L — ABNORMAL LOW (ref 135–145)
Total Bilirubin: 0.8 mg/dL (ref 0.3–1.2)
Total Protein: 7.3 g/dL (ref 6.5–8.1)

## 2021-04-23 LAB — CBC WITH DIFFERENTIAL/PLATELET
Abs Immature Granulocytes: 0.01 10*3/uL (ref 0.00–0.07)
Basophils Absolute: 0 10*3/uL (ref 0.0–0.1)
Basophils Relative: 1 %
Eosinophils Absolute: 0.2 10*3/uL (ref 0.0–0.5)
Eosinophils Relative: 5 %
HCT: 35.5 % — ABNORMAL LOW (ref 39.0–52.0)
Hemoglobin: 11.9 g/dL — ABNORMAL LOW (ref 13.0–17.0)
Immature Granulocytes: 0 %
Lymphocytes Relative: 33 %
Lymphs Abs: 1.3 10*3/uL (ref 0.7–4.0)
MCH: 32.1 pg (ref 26.0–34.0)
MCHC: 33.5 g/dL (ref 30.0–36.0)
MCV: 95.7 fL (ref 80.0–100.0)
Monocytes Absolute: 0.8 10*3/uL (ref 0.1–1.0)
Monocytes Relative: 22 %
Neutro Abs: 1.5 10*3/uL — ABNORMAL LOW (ref 1.7–7.7)
Neutrophils Relative %: 39 %
Platelets: 391 10*3/uL (ref 150–400)
RBC: 3.71 MIL/uL — ABNORMAL LOW (ref 4.22–5.81)
RDW: 14.1 % (ref 11.5–15.5)
WBC: 3.8 10*3/uL — ABNORMAL LOW (ref 4.0–10.5)
nRBC: 0 % (ref 0.0–0.2)

## 2021-04-23 MED ORDER — CYANOCOBALAMIN 1000 MCG/ML IJ SOLN
1000.0000 ug | INTRAMUSCULAR | Status: DC
  Administered 2021-04-23: 1000 ug via INTRAMUSCULAR
  Filled 2021-04-23: qty 1

## 2021-04-23 MED ORDER — HEPARIN SOD (PORK) LOCK FLUSH 100 UNIT/ML IV SOLN
500.0000 [IU] | Freq: Once | INTRAVENOUS | Status: AC | PRN
  Administered 2021-04-23: 500 [IU]
  Filled 2021-04-23: qty 5

## 2021-04-23 MED ORDER — HEPARIN SOD (PORK) LOCK FLUSH 100 UNIT/ML IV SOLN
INTRAVENOUS | Status: AC
  Filled 2021-04-23: qty 5

## 2021-04-23 MED ORDER — SODIUM CHLORIDE 0.9 % IV SOLN
375.0000 mg/m2 | Freq: Once | INTRAVENOUS | Status: AC
  Administered 2021-04-23: 750 mg via INTRAVENOUS
  Filled 2021-04-23: qty 20

## 2021-04-23 MED ORDER — PEGFILGRASTIM 6 MG/0.6ML ~~LOC~~ PSKT
6.0000 mg | PREFILLED_SYRINGE | Freq: Once | SUBCUTANEOUS | Status: AC
Start: 2021-04-23 — End: 2021-04-23
  Administered 2021-04-23: 6 mg via SUBCUTANEOUS
  Filled 2021-04-23: qty 0.6

## 2021-04-23 MED ORDER — SODIUM CHLORIDE 0.9 % IV SOLN
10.0000 mg | Freq: Once | INTRAVENOUS | Status: AC
  Administered 2021-04-23: 10 mg via INTRAVENOUS
  Filled 2021-04-23: qty 10

## 2021-04-23 MED ORDER — SODIUM CHLORIDE 0.9 % IV SOLN
Freq: Once | INTRAVENOUS | Status: AC
  Filled 2021-04-23: qty 250

## 2021-04-23 NOTE — Patient Instructions (Signed)
[name]  Discharge Instructions: Thank you for choosing [facility] to provide your oncology and hematology care.  If you have a lab appointment with the [facility], please go directly to the [facility] and check in at the registration area.  Wear comfortable clothing and clothing appropriate for easy access to any Portacath or PICC line.   We strive to give you quality time with your provider. You may need to reschedule your appointment if you arrive late (15 or more minutes).  Arriving late affects you and other patients whose appointments are after yours.  Also, if you miss three or more appointments without notifying the office, you may be dismissed from the clinic at the provider's discretion.      For prescription refill requests, have your pharmacy contact our office and allow 72 hours for refills to be completed.    Today you received the following chemotherapy and/or immunotherapy agents [name]      To help prevent nausea and vomiting after your treatment, we encourage you to take your nausea medication as directed.  BELOW ARE SYMPTOMS THAT SHOULD BE REPORTED IMMEDIATELY: . *FEVER GREATER THAN 100.4 F (38 C) OR HIGHER . *CHILLS OR SWEATING . *NAUSEA AND VOMITING THAT IS NOT CONTROLLED WITH YOUR NAUSEA MEDICATION . *UNUSUAL SHORTNESS OF BREATH . *UNUSUAL BRUISING OR BLEEDING . *URINARY PROBLEMS (pain or burning when urinating, or frequent urination) . *BOWEL PROBLEMS (unusual diarrhea, constipation, pain near the anus) . TENDERNESS IN MOUTH AND THROAT WITH OR WITHOUT PRESENCE OF ULCERS (sore throat, sores in mouth, or a toothache) . UNUSUAL RASH, SWELLING OR PAIN  . UNUSUAL VAGINAL DISCHARGE OR ITCHING   Items with * indicate a potential emergency and should be followed up as soon as possible or go to the Emergency Department if any problems should occur.  Please show the CHEMOTHERAPY ALERT CARD or IMMUNOTHERAPY ALERT  CARD at check-in to the Emergency Department and triage nurse.  Should you have questions after your visit or need to cancel or reschedule your appointment, please contact [facility]  [phone] and follow the prompts.  Office hours are 8:00 a.m. to 4:30 p.m. Monday - Friday. Please note that voicemails left after 4:00 p.m. may not be returned until the following business day.  We are closed weekends and major holidays. You have access to a nurse at all times for urgent questions. Please call the main number to the clinic [phone] and follow the prompts.  For any non-urgent questions, you may also contact your provider using MyChart. We now offer e-Visits for anyone 24 and older to request care online for non-urgent symptoms. For details visit [email or web address].   Also download the MyChart app! Go to the app store, search "MyChart", open the app, select [facility], and log in with your MyChart username and password.  Due to Covid, a mask is required upon entering the hospital/clinic. If you do not have a mask, one will be given to you upon arrival. For doctor visits, patients may have 1 support person aged 61 or older with them. For treatment visits, patients cannot have anyone with them due to current Covid guidelines and our immunocompromised population. Pegfilgrastim injection What is this medicine? [name] is a long-acting granulocyte colony-stimulating factor that stimulates the growth of neutrophils, a type of white blood cell important in the body's fight against infection. It is used to reduce the incidence of fever and infection  in patients with certain types of cancer who are receiving chemotherapy that affects the bone marrow, and to increase survival after being exposed to high doses of radiation. This medicine may be used for other purposes; ask your health care provider or pharmacist if you have questions. COMMON  BRAND NAME(S): [name], [name] What should I tell my health care provider before I take this medicine? They need to know if you have any of these conditions:  kidney disease  latex allergy  ongoing radiation therapy  sickle cell disease  skin reactions to acrylic adhesives (On-Body Injector only)  an unusual or allergic reaction to pegfilgrastim, filgrastim, other medicines, foods, dyes, or preservatives  pregnant or trying to get pregnant  breast-feeding How should I use this medicine? This medicine is for injection under the skin. If you get this medicine at home, you will be taught how to prepare and give the pre-filled syringe or how to use the On-body Injector. Refer to the patient Instructions for Use for detailed instructions. Use exactly as directed. Tell your healthcare provider immediately if you suspect that the On-body Injector may not have performed as intended or if you suspect the use of the On-body Injector resulted in a missed or partial dose. It is important that you put your used needles and syringes in a special sharps container. Do not put them in a trash can. If you do not have a sharps container, call your pharmacist or healthcare provider to get one. Talk to your pediatrician regarding the use of this medicine in children. While this drug may be prescribed for selected conditions, precautions do apply. Overdosage: If you think you have taken too much of this medicine contact a poison control center or emergency room at once. NOTE: This medicine is only for you. Do not share this medicine with others. What if I miss a dose? It is important not to miss your dose. Call your doctor or health care professional if you miss your dose. If you miss a dose due to an On-body Injector failure or leakage, a new dose should be administered as soon as possible using a single prefilled syringe for manual use. What may interact with this  medicine? Interactions have not been studied. This list may not describe all possible interactions. Give your health care provider a list of all the medicines, herbs, non-prescription drugs, or dietary supplements you use. Also tell them if you smoke, drink alcohol, or use illegal drugs. Some items may interact with your medicine. What should I watch for while using this medicine? Your condition will be monitored carefully while you are receiving this medicine. You may need blood work done while you are taking this medicine. Talk to your health care provider about your risk of cancer. You may be more at risk for certain types of cancer if you take this medicine. If you are going to need a MRI, CT scan, or other procedure, tell your doctor that you are using this medicine (On-Body Injector only). What side effects may I notice from receiving this medicine? Side effects that you should report to your doctor or health care professional as soon as possible:  allergic reactions (skin rash, itching or hives, swelling of the face, lips, or tongue)  back pain  dizziness  fever  pain, redness, or irritation at site where injected  pinpoint red spots on the skin  red or dark-brown urine  shortness of breath or breathing problems  stomach or side pain, or  pain at the shoulder  swelling  tiredness  trouble passing urine or change in the amount of urine  unusual bruising or bleeding Side effects that usually do not require medical attention (report to your doctor or health care professional if they continue or are bothersome):  bone pain  muscle pain This list may not describe all possible side effects. Call your doctor for medical advice about side effects. You may report side effects to FDA at 1-800-FDA-1088. Where should I keep my medicine? Keep out of the reach of children. If you are using this medicine at home, you will be instructed on how to store it. Throw away any unused  medicine after the expiration date on the label. NOTE: This sheet is a summary. It may not cover all possible information. If you have questions about this medicine, talk to your doctor, pharmacist, or health care provider.  2021 Elsevier/Gold Standard (2020-01-03 13:20:51) Cyanocobalamin, Vitamin B12 injection What is this medicine? CYANOCOBALAMIN (sye an oh koe BAL a min) is a man made form of vitamin B12. Vitamin B12 is used in the growth of healthy blood cells, nerve cells, and proteins in the body. It also helps with the metabolism of fats and carbohydrates. This medicine is used to treat people who can not absorb vitamin B12. This medicine may be used for other purposes; ask your health care provider or pharmacist if you have questions. COMMON BRAND NAME(S): B-12 Compliance Kit, B-12 Injection Kit, Cyomin, LA-12, Nutri-Twelve, Physicians EZ Use B-12, Primabalt What should I tell my health care provider before I take this medicine? They need to know if you have any of these conditions:  kidney disease  Leber's disease  megaloblastic anemia  an unusual or allergic reaction to cyanocobalamin, cobalt, other medicines, foods, dyes, or preservatives  pregnant or trying to get pregnant  breast-feeding How should I use this medicine? This medicine is injected into a muscle or deeply under the skin. It is usually given by a health care professional in a clinic or doctor's office. However, your doctor may teach you how to inject yourself. Follow all instructions. Talk to your pediatrician regarding the use of this medicine in children. Special care may be needed. Overdosage: If you think you have taken too much of this medicine contact a poison control center or emergency room at once. NOTE: This medicine is only for you. Do not share this medicine with others. What if I miss a dose? If you are given your dose at a clinic or doctor's office, call to reschedule your appointment. If you give  your own injections and you miss a dose, take it as soon as you can. If it is almost time for your next dose, take only that dose. Do not take double or extra doses. What may interact with this medicine?  colchicine  heavy alcohol intake This list may not describe all possible interactions. Give your health care provider a list of all the medicines, herbs, non-prescription drugs, or dietary supplements you use. Also tell them if you smoke, drink alcohol, or use illegal drugs. Some items may interact with your medicine. What should I watch for while using this medicine? Visit your doctor or health care professional regularly. You may need blood work done while you are taking this medicine. You may need to follow a special diet. Talk to your doctor. Limit your alcohol intake and avoid smoking to get the best benefit. What side effects may I notice from receiving this medicine? Side effects  that you should report to your doctor or health care professional as soon as possible:  allergic reactions like skin rash, itching or hives, swelling of the face, lips, or tongue  blue tint to skin  chest tightness, pain  difficulty breathing, wheezing  dizziness  red, swollen painful area on the leg Side effects that usually do not require medical attention (report to your doctor or health care professional if they continue or are bothersome):  diarrhea  headache This list may not describe all possible side effects. Call your doctor for medical advice about side effects. You may report side effects to FDA at 1-800-FDA-1088. Where should I keep my medicine? Keep out of the reach of children. Store at room temperature between 15 and 30 degrees C (59 and 85 degrees F). Protect from light. Throw away any unused medicine after the expiration date. NOTE: This sheet is a summary. It may not cover all possible information. If you have questions about this medicine, talk to your doctor, pharmacist, or health  care provider.  2021 Elsevier/Gold Standard (2008-03-24 22:10:20) Pemetrexed injection What is this medicine? PEMETREXED (PEM e TREX ed) is a chemotherapy drug used to treat lung cancers like non-small cell lung cancer and mesothelioma. It may also be used to treat other cancers. This medicine may be used for other purposes; ask your health care provider or pharmacist if you have questions. COMMON BRAND NAME(S): [name] What should I tell my health care provider before I take this medicine? They need to know if you have any of these conditions:  infection (especially a virus infection such as chickenpox, cold sores, or herpes)  kidney disease  low blood counts, like low white cell, platelet, or red cell counts  lung or breathing disease, like asthma  radiation therapy  an unusual or allergic reaction to pemetrexed, other medicines, foods, dyes, or preservative  pregnant or trying to get pregnant  breast-feeding How should I use this medicine? This drug is given as an infusion into a vein. It is administered in a hospital or clinic by a specially trained health care professional. Talk to your pediatrician regarding the use of this medicine in children. Special care may be needed. Overdosage: If you think you have taken too much of this medicine contact a poison control center or emergency room at once. NOTE: This medicine is only for you. Do not share this medicine with others. What if I miss a dose? It is important not to miss your dose. Call your doctor or health care professional if you are unable to keep an appointment. What may interact with this medicine? This medicine may interact with the following medications:  Ibuprofen This list may not describe all possible interactions. Give your health care provider a list of all the medicines, herbs, non-prescription drugs, or dietary supplements you use. Also tell them if you smoke, drink alcohol, or use illegal drugs. Some items  may interact with your medicine. What should I watch for while using this medicine? Visit your doctor for checks on your progress. This drug may make you feel generally unwell. This is not uncommon, as chemotherapy can affect healthy cells as well as cancer cells. Report any side effects. Continue your course of treatment even though you feel ill unless your doctor tells you to stop. In some cases, you may be given additional medicines to help with side effects. Follow all directions for their use. Call your doctor or health care professional for advice if you get a fever,  chills or sore throat, or other symptoms of a cold or flu. Do not treat yourself. This drug decreases your body's ability to fight infections. Try to avoid being around people who are sick. This medicine may increase your risk to bruise or bleed. Call your doctor or health care professional if you notice any unusual bleeding. Be careful brushing and flossing your teeth or using a toothpick because you may get an infection or bleed more easily. If you have any dental work done, tell your dentist you are receiving this medicine. Avoid taking products that contain aspirin, acetaminophen, ibuprofen, naproxen, or ketoprofen unless instructed by your doctor. These medicines may hide a fever. Call your doctor or health care professional if you get diarrhea or mouth sores. Do not treat yourself. To protect your kidneys, drink water or other fluids as directed while you are taking this medicine. Do not become pregnant while taking this medicine or for 6 months after stopping it. Women should inform their doctor if they wish to become pregnant or think they might be pregnant. Men should not father a child while taking this medicine and for 3 months after stopping it. This may interfere with the ability to father a child. You should talk to your doctor or health care professional if you are concerned about your fertility. There is a potential for  serious side effects to an unborn child. Talk to your health care professional or pharmacist for more information. Do not breast-feed an infant while taking this medicine or for 1 week after stopping it. What side effects may I notice from receiving this medicine? Side effects that you should report to your doctor or health care professional as soon as possible:  allergic reactions like skin rash, itching or hives, swelling of the face, lips, or tongue  breathing problems  redness, blistering, peeling or loosening of the skin, including inside the mouth  signs and symptoms of bleeding such as bloody or black, tarry stools; red or dark-brown urine; spitting up blood or brown material that looks like coffee grounds; red spots on the skin; unusual bruising or bleeding from the eye, gums, or nose  signs and symptoms of infection like fever or chills; cough; sore throat; pain or trouble passing urine  signs and symptoms of kidney injury like trouble passing urine or change in the amount of urine  signs and symptoms of liver injury like dark yellow or brown urine; general ill feeling or flu-like symptoms; light-colored stools; loss of appetite; nausea; right upper belly pain; unusually weak or tired; yellowing of the eyes or skin Side effects that usually do not require medical attention (report to your doctor or health care professional if they continue or are bothersome):  constipation  mouth sores  nausea, vomiting  unusually weak or tired This list may not describe all possible side effects. Call your doctor for medical advice about side effects. You may report side effects to FDA at 1-800-FDA-1088. Where should I keep my medicine? This drug is given in a hospital or clinic and will not be stored at home. NOTE: This sheet is a summary. It may not cover all possible information. If you have questions about this medicine, talk to your doctor, pharmacist, or health care provider.  2021  Elsevier/Gold Standard (2018-01-31 16:11:33)

## 2021-04-23 NOTE — Progress Notes (Signed)
Hematology/Oncology Consult note [facility]  [phone] [phone]  Patient Care Team: Default, Provider, MD as PCP - General Telford Nab, RN as Registered Nurse Sindy Guadeloupe, MD as Consulting Physician (Hematology and Oncology) Sindy Guadeloupe, MD as Consulting Physician (Hematology and Oncology)   Name of the patient: Kyle Guerrero  [id number]  Jan 21, 1971   Date of visit: 04/23/21  Diagnosis- Non-small cell lung cancer stage IV acT2 cN2 cM1 a with pleural involvement  Chief complaint/ Reason for visit-on treatment assessment prior to cycle 8 of maintenance [name]  Heme/Onc history: patient is a 50 year old male who presented to the ER with symptoms ofheaviness in his chest and upper left chest discomfort he underwent CT angio chest to rule out PE which showed 3.8 x 3.3 cm left upper palpable lung mass along with 4.4 x 3.3 cm lobulated mass in the aortopulmonary window and 2.6 x 2.4 cm left hilar mass all concerning for malignancy. Patient has also seen pulmonary and has been set up for bronchoscopy and EBUS guided biopsy on 11/23/2019. Patient underwent. PET CT scan which showed a hypermetabolic spiculated 3.5 cm of 5 left upper lobe lung mass, adjacent hypermetabolic 3.2 x 1.2 cm pleural metastases in the medial posterior by the left pleural space along with scalloping of the adjacent posterior left third rib. Hypermetabolic infiltrative left perihilar conglomerate nodal metastases measuring up to 7.3 x 3.6 cm and 0.8 cm high left mediastinal node between the left brachiocephalic vein and left subclavian artery. No evidence of distant metastatic disease  Biopsy showed non-small cell lung cancer but further characterization could not be determined. Insufficient tissue for NGS testing. Repeat biopsy done. Results of NGStestingshowed PD-L1 50%. Tumor mutational burden high. ERBB2 copy number again.NGS testing on peripheral blood  showed NTRK mutation.  Patient completed concurrent chemoradiation with carbotaxol chemotherapy followed by 2 cycles of carbotaxol Keytruda andwason maintenance Keytruda  Patient was complaining of left shoulder pain and underwent MRI of the shoulder which showed8 in the supraspinatus tendon. He was subsequently seen by [facility] and underwent MRI of the cervical spine which showed a 4.6 x 4.8 x 8.2 cm mass in the left prevertebral region from C2-C7 levels. The mass partially encases the left vertebral artery and abuts the preforaminal segment. Invades the vertebral bodies and edema of the left C5 articular pillar. There is slight extension into the left C4-C5 thecal sac without central thecal sac compression. The mass displaces the left pharynx anteriorly along the left carotid sheath.Patient had a repeat biopsy which was consistent with adenocarcinoma.  Patient underwent palliative radiation treatment to his neck mass and completed 4 cycles of carboplatin and [name] with excellent response to treatment and is currently on maintenance [name]. Repeat NGS testing was also performed which did not show any evidence of actionable mutations   Interval history-patient reports some pain in his left shoulder but he is able to raise it above his head and workout at the gym as well.  Denies other complaints at this time  ECOG PS- 1 Pain scale- 3 Opioid associated constipation- no  Review of systems- Review of Systems  Constitutional: Negative for chills, fever, malaise/fatigue and weight loss.  HENT: Negative for congestion, ear discharge and nosebleeds.   Eyes: Negative for blurred vision.  Respiratory: Negative for cough, hemoptysis, sputum production, shortness of breath and wheezing.   Cardiovascular: Negative for chest pain, palpitations, orthopnea and claudication.  Gastrointestinal: Negative for abdominal pain, blood in stool, constipation, diarrhea,  heartburn, melena, nausea and  vomiting.  Genitourinary: Negative for dysuria, flank pain, frequency, hematuria and urgency.  Musculoskeletal: Negative for back pain, joint pain and myalgias.       Left shoulder pain  Skin: Negative for rash.  Neurological: Negative for dizziness, tingling, focal weakness, seizures, weakness and headaches.  Endo/Heme/Allergies: Does not bruise/bleed easily.  Psychiatric/Behavioral: Negative for depression and suicidal ideas. The patient does not have insomnia.       No Known Allergies   Past Medical History:  Diagnosis Date  . Asthma   . Lung cancer [facility])      Past Surgical History:  Procedure Laterality Date  . CYST EXCISION    . IR IMAGING GUIDED PORT INSERTION  12/05/2019  . VIDEO BRONCHOSCOPY WITH ENDOBRONCHIAL ULTRASOUND Left 11/22/2019   Procedure: VIDEO BRONCHOSCOPY WITH ENDOBRONCHIAL ULTRASOUND;  Surgeon: Tyler Pita, MD;  Location: [facility] ORS;  Service: Thoracic;  Laterality: Left;    Social History   Socioeconomic History  . Marital status: Single    Spouse name: Not on file  . Number of children: Not on file  . Years of education: Not on file  . Highest education level: Not on file  Occupational History  . Not on file  Tobacco Use  . Smoking status: Former Smoker    Packs/day: 2.00    Types: Cigarettes    Quit date: 11/08/2019    Years since quitting: 1.4  . Smokeless tobacco: Never Used  Vaping Use  . Vaping Use: Never used  Substance and Sexual Activity  . Alcohol use: Not Currently  . Drug use: Yes    Types: Marijuana  . Sexual activity: Not on file  Other Topics Concern  . Not on file  Social History Narrative  . Not on file   Social Determinants of Health   Financial Resource Strain: Not on file  Food Insecurity: Not on file  Transportation Needs: Not on file  Physical Activity: Not on file  Stress: Not on file  Social Connections: Not on file  Intimate Partner Violence: Not on file    Family History  Problem Relation Age  of Onset  . Healthy Mother   . Healthy Father      Current Outpatient Medications:  .  folic acid (FOLVITE) 1 MG tablet, Take 1 tablet (1 mg total) by mouth daily. Start 5-7 days before [name] chemotherapy. Continue until 21 days after [name] completed., Disp: 100 tablet, Rfl: 3 .  ondansetron (ZOFRAN) 8 MG tablet, Take 1 tablet (8 mg total) by mouth 2 (two) times daily as needed (Nausea or vomiting). Start if needed on the third day after chemotherapy., Disp: 30 tablet, Rfl: 1 No current facility-administered medications for this visit.  Facility-Administered Medications Ordered in Other Visits:  .  cyanocobalamin ((VITAMIN B-12)) injection 1,000 mcg, 1,000 mcg, Intramuscular, Q30 days, Sindy Guadeloupe, MD, 1,000 mcg at 04/23/21 0933 .  heparin lock flush 100 unit/mL, 500 Units, Intravenous, Once, Sindy Guadeloupe, MD .  sodium chloride flush (NS) 0.9 % injection 10 mL, 10 mL, Intravenous, PRN, Sindy Guadeloupe, MD, 10 mL at 05/01/20 0900 .  sodium chloride flush (NS) 0.9 % injection 10 mL, 10 mL, Intravenous, PRN, Sindy Guadeloupe, MD, 10 mL at 01/29/21 0835  Physical exam:  Vitals:   04/23/21 0851  BP: (!) 140/98  Pulse: 78  Resp: 16  Temp: (!) 97 F (36.1 C)  SpO2: 100%  Weight: 185 lb 1.6 oz (84 kg)   Physical Exam  Constitutional:      General: He is not in acute distress. Cardiovascular:     Rate and Rhythm: Normal rate and regular rhythm.     Heart sounds: Normal heart sounds.  Pulmonary:     Effort: Pulmonary effort is normal.     Breath sounds: Normal breath sounds.  Abdominal:     General: Bowel sounds are normal.     Palpations: Abdomen is soft.  Skin:    General: Skin is warm and dry.  Neurological:     Mental Status: He is alert and oriented to person, place, and time.      CMP Latest Ref Rng & Units 04/23/2021  Glucose 70 - 99 mg/dL 113(H)  BUN 6 - 20 mg/dL 13  Creatinine 0.61 - 1.24 mg/dL 1.25(H)  Sodium 135 - 145 mmol/L 133(L)  Potassium 3.5 - 5.1 mmol/L  3.7  Chloride 98 - 111 mmol/L 99  CO2 22 - 32 mmol/L 25  Calcium 8.9 - 10.3 mg/dL 9.0  Total Protein 6.5 - 8.1 g/dL 7.3  Total Bilirubin 0.3 - 1.2 mg/dL 0.8  Alkaline Phos 38 - 126 U/L 102  AST 15 - 41 U/L 22  ALT 0 - 44 U/L 18   CBC Latest Ref Rng & Units 04/23/2021  WBC 4.0 - 10.5 K/uL 3.8(L)  Hemoglobin 13.0 - 17.0 g/dL 11.9(L)  Hematocrit 39.0 - 52.0 % 35.5(L)  Platelets 150 - 400 K/uL 391      Assessment and plan- Patient is a 50 y.o. male with metastatic adenocarcinoma of the lung with pleural metastases then soft tissue involvement of the neck as well as cervical vertebrae. He is here for on treatment assessment prior to cycle 7 of maintenance [name]  Counts okay to proceed with cycle 7 of maintenance [name] today.  He has baseline neutropenia and will receive 1 for Neulasta support.  Also receive B12 shot today.  Repeat CT chest abdomen and pelvis with contrast and CT soft tissue neck in 2 weeks time.  Left shoulder pain: Possibly secondary to prior involvement of cervical vertebrae from metastatic disease.  Reassess with repeat scans  AKI: Possibly secondary to [name].  Improved as compared to last time.  Continue to monitor   Visit Diagnosis 1. Malignant neoplasm of upper lobe of left lung ([place])   2. Encounter for antineoplastic chemotherapy   3. AKI (acute kidney injury) ([place])      Dr. Randa Evens, MD, MPH [facility] [phone] 04/23/2021 1:04 PM

## 2021-05-05 ENCOUNTER — Other Ambulatory Visit: Payer: Self-pay

## 2021-05-05 ENCOUNTER — Ambulatory Visit
Admission: RE | Admit: 2021-05-05 | Discharge: 2021-05-05 | Disposition: A | Discharge status: Home / Self Care | Payer: Medicaid Other | Source: Ambulatory Visit | Attending: Oncology | Admitting: Oncology

## 2021-05-05 DIAGNOSIS — C3412 Malignant neoplasm of upper lobe, left bronchus or lung: Secondary | ICD-10-CM | POA: Insufficient documentation

## 2021-05-05 IMAGING — CT CT CHEST-ABD-PELV W/ CM
2 of 3 series · 12 of 31 positions shown, 18 images · IV contrast (omnipaque)
Comparison: [DATE]

CLINICAL DATA: Left upper lobe lung cancer with metastasis to
cervical spine area. On chemotherapy. Stage IV non-small cell lung
cancer

EXAM:
CT CHEST, ABDOMEN, AND PELVIS WITH CONTRAST
TECHNIQUE: Multidetector CT imaging of the chest, abdomen and pelvis was
performed following the standard protocol during bolus
administration of intravenous contrast.
CONTRAST:  100mL OMNIPAQUE IOHEXOL 300 MG/ML  SOLN

[Series 3: cap with · axial · 0.80mm/px · z∈[-1123,-573]mm · 10 of 138 slices shown, 16 images]
[im 14/138  mediastinal]
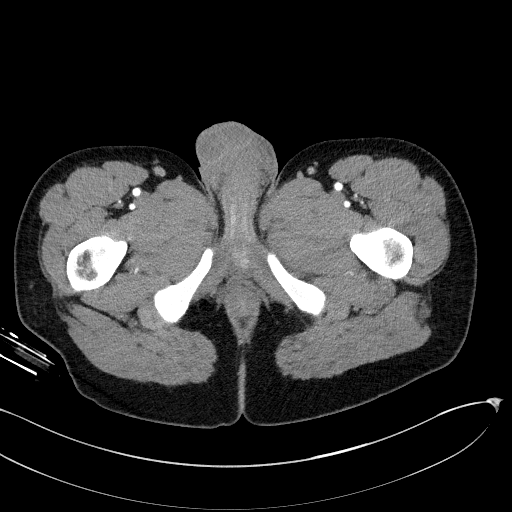
[im 14/138  bone]
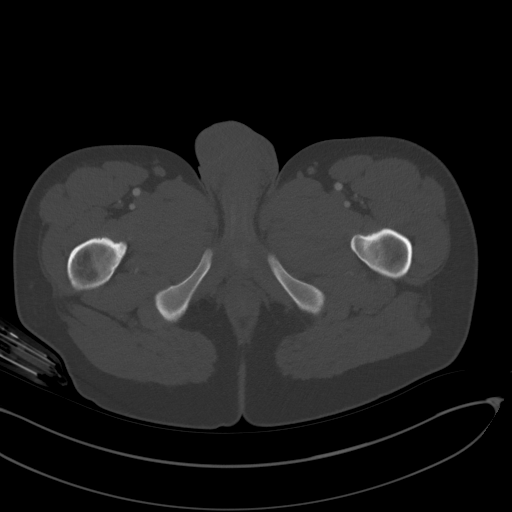
[im 28/138  mediastinal]
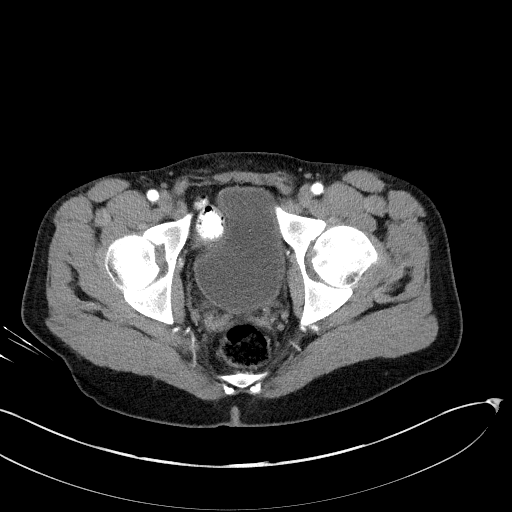
[im 42/138  mediastinal]
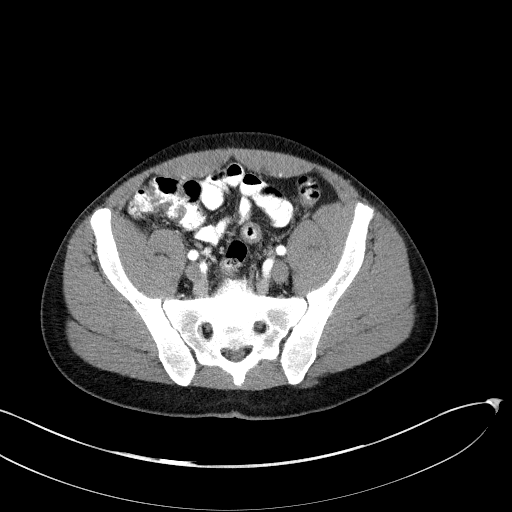
[im 55/138  mediastinal]
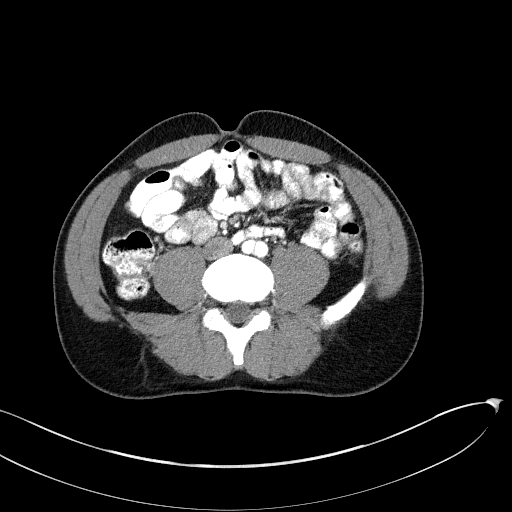
[im 68/138  mediastinal]
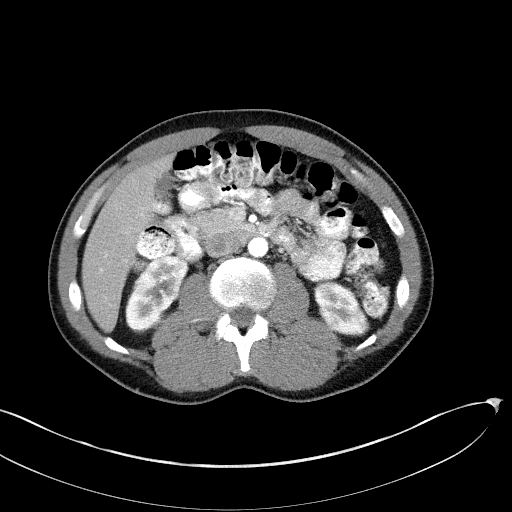
[im 69/138  mediastinal]
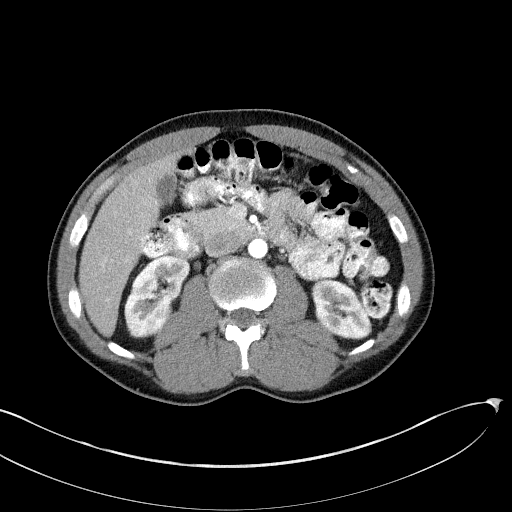
[im 83/138  mediastinal]
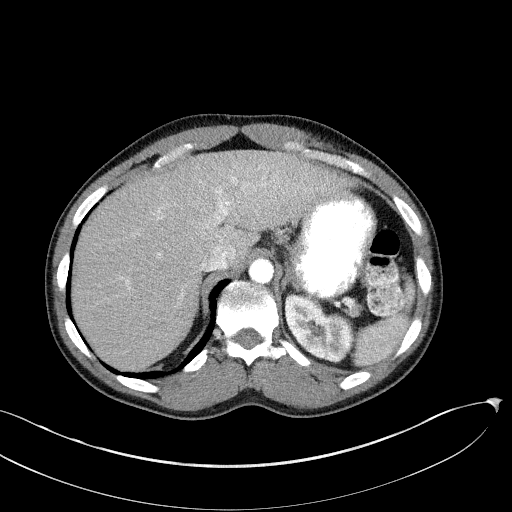
[im 83/138  lung]
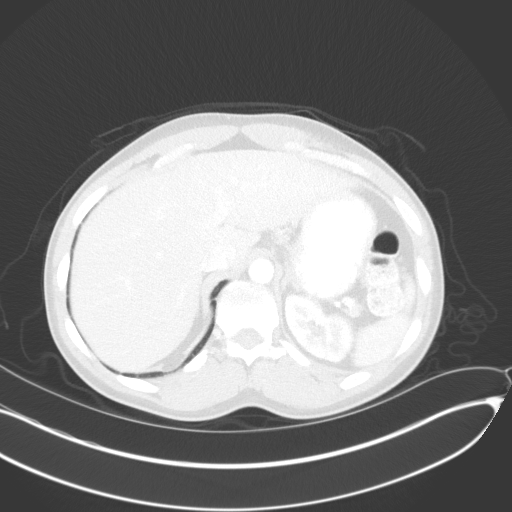
[im 96/138  mediastinal]
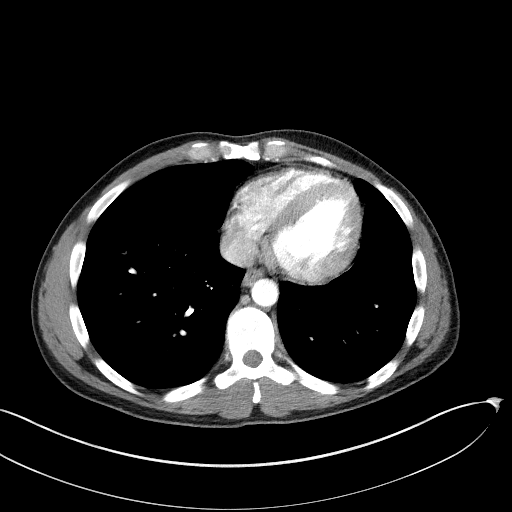
[im 96/138  lung]
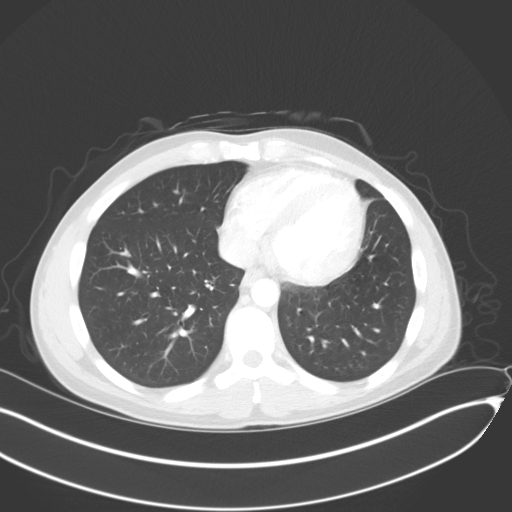
[im 110/138  mediastinal]
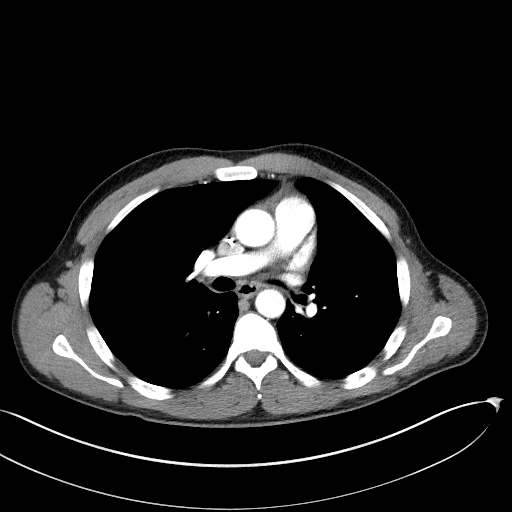
[im 110/138  lung]
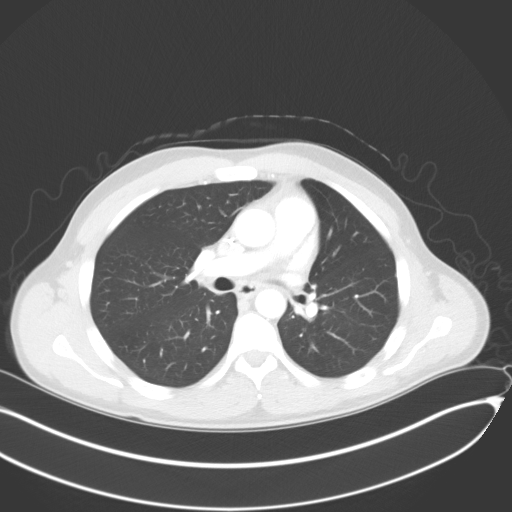
[im 110/138  bone]
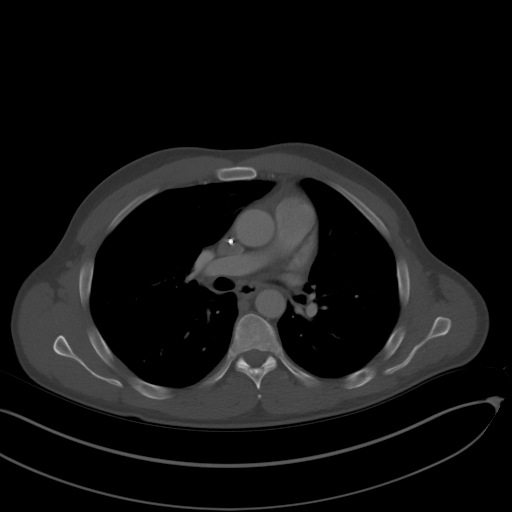
[im 124/138  mediastinal]
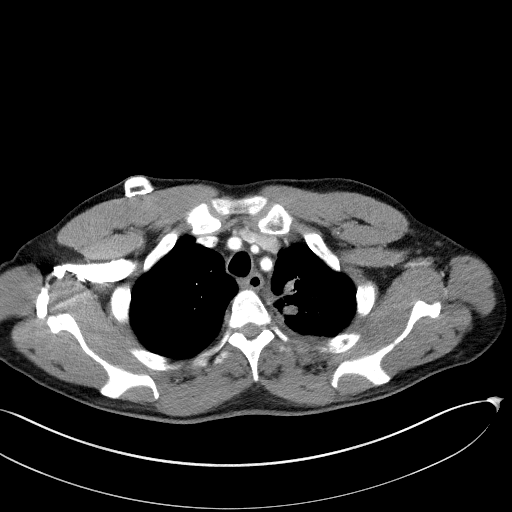
[im 124/138  lung]
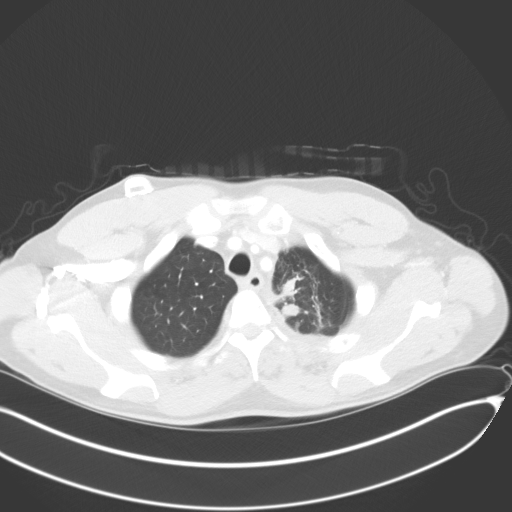

[Series 9: lung · axial · 0.80mm/px · z∈[-815,-763]mm · 2 of 169 slices shown]
[im 13/169  bone]
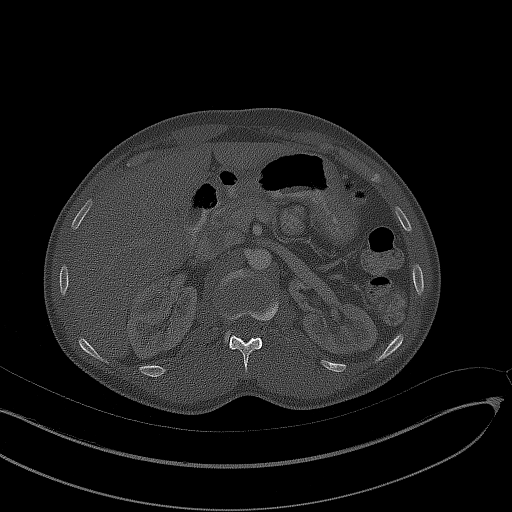
[im 39/169  bone]
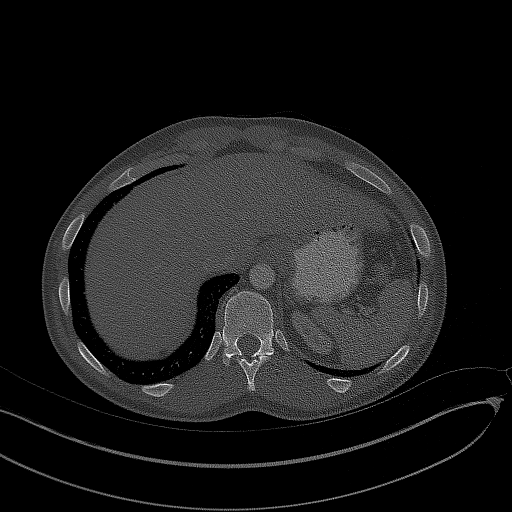

[12 of 31 positions shown; findings below may reference images not displayed]

FINDINGS: CT CHEST FINDINGS

Cardiovascular: Right Port-A-Cath tip at mid right atrium. Normal
aortic caliber. Normal heart size, without pericardial effusion. No
central pulmonary embolism, on this non-dedicated study.

Mediastinum/Nodes: No mediastinal or hilar adenopathy.

Lungs/Pleura: No pleural fluid.  Mild centrilobular emphysema

Left apical lung nodule measures 12 x 11 mm on 36/9. Compare 16 x 12
mm on the prior exam. Again identified is surrounding architectural
distortion and interstitial thickening, presumably radiation
induced. Nodularity just posterior to the dominant nodule including
at on the order of 3-4 mm on 36/9 is similar.

Musculoskeletal: Posterior left third rib nonacute fracture
including on [DATE] is similar to on the prior exam.

CT ABDOMEN PELVIS FINDINGS

Hepatobiliary: A subtle hypoattenuating segment 2 liver lesion on
50/3 measures 9 mm and is similar back to [DATE]. Favoring a
cyst or minimally complex cyst. No worrisome liver lesion. Normal
gallbladder, without biliary ductal dilatation.

Pancreas: Normal, without mass or ductal dilatation.

Spleen: Normal in size, without focal abnormality.

Adrenals/Urinary Tract: Normal adrenal glands. Heterogeneous
bilateral renal enhancement is most conspicuous on delayed images.
No suspicious renal mass or hydronephrosis. Normal urinary bladder.

Stomach/Bowel: Normal stomach, without wall thickening. Normal
colon, appendix, and terminal ileum. Minimal motion degradation
involves the abdomen. Normal small bowel.

Vascular/Lymphatic: Normal caliber of the aorta and branch vessels.
No abdominopelvic adenopathy.

Reproductive: Normal prostate.

Other: No significant free fluid. No evidence of omental or
peritoneal disease.

Musculoskeletal: Tiny pelvic sclerotic lesions are unchanged back to
[D9] and likely bone islands.
IMPRESSION: 1. Further decreased size of the left apical lung nodule with
similar surrounding radiation change.
2. No findings of thoracic nodal or extrathoracic metastasis.
3. Heterogeneous renal enhancement bilaterally. Considerations
include pyelonephritis or developing renal insufficiency. Correlate
with urinalysis.
4. Posterior left third rib nonacute fracture. Given location,
likely due to insufficiency from radiation induced necrosis.
5.  Emphysema ([D9]-[D9]).

## 2021-05-05 IMAGING — CT CT NECK W/ CM
2 of 3 series · 8 of 14 positions shown, 9 images · IV contrast (omnipaque)
Comparison: Neck CTs [DATE] and earlier.

Chest CT from the same day reported separately.

CLINICAL DATA: 49-year-old male with metastatic lung cancer.
History of bulky C3 through C6 prevertebral and involvement
eccentric to the left.

EXAM:
CT NECK WITH CONTRAST
TECHNIQUE: Multidetector CT imaging of the neck was performed using the
standard protocol following the bolus administration of intravenous
contrast.
CONTRAST:  100mL OMNIPAQUE IOHEXOL 300 MG/ML SOLN in conjunction
with contrast enhanced imaging of the chest reported separately.

[Series 5: axial neck · axial · 0.55mm/px · z∈[-560,-418]mm · 4 of 119 slices shown]
[im 24/119  bone]
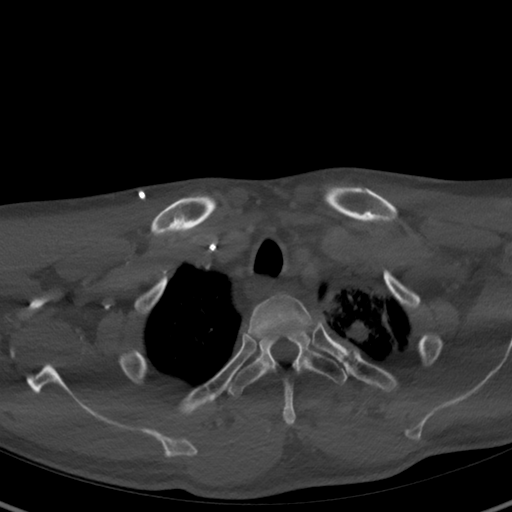
[im 48/119  bone]
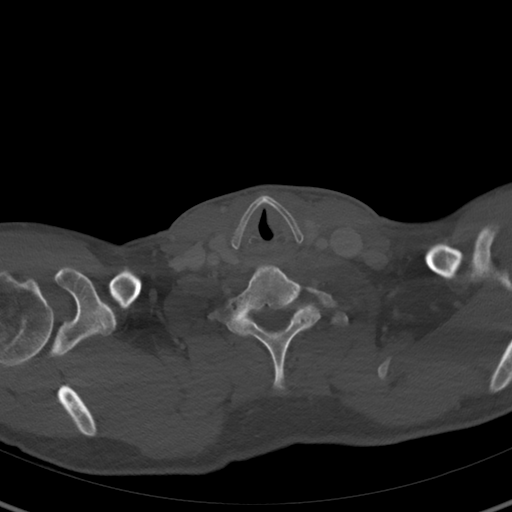
[im 71/119  bone]
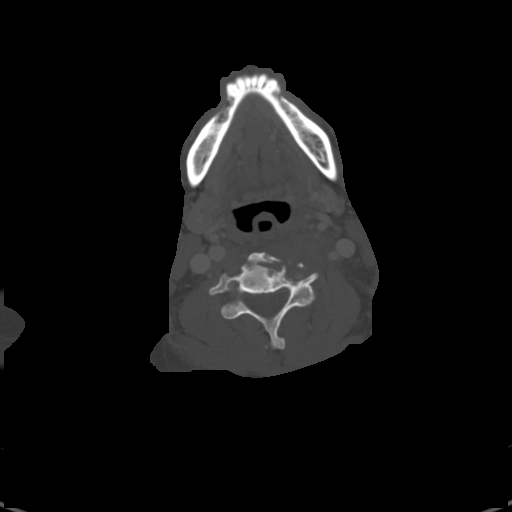
[im 95/119  bone]
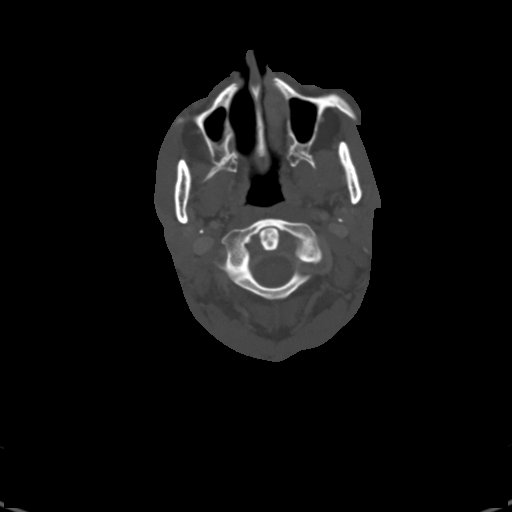

[Series 14: orthogonal ax · axial · 0.45mm/px · z∈[-599,-427]mm · 4 of 148 slices shown, 5 images]
[im 30/148  soft-tissue]
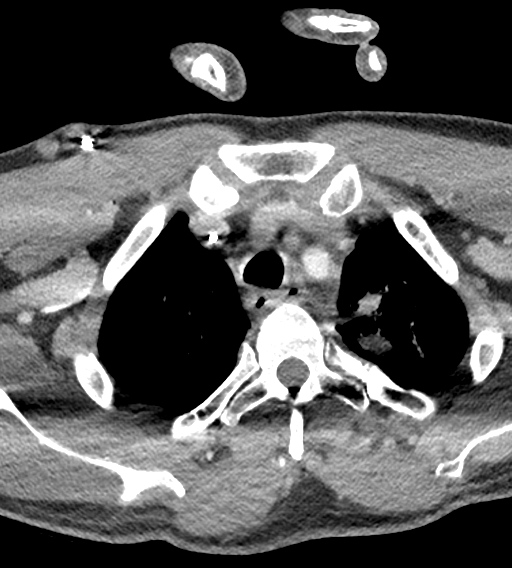
[im 30/148  bone]
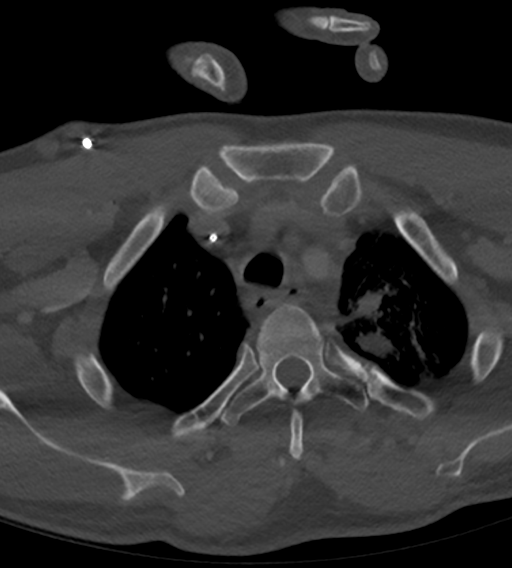
[im 59/148  bone]
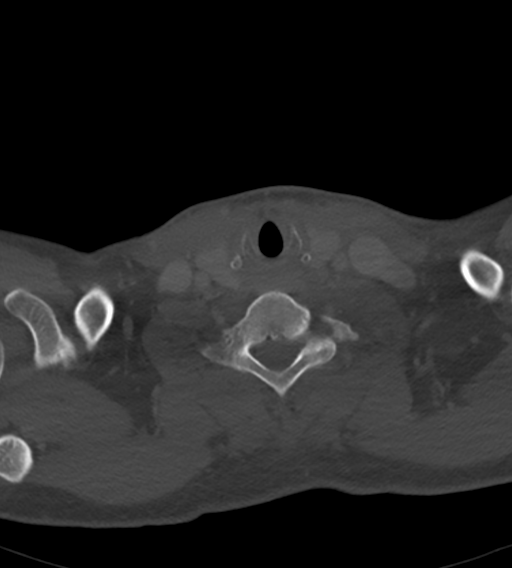
[im 89/148  bone]
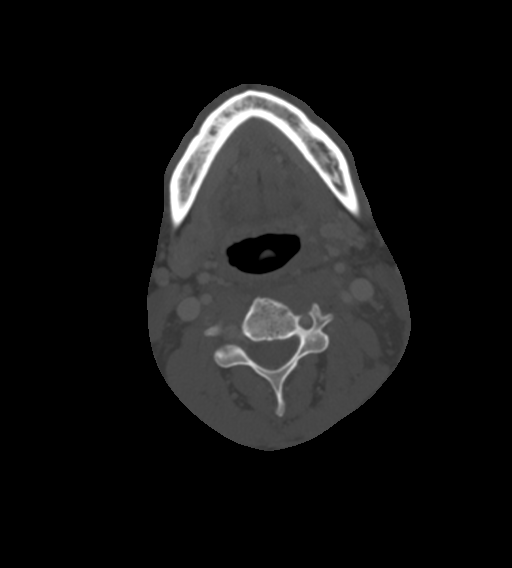
[im 118/148  bone]
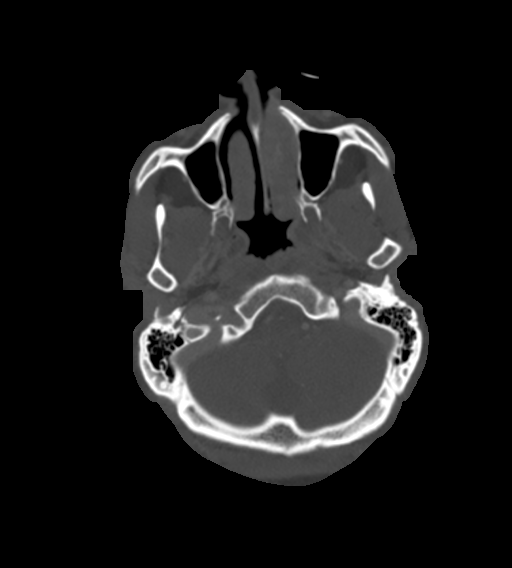

[8 of 14 positions shown; findings below may reference images not displayed]

FINDINGS: Pharynx and larynx: Asymmetry of the larynx likely due to left vocal
cord paralysis secondary to the chronic vertebral/prevertebral
metastasis. Generalized mucoperiosteal thickening compatible with
sequelae of radiation. Parapharyngeal spaces remain normal. Mild
likely post XRT retropharyngeal effusion is stable.

Salivary glands: Appearance negative sublingual space. Of the left
post XRT submandibular gland, the right appears more normal. Parotid
glands remain within normal limits.

Thyroid: Negative.

Lymph nodes: No discrete cervical lymphadenopathy.

Vascular: Right IJ Port-A-Cath remains in place. The major vascular
structures in the neck and at the skull base remain patent,
including the left vertebral artery which as before is intimately
associated with the left cervical tumor (series 5, image 57)

Limited intracranial: Negative.

Visualized orbits: Negative.

Mastoids and visualized paranasal sinuses: Visualized paranasal
sinuses and mastoids are stable and well aerated.

Skeleton: Lytic and extraosseous lobulated tumor of the mid cervical
spine with epicenter at C5 now is rim enhancing and centrally cystic
or necrotic (series 5, image 59) rendering its margins much more
visible by CT than previously. It encompasses 23 x 31 x 44 mm (AP by
transverse by CC) see series 5, images 57, 59 and coronal image 70.
this is smaller than in [REDACTED], and also seems slightly smaller
since [REDACTED].

No obvious intraspinal extension by CT.

Superimposed cervical disc and endplate degeneration at those levels
with some degenerative spinal stenosis. Carious dentition. No new
osseous lesion identified.

Upper chest: Reported separately.
IMPRESSION: 1. Left mid cervical lytic and extraosseous metastasis is now rim
enhancing and centrally cystic/necrotic rendering its margins much
more visible by CT than before. The lesion encompasses 23 x 31 x 44
mm, and appears regressed from prior exams.
2. Sequelae of XRT. No new metastatic disease identified in the
neck.
3. CT Chest the same day is reported separately.
4. Left vocal cord paresis or paralysis associated with #1. Carious
dentition.

## 2021-05-05 MED ORDER — IOHEXOL 300 MG/ML  SOLN
100.0000 mL | Freq: Once | INTRAMUSCULAR | Status: AC | PRN
  Administered 2021-05-05: 100 mL via INTRAVENOUS

## 2021-05-14 ENCOUNTER — Inpatient Hospital Stay (HOSPITAL_BASED_OUTPATIENT_CLINIC_OR_DEPARTMENT_OTHER): Payer: Medicaid Other | Admitting: Oncology

## 2021-05-14 ENCOUNTER — Encounter: Payer: Self-pay | Admitting: Oncology

## 2021-05-14 ENCOUNTER — Inpatient Hospital Stay: Payer: Medicaid Other | Attending: Oncology

## 2021-05-14 ENCOUNTER — Inpatient Hospital Stay: Payer: Medicaid Other

## 2021-05-14 ENCOUNTER — Other Ambulatory Visit: Payer: Self-pay

## 2021-05-14 VITALS — BP 125/96 | HR 79 | Temp 98.0°F | Resp 16 | Ht 71.0 in | Wt 177.0 lb

## 2021-05-14 DIAGNOSIS — Z79899 Other long term (current) drug therapy: Secondary | ICD-10-CM | POA: Insufficient documentation

## 2021-05-14 DIAGNOSIS — D701 Agranulocytosis secondary to cancer chemotherapy: Secondary | ICD-10-CM

## 2021-05-14 DIAGNOSIS — C3412 Malignant neoplasm of upper lobe, left bronchus or lung: Secondary | ICD-10-CM

## 2021-05-14 DIAGNOSIS — C7951 Secondary malignant neoplasm of bone: Secondary | ICD-10-CM | POA: Diagnosis not present

## 2021-05-14 DIAGNOSIS — Z87891 Personal history of nicotine dependence: Secondary | ICD-10-CM | POA: Diagnosis not present

## 2021-05-14 DIAGNOSIS — F1290 Cannabis use, unspecified, uncomplicated: Secondary | ICD-10-CM | POA: Insufficient documentation

## 2021-05-14 DIAGNOSIS — T451X5A Adverse effect of antineoplastic and immunosuppressive drugs, initial encounter: Secondary | ICD-10-CM

## 2021-05-14 DIAGNOSIS — Z923 Personal history of irradiation: Secondary | ICD-10-CM | POA: Diagnosis not present

## 2021-05-14 DIAGNOSIS — M25512 Pain in left shoulder: Secondary | ICD-10-CM | POA: Diagnosis not present

## 2021-05-14 DIAGNOSIS — Z95828 Presence of other vascular implants and grafts: Secondary | ICD-10-CM

## 2021-05-14 DIAGNOSIS — N179 Acute kidney failure, unspecified: Secondary | ICD-10-CM

## 2021-05-14 DIAGNOSIS — Z5189 Encounter for other specified aftercare: Secondary | ICD-10-CM | POA: Insufficient documentation

## 2021-05-14 DIAGNOSIS — C782 Secondary malignant neoplasm of pleura: Secondary | ICD-10-CM | POA: Insufficient documentation

## 2021-05-14 DIAGNOSIS — R221 Localized swelling, mass and lump, neck: Secondary | ICD-10-CM | POA: Insufficient documentation

## 2021-05-14 DIAGNOSIS — J3489 Other specified disorders of nose and nasal sinuses: Secondary | ICD-10-CM | POA: Insufficient documentation

## 2021-05-14 DIAGNOSIS — J432 Centrilobular emphysema: Secondary | ICD-10-CM | POA: Insufficient documentation

## 2021-05-14 DIAGNOSIS — Z5111 Encounter for antineoplastic chemotherapy: Secondary | ICD-10-CM | POA: Diagnosis not present

## 2021-05-14 LAB — CBC WITH DIFFERENTIAL/PLATELET
Abs Immature Granulocytes: 0.02 10*3/uL (ref 0.00–0.07)
Basophils Absolute: 0 10*3/uL (ref 0.0–0.1)
Basophils Relative: 1 %
Eosinophils Absolute: 0.2 10*3/uL (ref 0.0–0.5)
Eosinophils Relative: 4 %
HCT: 34.3 % — ABNORMAL LOW (ref 39.0–52.0)
Hemoglobin: 11.9 g/dL — ABNORMAL LOW (ref 13.0–17.0)
Immature Granulocytes: 0 %
Lymphocytes Relative: 31 %
Lymphs Abs: 1.4 10*3/uL (ref 0.7–4.0)
MCH: 32.9 pg (ref 26.0–34.0)
MCHC: 34.7 g/dL (ref 30.0–36.0)
MCV: 94.8 fL (ref 80.0–100.0)
Monocytes Absolute: 0.8 10*3/uL (ref 0.1–1.0)
Monocytes Relative: 17 %
Neutro Abs: 2.1 10*3/uL (ref 1.7–7.7)
Neutrophils Relative %: 47 %
Platelets: 395 10*3/uL (ref 150–400)
RBC: 3.62 MIL/uL — ABNORMAL LOW (ref 4.22–5.81)
RDW: 14.2 % (ref 11.5–15.5)
WBC: 4.5 10*3/uL (ref 4.0–10.5)
nRBC: 0 % (ref 0.0–0.2)

## 2021-05-14 LAB — COMPREHENSIVE METABOLIC PANEL
ALT: 15 U/L (ref 0–44)
AST: 19 U/L (ref 15–41)
Albumin: 3.7 g/dL (ref 3.5–5.0)
Alkaline Phosphatase: 102 U/L (ref 38–126)
Anion gap: 10 (ref 5–15)
BUN: 13 mg/dL (ref 6–20)
CO2: 24 mmol/L (ref 22–32)
Calcium: 8.8 mg/dL — ABNORMAL LOW (ref 8.9–10.3)
Chloride: 100 mmol/L (ref 98–111)
Creatinine, Ser: 1.33 mg/dL — ABNORMAL HIGH (ref 0.61–1.24)
GFR, Estimated: >60 mL/min (ref >60)
Glucose, Bld: 90 mg/dL (ref 70–99)
Potassium: 3.6 mmol/L (ref 3.5–5.1)
Sodium: 134 mmol/L — ABNORMAL LOW (ref 135–145)
Total Bilirubin: 0.8 mg/dL (ref 0.3–1.2)
Total Protein: 7.4 g/dL (ref 6.5–8.1)

## 2021-05-14 MED ORDER — SODIUM CHLORIDE 0.9% FLUSH
10.0000 mL | Freq: Once | INTRAVENOUS | Status: AC
Start: 2021-05-14 — End: 2021-05-14
  Administered 2021-05-14: 10 mL via INTRAVENOUS
  Filled 2021-05-14: qty 10

## 2021-05-14 MED ORDER — HEPARIN SOD (PORK) LOCK FLUSH 100 UNIT/ML IV SOLN
500.0000 [IU] | Freq: Once | INTRAVENOUS | Status: AC | PRN
  Administered 2021-05-14: 500 [IU]
  Filled 2021-05-14: qty 5

## 2021-05-14 MED ORDER — HEPARIN SOD (PORK) LOCK FLUSH 100 UNIT/ML IV SOLN
INTRAVENOUS | Status: AC
  Filled 2021-05-14: qty 5

## 2021-05-14 MED ORDER — SODIUM CHLORIDE 0.9 % IV SOLN
10.0000 mg | Freq: Once | INTRAVENOUS | Status: AC
  Administered 2021-05-14: 10 mg via INTRAVENOUS
  Filled 2021-05-14: qty 10

## 2021-05-14 MED ORDER — SODIUM CHLORIDE 0.9 % IV SOLN
375.0000 mg/m2 | Freq: Once | INTRAVENOUS | Status: AC
  Administered 2021-05-14: 750 mg via INTRAVENOUS
  Filled 2021-05-14: qty 20

## 2021-05-14 MED ORDER — PEGFILGRASTIM 6 MG/0.6ML ~~LOC~~ PSKT
6.0000 mg | PREFILLED_SYRINGE | Freq: Once | SUBCUTANEOUS | Status: AC
  Administered 2021-05-14: 6 mg via SUBCUTANEOUS
  Filled 2021-05-14: qty 0.6

## 2021-05-14 MED ORDER — SODIUM CHLORIDE 0.9 % IV SOLN
Freq: Once | INTRAVENOUS | Status: AC
  Filled 2021-05-14: qty 250

## 2021-05-14 NOTE — Progress Notes (Signed)
Pt has sorenes on left side of neck- he does not say it is painful just sore tender spot. No other concerns

## 2021-05-14 NOTE — Patient Instructions (Signed)
[facility] ONCOLOGY  Discharge Instructions: Thank you for choosing [facility] to provide your oncology and hematology care.  If you have a lab appointment with the [facility], please go directly to the [place] and check in at the registration area.  Wear comfortable clothing and clothing appropriate for easy access to any Portacath or PICC line.   We strive to give you quality time with your provider. You may need to reschedule your appointment if you arrive late (15 or more minutes).  Arriving late affects you and other patients whose appointments are after yours.  Also, if you miss three or more appointments without notifying the office, you may be dismissed from the clinic at the provider's discretion.      For prescription refill requests, have your pharmacy contact our office and allow 72 hours for refills to be completed.    Today you received the following chemotherapy and/or immunotherapy agents : [name] / Neulasta Onpro    To help prevent nausea and vomiting after your treatment, we encourage you to take your nausea medication as directed.  BELOW ARE SYMPTOMS THAT SHOULD BE REPORTED IMMEDIATELY: . *FEVER GREATER THAN 100.4 F (38 C) OR HIGHER . *CHILLS OR SWEATING . *NAUSEA AND VOMITING THAT IS NOT CONTROLLED WITH YOUR NAUSEA MEDICATION . *UNUSUAL SHORTNESS OF BREATH . *UNUSUAL BRUISING OR BLEEDING . *URINARY PROBLEMS (pain or burning when urinating, or frequent urination) . *BOWEL PROBLEMS (unusual diarrhea, constipation, pain near the anus) . TENDERNESS IN MOUTH AND THROAT WITH OR WITHOUT PRESENCE OF ULCERS (sore throat, sores in mouth, or a toothache) . UNUSUAL RASH, SWELLING OR PAIN  . UNUSUAL VAGINAL DISCHARGE OR ITCHING   Items with * indicate a potential emergency and should be followed up as soon as possible or go to the Emergency Department if any problems should occur.  Please show the CHEMOTHERAPY ALERT CARD or  IMMUNOTHERAPY ALERT CARD at check-in to the Emergency Department and triage nurse.  Should you have questions after your visit or need to cancel or reschedule your appointment, please contact [facility]  [phone] and follow the prompts.  Office hours are 8:00 a.m. to 4:30 p.m. Monday - Friday. Please note that voicemails left after 4:00 p.m. may not be returned until the following business day.  We are closed weekends and major holidays. You have access to a nurse at all times for urgent questions. Please call the main number to the clinic [phone] and follow the prompts.  For any non-urgent questions, you may also contact your provider using MyChart. We now offer e-Visits for anyone 45 and older to request care online for non-urgent symptoms. For details visit [email or web address].   Also download the MyChart app! Go to the app store, search "MyChart", open the app, select [facility], and log in with your MyChart username and password.  Due to Covid, a mask is required upon entering the hospital/clinic. If you do not have a mask, one will be given to you upon arrival. For doctor visits, patients may have 1 support person aged 49 or older with them. For treatment visits, patients cannot have anyone with them due to current Covid guidelines and our immunocompromised population.

## 2021-05-14 NOTE — Progress Notes (Signed)
Hematology/Oncology Consult note [facility]  [phone] [phone]  Patient Care Team: Default, Provider, MD as PCP - General Telford Nab, RN as Registered Nurse Sindy Guadeloupe, MD as Consulting Physician (Hematology and Oncology) Sindy Guadeloupe, MD as Consulting Physician (Hematology and Oncology)   Name of the patient: Kyle Guerrero  [id number]  January 17, 1971   Date of visit: 05/14/21  Diagnosis- Non-small cell lung cancer stage IV acT2 cN2 cM1 a with pleural involvement   Chief complaint/ Reason for visit-on treatment assessment prior to cycle 9 of maintenance [name] and discussed CT scan results  Heme/Onc history: patient is a 50 year old male who presented to the ER with symptoms ofheaviness in his chest and upper left chest discomfort he underwent CT angio chest to rule out PE which showed 3.8 x 3.3 cm left upper palpable lung mass along with 4.4 x 3.3 cm lobulated mass in the aortopulmonary window and 2.6 x 2.4 cm left hilar mass all concerning for malignancy. Patient has also seen pulmonary and has been set up for bronchoscopy and EBUS guided biopsy on 11/23/2019. Patient underwent. PET CT scan which showed a hypermetabolic spiculated 3.5 cm of 5 left upper lobe lung mass, adjacent hypermetabolic 3.2 x 1.2 cm pleural metastases in the medial posterior by the left pleural space along with scalloping of the adjacent posterior left third rib. Hypermetabolic infiltrative left perihilar conglomerate nodal metastases measuring up to 7.3 x 3.6 cm and 0.8 cm high left mediastinal node between the left brachiocephalic vein and left subclavian artery. No evidence of distant metastatic disease  Biopsy showed non-small cell lung cancer but further characterization could not be determined. Insufficient tissue for NGS testing. Repeat biopsy done. Results of NGStestingshowed PD-L1 50%. Tumor mutational burden high. 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  Patient completed concurrent chemoradiation with carbotaxol chemotherapy followed by 2 cycles of carbotaxol Keytruda andwason maintenance Keytruda  Patient was complaining of left shoulder pain and underwent MRI of the shoulder which showed8 in the supraspinatus tendon. He was subsequently seen by [facility] and underwent MRI of the cervical spine which showed a 4.6 x 4.8 x 8.2 cm mass in the left prevertebral region from C2-C7 levels. The mass partially encases the left vertebral artery and abuts the preforaminal segment. Invades the vertebral bodies and edema of the left C5 articular pillar. There is slight extension into the left C4-C5 thecal sac without central thecal sac compression. The mass displaces the left pharynx anteriorly along the left carotid sheath.Patient had a repeat biopsy which was consistent with adenocarcinoma.  Patient underwent palliative radiation treatment to his neck mass and completed 4 cycles of carboplatin and [name] with excellent response to treatment and is currently on maintenance [name]. Repeat NGS testing was also performed which did not show any evidence of actionable mutations   Interval history-patient reports some soreness in his left shoulder which has remained unchanged but he has full range of motion and is able to work out.  Denies other complaints at this time  ECOG PS- 0 Pain scale- 0   Review of systems- Review of Systems  Constitutional: Negative for chills, fever, malaise/fatigue and weight loss.  HENT: Negative for congestion, ear discharge and nosebleeds.   Eyes: Negative for blurred vision.  Respiratory: Negative for cough, hemoptysis, sputum production, shortness of breath and wheezing.   Cardiovascular: Negative for chest pain, palpitations, orthopnea and claudication.  Gastrointestinal: Negative for abdominal pain, blood in  stool, constipation, diarrhea,  heartburn, melena, nausea and vomiting.  Genitourinary: Negative for dysuria, flank pain, frequency, hematuria and urgency.  Musculoskeletal: Negative for back pain, joint pain and myalgias.  Skin: Negative for rash.  Neurological: Negative for dizziness, tingling, focal weakness, seizures, weakness and headaches.  Endo/Heme/Allergies: Does not bruise/bleed easily.  Psychiatric/Behavioral: Negative for depression and suicidal ideas. The patient does not have insomnia.        No Known Allergies   Past Medical History:  Diagnosis Date  . Asthma   . Lung cancer [facility])      Past Surgical History:  Procedure Laterality Date  . CYST EXCISION    . IR IMAGING GUIDED PORT INSERTION  12/05/2019  . VIDEO BRONCHOSCOPY WITH ENDOBRONCHIAL ULTRASOUND Left 11/22/2019   Procedure: VIDEO BRONCHOSCOPY WITH ENDOBRONCHIAL ULTRASOUND;  Surgeon: Tyler Pita, MD;  Location: [facility] ORS;  Service: Thoracic;  Laterality: Left;    Social History   Socioeconomic History  . Marital status: Single    Spouse name: Not on file  . Number of children: Not on file  . Years of education: Not on file  . Highest education level: Not on file  Occupational History  . Not on file  Tobacco Use  . Smoking status: Former Smoker    Packs/day: 2.00    Types: Cigarettes    Quit date: 11/08/2019    Years since quitting: 1.5  . Smokeless tobacco: Never Used  Vaping Use  . Vaping Use: Never used  Substance and Sexual Activity  . Alcohol use: Not Currently  . Drug use: Yes    Types: Marijuana  . Sexual activity: Not on file  Other Topics Concern  . Not on file  Social History Narrative  . Not on file   Social Determinants of Health   Financial Resource Strain: Not on file  Food Insecurity: Not on file  Transportation Needs: Not on file  Physical Activity: Not on file  Stress: Not on file  Social Connections: Not on file  Intimate Partner Violence: Not on file    Family History  Problem Relation  Age of Onset  . Healthy Mother   . Healthy Father      Current Outpatient Medications:  .  folic acid (FOLVITE) 1 MG tablet, Take 1 tablet (1 mg total) by mouth daily. Start 5-7 days before [name] chemotherapy. Continue until 21 days after [name] completed., Disp: 100 tablet, Rfl: 3 .  ondansetron (ZOFRAN) 8 MG tablet, Take 1 tablet (8 mg total) by mouth 2 (two) times daily as needed (Nausea or vomiting). Start if needed on the third day after chemotherapy., Disp: 30 tablet, Rfl: 1 No current facility-administered medications for this visit.  Facility-Administered Medications Ordered in Other Visits:  .  heparin lock flush 100 unit/mL, 500 Units, Intravenous, Once, Randa Evens C, MD .  sodium chloride flush (NS) 0.9 % injection 10 mL, 10 mL, Intravenous, PRN, Sindy Guadeloupe, MD, 10 mL at 05/01/20 0900 .  sodium chloride flush (NS) 0.9 % injection 10 mL, 10 mL, Intravenous, PRN, Sindy Guadeloupe, MD, 10 mL at 01/29/21 0835  Physical exam:  Vitals:   05/14/21 0939 05/14/21 0946  BP:  (!) 125/96  Pulse:  79  Resp:  16  Temp:  98 F (36.7 C)  TempSrc:  Oral  Weight: 177 lb (80.3 kg)   Height: 5' 11"  (1.803 m)    Physical Exam HENT:     Head: Normocephalic and atraumatic.  Eyes:  Pupils: Pupils are equal, round, and reactive to light.  Cardiovascular:     Rate and Rhythm: Normal rate and regular rhythm.     Heart sounds: Normal heart sounds.  Pulmonary:     Effort: Pulmonary effort is normal.     Breath sounds: Normal breath sounds.  Abdominal:     General: Bowel sounds are normal.     Palpations: Abdomen is soft.  Musculoskeletal:     Cervical back: Normal range of motion.  Skin:    General: Skin is warm and dry.  Neurological:     Mental Status: He is alert and oriented to person, place, and time.      CMP Latest Ref Rng & Units 05/14/2021  Glucose 70 - 99 mg/dL 90  BUN 6 - 20 mg/dL 13  Creatinine 0.61 - 1.24 mg/dL 1.33(H)  Sodium 135 - 145 mmol/L 134(L)   Potassium 3.5 - 5.1 mmol/L 3.6  Chloride 98 - 111 mmol/L 100  CO2 22 - 32 mmol/L 24  Calcium 8.9 - 10.3 mg/dL 8.8(L)  Total Protein 6.5 - 8.1 g/dL 7.4  Total Bilirubin 0.3 - 1.2 mg/dL 0.8  Alkaline Phos 38 - 126 U/L 102  AST 15 - 41 U/L 19  ALT 0 - 44 U/L 15   CBC Latest Ref Rng & Units 05/14/2021  WBC 4.0 - 10.5 K/uL 4.5  Hemoglobin 13.0 - 17.0 g/dL 11.9(L)  Hematocrit 39.0 - 52.0 % 34.3(L)  Platelets 150 - 400 K/uL 395    No images are attached to the encounter.  CT SOFT TISSUE NECK W CONTRAST  Result Date: 05/06/2021 CLINICAL DATA:  50 year old male with metastatic lung cancer. History of bulky C3 through C6 prevertebral and involvement eccentric to the left. EXAM: CT NECK WITH CONTRAST TECHNIQUE: Multidetector CT imaging of the neck was performed using the standard protocol following the bolus administration of intravenous contrast. CONTRAST:  131m OMNIPAQUE IOHEXOL 300 MG/ML SOLN in conjunction with contrast enhanced imaging of the chest reported separately. COMPARISON:  Neck CTs 02/11/2021 and earlier. Chest CT from the same day reported separately. FINDINGS: Pharynx and larynx: Asymmetry of the larynx likely due to left vocal cord paralysis secondary to the chronic vertebral/prevertebral metastasis. Generalized mucoperiosteal thickening compatible with sequelae of radiation. Parapharyngeal spaces remain normal. Mild likely post XRT retropharyngeal effusion is stable. Salivary glands: Appearance negative sublingual space. Of the left post XRT submandibular gland, the right appears more normal. Parotid glands remain within normal limits. Thyroid: Negative. Lymph nodes: No discrete cervical lymphadenopathy. Vascular: Right IJ Port-A-Cath remains in place. The major vascular structures in the neck and at the skull base remain patent, including the left vertebral artery which as before is intimately associated with the left cervical tumor (series 5, image 57) Limited intracranial: Negative.  Visualized orbits: Negative. Mastoids and visualized paranasal sinuses: Visualized paranasal sinuses and mastoids are stable and well aerated. Skeleton: Lytic and extraosseous lobulated tumor of the mid cervical spine with epicenter at C5 now is rim enhancing and centrally cystic or necrotic (series 5, image 59) rendering its margins much more visible by CT than previously. It encompasses 23 x 31 x 44 mm (AP by transverse by CC) see series 5, images 57, 59 and coronal image 70. this is smaller than in November, and also seems slightly smaller since February. No obvious intraspinal extension by CT. Superimposed cervical disc and endplate degeneration at those levels with some degenerative spinal stenosis. Carious dentition. No new osseous lesion identified. Upper chest: Reported separately. IMPRESSION:  1. Left mid cervical lytic and extraosseous metastasis is now rim enhancing and centrally cystic/necrotic rendering its margins much more visible by CT than before. The lesion encompasses 23 x 31 x 44 mm, and appears regressed from prior exams. 2. Sequelae of XRT. No new metastatic disease identified in the neck. 3. CT Chest the same day is reported separately. 4. 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 Carious dentition. Electronically Signed   By: Genevie Ann M.D.   On: 05/06/2021 06:09   CT CHEST ABDOMEN PELVIS W CONTRAST  Result Date: 05/05/2021 CLINICAL DATA:  Left upper lobe lung cancer with metastasis to cervical spine area. On chemotherapy. Stage IV non-small cell lung cancer EXAM: CT CHEST, ABDOMEN, AND PELVIS WITH CONTRAST TECHNIQUE: Multidetector CT imaging of the chest, abdomen and pelvis was performed following the standard protocol during bolus administration of intravenous contrast. CONTRAST:  177m OMNIPAQUE IOHEXOL 300 MG/ML  SOLN COMPARISON:  02/11/2021 FINDINGS: CT CHEST FINDINGS Cardiovascular: Right Port-A-Cath tip at mid right atrium. Normal aortic caliber. Normal heart size,  without pericardial effusion. No central pulmonary embolism, on this non-dedicated study. Mediastinum/Nodes: No mediastinal or hilar adenopathy. Lungs/Pleura: No pleural fluid.  Mild centrilobular emphysema Left apical lung nodule measures 12 x 11 mm on 36/9. Compare 16 x 12 mm on the prior exam. Again identified is surrounding architectural distortion and interstitial thickening, presumably radiation induced. Nodularity just posterior to the dominant nodule including at on the order of 3-4 mm on 36/9 is similar. Musculoskeletal: Posterior left third rib nonacute fracture including on 12/03 is similar to on the prior exam. CT ABDOMEN PELVIS FINDINGS Hepatobiliary: A subtle hypoattenuating segment 2 liver lesion on 50/3 measures 9 mm and is similar back to 02/03/2020. Favoring a cyst or minimally complex cyst. No worrisome liver lesion. Normal gallbladder, without biliary ductal dilatation. Pancreas: Normal, without mass or ductal dilatation. Spleen: Normal in size, without focal abnormality. Adrenals/Urinary Tract: Normal adrenal glands. Heterogeneous bilateral renal enhancement is most conspicuous on delayed images. No suspicious renal mass or hydronephrosis. Normal urinary bladder. Stomach/Bowel: Normal stomach, without wall thickening. Normal colon, appendix, and terminal ileum. Minimal motion degradation involves the abdomen. Normal small bowel. Vascular/Lymphatic: Normal caliber of the aorta and branch vessels. No abdominopelvic adenopathy. Reproductive: Normal prostate. Other: No significant free fluid. No evidence of omental or peritoneal disease. Musculoskeletal: Tiny pelvic sclerotic lesions are unchanged back to 2021 and likely bone islands. IMPRESSION: 1. Further decreased size of the left apical lung nodule with similar surrounding radiation change. 2. No findings of thoracic nodal or extrathoracic metastasis. 3. Heterogeneous renal enhancement bilaterally. Considerations include pyelonephritis or  developing renal insufficiency. Correlate with urinalysis. 4. Posterior left third rib nonacute fracture. Given location, likely due to insufficiency from radiation induced necrosis. 5.  Emphysema (ICD10-J43.9). Electronically Signed   By: KAbigail MiyamotoM.D.   On: 05/05/2021 21:14     Assessment and plan- Patient is a 50y.o. male with metastatic adenocarcinoma of the lung with pleural metastases then soft tissue involvement of the neck as well as cervical vertebrae.  He is here for on treatment assessment prior to cycle 8 of maintenance [name]  Counts okay to proceed with cycle 8 of maintenance [name] today.  He will be also getting Neulasta as he has significant chemo induced neutropenia without that.  We have also dose reduced [name] to 375 mg per metered square.  Labs and treatment in 3 weeks in 6 weeks and I will see him in 6 weeks.  See covering MD/NP in 3 weeks  I have reviewed the results of CT soft tissue neck as well as CT chest abdomen pelvis with the patient.  The lesion in his neck involving the cervical vertebrae and extraosseous metastases continues to regress.  Left upper lobe lung nodule now 12 x 11 mm as compared to 16 x 12 mm prior.  No other evidence of systemic disease.  Plan is to continue [name] until progression or toxicity  AKI: Possibly secondary to [name].  This was noted on his CT abdomen as well.  No clinical signs and symptoms of pyelonephritis/dysuria.  We will hold off on checking urinalysis at this time.  Encouraged the patient to improve his oral intake of fluids.  If creatinine gets worse I will refer him to nephrology at that time  Patient will continue oral folate and will get B12 shot in 6 weeks   Visit Diagnosis 1. Malignant neoplasm of upper lobe of left lung ([place])   2. Encounter for antineoplastic chemotherapy   3. AKI (acute kidney injury) ([place])   4. Chemotherapy induced neutropenia ([facility])      Dr. Randa Evens, MD, MPH [facility] at [facility] [phone] 05/14/2021 12:39 PM

## 2021-05-16 ENCOUNTER — Other Ambulatory Visit: Payer: Self-pay | Admitting: Adult Health

## 2021-05-16 DIAGNOSIS — C3412 Malignant neoplasm of upper lobe, left bronchus or lung: Secondary | ICD-10-CM

## 2021-05-16 NOTE — Progress Notes (Signed)
I connected by phone with Cindie Laroche on 05/16/2021, 1:28 PM to discuss the potential use of a new treatment, tixagevimab/cilgavimab, for pre-exposure prophylaxis for prevention of coronavirus disease 2019 (COVID-19) caused by the SARS-CoV-2 virus.  This patient is a 50 y.o. male that meets the FDA criteria for Emergency Use Authorization of tixagevimab/cilgavimab for pre-exposure prophylaxis of COVID-19 disease. Pt meets following criteria:  Age >12 yr and weight > 40kg  Not currently infected with SARS-CoV-2 and has no known recent exposure to an individual infected with SARS-CoV-2 AND o Who has moderate to severe immune compromise due to a medical condition or receipt of immunosuppressive medications or treatments and may not mount an adequate immune response to COVID-19 vaccination or  o Vaccination with any available COVID-19 vaccine, according to the approved or authorized schedule, is not recommended due to a history of severe adverse reaction (e.g., severe allergic reaction) to a COVID-19 vaccine(s) and/or COVID-19 vaccine component(s).  o Patient meets the following definition of mod-severe immune compromised status: 7. Solid tumor malignancies on immunomodulatory chemotherapy or advanced AIDS   I have spoken and communicated the following to the patient or parent/caregiver regarding COVID monoclonal antibody treatment:  1. FDA has authorized the emergency use of tixagevimab/cilgavimab for the pre-exposure prophylaxis of COVID-19 in patients with moderate-severe immunocompromised status, who meet above EUA criteria.  2. The significant known and potential risks and benefits of COVID monoclonal antibody, and the extent to which such potential risks and benefits are unknown.  3. Information on available alternative treatments and the risks and benefits of those alternatives, including clinical trials.  4. The patient or parent/caregiver has the option to accept or refuse COVID  monoclonal antibody treatment.  After reviewing this information with the patient, agree to receive tixagevimab/cilgavimab. Will receive on June 04, 2021 prior to his treatment.  Scot Dock, NP, 05/16/2021, 1:28 PM

## 2021-06-03 ENCOUNTER — Other Ambulatory Visit: Payer: Self-pay

## 2021-06-03 DIAGNOSIS — C3412 Malignant neoplasm of upper lobe, left bronchus or lung: Secondary | ICD-10-CM

## 2021-06-04 ENCOUNTER — Inpatient Hospital Stay: Payer: Medicaid Other | Attending: Oncology

## 2021-06-04 ENCOUNTER — Telehealth: Payer: Self-pay | Admitting: Oncology

## 2021-06-04 ENCOUNTER — Inpatient Hospital Stay (HOSPITAL_BASED_OUTPATIENT_CLINIC_OR_DEPARTMENT_OTHER): Payer: Medicaid Other | Admitting: Oncology

## 2021-06-04 ENCOUNTER — Other Ambulatory Visit: Payer: Self-pay

## 2021-06-04 ENCOUNTER — Encounter: Payer: Self-pay | Admitting: Oncology

## 2021-06-04 ENCOUNTER — Inpatient Hospital Stay: Payer: Medicaid Other

## 2021-06-04 VITALS — BP 145/103 | HR 99 | Temp 98.9°F | Resp 20 | Wt 173.6 lb

## 2021-06-04 DIAGNOSIS — C782 Secondary malignant neoplasm of pleura: Secondary | ICD-10-CM | POA: Insufficient documentation

## 2021-06-04 DIAGNOSIS — C3412 Malignant neoplasm of upper lobe, left bronchus or lung: Secondary | ICD-10-CM | POA: Insufficient documentation

## 2021-06-04 DIAGNOSIS — M25512 Pain in left shoulder: Secondary | ICD-10-CM | POA: Insufficient documentation

## 2021-06-04 DIAGNOSIS — R29898 Other symptoms and signs involving the musculoskeletal system: Secondary | ICD-10-CM | POA: Diagnosis not present

## 2021-06-04 DIAGNOSIS — Z95828 Presence of other vascular implants and grafts: Secondary | ICD-10-CM

## 2021-06-04 DIAGNOSIS — R531 Weakness: Secondary | ICD-10-CM | POA: Insufficient documentation

## 2021-06-04 DIAGNOSIS — Z79899 Other long term (current) drug therapy: Secondary | ICD-10-CM | POA: Insufficient documentation

## 2021-06-04 DIAGNOSIS — Z5112 Encounter for antineoplastic immunotherapy: Secondary | ICD-10-CM | POA: Diagnosis not present

## 2021-06-04 LAB — CBC WITH DIFFERENTIAL/PLATELET
Abs Immature Granulocytes: 0.02 10*3/uL (ref 0.00–0.07)
Basophils Absolute: 0 10*3/uL (ref 0.0–0.1)
Basophils Relative: 1 %
Eosinophils Absolute: 0.1 10*3/uL (ref 0.0–0.5)
Eosinophils Relative: 2 %
HCT: 37.1 % — ABNORMAL LOW (ref 39.0–52.0)
Hemoglobin: 12.6 g/dL — ABNORMAL LOW (ref 13.0–17.0)
Immature Granulocytes: 0 %
Lymphocytes Relative: 28 %
Lymphs Abs: 1.6 10*3/uL (ref 0.7–4.0)
MCH: 31.8 pg (ref 26.0–34.0)
MCHC: 34 g/dL (ref 30.0–36.0)
MCV: 93.7 fL (ref 80.0–100.0)
Monocytes Absolute: 0.9 10*3/uL (ref 0.1–1.0)
Monocytes Relative: 15 %
Neutro Abs: 3.1 10*3/uL (ref 1.7–7.7)
Neutrophils Relative %: 54 %
Platelets: 426 10*3/uL — ABNORMAL HIGH (ref 150–400)
RBC: 3.96 MIL/uL — ABNORMAL LOW (ref 4.22–5.81)
RDW: 14.3 % (ref 11.5–15.5)
WBC: 5.6 10*3/uL (ref 4.0–10.5)
nRBC: 0 % (ref 0.0–0.2)

## 2021-06-04 LAB — COMPREHENSIVE METABOLIC PANEL
ALT: 17 U/L (ref 0–44)
AST: 20 U/L (ref 15–41)
Albumin: 3.9 g/dL (ref 3.5–5.0)
Alkaline Phosphatase: 119 U/L (ref 38–126)
Anion gap: 11 (ref 5–15)
BUN: 14 mg/dL (ref 6–20)
CO2: 24 mmol/L (ref 22–32)
Calcium: 9.2 mg/dL (ref 8.9–10.3)
Chloride: 98 mmol/L (ref 98–111)
Creatinine, Ser: 1.31 mg/dL — ABNORMAL HIGH (ref 0.61–1.24)
GFR, Estimated: >60 mL/min (ref >60)
Glucose, Bld: 115 mg/dL — ABNORMAL HIGH (ref 70–99)
Potassium: 3.6 mmol/L (ref 3.5–5.1)
Sodium: 133 mmol/L — ABNORMAL LOW (ref 135–145)
Total Bilirubin: 0.5 mg/dL (ref 0.3–1.2)
Total Protein: 8.2 g/dL — ABNORMAL HIGH (ref 6.5–8.1)

## 2021-06-04 LAB — TSH: TSH: 1.273 u[IU]/mL (ref 0.350–4.500)

## 2021-06-04 MED ORDER — DEXAMETHASONE 4 MG PO TABS: 4.0000 mg | ORAL_TABLET | Freq: Three times a day (TID) | ORAL | 0 refills | Status: DC

## 2021-06-04 MED ORDER — OXYCODONE HCL 5 MG PO TABS: 5.0000 mg | ORAL_TABLET | ORAL | 0 refills | Status: DC | PRN

## 2021-06-04 MED ORDER — TIXAGEVIMAB (PART OF EVUSHELD) INJECTION
300.0000 mg | Freq: Once | INTRAMUSCULAR | Status: AC
  Administered 2021-06-04: 300 mg via INTRAMUSCULAR
  Filled 2021-06-04: qty 3

## 2021-06-04 MED ORDER — HEPARIN SOD (PORK) LOCK FLUSH 100 UNIT/ML IV SOLN
500.0000 [IU] | Freq: Once | INTRAVENOUS | Status: AC
  Filled 2021-06-04: qty 5

## 2021-06-04 MED ORDER — BACLOFEN 10 MG PO TABS: 10.0000 mg | ORAL_TABLET | Freq: Three times a day (TID) | ORAL | 0 refills | Status: DC

## 2021-06-04 MED ORDER — PEGFILGRASTIM 6 MG/0.6ML ~~LOC~~ PSKT
6.0000 mg | PREFILLED_SYRINGE | Freq: Once | SUBCUTANEOUS | Status: AC
  Administered 2021-06-04: 6 mg via SUBCUTANEOUS
  Filled 2021-06-04: qty 0.6

## 2021-06-04 MED ORDER — HEPARIN SOD (PORK) LOCK FLUSH 100 UNIT/ML IV SOLN
INTRAVENOUS | Status: AC
  Filled 2021-06-04: qty 5

## 2021-06-04 MED ORDER — SODIUM CHLORIDE 0.9 % IV SOLN
10.0000 mg | Freq: Once | INTRAVENOUS | Status: AC
  Administered 2021-06-04: 10 mg via INTRAVENOUS
  Filled 2021-06-04: qty 10

## 2021-06-04 MED ORDER — SODIUM CHLORIDE 0.9 % IV SOLN
Freq: Once | INTRAVENOUS | Status: AC
  Filled 2021-06-04: qty 250

## 2021-06-04 MED ORDER — SODIUM CHLORIDE 0.9 % IV SOLN
375.0000 mg/m2 | Freq: Once | INTRAVENOUS | Status: AC
  Administered 2021-06-04: 750 mg via INTRAVENOUS
  Filled 2021-06-04: qty 20

## 2021-06-04 MED ORDER — CILGAVIMAB (PART OF EVUSHELD) INJECTION
300.0000 mg | Freq: Once | INTRAMUSCULAR | Status: AC
  Administered 2021-06-04: 300 mg via INTRAMUSCULAR
  Filled 2021-06-04: qty 3

## 2021-06-04 NOTE — Telephone Encounter (Signed)
Spoke with patient to let him know about MRI scheduled for next Friday. Gave him day/time/location and he is agreeable.

## 2021-06-04 NOTE — Patient Instructions (Signed)
[facility]  Discharge Instructions: Thank you for choosing [facility] to provide your oncology and hematology care.  If you have a lab appointment with the [facility], please go directly to the [facility] and check in at the registration area.  Wear comfortable clothing and clothing appropriate for easy access to any Portacath or PICC line.   We strive to give you quality time with your provider. You may need to reschedule your appointment if you arrive late (15 or more minutes).  Arriving late affects you and other patients whose appointments are after yours.  Also, if you miss three or more appointments without notifying the office, you may be dismissed from the clinic at the provider's discretion.      For prescription refill requests, have your pharmacy contact our office and allow 72 hours for refills to be completed.    Today you received the following chemotherapy and/or immunotherapy agents EVUSHELD, [name], Neulasta      To help prevent nausea and vomiting after your treatment, we encourage you to take your nausea medication as directed.  BELOW ARE SYMPTOMS THAT SHOULD BE REPORTED IMMEDIATELY: *FEVER GREATER THAN 100.4 F (38 C) OR HIGHER *CHILLS OR SWEATING *NAUSEA AND VOMITING THAT IS NOT CONTROLLED WITH YOUR NAUSEA MEDICATION *UNUSUAL SHORTNESS OF BREATH *UNUSUAL BRUISING OR BLEEDING *URINARY PROBLEMS (pain or burning when urinating, or frequent urination) *BOWEL PROBLEMS (unusual diarrhea, constipation, pain near the anus) TENDERNESS IN MOUTH AND THROAT WITH OR WITHOUT PRESENCE OF ULCERS (sore throat, sores in mouth, or a toothache) UNUSUAL RASH, SWELLING OR PAIN  UNUSUAL VAGINAL DISCHARGE OR ITCHING   Items with * indicate a potential emergency and should be followed up as soon as possible or go to the Emergency Department if any problems should occur.  Please show the CHEMOTHERAPY ALERT CARD or IMMUNOTHERAPY ALERT  CARD at check-in to the Emergency Department and triage nurse.  Should you have questions after your visit or need to cancel or reschedule your appointment, please contact [facility]  [phone] and follow the prompts.  Office hours are 8:00 a.m. to 4:30 p.m. Monday - Friday. Please note that voicemails left after 4:00 p.m. may not be returned until the following business day.  We are closed weekends and major holidays. You have access to a nurse at all times for urgent questions. Please call the main number to the clinic [phone] and follow the prompts.  For any non-urgent questions, you may also contact your provider using MyChart. We now offer e-Visits for anyone 72 and older to request care online for non-urgent symptoms. For details visit [email or web address].   Also download the MyChart app! Go to the app store, search "MyChart", open the app, select [facility], and log in with your MyChart username and password.  Due to Covid, a mask is required upon entering the hospital/clinic. If you do not have a mask, one will be given to you upon arrival. For doctor visits, patients may have 1 support person aged 44 or older with them. For treatment visits, patients cannot have anyone with them due to current Covid guidelines and our immunocompromised population.   Pegfilgrastim injection What is this medication? [name] is a long-acting granulocyte colony-stimulating factor that stimulates the growth of neutrophils, a type of white blood cell important in the body's fight against infection. It is used to reduce the incidence of fever and infection in patients with certain types of  cancer who are receiving chemotherapy that affects the bone marrow, and toincrease survival after being exposed to high doses of radiation. This medicine may be used for other purposes; ask your health care provider orpharmacist if you have questions. COMMON  BRAND NAME(S): [name], [name] What should I tell my care team before I take this medication? They need to know if you have any of these conditions: kidney disease latex allergy ongoing radiation therapy sickle cell disease skin reactions to acrylic adhesives (On-Body Injector only) an unusual or allergic reaction to pegfilgrastim, filgrastim, other medicines, foods, dyes, or preservatives pregnant or trying to get pregnant breast-feeding How should I use this medication? This medicine is for injection under the skin. If you get this medicine at home, you will be taught how to prepare and give the pre-filled syringe or how to use the On-body Injector. Refer to the patient Instructions for Use for detailed instructions. Use exactly as directed. Tell your healthcare provider immediately if you suspect that the On-body Injector may not have performed as intended or if you suspect the use of the On-body Injector resulted in a missedor partial dose. It is important that you put your used needles and syringes in a special sharps container. Do not put them in a trash can. If you do not have a sharpscontainer, call your pharmacist or healthcare provider to get one. Talk to your pediatrician regarding the use of this medicine in children. Whilethis drug may be prescribed for selected conditions, precautions do apply. Overdosage: If you think you have taken too much of this medicine contact apoison control center or emergency room at once. NOTE: This medicine is only for you. Do not share this medicine with others. What if I miss a dose? It is important not to miss your dose. Call your doctor or health care professional if you miss your dose. If you miss a dose due to an On-body Injector failure or leakage, a new dose should be administered as soon aspossible using a single prefilled syringe for manual use. What may interact with this medication? Interactions have not been  studied. This list may not describe all possible interactions. Give your health care provider a list of all the medicines, herbs, non-prescription drugs, or dietary supplements you use. Also tell them if you smoke, drink alcohol, or use illegaldrugs. Some items may interact with your medicine. What should I watch for while using this medication? Your condition will be monitored carefully while you are receiving thismedicine. You may need blood work done while you are taking this medicine. Talk to your health care provider about your risk of cancer. You may be more atrisk for certain types of cancer if you take this medicine. If you are going to need a MRI, CT scan, or other procedure, tell your doctorthat you are using this medicine (On-Body Injector only). What side effects may I notice from receiving this medication? Side effects that you should report to your doctor or health care professionalas soon as possible: allergic reactions (skin rash, itching or hives, swelling of the face, lips, or tongue) back pain dizziness fever pain, redness, or irritation at site where injected pinpoint red spots on the skin red or dark-brown urine shortness of breath or breathing problems stomach or side pain, or pain at the shoulder swelling tiredness trouble passing urine or change in the amount of urine unusual bruising or bleeding Side effects that usually do not require medical attention (report to yourdoctor or health care professional  if they continue or are bothersome): bone pain muscle pain This list may not describe all possible side effects. Call your doctor for medical advice about side effects. You may report side effects to FDA at1-800-FDA-1088. Where should I keep my medication? Keep out of the reach of children. If you are using this medicine at home, you will be instructed on how to storeit. Throw away any unused medicine after the expiration date on the label. NOTE: This sheet is a  summary. It may not cover all possible information. If you have questions about this medicine, talk to your doctor, pharmacist, orhealth care provider.  2022 Elsevier/Gold Standard (2021-01-08 11:54:14)   Pemetrexed injection What is this medication? PEMETREXED (PEM e TREX ed) is a chemotherapy drug used to treat lung cancers like non-small cell lung cancer and mesothelioma. It may also be used to treatother cancers. This medicine may be used for other purposes; ask your health care provider orpharmacist if you have questions. COMMON BRAND NAME(S): [name] What should I tell my care team before I take this medication? They need to know if you have any of these conditions: infection (especially a virus infection such as chickenpox, cold sores, or herpes) kidney disease low blood counts, like low white cell, platelet, or red cell counts lung or breathing disease, like asthma radiation therapy an unusual or allergic reaction to pemetrexed, other medicines, foods, dyes, or preservative pregnant or trying to get pregnant breast-feeding How should I use this medication? This drug is given as an infusion into a vein. It is administered in a hospitalor clinic by a specially trained health care professional. Talk to your pediatrician regarding the use of this medicine in children.Special care may be needed. Overdosage: If you think you have taken too much of this medicine contact apoison control center or emergency room at once. NOTE: This medicine is only for you. Do not share this medicine with others. What if I miss a dose? It is important not to miss your dose. Call your doctor or health careprofessional if you are unable to keep an appointment. What may interact with this medication? This medicine may interact with the following medications: Ibuprofen This list may not describe all possible interactions. Give your health care provider a list of all the medicines, herbs, non-prescription  drugs, or dietary supplements you use. Also tell them if you smoke, drink alcohol, or use illegaldrugs. Some items may interact with your medicine. What should I watch for while using this medication? Visit your doctor for checks on your progress. This drug may make you feel generally unwell. This is not uncommon, as chemotherapy can affect healthy cells as well as cancer cells. Report any side effects. Continue your course oftreatment even though you feel ill unless your doctor tells you to stop. In some cases, you may be given additional medicines to help with side effects.Follow all directions for their use. Call your doctor or health care professional for advice if you get a fever, chills or sore throat, or other symptoms of a cold or flu. Do not treat yourself. This drug decreases your body's ability to fight infections. Try toavoid being around people who are sick. This medicine may increase your risk to bruise or bleed. Call your doctor orhealth care professional if you notice any unusual bleeding. Be careful brushing and flossing your teeth or using a toothpick because you may get an infection or bleed more easily. If you have any dental work done,tell your dentist you are receiving this  medicine. Avoid taking products that contain aspirin, acetaminophen, ibuprofen, naproxen, or ketoprofen unless instructed by your doctor. These medicines may hide afever. Call your doctor or health care professional if you get diarrhea or mouthsores. Do not treat yourself. To protect your kidneys, drink water or other fluids as directed while you aretaking this medicine. Do not become pregnant while taking this medicine or for 6 months after stopping it. Women should inform their doctor if they wish to become pregnant or think they might be pregnant. Men should not father a child while taking this medicine and for 3 months after stopping it. This may interfere with the ability to father a child. You should talk to  your doctor or health care professional if you are concerned about your fertility. There is a potential for serious side effects to an unborn child. Talk to your health care professional or pharmacist for more information. Do not breast-feed an infantwhile taking this medicine or for 1 week after stopping it. What side effects may I notice from receiving this medication? Side effects that you should report to your doctor or health care professionalas soon as possible: allergic reactions like skin rash, itching or hives, swelling of the face, lips, or tongue breathing problems redness, blistering, peeling or loosening of the skin, including inside the mouth signs and symptoms of bleeding such as bloody or black, tarry stools; red or dark-brown urine; spitting up blood or brown material that looks like coffee grounds; red spots on the skin; unusual bruising or bleeding from the eye, gums, or nose signs and symptoms of infection like fever or chills; cough; sore throat; pain or trouble passing urine signs and symptoms of kidney injury like trouble passing urine or change in the amount of urine signs and symptoms of liver injury like dark yellow or brown urine; general ill feeling or flu-like symptoms; light-colored stools; loss of appetite; nausea; right upper belly pain; unusually weak or tired; yellowing of the eyes or skin Side effects that usually do not require medical attention (report to yourdoctor or health care professional if they continue or are bothersome): constipation mouth sores nausea, vomiting unusually weak or tired This list may not describe all possible side effects. Call your doctor for medical advice about side effects. You may report side effects to FDA at1-800-FDA-1088. Where should I keep my medication? This drug is given in a hospital or clinic and will not be stored at home. NOTE: This sheet is a summary. It may not cover all possible information. If you have questions about  this medicine, talk to your doctor, pharmacist, orhealth care provider.  2022 Elsevier/Gold Standard (2018-01-31 16:11:33)

## 2021-06-04 NOTE — Progress Notes (Signed)
Hematology/Oncology Consult note [facility]  [phone] [phone]  Patient Care Team: Default, Provider, MD as PCP - General Telford Nab, RN as Registered Nurse Sindy Guadeloupe, MD as Consulting Physician (Hematology and Oncology) Sindy Guadeloupe, MD as Consulting Physician (Hematology and Oncology)   Name of the patient: Kyle Guerrero  [id number]  03-12-1971   Date of visit: 06/04/21  Diagnosis- Non-small cell lung cancer stage IV acT2 cN2 cM1 a with pleural involvement   Chief complaint/ Reason for visit-on treatment assessment prior to cycle 13 maintenance [name].   Heme/Onc history: patient is a 50 year old male who presented to the ER with symptoms of heaviness in his chest and upper left chest discomfort he underwent CT angio chest to rule out PE which showed 3.8 x 3.3 cm left upper palpable lung mass along with 4.4 x 3.3 cm lobulated mass in the aortopulmonary window and 2.6 x 2.4 cm left hilar mass all concerning for malignancy.  Patient has also seen pulmonary and has been set up for bronchoscopy and EBUS guided biopsy on 11/23/2019.  Patient underwent.  PET CT scan which showed a hypermetabolic spiculated 3.5 cm of 5 left upper lobe lung mass, adjacent hypermetabolic 3.2 x 1.2 cm pleural metastases in the medial posterior by the left pleural space along with scalloping of the adjacent posterior left third rib.  Hypermetabolic infiltrative left perihilar conglomerate nodal metastases measuring up to 7.3 x 3.6 cm and 0.8 cm high left mediastinal node between the left brachiocephalic vein and left subclavian artery.  No evidence of distant metastatic disease   Biopsy showed non-small cell lung cancer but further characterization could not be determined.  Insufficient tissue for NGS testing. Repeat biopsy done. Results of NGS testing showed PD-L1 50%.  Tumor mutational burden high.  ERBB2 copy number again. 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    Patient completed concurrent chemoradiation with carbotaxol chemotherapy followed by 2 cycles of carbotaxol Keytruda and was on maintenance Keytruda   Patient was complaining of left shoulder pain and underwent MRI of the shoulder which showed 8 in the supraspinatus tendon.  He was subsequently seen by [facility] and underwent MRI of the cervical spine which showed a 4.6 x 4.8 x 8.2 cm mass in the left prevertebral region from C2-C7 levels.  The mass partially encases the left vertebral artery and abuts the preforaminal segment.  Invades the vertebral bodies and edema of the left C5 articular pillar.  There is slight extension into the left C4-C5 thecal sac without central thecal sac compression.  The mass displaces the left pharynx anteriorly along the left carotid sheath.  Patient had a repeat biopsy which was consistent with adenocarcinoma.   Patient underwent palliative radiation treatment to his neck mass and completed 4 cycles of carboplatin and [name] with excellent response to treatment and is currently on maintenance [name].  Repeat NGS testing was also performed which did not show any evidence of actionable mutations   Interval history-patient reports worsening of his left shoulder today.  Approximately 2 months ago he was able to lift weights and has slowly seen a decrease in weakness of his left arm and increase in constant sore/tender feeling.  Reports it is worse when it is elevated. CT soft tissue neck approximately 1 month ago was stable with no acute or worsening disease burden.  Left arm discomfort is causing him insomnia.  He has been taking his oxycodone fairly consistently throughout the  day with some symptom relief.  He needs a refill today.  He denies any injury to that left arm.  He has not been able to work out for over 1 month.  Feels a decrease in his hand strength.  ECOG PS- 0 Pain scale- 0   Review of systems- Review of Systems   Constitutional:  Negative for chills, fever, malaise/fatigue and weight loss.  HENT:  Negative for congestion, ear discharge and nosebleeds.   Eyes:  Negative for blurred vision.  Respiratory:  Negative for cough, hemoptysis, sputum production, shortness of breath and wheezing.   Cardiovascular:  Negative for chest pain, palpitations, orthopnea and claudication.  Gastrointestinal:  Negative for abdominal pain, blood in stool, constipation, diarrhea, heartburn, melena, nausea and vomiting.  Genitourinary:  Negative for dysuria, flank pain, frequency, hematuria and urgency.  Musculoskeletal:  Positive for joint pain (Left shoulder). Negative for back pain and myalgias.  Skin:  Negative for rash.  Neurological:  Negative for dizziness, tingling, focal weakness, seizures, weakness and headaches.  Endo/Heme/Allergies:  Does not bruise/bleed easily.  Psychiatric/Behavioral:  Negative for depression and suicidal ideas. The patient does not have insomnia.       No Known Allergies   Past Medical History:  Diagnosis Date   Asthma    Lung cancer ([place])      Past Surgical History:  Procedure Laterality Date   CYST EXCISION     IR IMAGING GUIDED PORT INSERTION  12/05/2019   VIDEO BRONCHOSCOPY WITH ENDOBRONCHIAL ULTRASOUND Left 11/22/2019   Procedure: VIDEO BRONCHOSCOPY WITH ENDOBRONCHIAL ULTRASOUND;  Surgeon: Tyler Pita, MD;  Location: [facility] ORS;  Service: Thoracic;  Laterality: Left;    Social History   Socioeconomic History   Marital status: Single    Spouse name: Not on file   Number of children: Not on file   Years of education: Not on file   Highest education level: Not on file  Occupational History   Not on file  Tobacco Use   Smoking status: Former    Packs/day: 2.00    Pack years: 0.00    Types: Cigarettes    Quit date: 11/08/2019    Years since quitting: 1.5   Smokeless tobacco: Never  Vaping Use   Vaping Use: Never used  Substance and Sexual Activity    Alcohol use: Not Currently   Drug use: Yes    Types: Marijuana   Sexual activity: Not on file  Other Topics Concern   Not on file  Social History Narrative   Not on file   Social Determinants of Health   Financial Resource Strain: Not on file  Food Insecurity: Not on file  Transportation Needs: Not on file  Physical Activity: Not on file  Stress: Not on file  Social Connections: Not on file  Intimate Partner Violence: Not on file    Family History  Problem Relation Age of Onset   Healthy Mother    Healthy Father      Current Outpatient Medications:    baclofen (LIORESAL) 10 MG tablet, Take 1 tablet (10 mg total) by mouth 3 (three) times daily., Disp: 30 each, Rfl: 0   dexamethasone (DECADRON) 4 MG tablet, Take 1 tablet (4 mg total) by mouth 3 (three) times daily., Disp: 30 tablet, Rfl: 0   folic acid (FOLVITE) 1 MG tablet, Take 1 tablet (1 mg total) by mouth daily. Start 5-7 days before [name] chemotherapy. Continue until 21 days after [name] completed., Disp: 100 tablet, Rfl: 3   loratadine (  CLARITIN) 10 MG tablet, Take 10 mg by mouth as directed. 1 tablet starting with each chemo for 1 week, Disp: , Rfl:    ondansetron (ZOFRAN) 8 MG tablet, Take 1 tablet (8 mg total) by mouth 2 (two) times daily as needed (Nausea or vomiting). Start if needed on the third day after chemotherapy., Disp: 30 tablet, Rfl: 1   oxyCODONE (OXY IR/ROXICODONE) 5 MG immediate release tablet, Take 1 tablet (5 mg total) by mouth every 4 (four) hours as needed for moderate pain or severe pain., Disp: 180 tablet, Rfl: 0 No current facility-administered medications for this visit.  Facility-Administered Medications Ordered in Other Visits:    heparin lock flush 100 unit/mL, 500 Units, Intravenous, Once, Sindy Guadeloupe, MD   heparin lock flush 100 unit/mL, 500 Units, Intravenous, Once, Sindy Guadeloupe, MD   sodium chloride flush (NS) 0.9 % injection 10 mL, 10 mL, Intravenous, PRN, Sindy Guadeloupe, MD, 10 mL at  05/01/20 0900   sodium chloride flush (NS) 0.9 % injection 10 mL, 10 mL, Intravenous, PRN, Sindy Guadeloupe, MD, 10 mL at 01/29/21 0835  Physical exam:  Vitals:   06/04/21 1017  BP: (!) 145/103  Pulse: 99  Resp: 20  Temp: 98.9 F (37.2 C)  TempSrc: Tympanic  SpO2: 100%  Weight: 173 lb 9.6 oz (78.7 kg)   Physical Exam HENT:     Head: Normocephalic and atraumatic.  Eyes:     Pupils: Pupils are equal, round, and reactive to light.  Cardiovascular:     Rate and Rhythm: Normal rate and regular rhythm.     Heart sounds: Normal heart sounds.  Pulmonary:     Effort: Pulmonary effort is normal.     Breath sounds: Normal breath sounds.  Abdominal:     General: Bowel sounds are normal.     Palpations: Abdomen is soft.  Musculoskeletal:       Arms:     Cervical back: Normal range of motion.  Skin:    General: Skin is warm and dry.  Neurological:     Mental Status: He is alert and oriented to person, place, and time.     CMP Latest Ref Rng & Units 06/04/2021  Glucose 70 - 99 mg/dL 115(H)  BUN 6 - 20 mg/dL 14  Creatinine 0.61 - 1.24 mg/dL 1.31(H)  Sodium 135 - 145 mmol/L 133(L)  Potassium 3.5 - 5.1 mmol/L 3.6  Chloride 98 - 111 mmol/L 98  CO2 22 - 32 mmol/L 24  Calcium 8.9 - 10.3 mg/dL 9.2  Total Protein 6.5 - 8.1 g/dL 8.2(H)  Total Bilirubin 0.3 - 1.2 mg/dL 0.5  Alkaline Phos 38 - 126 U/L 119  AST 15 - 41 U/L 20  ALT 0 - 44 U/L 17   CBC Latest Ref Rng & Units 06/04/2021  WBC 4.0 - 10.5 K/uL 5.6  Hemoglobin 13.0 - 17.0 g/dL 12.6(L)  Hematocrit 39.0 - 52.0 % 37.1(L)  Platelets 150 - 400 K/uL 426(H)    No images are attached to the encounter.  No results found.    Assessment and plan- Patient is a 50 y.o. male with metastatic adenocarcinoma of the lung with pleural metastases then soft tissue involvement of the neck as well as cervical vertebrae.  He is here for on treatment assessment prior to cycle 13 of maintenance [name]  Counts okay to proceed with cycle 13 of  maintenance [name] today.  He will be also getting Neulasta as he has significant chemo induced neutropenia without  that.  Continue dose reduction of [name].  Return to clinic in 3 weeks (06/25/21) for lab work, Dr. Janese Banks and cycle 14 of [name].  Left shoulder pain-has history of C2-C7 metastasis and he is status post concurrent XRT with carbo/[name] back in September 2021 with significant improvement of his pain and range of motion.  Started having some soreness/pain approximately 1 month ago and had a CT soft tissue neck with contrast which showed progression of metastasis.  He denies injury.  Spoke with Dr. Rogue Bussing who recommends dexamethasone 3 times daily x10 days and MRI of cervical spine.  I will also refill his oxycodone.   AKI: Possibly related to [name] per Dr. Janese Banks.  Creatinine is 1.31 which is stable.  Patient will continue oral folate and continue with every other month B12 injections.    Visit Diagnosis 1. Weakness of left upper extremity   2. Malignant neoplasm of upper lobe of left lung (HCC)    Greater than 50% was spent in counseling and coordination of care with this patient including but not limited to discussion of the relevant topics above (See A&P) including, but not limited to diagnosis and management of acute and chronic medical conditions.    Faythe Casa, NP 06/04/2021 12:55 PM

## 2021-06-08 ENCOUNTER — Encounter: Payer: Self-pay | Admitting: Oncology

## 2021-06-09 ENCOUNTER — Institutional Professional Consult (permissible substitution): Payer: Self-pay | Admitting: Radiation Oncology

## 2021-06-11 ENCOUNTER — Other Ambulatory Visit: Payer: Self-pay

## 2021-06-11 ENCOUNTER — Ambulatory Visit
Admission: RE | Admit: 2021-06-11 | Discharge: 2021-06-11 | Disposition: A | Discharge status: Home / Self Care | Payer: Medicaid Other | Source: Ambulatory Visit | Attending: Oncology | Admitting: Oncology

## 2021-06-11 DIAGNOSIS — R29898 Other symptoms and signs involving the musculoskeletal system: Secondary | ICD-10-CM | POA: Insufficient documentation

## 2021-06-11 IMAGING — MR MR CERVICAL SPINE WO/W CM
8 series · 42 of 48 positions shown · IV contrast (gadavist)
Comparison: CT neck [DATE]
COMPARISON: CT neck [DATE]

Addendum:
CLINICAL DATA: Left upper extremity weakness. Personal history of
lung cancer. Osseous metastasis to the cervical spine.

EXAM:
MRI CERVICAL SPINE WITHOUT AND WITH CONTRAST
TECHNIQUE: Multiplanar and multiecho pulse sequences of the cervical spine, to
include the craniocervical junction and cervicothoracic junction,
were obtained without and with intravenous contrast.
CONTRAST:  7mL GADAVIST GADOBUTROL 1 MMOL/ML IV SOLN

[Series 2: T2 · sagittal · 3.0mm · 0.69mm/px · 5 of 17 slices shown (1 of 2)]
[im 1/17]
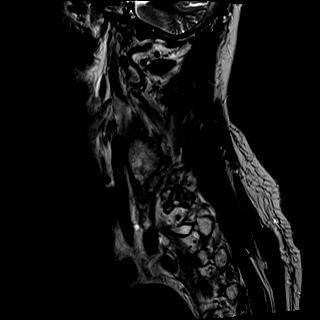
[im 5/17]
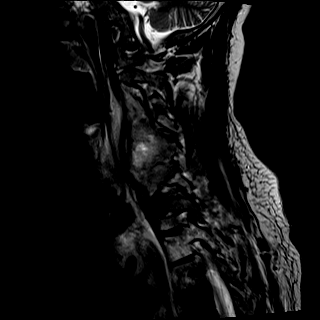
[im 9/17]
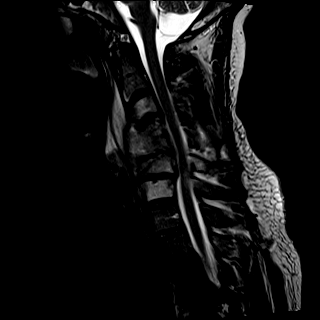
[im 13/17]
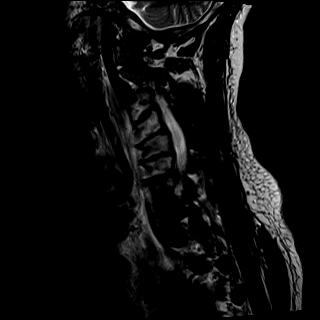
[im 17/17]
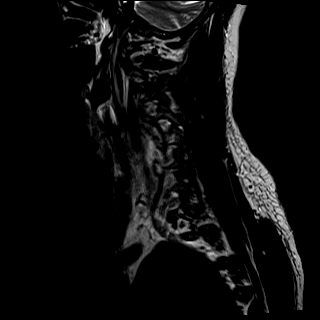

[Series 3: T1 · sagittal · 3.0mm · 0.86mm/px · 5 of 17 slices shown (1 of 2)]
[im 1/17]
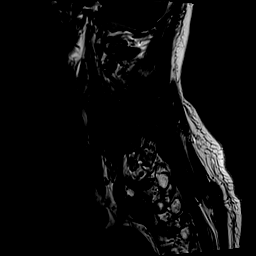
[im 5/17]
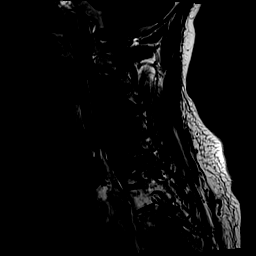
[im 9/17]
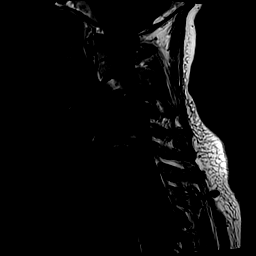
[im 13/17]
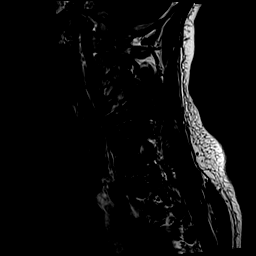
[im 17/17]
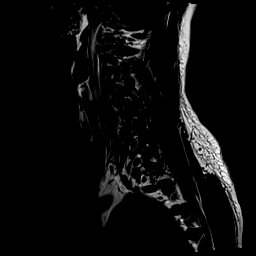

[Series 4: STIR · sagittal · 3.0mm · 0.69mm/px · 5 of 17 slices shown]
[im 1/17]
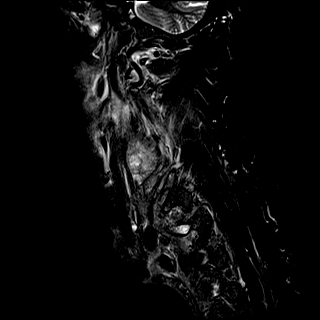
[im 5/17]
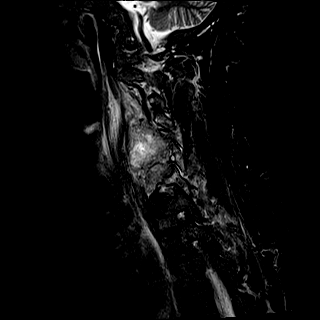
[im 9/17]
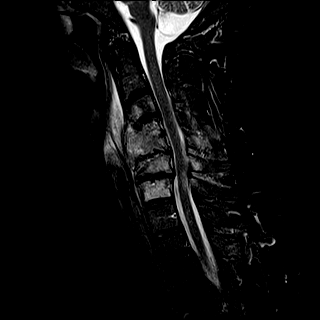
[im 13/17]
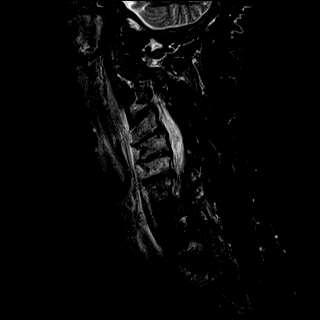
[im 17/17]
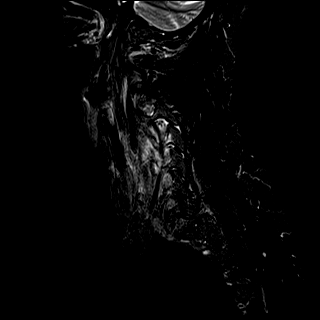

[Series 5: T2 · axial · 3.0mm · 0.62mm/px · z∈[-72,+16]mm · 7 of 25 slices shown (2 of 2)]
[im 1/25]
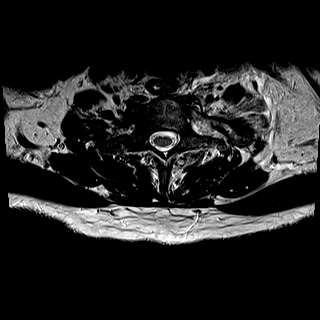
[im 5/25]
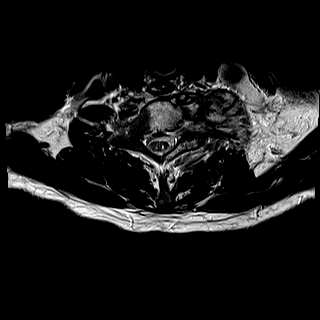
[im 9/25]
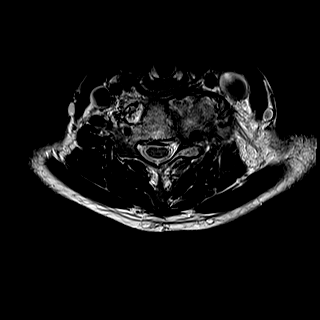
[im 13/25]
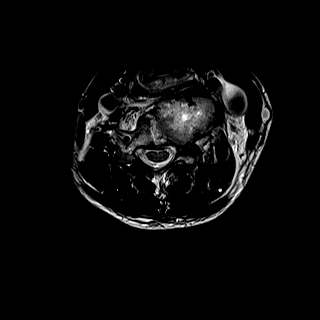
[im 17/25]
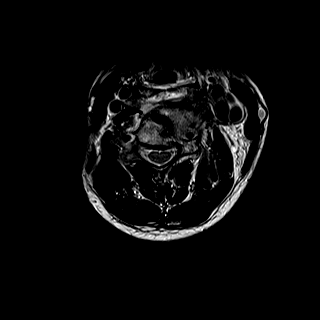
[im 21/25]
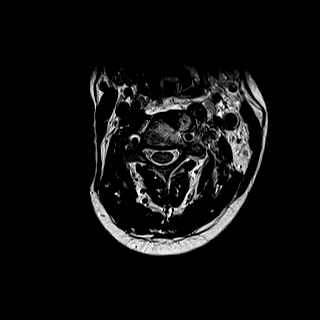
[im 25/25]
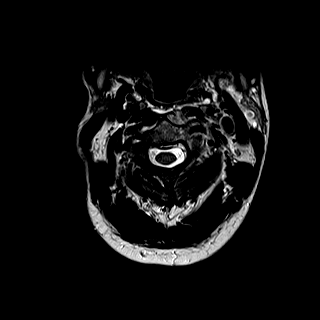

[Series 6: mpgr ax · axial · 3.0mm · 0.35mm/px · 1 of 25 slices shown]
[im 1/25]
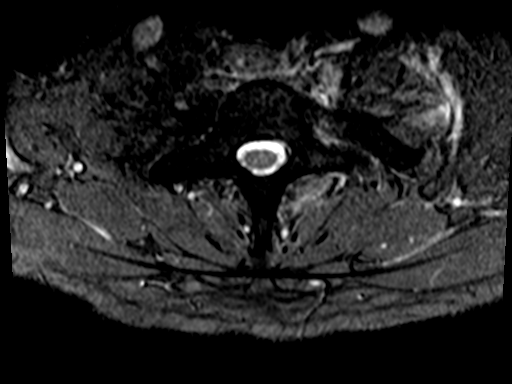

[Series 7: T1 · axial · non-contrast · 3.0mm · 0.78mm/px · z∈[-72,+16]mm · 7 of 25 slices shown (2 of 2)]
[im 1/25]
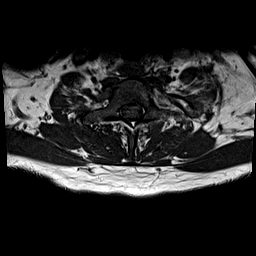
[im 5/25]
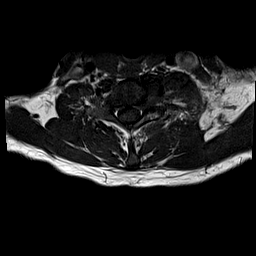
[im 9/25]
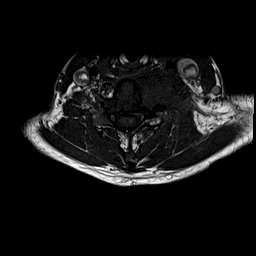
[im 13/25]
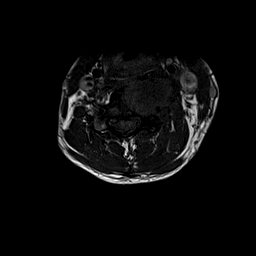
[im 17/25]
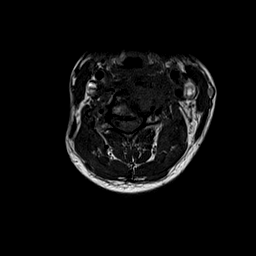
[im 21/25]
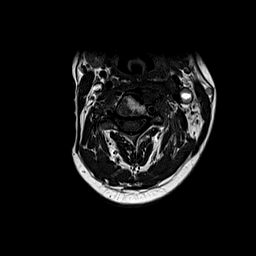
[im 25/25]
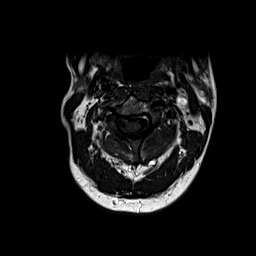

[Series 8: T1 fat-sat post-contrast · sagittal · 3.0mm · 0.69mm/px · 5 of 17 slices shown]
[im 1/17]
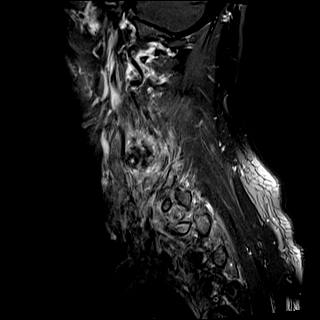
[im 5/17]
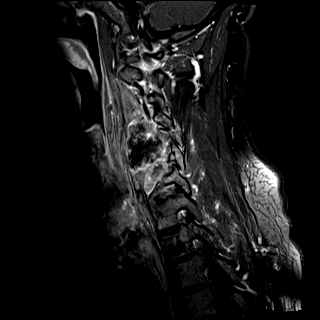
[im 9/17]
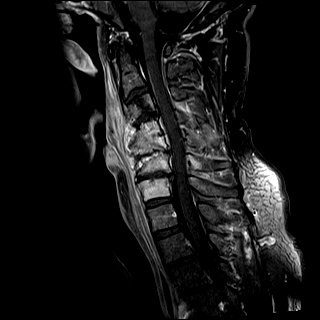
[im 13/17]
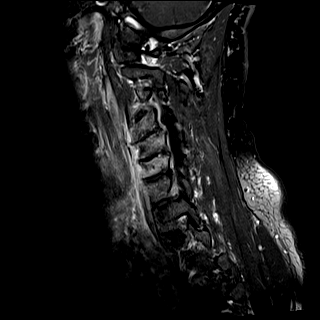
[im 17/17]
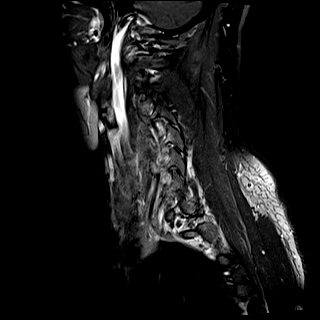

[Series 9: T1 post-contrast · axial · 3.0mm · 0.78mm/px · z∈[-72,+16]mm · 7 of 25 slices shown]
[im 1/25]
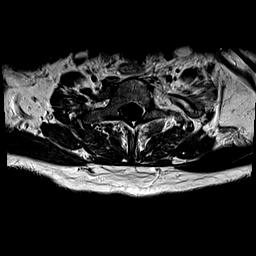
[im 5/25]
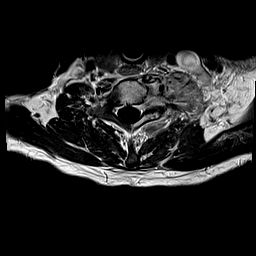
[im 9/25]
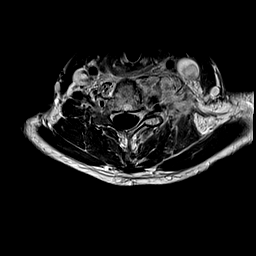
[im 13/25]
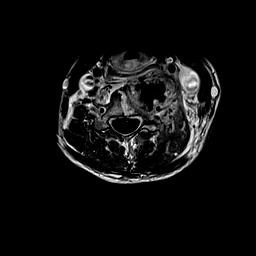
[im 17/25]
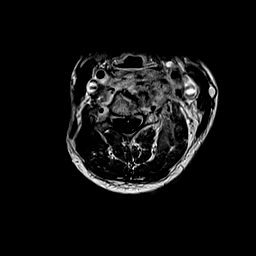
[im 21/25]
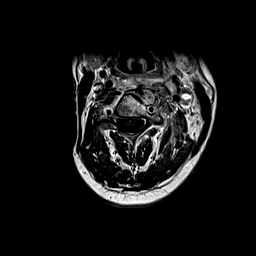
[im 25/25]
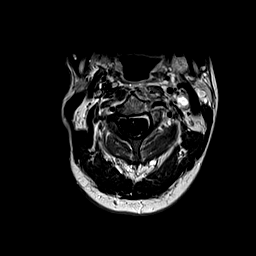

[42 of 48 positions shown; findings below may reference images not displayed]

FINDINGS: Alignment: Slight anterolisthesis is present at C4-5. No other
significant listhesis is present. There is straightening and some
reversal of the normal cervical lordosis. Rightward curvature is
centered at C5-6.

Vertebrae:

A heterogeneously enhancing mass lesion is centered at C5. Tumor
involves the C3, C4, C5 and C6 vertebral bodies. Extensive
extraosseous tumor mass is present. Tumor extends to the left.
Maximal measurements are 4.1 x 2.6 x 5.8 cm. Tumor extends into the
left foramina at C3-4, C4-5, C5-6.

No additional osseous metastases are present.

Hemangioma is noted along the left side of the L2 and L3 vertebral
bodies without associated enhancement. Diffuse marrow edema is noted
from C3 through C7.

Cord: Bilateral T2 hyperintensities are present at C6-7. Cord signal
and morphology is otherwise normal.

Posterior Fossa, vertebral arteries, paraspinal tissues:
Craniocervical junction is normal. Flow is present in the vertebral
arteries bilaterally. Visualized intracranial contents are normal.

Disc levels:

C2-3: Asymmetric right-sided uncovertebral spurring contributes to
mild right foraminal narrowing.

C3-4: A broad-based disc osteophyte complex partially effaces the
ventral CSF. Mild left foraminal narrowing is present.

C4-5: Tumor crosses into the left foramen. Central canal and right
foramen are patent.

C5-6: Tumor encroaches into the left foramen. Broad-based disc
osteophyte complex partially effaces the ventral CSF. The right
foramen is patent.

C6-7: A broad-based disc osteophyte complex is present.
Uncovertebral spurring contributes to moderate foraminal narrowing
bilaterally. Tumor partially encroaches into the right foramen.

C7-T1: Mild facet hypertrophy is present bilaterally without
significant stenosis.
IMPRESSION: 1. Osseous metastases centered at the C5 vertebral body with
extraosseous extension into the left foramina at C3-4, C4-5, and
C5-6 and laterally into the paraspinous soft tissues.
2. Bilateral paramedian cord T2 hyperintensities at C6-7 are typical
of focal cord ischemia.
3. Mild right foraminal narrowing at C2-3.
4. Mild left foraminal narrowing at C3-4.
5. Moderate foraminal narrowing bilaterally at C6-7.

ADDENDUM:
Additional comparison CT is available [DATE]. The lesion at C4-5
has increased in size from that study. At that time, the lesion
measured 2.3 x 3.1 x 2.6 cm.

*** End of Addendum ***
FINDINGS: Alignment: Slight anterolisthesis is present at C4-5. No other
significant listhesis is present. There is straightening and some
reversal of the normal cervical lordosis. Rightward curvature is
centered at C5-6.

Vertebrae:

A heterogeneously enhancing mass lesion is centered at C5. Tumor
involves the C3, C4, C5 and C6 vertebral bodies. Extensive
extraosseous tumor mass is present. Tumor extends to the left.
Maximal measurements are 4.1 x 2.6 x 5.8 cm. Tumor extends into the
left foramina at C3-4, C4-5, C5-6.

No additional osseous metastases are present.

Hemangioma is noted along the left side of the L2 and L3 vertebral
bodies without associated enhancement. Diffuse marrow edema is noted
from C3 through C7.

Cord: Bilateral T2 hyperintensities are present at C6-7. Cord signal
and morphology is otherwise normal.

Posterior Fossa, vertebral arteries, paraspinal tissues:
Craniocervical junction is normal. Flow is present in the vertebral
arteries bilaterally. Visualized intracranial contents are normal.

Disc levels:

C2-3: Asymmetric right-sided uncovertebral spurring contributes to
mild right foraminal narrowing.

C3-4: A broad-based disc osteophyte complex partially effaces the
ventral CSF. Mild left foraminal narrowing is present.

C4-5: Tumor crosses into the left foramen. Central canal and right
foramen are patent.

C5-6: Tumor encroaches into the left foramen. Broad-based disc
osteophyte complex partially effaces the ventral CSF. The right
foramen is patent.

C6-7: A broad-based disc osteophyte complex is present.
Uncovertebral spurring contributes to moderate foraminal narrowing
bilaterally. Tumor partially encroaches into the right foramen.

C7-T1: Mild facet hypertrophy is present bilaterally without
significant stenosis.
IMPRESSION: 1. Osseous metastases centered at the C5 vertebral body with
extraosseous extension into the left foramina at C3-4, C4-5, and
C5-6 and laterally into the paraspinous soft tissues.
2. Bilateral paramedian cord T2 hyperintensities at C6-7 are typical
of focal cord ischemia.
3. Mild right foraminal narrowing at C2-3.
4. Mild left foraminal narrowing at C3-4.
5. Moderate foraminal narrowing bilaterally at C6-7.

## 2021-06-11 MED ORDER — GADOBUTROL 1 MMOL/ML IV SOLN
7.0000 mL | Freq: Once | INTRAVENOUS | Status: AC | PRN
  Administered 2021-06-11: 7 mL via INTRAVENOUS

## 2021-06-17 ENCOUNTER — Encounter: Payer: Self-pay | Admitting: Oncology

## 2021-06-21 ENCOUNTER — Encounter: Payer: Self-pay | Admitting: Oncology

## 2021-06-25 ENCOUNTER — Other Ambulatory Visit: Payer: Self-pay

## 2021-06-25 ENCOUNTER — Telehealth: Payer: Self-pay | Admitting: Oncology

## 2021-06-25 ENCOUNTER — Inpatient Hospital Stay (HOSPITAL_BASED_OUTPATIENT_CLINIC_OR_DEPARTMENT_OTHER): Payer: Medicaid Other | Admitting: Oncology

## 2021-06-25 ENCOUNTER — Inpatient Hospital Stay: Payer: Medicaid Other

## 2021-06-25 ENCOUNTER — Inpatient Hospital Stay: Payer: Medicaid Other | Attending: Oncology

## 2021-06-25 VITALS — BP 137/103 | HR 88 | Temp 96.0°F | Resp 18 | Wt 172.0 lb

## 2021-06-25 DIAGNOSIS — J45909 Unspecified asthma, uncomplicated: Secondary | ICD-10-CM | POA: Insufficient documentation

## 2021-06-25 DIAGNOSIS — G893 Neoplasm related pain (acute) (chronic): Secondary | ICD-10-CM

## 2021-06-25 DIAGNOSIS — Z5111 Encounter for antineoplastic chemotherapy: Secondary | ICD-10-CM

## 2021-06-25 DIAGNOSIS — C3412 Malignant neoplasm of upper lobe, left bronchus or lung: Secondary | ICD-10-CM

## 2021-06-25 DIAGNOSIS — C782 Secondary malignant neoplasm of pleura: Secondary | ICD-10-CM | POA: Diagnosis not present

## 2021-06-25 DIAGNOSIS — M25512 Pain in left shoulder: Secondary | ICD-10-CM | POA: Diagnosis not present

## 2021-06-25 DIAGNOSIS — T451X5A Adverse effect of antineoplastic and immunosuppressive drugs, initial encounter: Secondary | ICD-10-CM

## 2021-06-25 DIAGNOSIS — Z79899 Other long term (current) drug therapy: Secondary | ICD-10-CM | POA: Diagnosis not present

## 2021-06-25 DIAGNOSIS — Z87891 Personal history of nicotine dependence: Secondary | ICD-10-CM | POA: Insufficient documentation

## 2021-06-25 DIAGNOSIS — D701 Agranulocytosis secondary to cancer chemotherapy: Secondary | ICD-10-CM

## 2021-06-25 LAB — CBC WITH DIFFERENTIAL/PLATELET
Abs Immature Granulocytes: 0.02 10*3/uL (ref 0.00–0.07)
Basophils Absolute: 0 10*3/uL (ref 0.0–0.1)
Basophils Relative: 1 %
Eosinophils Absolute: 0.2 10*3/uL (ref 0.0–0.5)
Eosinophils Relative: 3 %
HCT: 36.9 % — ABNORMAL LOW (ref 39.0–52.0)
Hemoglobin: 12.2 g/dL — ABNORMAL LOW (ref 13.0–17.0)
Immature Granulocytes: 0 %
Lymphocytes Relative: 25 %
Lymphs Abs: 1.5 10*3/uL (ref 0.7–4.0)
MCH: 31.3 pg (ref 26.0–34.0)
MCHC: 33.1 g/dL (ref 30.0–36.0)
MCV: 94.6 fL (ref 80.0–100.0)
Monocytes Absolute: 0.9 10*3/uL (ref 0.1–1.0)
Monocytes Relative: 15 %
Neutro Abs: 3.3 10*3/uL (ref 1.7–7.7)
Neutrophils Relative %: 56 %
Platelets: 465 10*3/uL — ABNORMAL HIGH (ref 150–400)
RBC: 3.9 MIL/uL — ABNORMAL LOW (ref 4.22–5.81)
RDW: 14.6 % (ref 11.5–15.5)
WBC: 6 10*3/uL (ref 4.0–10.5)
nRBC: 0 % (ref 0.0–0.2)

## 2021-06-25 LAB — COMPREHENSIVE METABOLIC PANEL
ALT: 16 U/L (ref 0–44)
AST: 18 U/L (ref 15–41)
Albumin: 3.9 g/dL (ref 3.5–5.0)
Alkaline Phosphatase: 104 U/L (ref 38–126)
Anion gap: 8 (ref 5–15)
BUN: 15 mg/dL (ref 6–20)
CO2: 26 mmol/L (ref 22–32)
Calcium: 9.2 mg/dL (ref 8.9–10.3)
Chloride: 99 mmol/L (ref 98–111)
Creatinine, Ser: 1.41 mg/dL — ABNORMAL HIGH (ref 0.61–1.24)
GFR, Estimated: >60 mL/min (ref >60)
Glucose, Bld: 97 mg/dL (ref 70–99)
Potassium: 3.7 mmol/L (ref 3.5–5.1)
Sodium: 133 mmol/L — ABNORMAL LOW (ref 135–145)
Total Bilirubin: 1 mg/dL (ref 0.3–1.2)
Total Protein: 8.3 g/dL — ABNORMAL HIGH (ref 6.5–8.1)

## 2021-06-25 MED ORDER — SODIUM CHLORIDE 0.9 % IV SOLN
375.0000 mg/m2 | Freq: Once | INTRAVENOUS | Status: AC
  Administered 2021-06-25: 750 mg via INTRAVENOUS
  Filled 2021-06-25: qty 20

## 2021-06-25 MED ORDER — HEPARIN SOD (PORK) LOCK FLUSH 100 UNIT/ML IV SOLN
500.0000 [IU] | Freq: Once | INTRAVENOUS | Status: AC
  Administered 2021-06-25: 500 [IU] via INTRAVENOUS
  Filled 2021-06-25: qty 5

## 2021-06-25 MED ORDER — HEPARIN SOD (PORK) LOCK FLUSH 100 UNIT/ML IV SOLN
INTRAVENOUS | Status: AC
  Filled 2021-06-25: qty 5

## 2021-06-25 MED ORDER — PEGFILGRASTIM 6 MG/0.6ML ~~LOC~~ PSKT
6.0000 mg | PREFILLED_SYRINGE | Freq: Once | SUBCUTANEOUS | Status: AC
  Administered 2021-06-25: 6 mg via SUBCUTANEOUS
  Filled 2021-06-25: qty 0.6

## 2021-06-25 MED ORDER — SODIUM CHLORIDE 0.9 % IV SOLN
10.0000 mg | Freq: Once | INTRAVENOUS | Status: AC
  Administered 2021-06-25: 10 mg via INTRAVENOUS
  Filled 2021-06-25: qty 10

## 2021-06-25 MED ORDER — SODIUM CHLORIDE 0.9% FLUSH
10.0000 mL | INTRAVENOUS | Status: DC | PRN
  Administered 2021-06-25: 10 mL via INTRAVENOUS
  Filled 2021-06-25: qty 10

## 2021-06-25 MED ORDER — HEPARIN SOD (PORK) LOCK FLUSH 100 UNIT/ML IV SOLN
500.0000 [IU] | Freq: Once | INTRAVENOUS | Status: DC | PRN
  Filled 2021-06-25: qty 5

## 2021-06-25 MED ORDER — PREGABALIN 75 MG PO CAPS: 75.0000 mg | ORAL_CAPSULE | Freq: Two times a day (BID) | ORAL | 0 refills | Status: DC

## 2021-06-25 MED ORDER — SODIUM CHLORIDE 0.9 % IV SOLN
Freq: Once | INTRAVENOUS | Status: AC
Start: 2021-06-25 — End: 2021-06-25
  Filled 2021-06-25: qty 250

## 2021-06-25 NOTE — Progress Notes (Signed)
Hematology/Oncology Consult note [facility]  Telephone:([phone] [phone]  Patient Care Team: Default, Provider, MD as PCP - General Telford Nab, RN as Registered Nurse Sindy Guadeloupe, MD as Consulting Physician (Hematology and Oncology) Sindy Guadeloupe, MD as Consulting Physician (Hematology and Oncology)   Name of the patient: Kyle Guerrero  [id number]  07-Jul-1971   Date of visit: 06/25/21  Diagnosis-  Non-small cell lung cancer stage IV acT2 cN2 cM1 a with pleural involvement  Chief complaint/ Reason for visit-on treatment assessment prior to cycle 10 of maintenance [name]  Heme/Onc history: patient is a 50 year old male who presented to the ER with symptoms of heaviness in his chest and upper left chest discomfort he underwent CT angio chest to rule out PE which showed 3.8 x 3.3 cm left upper palpable lung mass along with 4.4 x 3.3 cm lobulated mass in the aortopulmonary window and 2.6 x 2.4 cm left hilar mass all concerning for malignancy.  Patient has also seen pulmonary and has been set up for bronchoscopy and EBUS guided biopsy on 11/23/2019.  Patient underwent.  PET CT scan which showed a hypermetabolic spiculated 3.5 cm of 5 left upper lobe lung mass, adjacent hypermetabolic 3.2 x 1.2 cm pleural metastases in the medial posterior by the left pleural space along with scalloping of the adjacent posterior left third rib.  Hypermetabolic infiltrative left perihilar conglomerate nodal metastases measuring up to 7.3 x 3.6 cm and 0.8 cm high left mediastinal node between the left brachiocephalic vein and left subclavian artery.  No evidence of distant metastatic disease   Biopsy showed non-small cell lung cancer but further characterization could not be determined.  Insufficient tissue for NGS testing. Repeat biopsy done. Results of NGS testing showed PD-L1 50%.  Tumor mutational burden high.  ERBB2 copy number again. 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    Patient completed concurrent chemoradiation with carbotaxol chemotherapy followed by 2 cycles of carbotaxol Keytruda and was on maintenance Keytruda   Patient was complaining of left shoulder pain and underwent MRI of the shoulder which showed 8 in the supraspinatus tendon.  He was subsequently seen by [facility] and underwent MRI of the cervical spine which showed a 4.6 x 4.8 x 8.2 cm mass in the left prevertebral region from C2-C7 levels.  The mass partially encases the left vertebral artery and abuts the preforaminal segment.  Invades the vertebral bodies and edema of the left C5 articular pillar.  There is slight extension into the left C4-C5 thecal sac without central thecal sac compression.  The mass displaces the left pharynx anteriorly along the left carotid sheath.  Patient had a repeat biopsy which was consistent with adenocarcinoma.   Patient underwent palliative radiation treatment to his neck mass and completed 4 cycles of carboplatin and [name] with excellent response to treatment and is currently on maintenance [name].  Repeat NGS testing was also performed which did not show any evidence of actionable mutations    Interval history-patient reports that he is able to use his left arm without any significant weakness but he is still having ongoing pain in his left shoulder which is gradually increasing.  He uses 3-4 doses of as needed oxycodone and states that it has been helping and he has enough for now.  ECOG PS- 0 Pain scale- 3 Opioid associated constipation- no  Review of systems- Review of Systems  Constitutional:  Negative for chills, fever, malaise/fatigue and weight loss.  HENT:  Negative for congestion, ear discharge and nosebleeds.   Eyes:  Negative for blurred vision.  Respiratory:  Negative for cough, hemoptysis, sputum production, shortness of breath and wheezing.   Cardiovascular:  Negative for chest pain, palpitations, orthopnea and  claudication.  Gastrointestinal:  Negative for abdominal pain, blood in stool, constipation, diarrhea, heartburn, melena, nausea and vomiting.  Genitourinary:  Negative for dysuria, flank pain, frequency, hematuria and urgency.  Musculoskeletal:  Negative for back pain, joint pain and myalgias.       Left shoulder pain  Skin:  Negative for rash.  Neurological:  Negative for dizziness, tingling, focal weakness, seizures, weakness and headaches.  Endo/Heme/Allergies:  Does not bruise/bleed easily.  Psychiatric/Behavioral:  Negative for depression and suicidal ideas. The patient does not have insomnia.      No Known Allergies   Past Medical History:  Diagnosis Date   Asthma    Lung cancer ([place])      Past Surgical History:  Procedure Laterality Date   CYST EXCISION     IR IMAGING GUIDED PORT INSERTION  12/05/2019   VIDEO BRONCHOSCOPY WITH ENDOBRONCHIAL ULTRASOUND Left 11/22/2019   Procedure: VIDEO BRONCHOSCOPY WITH ENDOBRONCHIAL ULTRASOUND;  Surgeon: Tyler Pita, MD;  Location: [facility] ORS;  Service: Thoracic;  Laterality: Left;    Social History   Socioeconomic History   Marital status: Single    Spouse name: Not on file   Number of children: Not on file   Years of education: Not on file   Highest education level: Not on file  Occupational History   Not on file  Tobacco Use   Smoking status: Former    Packs/day: 2.00    Pack years: 0.00    Types: Cigarettes    Quit date: 11/08/2019    Years since quitting: 1.6   Smokeless tobacco: Never  Vaping Use   Vaping Use: Never used  Substance and Sexual Activity   Alcohol use: Not Currently   Drug use: Yes    Types: Marijuana   Sexual activity: Not on file  Other Topics Concern   Not on file  Social History Narrative   Not on file   Social Determinants of Health   Financial Resource Strain: Not on file  Food Insecurity: Not on file  Transportation Needs: Not on file  Physical Activity: Not on file  Stress: Not  on file  Social Connections: Not on file  Intimate Partner Violence: Not on file    Family History  Problem Relation Age of Onset   Healthy Mother    Healthy Father      Current Outpatient Medications:    baclofen (LIORESAL) 10 MG tablet, Take 1 tablet (10 mg total) by mouth 3 (three) times daily., Disp: 30 each, Rfl: 0   dexamethasone (DECADRON) 4 MG tablet, Take 1 tablet (4 mg total) by mouth 3 (three) times daily., Disp: 30 tablet, Rfl: 0   folic acid (FOLVITE) 1 MG tablet, Take 1 tablet (1 mg total) by mouth daily. Start 5-7 days before [name] chemotherapy. Continue until 21 days after [name] completed., Disp: 100 tablet, Rfl: 3   loratadine (CLARITIN) 10 MG tablet, Take 10 mg by mouth as directed. 1 tablet starting with each chemo for 1 week, Disp: , Rfl:    ondansetron (ZOFRAN) 8 MG tablet, Take 1 tablet (8 mg total) by mouth 2 (two) times daily as needed (Nausea or vomiting). Start if needed on the third day after chemotherapy., Disp: 30 tablet, Rfl: 1  oxyCODONE (OXY IR/ROXICODONE) 5 MG immediate release tablet, Take 1 tablet (5 mg total) by mouth every 4 (four) hours as needed for moderate pain or severe pain., Disp: 180 tablet, Rfl: 0   pregabalin (LYRICA) 75 MG capsule, Take 1 capsule (75 mg total) by mouth 2 (two) times daily., Disp: 60 capsule, Rfl: 0 No current facility-administered medications for this visit.  Facility-Administered Medications Ordered in Other Visits:    heparin lock flush 100 unit/mL, 500 Units, Intravenous, Once, Sindy Guadeloupe, MD   heparin lock flush 100 unit/mL, 500 Units, Intravenous, Once, Sindy Guadeloupe, MD   heparin lock flush 100 unit/mL, 500 Units, Intracatheter, Once PRN, Sindy Guadeloupe, MD   sodium chloride flush (NS) 0.9 % injection 10 mL, 10 mL, Intravenous, PRN, Sindy Guadeloupe, MD, 10 mL at 05/01/20 0900   sodium chloride flush (NS) 0.9 % injection 10 mL, 10 mL, Intravenous, PRN, Sindy Guadeloupe, MD, 10 mL at 01/29/21 0835   sodium chloride  flush (NS) 0.9 % injection 10 mL, 10 mL, Intravenous, PRN, Sindy Guadeloupe, MD, 10 mL at 06/25/21 0939  Physical exam:  Vitals:   06/25/21 0955  BP: (!) 137/103  Pulse: 88  Resp: 18  Temp: (!) 96 F (35.6 C)  TempSrc: Tympanic  SpO2: 100%  Weight: 172 lb (78 kg)   Physical Exam Cardiovascular:     Rate and Rhythm: Normal rate and regular rhythm.     Heart sounds: Normal heart sounds.  Pulmonary:     Effort: Pulmonary effort is normal.     Breath sounds: Normal breath sounds.  Abdominal:     General: Bowel sounds are normal.     Palpations: Abdomen is soft.  Musculoskeletal:     Comments: No limitation in the left arm movement or overhead abduction.  No overt neck swelling noted.  Skin:    General: Skin is warm and dry.  Neurological:     Mental Status: He is alert and oriented to person, place, and time.     CMP Latest Ref Rng & Units 06/25/2021  Glucose 70 - 99 mg/dL 97  BUN 6 - 20 mg/dL 15  Creatinine 0.61 - 1.24 mg/dL 1.41(H)  Sodium 135 - 145 mmol/L 133(L)  Potassium 3.5 - 5.1 mmol/L 3.7  Chloride 98 - 111 mmol/L 99  CO2 22 - 32 mmol/L 26  Calcium 8.9 - 10.3 mg/dL 9.2  Total Protein 6.5 - 8.1 g/dL 8.3(H)  Total Bilirubin 0.3 - 1.2 mg/dL 1.0  Alkaline Phos 38 - 126 U/L 104  AST 15 - 41 U/L 18  ALT 0 - 44 U/L 16   CBC Latest Ref Rng & Units 06/25/2021  WBC 4.0 - 10.5 K/uL 6.0  Hemoglobin 13.0 - 17.0 g/dL 12.2(L)  Hematocrit 39.0 - 52.0 % 36.9(L)  Platelets 150 - 400 K/uL 465(H)    No images are attached to the encounter.  MR Cervical Spine W Wo Contrast  Result Date: 06/12/2021 CLINICAL DATA:  Left upper extremity weakness. Personal history of lung cancer. Osseous metastasis to the cervical spine. EXAM: MRI CERVICAL SPINE WITHOUT AND WITH CONTRAST TECHNIQUE: Multiplanar and multiecho pulse sequences of the cervical spine, to include the craniocervical junction and cervicothoracic junction, were obtained without and with intravenous contrast. CONTRAST:  57m  GADAVIST GADOBUTROL 1 MMOL/ML IV SOLN COMPARISON:  CT neck 02/11/2021 FINDINGS: Alignment: Slight anterolisthesis is present at C4-5. No other significant listhesis is present. There is straightening and some reversal of the normal cervical lordosis.  Rightward curvature is centered at C5-6. Vertebrae: A heterogeneously enhancing mass lesion is centered at C5. Tumor involves the C3, C4, C5 and C6 vertebral bodies. Extensive extraosseous tumor mass is present. Tumor extends to the left. Maximal measurements are 4.1 x 2.6 x 5.8 cm. Tumor extends into the left foramina at C3-4, C4-5, C5-6. No additional osseous metastases are present. Hemangioma is noted along the left side of the L2 and L3 vertebral bodies without associated enhancement. Diffuse marrow edema is noted from C3 through C7. Cord: Bilateral T2 hyperintensities are present at C6-7. Cord signal and morphology is otherwise normal. Posterior Fossa, vertebral arteries, paraspinal tissues: Craniocervical junction is normal. Flow is present in the vertebral arteries bilaterally. Visualized intracranial contents are normal. Disc levels: C2-3: Asymmetric right-sided uncovertebral spurring contributes to mild right foraminal narrowing. C3-4: A broad-based disc osteophyte complex partially effaces the ventral CSF. Mild left foraminal narrowing is present. C4-5: Tumor crosses into the left foramen. Central canal and right foramen are patent. C5-6: Tumor encroaches into the left foramen. Broad-based disc osteophyte complex partially effaces the ventral CSF. The right foramen is patent. C6-7: A broad-based disc osteophyte complex is present. Uncovertebral spurring contributes to moderate foraminal narrowing bilaterally. Tumor partially encroaches into the right foramen. C7-T1: Mild facet hypertrophy is present bilaterally without significant stenosis. IMPRESSION: 1. Osseous metastases centered at the C5 vertebral body with extraosseous extension into the left foramina at  C3-4, C4-5, and C5-6 and laterally into the paraspinous soft tissues. 2. Bilateral paramedian cord T2 hyperintensities at C6-7 are typical of focal cord ischemia. 3. Mild right foraminal narrowing at C2-3. 4. Mild left foraminal narrowing at C3-4. 5. Moderate foraminal narrowing bilaterally at C6-7. Electronically Signed   By: San Morelle M.D.   On: 06/12/2021 14:08     Assessment and plan- Patient is a 50 y.o. male with metastatic adenocarcinoma of the lung with pleural metastases then soft tissue involvement of the neck as well as cervical vertebrae.  He is here for on treatment assessment prior to cycle 9 of maintenance [name]  Counts okay to proceed with cycle 9 of maintenance [name] today.  He is receiving little lower dose of 375 mg per metered squared due to neutropenia as well as mild impairment in his renal function.  His creatinine has been around 1.3-4 over the last 3 months which we will continue to monitor.  He will receive B12 shot today.  I will see him back in 3 weeks for cycle 10.  Neoplasm related pain: I have reviewed MRICervical spine images independently.  This was compared to CT neck in February 2022.  At present there is still residual mass of 4.1 x 2.6 x 5.8 cm which to me appears overall stable but I would like radiology to let me know formally if there is any interval progression between his prior scans in February and May as compared to today.  He has already received palliative radiation to this area.  If there is concern for progression, I will refer him back to neurosurgery Dr. Cari Caraway to see if there would be any role for surgical intervention.  He has seen him in the past.  Other than his neck mass there has been no other evidence of systemic disease.  His primary left apical lung nodule is now down to 12 x 11 mm and there is no other evidence of thoracic nodal or extrathoracic metastases.  I am inclined to hold onto the present systemic therapy until his next scan  and  if there is evidence of overt progression I will consider switching him to third line treatment.  Patient is currently on as needed oxycodone and I will also start him on Lyrica 75 mg twice daily.   Visit Diagnosis 1. Encounter for antineoplastic chemotherapy   2. Malignant neoplasm of upper lobe of left lung ([place])   3. Chemotherapy induced neutropenia ([facility])   4. Neoplasm related pain      Dr. Randa Evens, MD, MPH [facility] [phone] 06/25/2021 12:57 PM

## 2021-06-25 NOTE — Telephone Encounter (Signed)
Patient called and stated that he is going to [place] for a few weeks and leaving on 7/21. He was requested to reschedule his appointments to 7/20. I let patient know that Dr. Janese Banks is in [place] on Wednesdays and he is agreeable to be seen at that location.   Please advise if ok to move treatment day from 7/21 to 7/20.  Thanks

## 2021-06-25 NOTE — Progress Notes (Signed)
Patient here for oncology follow-up appointment, expresses concerns of Left arm pain w/ numbness and elevated BP

## 2021-06-25 NOTE — Patient Instructions (Signed)
[facility]  Discharge Instructions: Thank you for choosing [facility] to provide your oncology and hematology care.  If you have a lab appointment with the [facility], please go directly to the [facility] and check in at the registration area.  Wear comfortable clothing and clothing appropriate for easy access to any Portacath or PICC line.   We strive to give you quality time with your provider. You may need to reschedule your appointment if you arrive late (15 or more minutes).  Arriving late affects you and other patients whose appointments are after yours.  Also, if you miss three or more appointments without notifying the office, you may be dismissed from the clinic at the provider's discretion.      For prescription refill requests, have your pharmacy contact our office and allow 72 hours for refills to be completed.    Today you received the following chemotherapy and/or immunotherapy agents [name]      To help prevent nausea and vomiting after your treatment, we encourage you to take your nausea medication as directed.  BELOW ARE SYMPTOMS THAT SHOULD BE REPORTED IMMEDIATELY: *FEVER GREATER THAN 100.4 F (38 C) OR HIGHER *CHILLS OR SWEATING *NAUSEA AND VOMITING THAT IS NOT CONTROLLED WITH YOUR NAUSEA MEDICATION *UNUSUAL SHORTNESS OF BREATH *UNUSUAL BRUISING OR BLEEDING *URINARY PROBLEMS (pain or burning when urinating, or frequent urination) *BOWEL PROBLEMS (unusual diarrhea, constipation, pain near the anus) TENDERNESS IN MOUTH AND THROAT WITH OR WITHOUT PRESENCE OF ULCERS (sore throat, sores in mouth, or a toothache) UNUSUAL RASH, SWELLING OR PAIN  UNUSUAL VAGINAL DISCHARGE OR ITCHING   Items with * indicate a potential emergency and should be followed up as soon as possible or go to the Emergency Department if any problems should occur.  Please show the CHEMOTHERAPY ALERT CARD or IMMUNOTHERAPY ALERT CARD at check-in to  the Emergency Department and triage nurse.  Should you have questions after your visit or need to cancel or reschedule your appointment, please contact [facility]  [phone] and follow the prompts.  Office hours are 8:00 a.m. to 4:30 p.m. Monday - Friday. Please note that voicemails left after 4:00 p.m. may not be returned until the following business day.  We are closed weekends and major holidays. You have access to a nurse at all times for urgent questions. Please call the main number to the clinic [phone] and follow the prompts.  For any non-urgent questions, you may also contact your provider using MyChart. We now offer e-Visits for anyone 54 and older to request care online for non-urgent symptoms. For details visit [email or web address].   Also download the MyChart app! Go to the app store, search "MyChart", open the app, select [facility], and log in with your MyChart username and password.  Due to Covid, a mask is required upon entering the hospital/clinic. If you do not have a mask, one will be given to you upon arrival. For doctor visits, patients may have 1 support person aged 8 or older with them. For treatment visits, patients cannot have anyone with them due to current Covid guidelines and our immunocompromised population.

## 2021-06-25 NOTE — Progress Notes (Signed)
Per Dr. Janese Banks okay to proceed with treatment with b/p 137/103

## 2021-06-26 NOTE — Telephone Encounter (Signed)
Skip a week instead and put him for 7/29. When is he coming back?

## 2021-06-29 ENCOUNTER — Telehealth: Payer: Self-pay | Admitting: Oncology

## 2021-06-29 NOTE — Telephone Encounter (Signed)
Left VM with patient to notify him of changes to schedule. Patient is going out of town and requested to move his treatment days. Per MD--move appts to 7/29.

## 2021-06-30 ENCOUNTER — Telehealth: Payer: Self-pay | Admitting: Oncology

## 2021-06-30 NOTE — Telephone Encounter (Signed)
Patient returned phone call. He confirmed that he would be back in town for appointment scheduled on 7/29.

## 2021-07-05 ENCOUNTER — Other Ambulatory Visit: Payer: Self-pay | Admitting: *Deleted

## 2021-07-05 ENCOUNTER — Telehealth: Payer: Self-pay | Admitting: *Deleted

## 2021-07-05 MED ORDER — PREGABALIN 75 MG PO CAPS: 75.0000 mg | ORAL_CAPSULE | Freq: Two times a day (BID) | ORAL | 0 refills | Status: DC

## 2021-07-05 NOTE — Telephone Encounter (Signed)
Per Dr. Janese Banks and Dr. Izora Ribas, it is recommended for pt to establish care with [facility] neurosurgical oncology) to discuss intervention for enlarging neck mass. Dr. Izora Ribas will coordinate appt with neurosurgery. Dr. Janese Banks recommends for pt to establish care for second opinion at [facility]. Referral faxed to [facility].   Pt made aware of recommendations and informed to expect call for appts at [facility]. Pt verbalized understanding. Nothing further needed at this time.

## 2021-07-05 NOTE — Telephone Encounter (Signed)
Pt states that does not have prescription drug coverage at this time. Informed pt that can send prescription to [facility] to help pay for medication until able to obtain coverage. Pt verbalized understanding.

## 2021-07-14 ENCOUNTER — Ambulatory Visit: Payer: Medicaid Other | Admitting: Oncology

## 2021-07-14 ENCOUNTER — Ambulatory Visit: Payer: Medicaid Other

## 2021-07-14 ENCOUNTER — Other Ambulatory Visit: Payer: Medicaid Other

## 2021-07-15 ENCOUNTER — Ambulatory Visit: Payer: Medicaid Other | Admitting: Oncology

## 2021-07-15 ENCOUNTER — Other Ambulatory Visit: Payer: Medicaid Other

## 2021-07-15 ENCOUNTER — Telehealth: Payer: Self-pay | Admitting: *Deleted

## 2021-07-15 ENCOUNTER — Ambulatory Visit: Payer: Medicaid Other

## 2021-07-15 DIAGNOSIS — C3490 Malignant neoplasm of unspecified part of unspecified bronchus or lung: Secondary | ICD-10-CM

## 2021-07-15 MED ORDER — PANTOPRAZOLE SODIUM 20 MG PO TBEC: 20.0000 mg | DELAYED_RELEASE_TABLET | Freq: Every day | ORAL | 1 refills | Status: DC

## 2021-07-15 NOTE — Telephone Encounter (Signed)
Pt scheduled for PET on Monday 7/25 at 1:30pm. Instructions given to arrive at 1pm at the [facility], remain NPO 6 hours prior, water only with meds. Pt advised to continue decadron and start protonix 20mg  daily which has been sent into his pharmacy. Reviewed appt to see Dr. Baruch Gouty on Tues 7/26 at 10am to review PET scan and discuss any need for radiation treatment. Pt made aware that may take 1-2 tablets of oxycodone to help with pain at this time as well. Pt verbalized understanding. Nothing further needed at this time.

## 2021-07-15 NOTE — Telephone Encounter (Signed)
Can you work on getting a pet scan for him asap ideally before his next appt? Also he needs to see dr Baruch Gouty asap (I spoke to him). Have him take decadron 4 mg TID with protonix 20 mg. He can increase the dose of his oxycodone in the meanwhile

## 2021-07-15 NOTE — Telephone Encounter (Signed)
Pt called in to report is experiencing left arm weakness and pain. Pt has been taking his pain medication and lyrica as prescribed. Pt stated that he has not been taking decadron very often. Decadron was written to take 4mg  three times a day. Pt states has about #20 tablets left in bottle. Instructed pt to start taking decadron TID at this time which help with his symptoms.   Pt's appt at [facility] is 7/27 with medonc and neurosurgery.

## 2021-07-19 ENCOUNTER — Ambulatory Visit
Admission: RE | Admit: 2021-07-19 | Discharge: 2021-07-19 | Disposition: A | Discharge status: Home / Self Care | Payer: Medicaid Other | Source: Ambulatory Visit | Attending: Oncology | Admitting: Oncology

## 2021-07-19 ENCOUNTER — Other Ambulatory Visit: Payer: Self-pay

## 2021-07-19 DIAGNOSIS — Y929 Unspecified place or not applicable: Secondary | ICD-10-CM | POA: Diagnosis not present

## 2021-07-19 DIAGNOSIS — R599 Enlarged lymph nodes, unspecified: Secondary | ICD-10-CM | POA: Diagnosis not present

## 2021-07-19 DIAGNOSIS — C3490 Malignant neoplasm of unspecified part of unspecified bronchus or lung: Secondary | ICD-10-CM | POA: Diagnosis not present

## 2021-07-19 DIAGNOSIS — J90 Pleural effusion, not elsewhere classified: Secondary | ICD-10-CM | POA: Diagnosis not present

## 2021-07-19 DIAGNOSIS — Y939 Activity, unspecified: Secondary | ICD-10-CM | POA: Insufficient documentation

## 2021-07-19 DIAGNOSIS — S2242XA Multiple fractures of ribs, left side, initial encounter for closed fracture: Secondary | ICD-10-CM | POA: Diagnosis not present

## 2021-07-19 DIAGNOSIS — I700 Atherosclerosis of aorta: Secondary | ICD-10-CM | POA: Insufficient documentation

## 2021-07-19 LAB — GLUCOSE, CAPILLARY: Glucose-Capillary: 93 mg/dL (ref 70–99)

## 2021-07-19 IMAGING — CT NM PET TUM IMG RESTAG (PS) SKULL BASE T - THIGH
1 of 10 series · 1 of 25 positions shown · non-contrast
Comparison: Multiple exams, including [DATE] and CT of
[DATE]

CLINICAL DATA: Subsequent treatment strategy for non-small cell
lung cancer.

EXAM:
NUCLEAR MEDICINE PET SKULL BASE TO THIGH
TECHNIQUE: 9.1 mCi F-18 FDG was injected intravenously. Full-ring PET imaging
was performed from the skull base to thigh after the radiotracer. CT
data was obtained and used for attenuation correction and anatomic
localization.
Fasting blood glucose: 93 mg/dl

[Series 3: ct wb 5.0 b30f · axial · 5.0mm · 0.98mm/px · 1 of 290 slices shown]
[im 290/290  brain]
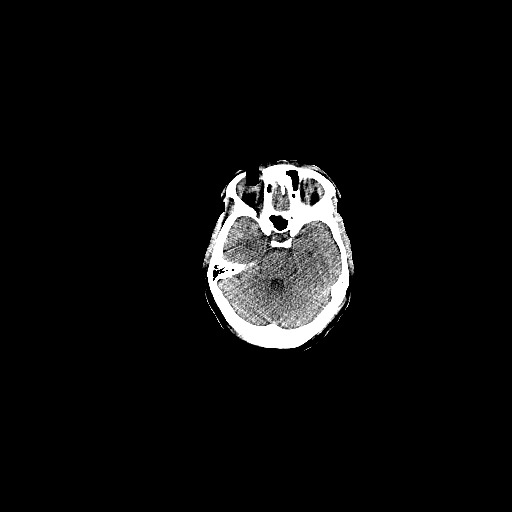

[1 of 25 positions shown; findings below may reference images not displayed]

FINDINGS: Mediastinal blood pool activity: SUV max

Liver activity: SUV max NA

NECK: The invasive mass involving the left paravertebral space with
associated destructive findings of the left C4, C5, and C6 vertebra
is observed with central necrosis and hyperintense margin, maximum
SUV 11.2 (formerly 20.3). The associated abnormal hypermetabolic
activity measures approximately 4.7 by 3.9 by 5.1 cm (volume = 49
cm^3), previously about 200 cubic cm.

Incidental CT findings: none

CHEST: The previously hypermetabolic left supraclavicular lymph node
has resolved. Continued substantial volume loss in the left upper
lobe associated with bandlike regions of atelectasis/scarring and
potentially with a component of radiation fibrosis, extending down
to the left perihilar region. This is not changed from [DATE]
but has evolved compared to [DATE], with only low-grade activity
along the region of presumed radiation therapy related findings and
atelectasis, with a maximum SUV of about

Incidental CT findings: Small left pleural effusion. Right
Port-A-Cath tip: SVC.

ABDOMEN/PELVIS: No significant abnormal hypermetabolic activity in
this region.

Incidental CT findings: Minimal aortoiliac atherosclerotic vascular
disease.

SKELETON: Chronic medial fractures of the left third and fourth
ribs, possibly related to weakening from radiation therapy. No
substantial associated hypermetabolic activity to suggest underlying
neoplastic cause.

Incidental CT findings: none
IMPRESSION: 1. The left paravertebral mass with destructive findings associated
with C4, C5, and C6 demonstrates substantial reduction from the
original PET-CT, currently 49 cubic cm and previously 200 cubic cm.
Activity has also reduced, with evidence of central necrosis and a
current maximum SUV of 11.2, formerly 20.3.
2. Interval resolution of the previous left supraclavicular
hypermetabolic adenopathy.
3. Continued volume loss and bandlike density at the left lung apex
likely therapy related, with only low-grade activity in this
vicinity. Surveillance is likely warranted.
4. Small left pleural effusion is new compared to the prior exam.
5.  Aortic Atherosclerosis ([DN]-[DN]).
6. Chronic medial fractures of the left third and fourth ribs.

## 2021-07-19 MED ORDER — FLUDEOXYGLUCOSE F - 18 (FDG) INJECTION
8.9000 | Freq: Once | INTRAVENOUS | Status: AC | PRN
  Administered 2021-07-19: 9.1 via INTRAVENOUS

## 2021-07-20 ENCOUNTER — Encounter: Payer: Self-pay | Admitting: Radiation Oncology

## 2021-07-20 ENCOUNTER — Inpatient Hospital Stay (HOSPITAL_BASED_OUTPATIENT_CLINIC_OR_DEPARTMENT_OTHER): Payer: Medicaid Other | Admitting: Hospice and Palliative Medicine

## 2021-07-20 ENCOUNTER — Ambulatory Visit
Admission: RE | Admit: 2021-07-20 | Discharge: 2021-07-20 | Disposition: A | Discharge status: Home / Self Care | Payer: Medicaid Other | Source: Ambulatory Visit | Attending: Radiation Oncology | Admitting: Radiation Oncology

## 2021-07-20 VITALS — BP 143/95 | HR 82 | Temp 97.3°F | Wt 174.0 lb

## 2021-07-20 DIAGNOSIS — C3490 Malignant neoplasm of unspecified part of unspecified bronchus or lung: Secondary | ICD-10-CM

## 2021-07-20 DIAGNOSIS — Z515 Encounter for palliative care: Secondary | ICD-10-CM

## 2021-07-20 DIAGNOSIS — G893 Neoplasm related pain (acute) (chronic): Secondary | ICD-10-CM

## 2021-07-20 DIAGNOSIS — Z5111 Encounter for antineoplastic chemotherapy: Secondary | ICD-10-CM | POA: Diagnosis not present

## 2021-07-20 MED ORDER — BACLOFEN 10 MG PO TABS: 10.0000 mg | ORAL_TABLET | Freq: Three times a day (TID) | ORAL | 0 refills | Status: DC

## 2021-07-20 NOTE — Progress Notes (Signed)
Radiation Oncology Follow up Note old patient new area persistent cervical spine metastasis  Name: Kyle Guerrero   Date:   07/20/2021 MRN:  [id number] DOB: 11-12-1971    This 50 y.o. male presents to the clinic today for reevaluation of cervical metastatic disease and patient with known stage IV non-small cell lung cancer previously treated to this area for large metastatic lesion.  REFERRING PROVIDER: No ref. provider found  HPI: Patient is a 50 year old male well-known to our department who is previously received radiation therapy to his cervical spine for large metastatic deposit of stage IV non-small cell lung cancer with limitation of motor function in his left upper extremity and pain.Marland Kitchen  He initially had an excellent response palliatively to treatment..  He has been on maintenance [name] which he is also tolerated well.  His initial mass was 8.2 cm in greatest dimension.  The mass encased the left vertebral artery and abuts the preforaminal segment.  There is a slight extension to the C6 for C5 thecal sac.  Repeat MRI scan in June showed osseous metastatic disease centered on the C5 vertebral body with x-ray also extension into the left foramina and paraspinous soft tissue.  He had a PET scan yesterday which has not been formally read but still shows hypermetabolic residual disease in the region of the C5 vertebral body.  He is seen today for consideration of further palliative treatment.  He is having no dysphagia at this time no other areas of significant pain.  COMPLICATIONS OF TREATMENT: none  FOLLOW UP COMPLIANCE: keeps appointments   PHYSICAL EXAM:  BP (!) 143/95   Pulse 82   Temp (!) 97.3 F (36.3 C) (Tympanic)   Wt 174 lb (78.9 kg)   SpO2 99%   BMI 24.27 kg/m  Range of motion of his left upper extremity does not elicit pain motor or sensory levels are equal and symmetric in the upper extremities.  Well-developed well-nourished patient in NAD. HEENT reveals PERLA, EOMI,  discs not visualized.  Oral cavity is clear. No oral mucosal lesions are identified. Neck is clear without evidence of cervical or supraclavicular adenopathy. Lungs are clear to A&P. Cardiac examination is essentially unremarkable with regular rate and rhythm without murmur rub or thrill. Abdomen is benign with no organomegaly or masses noted. Motor sensory and DTR levels are equal and symmetric in the upper and lower extremities. Cranial nerves II through XII are grossly intact. Proprioception is intact. No peripheral adenopathy or edema is identified. No motor or sensory levels are noted. Crude visual fields are within normal range.  RADIOLOGY RESULTS: MRI scans and PET CT scans reviewed compatible with above-stated findings  PLAN: At this time elect to go ahead with another 30 Gray in 5 fractions using SBRT.  We will try her best to spare the spinal cord is much as possible.  I will use PET fusion study fused with our CT simulation films.  Risks and benefits of treatment including further radiation side effect such as skin reaction fatigue possible dysphagia from radiation esophagitis all were discussed in detail with the patient.  Have set him up for treatment planning later this week he has an appointment at [facility] tomorrow with both medical oncology as well as neurosurgery.  Depending on the recommendations may change our treatment plan.  I would like to take this opportunity to thank you for allowing me to participate in the care of your patient.Noreene Filbert, MD

## 2021-07-20 NOTE — Progress Notes (Signed)
[facility]  [phone] [phone]   Name: Kyle Guerrero Date: 07/20/2021 MRN: [id number]  DOB: [birth year]  Patient Care Team: Default, Provider, MD as PCP - General Telford Nab, RN as Registered Nurse Sindy Guadeloupe, MD as Consulting Physician (Hematology and Oncology) Sindy Guadeloupe, MD as Consulting Physician (Hematology and Oncology)    REASON FOR CONSULTATION: Kyle Guerrero is a 50 y.o. male with multiple medical problems including stage IV non-small cell lung cancer with pleural involvement status post concurrent chemoradiation followed by maintenance immunotherapy.  Patient has history of chronic left-sided shoulder pain.  MRI of the shoulder and cervical spine revealed a mass in the supraspinatus tendon and left prevertebral region from C2-C7.  Patient underwent palliative XRT to his neck mass.  However, he has continued to have significant pain.  He is referred to palliative care to help address goals and manage ongoing symptoms.  SOCIAL HISTORY:     reports that he quit smoking about 20 months ago. His smoking use included cigarettes. He smoked an average of 2 packs per day. He has never used smokeless tobacco. He reports previous alcohol use. He reports current drug use. Drug: Marijuana.  ADVANCE DIRECTIVES:  None on file  CODE STATUS:   PAST MEDICAL HISTORY: Past Medical History:  Diagnosis Date   Asthma    Lung cancer ([place])     PAST SURGICAL HISTORY:  Past Surgical History:  Procedure Laterality Date   CYST EXCISION     IR IMAGING GUIDED PORT INSERTION  12/05/2019   VIDEO BRONCHOSCOPY WITH ENDOBRONCHIAL ULTRASOUND Left 11/22/2019   Procedure: VIDEO BRONCHOSCOPY WITH ENDOBRONCHIAL ULTRASOUND;  Surgeon: Tyler Pita, MD;  Location: [facility] ORS;  Service: Thoracic;  Laterality: Left;    HEMATOLOGY/ONCOLOGY HISTORY:  Oncology History  Malignant neoplasm of upper lobe of left lung  ([place])  11/29/2019 Initial Diagnosis   Malignant neoplasm of upper lobe of left lung (Uncertain)    11/29/2019 Cancer Staging   Staging form: Lung, AJCC 8th Edition - Clinical stage from 11/29/2019: Stage IVA (cT2, cN2, cM1a) - Signed by Sindy Guadeloupe, MD on 11/29/2019    12/03/2019 - 01/16/2020 Chemotherapy   The patient had dexamethasone (DECADRON) 4 MG tablet, 8 mg, Oral, Daily, 1 of 1 cycle, Start date: 11/29/2019, End date: 02/04/2020 palonosetron (ALOXI) injection 0.25 mg, 0.25 mg, Intravenous,  Once, 7 of 7 cycles Administration: 0.25 mg (12/03/2019), 0.25 mg (12/12/2019), 0.25 mg (12/19/2019), 0.25 mg (12/26/2019), 0.25 mg (01/02/2020), 0.25 mg (01/09/2020), 0.25 mg (01/16/2020) CARBOplatin (PARAPLATIN) 260 mg in sodium chloride 0.9 % 250 mL chemo infusion, 260 mg (100 % of original dose 262 mg), Intravenous,  Once, 7 of 7 cycles Dose modification:   (original dose 262 mg, Cycle 1) Administration: 260 mg (12/03/2019), 260 mg (12/12/2019), 290 mg (12/19/2019), 290 mg (12/26/2019), 290 mg (01/02/2020), 290 mg (01/09/2020), 290 mg (01/16/2020) PACLitaxel (TAXOL) 84 mg in sodium chloride 0.9 % 250 mL chemo infusion (</= 47m/m2), 45 mg/m2 = 84 mg, Intravenous,  Once, 7 of 7 cycles Administration: 84 mg (12/03/2019), 84 mg (12/12/2019), 84 mg (12/19/2019), 84 mg (12/26/2019), 84 mg (01/02/2020), 84 mg (01/09/2020), 84 mg (01/16/2020)   for chemotherapy treatment.     02/21/2020 - 07/24/2020 Chemotherapy   The patient had palonosetron (ALOXI) injection 0.25 mg, 0.25 mg, Intravenous,  Once, 2 of 2 cycles Administration: 0.25 mg (02/21/2020), 0.25 mg (03/13/2020) pegfilgrastim (NEULASTA ONPRO KIT) injection 6 mg, 6 mg, Subcutaneous, Once, 2 of  2 cycles Administration: 6 mg (02/21/2020), 6 mg (03/13/2020) CARBOplatin (PARAPLATIN) 750 mg in sodium chloride 0.9 % 250 mL chemo infusion, 750 mg (114 % of original dose 658 mg), Intravenous,  Once, 2 of 2 cycles Dose modification:   (original dose 658 mg, Cycle  1) Administration: 750 mg (02/21/2020), 670 mg (03/13/2020) fosaprepitant (EMEND) 150 mg in sodium chloride 0.9 % 145 mL IVPB, 150 mg, Intravenous,  Once, 2 of 2 cycles Administration: 150 mg (02/21/2020), 150 mg (03/13/2020) PACLitaxel (TAXOL) 342 mg in sodium chloride 0.9 % 500 mL chemo infusion (> $RemoveBef'80mg'ySYJyvqboK$ /m2), 175 mg/m2 = 342 mg (100 % of original dose 175 mg/m2), Intravenous,  Once, 2 of 2 cycles Dose modification: 175 mg/m2 (original dose 175 mg/m2, Cycle 1, Reason: Other (see comments), Comment: cytopenia) Administration: 342 mg (02/21/2020), 342 mg (03/13/2020) pembrolizumab (KEYTRUDA) 200 mg in sodium chloride 0.9 % 50 mL chemo infusion, 200 mg, Intravenous, Once, 8 of 9 cycles Administration: 200 mg (02/21/2020), 200 mg (04/10/2020), 200 mg (05/01/2020), 200 mg (03/13/2020), 200 mg (05/22/2020), 200 mg (06/12/2020), 200 mg (07/03/2020), 200 mg (07/24/2020)   for chemotherapy treatment.     08/21/2020 -  Chemotherapy    Patient is on Treatment Plan: LUNG NSCLC PEMETREXED ([name]) / CARBOPLATIN Q21D X 4 CYCLES         ALLERGIES:  has No Known Allergies.  MEDICATIONS:  Current Outpatient Medications  Medication Sig Dispense Refill   baclofen (LIORESAL) 10 MG tablet Take 1 tablet (10 mg total) by mouth 3 (three) times daily. 45 each 0   dexamethasone (DECADRON) 4 MG tablet Take 1 tablet (4 mg total) by mouth 3 (three) times daily. 30 tablet 0   folic acid (FOLVITE) 1 MG tablet Take 1 tablet (1 mg total) by mouth daily. Start 5-7 days before [name] chemotherapy. Continue until 21 days after [name] completed. 100 tablet 3   loratadine (CLARITIN) 10 MG tablet Take 10 mg by mouth as directed. 1 tablet starting with each chemo for 1 week     ondansetron (ZOFRAN) 8 MG tablet Take 1 tablet (8 mg total) by mouth 2 (two) times daily as needed (Nausea or vomiting). Start if needed on the third day after chemotherapy. 30 tablet 1   oxyCODONE (OXY IR/ROXICODONE) 5 MG immediate release tablet Take 1 tablet (5 mg  total) by mouth every 4 (four) hours as needed for moderate pain or severe pain. 180 tablet 0   pantoprazole (PROTONIX) 20 MG tablet Take 1 tablet (20 mg total) by mouth daily. 30 tablet 1   pregabalin (LYRICA) 75 MG capsule Take 1 capsule (75 mg total) by mouth 2 (two) times daily. 60 capsule 0   No current facility-administered medications for this visit.   Facility-Administered Medications Ordered in Other Visits  Medication Dose Route Frequency Provider Last Rate Last Admin   heparin lock flush 100 unit/mL  500 Units Intravenous Once Sindy Guadeloupe, MD       heparin lock flush 100 unit/mL  500 Units Intravenous Once Sindy Guadeloupe, MD       sodium chloride flush (NS) 0.9 % injection 10 mL  10 mL Intravenous PRN Sindy Guadeloupe, MD   10 mL at 05/01/20 0900   sodium chloride flush (NS) 0.9 % injection 10 mL  10 mL Intravenous PRN Sindy Guadeloupe, MD   10 mL at 01/29/21 0835    VITAL SIGNS: There were no vitals taken for this visit. There were no vitals filed for this visit.  Estimated  body mass index is 24.27 kg/m as calculated from the following:   Height as of 05/14/21: _0  (1.803 m).   Weight as of an earlier encounter on 07/20/21: 174 lb (78.9 kg).  LABS: CBC:    Component Value Date/Time   WBC 6.0 06/25/2021 0939   HGB 12.2 (L) 06/25/2021 0939   HCT 36.9 (L) 06/25/2021 0939   PLT 465 (H) 06/25/2021 0939   MCV 94.6 06/25/2021 0939   NEUTROABS 3.3 06/25/2021 0939   LYMPHSABS 1.5 06/25/2021 0939   MONOABS 0.9 06/25/2021 0939   EOSABS 0.2 06/25/2021 0939   BASOSABS 0.0 06/25/2021 0939   Comprehensive Metabolic Panel:    Component Value Date/Time   NA 133 (L) 06/25/2021 0939   K 3.7 06/25/2021 0939   CL 99 06/25/2021 0939   CO2 26 06/25/2021 0939   BUN 15 06/25/2021 0939   CREATININE 1.41 (H) 06/25/2021 0939   GLUCOSE 97 06/25/2021 0939   CALCIUM 9.2 06/25/2021 0939   AST 18 06/25/2021 0939   ALT 16 06/25/2021 0939   ALKPHOS 104 06/25/2021 0939   BILITOT 1.0  06/25/2021 0939   PROT 8.3 (H) 06/25/2021 0939   ALBUMIN 3.9 06/25/2021 0939    RADIOGRAPHIC STUDIES: No results found.  PERFORMANCE STATUS (ECOG) : 1 - Symptomatic but completely ambulatory  Review of Systems Unless otherwise noted, a complete review of systems is negative.  Physical Exam General: NAD Cardiovascular: regular rate and rhythm Pulmonary: clear ant fields Abdomen: soft, nontender, + bowel sounds GU: no suprapubic tenderness Extremities: no edema, no joint deformities Skin: no rashes Neurological: Weakness but otherwise nonfocal  IMPRESSION: I met with patient following his visit with Dr. Baruch Gouty.  Patient has pending appointment tomorrow with [facility] for consideration of surgical resection of neck mass versus further XRT.  Symptomatically, patient endorses a sharp/achy pain in the left shoulder, which radiates down to the elbow.  He is on dexamethasone.  He takes oxycodone on average 3 times a day but does not find it to be particularly helpful.  Patient is also on Lyrica.  Discussed options for adjustment of his pain medications including liberalizing his oxycodone/Lyrica versus starting him on a long-acting opioid.  However, patient says that the most effective medication that has been tried was baclofen.  Patient is no longer taking baclofen as he ran out several weeks ago.  Will refill baclofen today.  Discussed with Hayley, nurse navigator.  Follow-up is TBD depending on outcome of his appointment tomorrow.  We will be happy to follow-up with him in the palliative care clinic and assist with medication management for pain.  PLAN: -Continue current scope of treatment -Continue oxycodone/Lyrica -Restart baclofen 10 mg 3 times daily as needed -Follow-up TBD   Patient expressed understanding and was in agreement with this plan. He also understands that He can call the clinic at any time with any questions, concerns, or complaints.     Time Total: 15  minutes  Visit consisted of counseling and education dealing with the complex and emotionally intense issues of symptom management and palliative care in the setting of serious and potentially life-threatening illness.Greater than 50%  of this time was spent counseling and coordinating care related to the above assessment and plan.  Signed by: Altha Harm, PhD, NP-C

## 2021-07-22 ENCOUNTER — Ambulatory Visit: Payer: Medicaid Other

## 2021-07-23 ENCOUNTER — Inpatient Hospital Stay: Payer: Medicaid Other

## 2021-07-23 ENCOUNTER — Telehealth: Payer: Self-pay | Admitting: *Deleted

## 2021-07-23 ENCOUNTER — Inpatient Hospital Stay (HOSPITAL_BASED_OUTPATIENT_CLINIC_OR_DEPARTMENT_OTHER): Payer: Medicaid Other | Admitting: Oncology

## 2021-07-23 ENCOUNTER — Other Ambulatory Visit: Payer: Self-pay

## 2021-07-23 ENCOUNTER — Encounter: Payer: Self-pay | Admitting: Oncology

## 2021-07-23 DIAGNOSIS — Z5111 Encounter for antineoplastic chemotherapy: Secondary | ICD-10-CM | POA: Diagnosis not present

## 2021-07-23 DIAGNOSIS — D701 Agranulocytosis secondary to cancer chemotherapy: Secondary | ICD-10-CM | POA: Diagnosis not present

## 2021-07-23 DIAGNOSIS — C3412 Malignant neoplasm of upper lobe, left bronchus or lung: Secondary | ICD-10-CM | POA: Diagnosis not present

## 2021-07-23 DIAGNOSIS — T451X5A Adverse effect of antineoplastic and immunosuppressive drugs, initial encounter: Secondary | ICD-10-CM

## 2021-07-23 DIAGNOSIS — G893 Neoplasm related pain (acute) (chronic): Secondary | ICD-10-CM

## 2021-07-23 LAB — COMPREHENSIVE METABOLIC PANEL
ALT: 14 U/L (ref 0–44)
AST: 14 U/L — ABNORMAL LOW (ref 15–41)
Albumin: 3.9 g/dL (ref 3.5–5.0)
Alkaline Phosphatase: 86 U/L (ref 38–126)
Anion gap: 9 (ref 5–15)
BUN: 18 mg/dL (ref 6–20)
CO2: 25 mmol/L (ref 22–32)
Calcium: 8.9 mg/dL (ref 8.9–10.3)
Chloride: 97 mmol/L — ABNORMAL LOW (ref 98–111)
Creatinine, Ser: 1.32 mg/dL — ABNORMAL HIGH (ref 0.61–1.24)
GFR, Estimated: >60 mL/min (ref >60)
Glucose, Bld: 94 mg/dL (ref 70–99)
Potassium: 3.7 mmol/L (ref 3.5–5.1)
Sodium: 131 mmol/L — ABNORMAL LOW (ref 135–145)
Total Bilirubin: 0.9 mg/dL (ref 0.3–1.2)
Total Protein: 7.2 g/dL (ref 6.5–8.1)

## 2021-07-23 LAB — CBC WITH DIFFERENTIAL/PLATELET
Abs Immature Granulocytes: 0.02 10*3/uL (ref 0.00–0.07)
Basophils Absolute: 0 10*3/uL (ref 0.0–0.1)
Basophils Relative: 1 %
Eosinophils Absolute: 0.2 10*3/uL (ref 0.0–0.5)
Eosinophils Relative: 4 %
HCT: 34 % — ABNORMAL LOW (ref 39.0–52.0)
Hemoglobin: 11.3 g/dL — ABNORMAL LOW (ref 13.0–17.0)
Immature Granulocytes: 0 %
Lymphocytes Relative: 22 %
Lymphs Abs: 1.2 10*3/uL (ref 0.7–4.0)
MCH: 31.8 pg (ref 26.0–34.0)
MCHC: 33.2 g/dL (ref 30.0–36.0)
MCV: 95.8 fL (ref 80.0–100.0)
Monocytes Absolute: 1.4 10*3/uL — ABNORMAL HIGH (ref 0.1–1.0)
Monocytes Relative: 25 %
Neutro Abs: 2.6 10*3/uL (ref 1.7–7.7)
Neutrophils Relative %: 48 %
Platelets: 394 10*3/uL (ref 150–400)
RBC: 3.55 MIL/uL — ABNORMAL LOW (ref 4.22–5.81)
RDW: 15.3 % (ref 11.5–15.5)
WBC: 5.6 10*3/uL (ref 4.0–10.5)
nRBC: 0 % (ref 0.0–0.2)

## 2021-07-23 MED ORDER — HEPARIN SOD (PORK) LOCK FLUSH 100 UNIT/ML IV SOLN
500.0000 [IU] | Freq: Once | INTRAVENOUS | Status: AC
  Administered 2021-07-23: 500 [IU] via INTRAVENOUS
  Filled 2021-07-23: qty 5

## 2021-07-23 MED ORDER — SODIUM CHLORIDE 0.9% FLUSH
10.0000 mL | INTRAVENOUS | Status: DC | PRN
  Administered 2021-07-23: 10 mL via INTRAVENOUS
  Filled 2021-07-23: qty 10

## 2021-07-23 NOTE — Telephone Encounter (Signed)
Spoke with Megan with [facility] triage to ask if pt should continue taking decadron while awaiting surgery. Jinny Blossom stated will send message to clinical team and call back with recommendations.

## 2021-07-23 NOTE — Progress Notes (Signed)
Hematology/Oncology Consult note [facility]  [phone] [phone]  Patient Care Team: Default, Provider, MD as PCP - General Telford Nab, RN as Registered Nurse Sindy Guadeloupe, MD as Consulting Physician (Hematology and Oncology) Sindy Guadeloupe, MD as Consulting Physician (Hematology and Oncology)   Name of the patient: Kyle Guerrero  [id number]  03/08/1971   Date of visit: 07/23/21  Diagnosis- Non-small cell lung cancer stage IV acT2 cN2 cM1 a with pleural involvement  Chief complaint/ Reason for visit-on treatment assessment prior to cycle 11 of maintenance [name]  Heme/Onc history: patient is a 50 year old male who presented to the ER with symptoms of heaviness in his chest and upper left chest discomfort he underwent CT angio chest to rule out PE which showed 3.8 x 3.3 cm left upper palpable lung mass along with 4.4 x 3.3 cm lobulated mass in the aortopulmonary window and 2.6 x 2.4 cm left hilar mass all concerning for malignancy.  Patient has also seen pulmonary and has been set up for bronchoscopy and EBUS guided biopsy on 11/23/2019.  Patient underwent.  PET CT scan which showed a hypermetabolic spiculated 3.5 cm of 5 left upper lobe lung mass, adjacent hypermetabolic 3.2 x 1.2 cm pleural metastases in the medial posterior by the left pleural space along with scalloping of the adjacent posterior left third rib.  Hypermetabolic infiltrative left perihilar conglomerate nodal metastases measuring up to 7.3 x 3.6 cm and 0.8 cm high left mediastinal node between the left brachiocephalic vein and left subclavian artery.  No evidence of distant metastatic disease   Biopsy showed non-small cell lung cancer but further characterization could not be determined.  Insufficient tissue for NGS testing. Repeat biopsy done. Results of NGS testing showed PD-L1 50%.  Tumor mutational burden high.  ERBB2 copy number again. 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    Patient completed concurrent chemoradiation with carbotaxol chemotherapy followed by 2 cycles of carbotaxol Keytruda and was on maintenance Keytruda   Patient was complaining of left shoulder pain and underwent MRI of the shoulder which showed 8 in the supraspinatus tendon.  He was subsequently seen by [facility] and underwent MRI of the cervical spine which showed a 4.6 x 4.8 x 8.2 cm mass in the left prevertebral region from C2-C7 levels.  The mass partially encases the left vertebral artery and abuts the preforaminal segment.  Invades the vertebral bodies and edema of the left C5 articular pillar.  There is slight extension into the left C4-C5 thecal sac without central thecal sac compression.  The mass displaces the left pharynx anteriorly along the left carotid sheath.  Patient had a repeat biopsy which was consistent with adenocarcinoma.   Patient underwent palliative radiation treatment to his neck mass and completed 4 cycles of carboplatin and [name] with excellent response to treatment and is currently on maintenance [name].  Repeat NGS testing was also performed which did not show any evidence of actionable mutations    Interval history-patient was seen for a second opinion at [facility] by both neurosurgery and medical oncology and states that he was offered surgery for his neck mass.  Surgery date has not been decided yet but they will be reaching out to him in a week's time.  Presently pain is relatively well controlled with as needed oxycodone and he does not desire any changes in his pain medication regimen  ECOG PS- 1 Pain scale- 0   Review of systems- Review  of Systems  Constitutional:  Negative for chills, fever, malaise/fatigue and weight loss.  HENT:  Negative for congestion, ear discharge and nosebleeds.   Eyes:  Negative for blurred vision.  Respiratory:  Negative for cough, hemoptysis, sputum production, shortness of breath and wheezing.    Cardiovascular:  Negative for chest pain, palpitations, orthopnea and claudication.  Gastrointestinal:  Negative for abdominal pain, blood in stool, constipation, diarrhea, heartburn, melena, nausea and vomiting.  Genitourinary:  Negative for dysuria, flank pain, frequency, hematuria and urgency.  Musculoskeletal:  Negative for back pain, joint pain and myalgias.       Left shoulder pain  Skin:  Negative for rash.  Neurological:  Negative for dizziness, tingling, focal weakness, seizures, weakness and headaches.  Endo/Heme/Allergies:  Does not bruise/bleed easily.  Psychiatric/Behavioral:  Negative for depression and suicidal ideas. The patient does not have insomnia.     No Known Allergies   Past Medical History:  Diagnosis Date   Asthma    Lung cancer ([place])      Past Surgical History:  Procedure Laterality Date   CYST EXCISION     IR IMAGING GUIDED PORT INSERTION  12/05/2019   VIDEO BRONCHOSCOPY WITH ENDOBRONCHIAL ULTRASOUND Left 11/22/2019   Procedure: VIDEO BRONCHOSCOPY WITH ENDOBRONCHIAL ULTRASOUND;  Surgeon: Tyler Pita, MD;  Location: [facility] ORS;  Service: Thoracic;  Laterality: Left;    Social History   Socioeconomic History   Marital status: Single    Spouse name: Not on file   Number of children: Not on file   Years of education: Not on file   Highest education level: Not on file  Occupational History   Not on file  Tobacco Use   Smoking status: Former    Packs/day: 2.00    Types: Cigarettes    Quit date: 11/08/2019    Years since quitting: 1.7   Smokeless tobacco: Never  Vaping Use   Vaping Use: Never used  Substance and Sexual Activity   Alcohol use: Not Currently   Drug use: Yes    Types: Marijuana   Sexual activity: Not on file  Other Topics Concern   Not on file  Social History Narrative   Not on file   Social Determinants of Health   Financial Resource Strain: Not on file  Food Insecurity: Not on file  Transportation Needs: Not on  file  Physical Activity: Not on file  Stress: Not on file  Social Connections: Not on file  Intimate Partner Violence: Not on file    Family History  Problem Relation Age of Onset   Healthy Mother    Healthy Father      Current Outpatient Medications:    baclofen (LIORESAL) 10 MG tablet, Take 1 tablet (10 mg total) by mouth 3 (three) times daily., Disp: 45 each, Rfl: 0   dexamethasone (DECADRON) 4 MG tablet, Take 1 tablet (4 mg total) by mouth 3 (three) times daily., Disp: 30 tablet, Rfl: 0   folic acid (FOLVITE) 1 MG tablet, Take 1 tablet (1 mg total) by mouth daily. Start 5-7 days before [name] chemotherapy. Continue until 21 days after [name] completed., Disp: 100 tablet, Rfl: 3   loratadine (CLARITIN) 10 MG tablet, Take 10 mg by mouth as directed. 1 tablet starting with each chemo for 1 week, Disp: , Rfl:    oxyCODONE (OXY IR/ROXICODONE) 5 MG immediate release tablet, Take 1 tablet (5 mg total) by mouth every 4 (four) hours as needed for moderate pain or severe pain., Disp: 180 tablet,  Rfl: 0   pantoprazole (PROTONIX) 20 MG tablet, Take 1 tablet (20 mg total) by mouth daily., Disp: 30 tablet, Rfl: 1   pregabalin (LYRICA) 75 MG capsule, Take 1 capsule (75 mg total) by mouth 2 (two) times daily., Disp: 60 capsule, Rfl: 0   ondansetron (ZOFRAN) 8 MG tablet, Take 1 tablet (8 mg total) by mouth 2 (two) times daily as needed (Nausea or vomiting). Start if needed on the third day after chemotherapy. (Patient not taking: Reported on 07/23/2021), Disp: 30 tablet, Rfl: 1 No current facility-administered medications for this visit.  Facility-Administered Medications Ordered in Other Visits:    heparin lock flush 100 unit/mL, 500 Units, Intravenous, Once, Sindy Guadeloupe, MD   heparin lock flush 100 unit/mL, 500 Units, Intravenous, Once, Sindy Guadeloupe, MD   sodium chloride flush (NS) 0.9 % injection 10 mL, 10 mL, Intravenous, PRN, Sindy Guadeloupe, MD, 10 mL at 05/01/20 0900   sodium chloride flush  (NS) 0.9 % injection 10 mL, 10 mL, Intravenous, PRN, Sindy Guadeloupe, MD, 10 mL at 01/29/21 [id number]  Physical exam:  Physical Exam Constitutional:      General: He is not in acute distress. Cardiovascular:     Rate and Rhythm: Normal rate and regular rhythm.     Heart sounds: Normal heart sounds.  Pulmonary:     Effort: Pulmonary effort is normal.     Breath sounds: Normal breath sounds.  Abdominal:     General: Bowel sounds are normal.     Palpations: Abdomen is soft.  Skin:    General: Skin is warm and dry.  Neurological:     Mental Status: He is alert and oriented to person, place, and time.     CMP Latest Ref Rng & Units 07/23/2021  Glucose 70 - 99 mg/dL 94  BUN 6 - 20 mg/dL 18  Creatinine 0.61 - 1.24 mg/dL 1.32(H)  Sodium 135 - 145 mmol/L 131(L)  Potassium 3.5 - 5.1 mmol/L 3.7  Chloride 98 - 111 mmol/L 97(L)  CO2 22 - 32 mmol/L 25  Calcium 8.9 - 10.3 mg/dL 8.9  Total Protein 6.5 - 8.1 g/dL 7.2  Total Bilirubin 0.3 - 1.2 mg/dL 0.9  Alkaline Phos 38 - 126 U/L 86  AST 15 - 41 U/L 14(L)  ALT 0 - 44 U/L 14   CBC Latest Ref Rng & Units 07/23/2021  WBC 4.0 - 10.5 K/uL 5.6  Hemoglobin 13.0 - 17.0 g/dL 11.3(L)  Hematocrit 39.0 - 52.0 % 34.0(L)  Platelets 150 - 400 K/uL 394    No images are attached to the encounter.  NM PET Image Restag (PS) Skull Base To Thigh  Result Date: 07/20/2021 CLINICAL DATA:  Subsequent treatment strategy for non-small cell lung cancer. EXAM: NUCLEAR MEDICINE PET SKULL BASE TO THIGH TECHNIQUE: 9.1 mCi F-18 FDG was injected intravenously. Full-ring PET imaging was performed from the skull base to thigh after the radiotracer. CT data was obtained and used for attenuation correction and anatomic localization. Fasting blood glucose: 93 mg/dl COMPARISON:  Multiple exams, including 08/12/2020 and CT of 05/05/2021 FINDINGS: Mediastinal blood pool activity: SUV max 1.8 Liver activity: SUV max NA NECK: The invasive mass involving the left paravertebral space with  associated destructive findings of the left C4, C5, and C6 vertebra is observed with central necrosis and hyperintense margin, maximum SUV 11.2 (formerly 20.3). The associated abnormal hypermetabolic activity measures approximately 4.7 by 3.9 by 5.1 cm (volume = 49 cm^3), previously about 200 cubic cm. Incidental CT  findings: none CHEST: The previously hypermetabolic left supraclavicular lymph node has resolved. Continued substantial volume loss in the left upper lobe associated with bandlike regions of atelectasis/scarring and potentially with a component of radiation fibrosis, extending down to the left perihilar region. This is not changed from 05/05/2021 but has evolved compared to 08/12/2020, with only low-grade activity along the region of presumed radiation therapy related findings and atelectasis, with a maximum SUV of about 2.5 Incidental CT findings: Small left pleural effusion. Right Port-A-Cath tip: SVC. ABDOMEN/PELVIS: No significant abnormal hypermetabolic activity in this region. Incidental CT findings: Minimal aortoiliac atherosclerotic vascular disease. SKELETON: Chronic medial fractures of the left third and fourth ribs, possibly related to weakening from radiation therapy. No substantial associated hypermetabolic activity to suggest underlying neoplastic cause. Incidental CT findings: none IMPRESSION: 1. The left paravertebral mass with destructive findings associated with C4, C5, and C6 demonstrates substantial reduction from the original PET-CT, currently 49 cubic cm and previously 200 cubic cm. Activity has also reduced, with evidence of central necrosis and a current maximum SUV of 11.2, formerly 20.3. 2. Interval resolution of the previous left supraclavicular hypermetabolic adenopathy. 3. Continued volume loss and bandlike density at the left lung apex likely therapy related, with only low-grade activity in this vicinity. Surveillance is likely warranted. 4. Small left pleural effusion is  new compared to the prior exam. 5.  Aortic Atherosclerosis (ICD10-I70.0). 6. Chronic medial fractures of the left third and fourth ribs. Electronically Signed   By: Van Clines M.D.   On: 07/20/2021 13:12     Assessment and plan- Patient is a 50 y.o. male  with metastatic adenocarcinoma of the lung with pleural metastases then soft tissue involvement of the neck as well as cervical vertebrae.  He is here for on treatment assessment prior to cycle 10 of maintenance [name]  I have reviewed recent PET/CT scan images independently and discussed findings with the patient.Patient does have residual left paravertebral space mass currently measuring 4.7 x 3.9 x 5.1 cm.  This mass has overall decreased in size as compared to his PET scan in August 2021 but does appear increased in size since May 2022 with an SUV of 11.2.  He does not have any other evidence of distant metastatic disease or a primary lung mass.  Patient was evaluated by neurosurgery as well as medical oncology at [facility] for second opinion and as per patient he is being offered surgery for his neck mass.I am currently awaiting final recommendations by medical oncology.  However given that there is a possibility of surgery in the near future I would hold off on any chemotherapy at this time especially since patient has chemo induced neutropenia.  Patient will need radiation treatment following his surgery as well and he has already been evaluated by Dr. Cheron Schaumann.  Based on medical oncology opinion I will decide about his systemic treatment down the line.  I will tentatively see him back in 5 weeks time and he will be seen by radiation oncology on that day as well.  Neoplasm related pain: He will continue as needed oxycodone and we will touch base with neurosurgery regarding continuing his steroids.   Visit Diagnosis 1. Malignant neoplasm of upper lobe of left lung ([place])   2. Neoplasm related pain   3. Chemotherapy induced neutropenia ([facility])       Dr. Randa Evens, MD, MPH [facility] [phone] 07/23/2021 4:43 PM

## 2021-07-23 NOTE — Progress Notes (Signed)
Pt here for treatment and he is getting worse from the nerves in shoulder and neck. He has seen in [facility] and is planning surgery but pt states he will hear back from the surgery date next week he hopes

## 2021-07-27 ENCOUNTER — Other Ambulatory Visit: Payer: Self-pay | Admitting: *Deleted

## 2021-07-27 MED ORDER — BACLOFEN 10 MG PO TABS
10.0000 mg | ORAL_TABLET | Freq: Three times a day (TID) | ORAL | 0 refills | Status: DC
Start: 2021-07-27 — End: 2021-08-17

## 2021-07-28 ENCOUNTER — Encounter: Payer: Self-pay | Admitting: Oncology

## 2021-07-28 NOTE — Telephone Encounter (Signed)
Spoke with patient regarding decadron. Per pt, he has not been taking the decadron frequently and only reports taking it every other day. Instructed pt to discontinue decadron at this time since has only taken every other day recently. Pt verbalized understanding.   Pt has not heard from [facility] regarding surgery. Will continue to follow.

## 2021-07-28 NOTE — Telephone Encounter (Signed)
Received message from [place] at [facility] regarding decadron. Per Dr. Loletta Specter, the prescribing provider for the decadron would need to determine if he needs to continue taking at this time.

## 2021-07-28 NOTE — Telephone Encounter (Signed)
Any word on his surgery? Please have him slowly taper his decadron over a week and stop

## 2021-07-28 NOTE — Telephone Encounter (Signed)
No news on his surgery yet. I will ask patient if he has heard from [facility] regarding surgery when calling to give instructions for decadron. I will let you know what he says.

## 2021-08-02 ENCOUNTER — Ambulatory Visit: Payer: Medicaid Other

## 2021-08-04 ENCOUNTER — Encounter: Payer: Self-pay | Admitting: Oncology

## 2021-08-04 ENCOUNTER — Ambulatory Visit: Payer: Medicaid Other

## 2021-08-09 ENCOUNTER — Ambulatory Visit: Payer: Medicaid Other

## 2021-08-11 ENCOUNTER — Other Ambulatory Visit: Payer: Self-pay | Admitting: Oncology

## 2021-08-11 ENCOUNTER — Ambulatory Visit: Payer: Medicaid Other

## 2021-08-16 ENCOUNTER — Ambulatory Visit: Payer: Medicaid Other

## 2021-08-17 ENCOUNTER — Other Ambulatory Visit: Payer: Self-pay | Admitting: *Deleted

## 2021-08-17 ENCOUNTER — Other Ambulatory Visit: Payer: Self-pay | Admitting: Oncology

## 2021-08-17 DIAGNOSIS — C3412 Malignant neoplasm of upper lobe, left bronchus or lung: Secondary | ICD-10-CM

## 2021-08-17 MED ORDER — BACLOFEN 10 MG PO TABS: 10.0000 mg | ORAL_TABLET | Freq: Three times a day (TID) | ORAL | 1 refills | Status: DC

## 2021-08-17 MED ORDER — OXYCODONE HCL 5 MG PO TABS: 5.0000 mg | ORAL_TABLET | ORAL | 0 refills | Status: DC | PRN

## 2021-08-17 NOTE — Telephone Encounter (Signed)
Pt requests refill of baclofen and oxycodone. States current dose helps control pain.

## 2021-08-25 ENCOUNTER — Ambulatory Visit: Payer: Medicaid Other | Admitting: Radiation Oncology

## 2021-08-27 ENCOUNTER — Ambulatory Visit: Payer: Medicaid Other | Admitting: Oncology

## 2021-08-27 ENCOUNTER — Other Ambulatory Visit: Payer: Medicaid Other

## 2021-08-27 ENCOUNTER — Ambulatory Visit: Payer: Medicaid Other | Admitting: Radiation Oncology

## 2021-08-29 DIAGNOSIS — C7951 Secondary malignant neoplasm of bone: Secondary | ICD-10-CM | POA: Insufficient documentation

## 2021-09-03 ENCOUNTER — Encounter: Payer: Self-pay | Admitting: Oncology

## 2021-09-09 ENCOUNTER — Other Ambulatory Visit: Payer: Self-pay | Admitting: Oncology

## 2021-09-09 ENCOUNTER — Other Ambulatory Visit: Payer: Self-pay | Admitting: *Deleted

## 2021-09-09 DIAGNOSIS — C3412 Malignant neoplasm of upper lobe, left bronchus or lung: Secondary | ICD-10-CM

## 2021-09-10 ENCOUNTER — Inpatient Hospital Stay: Payer: 59 | Attending: Oncology

## 2021-09-10 ENCOUNTER — Encounter: Payer: Self-pay | Admitting: Oncology

## 2021-09-10 ENCOUNTER — Telehealth: Payer: Self-pay | Admitting: *Deleted

## 2021-09-10 ENCOUNTER — Ambulatory Visit
Admission: RE | Admit: 2021-09-10 | Discharge: 2021-09-10 | Disposition: A | Discharge status: Home / Self Care | Payer: 59 | Source: Ambulatory Visit | Attending: Radiation Oncology | Admitting: Radiation Oncology

## 2021-09-10 ENCOUNTER — Other Ambulatory Visit: Payer: Self-pay

## 2021-09-10 ENCOUNTER — Inpatient Hospital Stay (HOSPITAL_BASED_OUTPATIENT_CLINIC_OR_DEPARTMENT_OTHER): Payer: 59 | Admitting: Oncology

## 2021-09-10 VITALS — BP 121/100 | HR 123 | Temp 97.5°F | Resp 18 | Wt 153.9 lb

## 2021-09-10 DIAGNOSIS — C7951 Secondary malignant neoplasm of bone: Secondary | ICD-10-CM

## 2021-09-10 DIAGNOSIS — Z79899 Other long term (current) drug therapy: Secondary | ICD-10-CM | POA: Diagnosis not present

## 2021-09-10 DIAGNOSIS — Z9221 Personal history of antineoplastic chemotherapy: Secondary | ICD-10-CM | POA: Diagnosis not present

## 2021-09-10 DIAGNOSIS — Z7189 Other specified counseling: Secondary | ICD-10-CM

## 2021-09-10 DIAGNOSIS — C3490 Malignant neoplasm of unspecified part of unspecified bronchus or lung: Secondary | ICD-10-CM

## 2021-09-10 DIAGNOSIS — C3412 Malignant neoplasm of upper lobe, left bronchus or lung: Secondary | ICD-10-CM | POA: Insufficient documentation

## 2021-09-10 DIAGNOSIS — Z87891 Personal history of nicotine dependence: Secondary | ICD-10-CM | POA: Diagnosis not present

## 2021-09-10 LAB — CBC WITH DIFFERENTIAL/PLATELET
Abs Immature Granulocytes: 0.06 10*3/uL (ref 0.00–0.07)
Basophils Absolute: 0 10*3/uL (ref 0.0–0.1)
Basophils Relative: 0 %
Eosinophils Absolute: 0.1 10*3/uL (ref 0.0–0.5)
Eosinophils Relative: 1 %
HCT: 32.6 % — ABNORMAL LOW (ref 39.0–52.0)
Hemoglobin: 11.1 g/dL — ABNORMAL LOW (ref 13.0–17.0)
Immature Granulocytes: 1 %
Lymphocytes Relative: 26 %
Lymphs Abs: 1.9 10*3/uL (ref 0.7–4.0)
MCH: 31.1 pg (ref 26.0–34.0)
MCHC: 34 g/dL (ref 30.0–36.0)
MCV: 91.3 fL (ref 80.0–100.0)
Monocytes Absolute: 0.9 10*3/uL (ref 0.1–1.0)
Monocytes Relative: 12 %
Neutro Abs: 4.4 10*3/uL (ref 1.7–7.7)
Neutrophils Relative %: 60 %
Platelets: 581 10*3/uL — ABNORMAL HIGH (ref 150–400)
RBC: 3.57 MIL/uL — ABNORMAL LOW (ref 4.22–5.81)
RDW: 13.4 % (ref 11.5–15.5)
WBC: 7.4 10*3/uL (ref 4.0–10.5)
nRBC: 0 % (ref 0.0–0.2)

## 2021-09-10 LAB — COMPREHENSIVE METABOLIC PANEL
ALT: 48 U/L — ABNORMAL HIGH (ref 0–44)
AST: 26 U/L (ref 15–41)
Albumin: 3.2 g/dL — ABNORMAL LOW (ref 3.5–5.0)
Alkaline Phosphatase: 90 U/L (ref 38–126)
Anion gap: 11 (ref 5–15)
BUN: 11 mg/dL (ref 6–20)
CO2: 27 mmol/L (ref 22–32)
Calcium: 8.8 mg/dL — ABNORMAL LOW (ref 8.9–10.3)
Chloride: 93 mmol/L — ABNORMAL LOW (ref 98–111)
Creatinine, Ser: 1.15 mg/dL (ref 0.61–1.24)
GFR, Estimated: >60 mL/min (ref >60)
Glucose, Bld: 108 mg/dL — ABNORMAL HIGH (ref 70–99)
Potassium: 3.2 mmol/L — ABNORMAL LOW (ref 3.5–5.1)
Sodium: 131 mmol/L — ABNORMAL LOW (ref 135–145)
Total Bilirubin: 0.6 mg/dL (ref 0.3–1.2)
Total Protein: 7.8 g/dL (ref 6.5–8.1)

## 2021-09-10 MED ORDER — SODIUM CHLORIDE 0.9% FLUSH
10.0000 mL | INTRAVENOUS | Status: DC | PRN
  Administered 2021-09-10: 10 mL via INTRAVENOUS
  Filled 2021-09-10: qty 10

## 2021-09-10 MED ORDER — POTASSIUM CHLORIDE 20 MEQ PO PACK: 20.0000 meq | PACK | Freq: Every day | ORAL | 0 refills | Status: DC

## 2021-09-10 MED ORDER — HEPARIN SOD (PORK) LOCK FLUSH 100 UNIT/ML IV SOLN
500.0000 [IU] | Freq: Once | INTRAVENOUS | Status: AC
  Filled 2021-09-10: qty 5

## 2021-09-10 MED ORDER — HEPARIN SOD (PORK) LOCK FLUSH 100 UNIT/ML IV SOLN
INTRAVENOUS | Status: AC
  Administered 2021-09-10: 500 [IU] via INTRAVENOUS
  Filled 2021-09-10: qty 5

## 2021-09-10 NOTE — Telephone Encounter (Signed)
Per Dr. Janese Banks, pt needs to increase oral fluid intake and take oral potassium 7mEq daily x 10 days. Prescription sent into pharmacy as powder packet since pt has difficulty swallowing currently. Pt made aware and he verbalized understanding.

## 2021-09-10 NOTE — Progress Notes (Signed)
Hematology/Oncology Consult note [facility]  [phone] [phone]  Patient Care Team: Default, Provider, MD as PCP - General Telford Nab, RN as Registered Nurse Sindy Guadeloupe, MD as Consulting Physician (Hematology and Oncology) Sindy Guadeloupe, MD as Consulting Physician (Hematology and Oncology)   Name of the patient: Kyle Guerrero  [id number]  20-Sep-1971   Date of visit: 09/10/21  Diagnosis- Non-small cell lung cancer stage IV acT2 cN2 cM1 a with pleural involvement  Chief complaint/ Reason for visit-discuss final pathology results and further management  Heme/Onc history:  patient is a 50 year old male who presented to the ER with symptoms of heaviness in his chest and upper left chest discomfort he underwent CT angio chest to rule out PE which showed 3.8 x 3.3 cm left upper palpable lung mass along with 4.4 x 3.3 cm lobulated mass in the aortopulmonary window and 2.6 x 2.4 cm left hilar mass all concerning for malignancy.  Patient has also seen pulmonary and has been set up for bronchoscopy and EBUS guided biopsy on 11/23/2019.  Patient underwent.  PET CT scan which showed a hypermetabolic spiculated 3.5 cm of 5 left upper lobe lung mass, adjacent hypermetabolic 3.2 x 1.2 cm pleural metastases in the medial posterior by the left pleural space along with scalloping of the adjacent posterior left third rib.  Hypermetabolic infiltrative left perihilar conglomerate nodal metastases measuring up to 7.3 x 3.6 cm and 0.8 cm high left mediastinal node between the left brachiocephalic vein and left subclavian artery.  No evidence of distant metastatic disease   Biopsy showed non-small cell lung cancer but further characterization could not be determined.  Insufficient tissue for NGS testing. Repeat biopsy done. Results of NGS testing showed PD-L1 50%.  Tumor mutational burden high.  ERBB2 copy number again. NGS testing on peripheral blood  showed NTRK mutation.    Patient completed concurrent chemoradiation with carbotaxol chemotherapy followed by 2 cycles of carbotaxol Keytruda and was on maintenance Keytruda   Patient was complaining of left shoulder pain and underwent MRI of the shoulder which showed tear in the supraspinatus tendon.  He was subsequently seen by [facility] and underwent MRI of the cervical spine which showed a 4.6 x 4.8 x 8.2 cm mass in the left prevertebral region from C2-C7 levels.  The mass partially encases the left vertebral artery and abuts the preforaminal segment.  Invades the vertebral bodies and edema of the left C5 articular pillar.  There is slight extension into the left C4-C5 thecal sac without central thecal sac compression.  The mass displaces the left pharynx anteriorly along the left carotid sheath.  Patient had a repeat biopsy which was consistent with adenocarcinoma.   Patient underwent palliative radiation treatment to his neck mass and completed 4 cycles of carboplatin and [name] with excellent response to treatment and is currently on maintenance [name].  Repeat NGS testing was also performed which did not show any evidence of actionable mutations other than ERBB2 gain  Patient had recurrence of his neck pain and repeat imaging showedInterim increase in the size of his neck mass and patient went for second opinion for both medical oncology as well as neurosurgery continue current was decided to cooperate upon this mass for definitive resection.  Patient underwent resection of cervical tumor which showed poorly differentiated metastatic adenocarcinoma on 08/29/2021.  TTF-1 negative.   Interval history-patient is recovering well from his surgery.  He still has a neck collar in place.  He has function mainly in his left hand but not much function in his left arm and forearm.  However his pain is significantly better at this time.  He is not using much of his pain medication.  ECOG PS- 1 Pain scale-  0 Opioid associated constipation- no  Review of systems- Review of Systems  Constitutional:  Negative for chills, fever, malaise/fatigue and weight loss.  HENT:  Negative for congestion, ear discharge and nosebleeds.   Eyes:  Negative for blurred vision.  Respiratory:  Negative for cough, hemoptysis, sputum production, shortness of breath and wheezing.   Cardiovascular:  Negative for chest pain, palpitations, orthopnea and claudication.  Gastrointestinal:  Negative for abdominal pain, blood in stool, constipation, diarrhea, heartburn, melena, nausea and vomiting.  Genitourinary:  Negative for dysuria, flank pain, frequency, hematuria and urgency.  Musculoskeletal:  Negative for back pain, joint pain and myalgias.       Left arm weakness  Skin:  Negative for rash.  Neurological:  Negative for dizziness, tingling, focal weakness, seizures, weakness and headaches.  Endo/Heme/Allergies:  Does not bruise/bleed easily.  Psychiatric/Behavioral:  Negative for depression and suicidal ideas. The patient does not have insomnia.       No Known Allergies   Past Medical History:  Diagnosis Date   Asthma    Lung cancer ([place])      Past Surgical History:  Procedure Laterality Date   CYST EXCISION     IR IMAGING GUIDED PORT INSERTION  12/05/2019   VIDEO BRONCHOSCOPY WITH ENDOBRONCHIAL ULTRASOUND Left 11/22/2019   Procedure: VIDEO BRONCHOSCOPY WITH ENDOBRONCHIAL ULTRASOUND;  Surgeon: Tyler Pita, MD;  Location: [facility] ORS;  Service: Thoracic;  Laterality: Left;    Social History   Socioeconomic History   Marital status: Single    Spouse name: Not on file   Number of children: Not on file   Years of education: Not on file   Highest education level: Not on file  Occupational History   Not on file  Tobacco Use   Smoking status: Former    Packs/day: 2.00    Types: Cigarettes    Quit date: 11/08/2019    Years since quitting: 1.8   Smokeless tobacco: Never  Vaping Use   Vaping  Use: Never used  Substance and Sexual Activity   Alcohol use: Not Currently   Drug use: Yes    Types: Marijuana   Sexual activity: Not on file  Other Topics Concern   Not on file  Social History Narrative   Not on file   Social Determinants of Health   Financial Resource Strain: Not on file  Food Insecurity: Not on file  Transportation Needs: Not on file  Physical Activity: Not on file  Stress: Not on file  Social Connections: Not on file  Intimate Partner Violence: Not on file    Family History  Problem Relation Age of Onset   Healthy Mother    Healthy Father      Current Outpatient Medications:    baclofen (LIORESAL) 10 MG tablet, TAKE 1 TABLET BY MOUTH THREE TIMES A DAY, Disp: 90 tablet, Rfl: 1   dexamethasone (DECADRON) 1 MG tablet, PLEASE SEE ATTACHED FOR DETAILED DIRECTIONS, Disp: , Rfl:    folic acid (FOLVITE) 1 MG tablet, Take 1 tablet (1 mg total) by mouth daily. Start 5-7 days before [name] chemotherapy. Continue until 21 days after [name] completed., Disp: 100 tablet, Rfl: 3   loratadine (CLARITIN) 10 MG tablet, Take 10 mg by mouth as directed.  1 tablet starting with each chemo for 1 week, Disp: , Rfl:    MIRALAX 17 g packet, Take 1 packet by mouth daily., Disp: , Rfl:    oxyCODONE (OXY IR/ROXICODONE) 5 MG immediate release tablet, Take 1 tablet (5 mg total) by mouth every 4 (four) hours as needed for moderate pain or severe pain., Disp: 180 tablet, Rfl: 0   pantoprazole (PROTONIX) 20 MG tablet, Take 1 tablet (20 mg total) by mouth daily., Disp: 30 tablet, Rfl: 1   pregabalin (LYRICA) 75 MG capsule, Take 1 capsule (75 mg total) by mouth 2 (two) times daily., Disp: 60 capsule, Rfl: 0   SENEXON-S 8.6-50 MG tablet, Take 1 tablet by mouth daily., Disp: , Rfl:    azelastine (ASTELIN) 0.1 % nasal spray, Place into the nose. (Patient not taking: Reported on 09/10/2021), Disp: , Rfl:    dexamethasone (DECADRON) 4 MG tablet, Take 1 tablet (4 mg total) by mouth 3 (three) times  daily. (Patient not taking: Reported on 09/10/2021), Disp: 30 tablet, Rfl: 0   ondansetron (ZOFRAN) 8 MG tablet, Take 1 tablet (8 mg total) by mouth 2 (two) times daily as needed (Nausea or vomiting). Start if needed on the third day after chemotherapy. (Patient not taking: No sig reported), Disp: 30 tablet, Rfl: 1   pantoprazole (PROTONIX) 40 MG tablet, Take 40 mg by mouth daily. (Patient not taking: Reported on 09/10/2021), Disp: , Rfl:    potassium chloride (KLOR-CON) 20 MEQ packet, Take 20 mEq by mouth daily., Disp: 10 packet, Rfl: 0   prochlorperazine (COMPAZINE) 10 MG tablet, TAKE 1 TABLET (10 MG TOTAL) BY MOUTH EVERY 6 (SIX) HOURS AS NEEDED (NAUSEA OR VOMITING). (Patient not taking: Reported on 09/10/2021), Disp: 30 tablet, Rfl: 1 No current facility-administered medications for this visit.  Facility-Administered Medications Ordered in Other Visits:    heparin lock flush 100 unit/mL, 500 Units, Intravenous, Once, Sindy Guadeloupe, MD   heparin lock flush 100 unit/mL, 500 Units, Intravenous, Once, Sindy Guadeloupe, MD   sodium chloride flush (NS) 0.9 % injection 10 mL, 10 mL, Intravenous, PRN, Sindy Guadeloupe, MD, 10 mL at 05/01/20 0900   sodium chloride flush (NS) 0.9 % injection 10 mL, 10 mL, Intravenous, PRN, Sindy Guadeloupe, MD, 10 mL at 01/29/21 0835  Physical exam:  Vitals:   09/10/21 0947  BP: (!) 121/100  Pulse: (!) 123  Resp: 18  Temp: (!) 97.5 F (36.4 C)  SpO2: 99%  Weight: 153 lb 14.4 oz (69.8 kg)   Physical Exam Neck:     Comments: Midline posterior neck incision is healing well.  Neck collar in place Cardiovascular:     Rate and Rhythm: Normal rate and regular rhythm.     Heart sounds: Normal heart sounds.  Pulmonary:     Effort: Pulmonary effort is normal.     Breath sounds: Normal breath sounds.  Musculoskeletal:     Comments: Sling in place over left arm.  Power is 5 x 5 in the left hand muscles  Skin:    General: Skin is warm and dry.  Neurological:     Mental  Status: He is alert and oriented to person, place, and time.     CMP Latest Ref Rng & Units 09/10/2021  Glucose 70 - 99 mg/dL 108(H)  BUN 6 - 20 mg/dL 11  Creatinine 0.61 - 1.24 mg/dL 1.15  Sodium 135 - 145 mmol/L 131(L)  Potassium 3.5 - 5.1 mmol/L 3.2(L)  Chloride 98 - 111 mmol/L  93(L)  CO2 22 - 32 mmol/L 27  Calcium 8.9 - 10.3 mg/dL 8.8(L)  Total Protein 6.5 - 8.1 g/dL 7.8  Total Bilirubin 0.3 - 1.2 mg/dL 0.6  Alkaline Phos 38 - 126 U/L 90  AST 15 - 41 U/L 26  ALT 0 - 44 U/L 48(H)   CBC Latest Ref Rng & Units 09/10/2021  WBC 4.0 - 10.5 K/uL 7.4  Hemoglobin 13.0 - 17.0 g/dL 11.1(L)  Hematocrit 39.0 - 52.0 % 32.6(L)  Platelets 150 - 400 K/uL 581(H)      Assessment and plan- Patient is a 50 y.o. male with recurrent metastatic adenocarcinoma of the lung mainly with recurrence in the form of a large tumor involving the cervical spine s/p definitive surgery here to discuss further management.  Prior to her surgical resection patient underwent a PET CT scan in July 2022 Which showed a 4.7 x 3.9 x 5.1 cm mass involving the left paravertebral space and destructive findings in the C4-5-6 vertebra.  Other than that he did not have significant Droz disease outside of his neck.  He has undergone definitive resection of this tumor which was again consistent with poorly differentiated adenocarcinoma.  He will currently be working with home physical therapy and we will also make referral to [facility] as well as nutrition and speech pathology.    Patient is also meeting Dr. Donella Stade today to discuss postoperative radiation.  He is not in significant pain at this time and I will give him the next 3 to 4 weeks to recover from his surgery as well as go through radiation treatment and work with rehab.  I will plan to get a CT soft tissue neck as well as CT chest abdomen and pelvis with contrast in about 4 weeks and see him thereafter.  Based on CT scan results we could consider reinitiating third  line treatment with Enhertu since he had ER BB2 gain noted on NGS testing on both his tumor biopsies.  Patient had evidence of renal injury secondary to [name] which has now resolved.  Labs do show evidence of hyponatremia hypochloremia as well as hypokalemia likely due to poor nutritional intake and dehydration.  We will send him a prescription for oral potassium and ask him to improve his electrolyte intake in the form of Gatorade or Pedialyte   Visit Diagnosis 1. Malignant neoplasm of lung, unspecified laterality, unspecified part of lung ([place])   2. Goals of care, counseling/discussion      Dr. Randa Evens, MD, MPH [facility] at [facility] [phone] 09/10/2021 8:29 PM

## 2021-09-13 NOTE — Progress Notes (Signed)
Radiation Oncology Follow up Note  Name: Kyle Guerrero   Date:   09/10/2021 MRN:  [id number] DOB: May 13, 1971    This 50 y.o. male presents to the clinic today for reevaluation of cervical metastatic disease and patient with known.  Non-small cell lung cancer status post concurrent chemoradiation therapy as well as immunotherapy with Beryle Flock now status post surgery of residual disease involving the left cervical spine  REFERRING PROVIDER: No ref. provider found  HPI: Patient is a 50 year old male well-known to our department.  He completed concurrent chemoradiation therapy and was retreated for progressive metastatic disease in the cervical spine with SBRT 30 Gray in 5 fractions.  To the region of the C5 vertebral body.  His original mass was 4.7 x 3.9 x 5.1 cm involving the left paravertebral space and destruction of C4-5-6 vertebral bodies.  Patient underwent anterior fusion with instrumentation of the C3-C7.  Preoperative MRI scan showed persistent ill-defined soft tissue, consistent with tumor present predominance to the left aspect of vertebral column at the levels of C4-5 through C6-7.  There is effacement left neuroforamina at these levels in case of the left vertebral artery with narrowing of the flow.  Surgical solid showed again poorly differentiated metastatic adenocarcinoma.  He is doing fairly well at this time pain has significantly resolved.  I have asked to evaluate him for possible further radiation therapy. COMPLICATIONS OF TREATMENT: none  FOLLOW UP COMPLIANCE: keeps appointments   PHYSICAL EXAM:  There were no vitals taken for this visit. Patient has cervical collar as well as sling of his left upper extremity.  No focal neurologic deficits are noted.  Well-developed well-nourished patient in NAD. HEENT reveals PERLA, EOMI, discs not visualized.  Oral cavity is clear. No oral mucosal lesions are identified. Neck is clear without evidence of cervical or supraclavicular  adenopathy. Lungs are clear to A&P. Cardiac examination is essentially unremarkable with regular rate and rhythm without murmur rub or thrill. Abdomen is benign with no organomegaly or masses noted. Motor sensory and DTR levels are equal and symmetric in the upper and lower extremities. Cranial nerves II through XII are grossly intact. Proprioception is intact. No peripheral adenopathy or edema is identified. No motor or sensory levels are noted. Crude visual fields are within normal range.  RADIOLOGY RESULTS: MRI scans and PET CT scans are all reviewed compatible with above-stated findings  PLAN: At this time like to take patient is simulation.  I will do fusion studies to see if there is any further room for radiation therapy without doing injury to his spinal cord.  Again risks and benefits of radiation therapy were reviewed with the patient.  We will be able to make better determination once I have taken the simulation and do fusion studies.  Patient comprehends my recommendations well.  I would like to take this opportunity to thank you for allowing me to participate in the care of your patient.Noreene Filbert, MD

## 2021-09-14 ENCOUNTER — Ambulatory Visit
Admission: RE | Admit: 2021-09-14 | Discharge: 2021-09-14 | Disposition: A | Discharge status: Home / Self Care | Payer: Self-pay | Source: Ambulatory Visit | Attending: Radiation Oncology | Admitting: Radiation Oncology

## 2021-09-14 ENCOUNTER — Other Ambulatory Visit: Payer: Self-pay | Admitting: *Deleted

## 2021-09-14 DIAGNOSIS — C3490 Malignant neoplasm of unspecified part of unspecified bronchus or lung: Secondary | ICD-10-CM

## 2021-09-14 NOTE — Progress Notes (Signed)
Outside images for MRI cervical spine on 08/28/21 at [facility] requested.

## 2021-09-15 ENCOUNTER — Encounter: Payer: Self-pay | Admitting: Neurosurgery

## 2021-09-15 ENCOUNTER — Inpatient Hospital Stay: Payer: 59

## 2021-09-15 ENCOUNTER — Other Ambulatory Visit: Payer: Self-pay

## 2021-09-15 ENCOUNTER — Inpatient Hospital Stay: Payer: 59 | Admitting: Occupational Therapy

## 2021-09-15 DIAGNOSIS — R29898 Other symptoms and signs involving the musculoskeletal system: Secondary | ICD-10-CM

## 2021-09-15 NOTE — Progress Notes (Signed)
Nutrition Assessment   Reason for Assessment:   Weight loss   ASSESSMENT:  50 year old male with recurrent metastatic adenocarcinoma of lung.  Large tumor found in cervical spine with mass displacing the left pharynx, s/p surgery.  Planning radiation.  Met with patient in clinic.  Patient reports that he was not able to eat food given during hospital admission as it was not good.  Reports that he ate the pudding, applesauce and drank the boost shake.  Says that he drinks about 1 boost per day.  Sometimes will go to [employer] and get 2 eggs, bacon/sausage, hashbrowns and able to eat about 50%.  Lunch and dinner maybe oodles and noodles or beefaroni.  Says that pills are difficult for him to swallow. He says that sometimes he gets choked on foods when he is eating but usually not when drinking.  Reports constipation during hospital admission be this has resolved and having bowel movement daily after starting stool softner and miralax.     Medications: miralax, zforan, KCL, compazine, dexamethasone, protonix   Labs: reviewed   Anthropometrics:   Height: 71 inches Weight: 153 lb  7/26 174 lb BMI: 21  12% weight loss in the last 2 months, significant   Estimated Energy Needs  Kcals: 2100-2450 Protein: 105-122 g Fluid: 2.1 L   NUTRITION DIAGNOSIS: Inadequate oral intake related to cancer and cancer related treatment side effects as evidenced by 12% weight loss in the last 2 months, decreased intake/options to be able to swallow   INTERVENTION:  Patient will be screened by Gwenette Greet, OT for rehab needs (PT/OT/SLP) today following RD visit Discussed ways to add calories and protein to diet.  Encouraged chopping foods well and adding moisture to foods for ease of swallowing.   Encouraged oral nutrition supplements of 350 calories or more drinking TID. Coupons and complimentary case of ensure plus given to patient today Contact information given   MONITORING, EVALUATION, GOAL:  weight trends, intake   Next Visit: Friday, Oct 14 after MD visit  Doreena Maulden B. Zenia Resides, [place], [place] Registered Dietitian [phone] (mobile)

## 2021-09-15 NOTE — Therapy (Signed)
[facility] [address] [place], [place], [place], [address] Phone: [phone]   Fax:  [phone]  Occupational Therapy Screen:  Patient Details  Name: Kyle Guerrero MRN: [id number] Date of Birth: 08-Aug-1971 No data recorded  Encounter Date: 09/15/2021   OT End of Session - 09/15/21 1115     Visit Number 0             Past Medical History:  Diagnosis Date   Asthma    Lung cancer [facility]     Past Surgical History:  Procedure Laterality Date   CYST EXCISION     IR IMAGING GUIDED PORT INSERTION  12/05/2019   VIDEO BRONCHOSCOPY WITH ENDOBRONCHIAL ULTRASOUND Left 11/22/2019   Procedure: VIDEO BRONCHOSCOPY WITH ENDOBRONCHIAL ULTRASOUND;  Surgeon: Tyler Pita, MD;  Location: [facility] ORS;  Service: Thoracic;  Laterality: Left;    There were no vitals filed for this visit.   Subjective Assessment - 09/15/21 1114     Subjective  I could not wait to see you again- I lost my motion in my L arm again - cannot bend or lift it up- my hand and wrist is good- numbness in my thumb thru middle finger - surgeon told me that I don't need to wear the collar anymore    Currently in Pain? No/denies                Neurosurgery-Evaluation VISIT 09/13/21  REASON FOR VISIT/CHIEF COMPLAINT: 2 week post op appointment   HISTORY OF PRESENT ILLNESS:   [name] is a 50 y.o. male with lung adenocarcinoma initially diagnosed in 2020 s/p carbo/ RT f/b carbo/ taxol/ pembrolizumab, RT to cervical/ neck mass in 2021 who originally presented with progressive pain and weakness in setting of residual cervical spine disease. Therefore, [name] underwent C3-C7 ACDF with C4-6 corpectomy (08/27/21) and C2-T2 PSF on (08/29/21).  Today patient states his pain is well controlled. Patient denies changes in left arm weakness. He continues to weakness primarily in left deltoid and bicep. Tricep as well as wrist extension/flexion are  improving. Patient denies PT/OT. Patient continues to use oxycodone and muscle relaxer for pain relief. Overall, patient states their recovery has been going well and their pain has been well managed since surgery. Patient presents in [place] J and figure 8. Patient denies headaches, weakness, numbness and bowel or bladder changes. Patient presents today for routine postoperative visit.   History obtained from patient.  Review of Systems:  A 10 point review of systems is negative, except for those pertinent positives detailed in the nursing intake note and the HPI.   Incision: The incision is c/d/i without erythema, drainage or other signs of infection. The patient is alerted to watch for any signs of infection (redness, pus, pain, increased swelling or fever) and call if they develop any symptoms.   ASSESSMENT AND PLAN:   I am happy with Kyle Guerrero's recovery since surgery. We discussed appropriate activities and restrictions as well as routine precautions and postoperative expectations. Patient will follow postoperative guidelines of no heavy lifting, bending, and twisting. We will see patient again in 4 weeks for regular 6 week post op visit. Patient advised he may remove [place] J when eating, showering etc or when his neck is fully supported. Patient advised to continue figure 8 at this time. Patient understands concerning symptoms and when to contact if needing earlier follow up. The patient was encouraged to call us if any further  problems or questions should arise.    OT SCREEN 09/15/21: Pt was seen by this OT for screen last year for weakness of L UE - was refer to PT and he progressed great but had to stop because of insurance per pt. He return today s/p on 9/2 and 9/4 - pt show AROM in hand and wrist WNL - except sensation changes in thumb thru 3rd - but not new since this surgery Tricep 4/5 but no elbow flexion or shoulder  Pt request and this OT will recommend PT to eval and tx  again - pt to contact his neurosurgeon for PT order and instruction/precautions Wants to come to the same clinic than before- prefer male therapist. Pt has soft collar on this date and OT had to help pt donn figure 8 sling . Kept on during session collar.                               Patient will benefit from skilled therapeutic intervention in order to improve the following deficits and impairments:           Visit Diagnosis: Weakness of left upper extremity    Problem List Patient Active Problem List   Diagnosis Date Noted   Weakness of left arm 07/10/2020   Goals of care, counseling/discussion 11/29/2019   Malignant neoplasm of upper lobe of left lung ([place]) 11/29/2019   Non-small cell cancer of left lung ([place]) 11/29/2019   Hemoptysis 11/29/2019    Kyle Guerrero, Kyle Guerrero,Kyle Guerrero 09/15/2021, 11:15 AM  [facility] [address] [place], [place], [address] Phone: [phone]   Fax:  [phone]  Name: Kyle Guerrero MRN: [id number] Date of Birth: Sep 01, 1971

## 2021-09-16 ENCOUNTER — Ambulatory Visit
Admission: RE | Admit: 2021-09-16 | Discharge: 2021-09-16 | Disposition: A | Discharge status: Home / Self Care | Payer: 59 | Source: Ambulatory Visit | Attending: Radiation Oncology | Admitting: Radiation Oncology

## 2021-09-16 ENCOUNTER — Other Ambulatory Visit: Payer: Self-pay

## 2021-09-16 DIAGNOSIS — Z510 Encounter for antineoplastic radiation therapy: Secondary | ICD-10-CM | POA: Insufficient documentation

## 2021-09-16 DIAGNOSIS — C3490 Malignant neoplasm of unspecified part of unspecified bronchus or lung: Secondary | ICD-10-CM

## 2021-09-16 DIAGNOSIS — C770 Secondary and unspecified malignant neoplasm of lymph nodes of head, face and neck: Secondary | ICD-10-CM | POA: Insufficient documentation

## 2021-09-16 DIAGNOSIS — C7951 Secondary malignant neoplasm of bone: Secondary | ICD-10-CM

## 2021-09-16 MED ORDER — SUCRALFATE 1 G PO TABS: 1.0000 g | ORAL_TABLET | Freq: Three times a day (TID) | ORAL | 0 refills | Status: DC

## 2021-09-17 ENCOUNTER — Other Ambulatory Visit: Payer: Self-pay | Admitting: *Deleted

## 2021-09-17 DIAGNOSIS — C3490 Malignant neoplasm of unspecified part of unspecified bronchus or lung: Secondary | ICD-10-CM

## 2021-09-22 DIAGNOSIS — Z510 Encounter for antineoplastic radiation therapy: Secondary | ICD-10-CM | POA: Diagnosis not present

## 2021-09-23 ENCOUNTER — Ambulatory Visit: Admission: RE | Admit: 2021-09-23 | Payer: 59 | Source: Ambulatory Visit

## 2021-09-23 ENCOUNTER — Inpatient Hospital Stay: Payer: 59

## 2021-09-24 ENCOUNTER — Inpatient Hospital Stay: Payer: 59

## 2021-09-27 ENCOUNTER — Ambulatory Visit: Payer: 59

## 2021-09-27 ENCOUNTER — Ambulatory Visit: Admission: RE | Admit: 2021-09-27 | Payer: 59 | Source: Ambulatory Visit

## 2021-09-27 ENCOUNTER — Inpatient Hospital Stay: Payer: 59

## 2021-09-27 DIAGNOSIS — M542 Cervicalgia: Secondary | ICD-10-CM | POA: Insufficient documentation

## 2021-09-27 DIAGNOSIS — M25512 Pain in left shoulder: Secondary | ICD-10-CM | POA: Insufficient documentation

## 2021-09-27 DIAGNOSIS — Z87891 Personal history of nicotine dependence: Secondary | ICD-10-CM | POA: Insufficient documentation

## 2021-09-27 DIAGNOSIS — C3412 Malignant neoplasm of upper lobe, left bronchus or lung: Secondary | ICD-10-CM | POA: Insufficient documentation

## 2021-09-27 DIAGNOSIS — Z79899 Other long term (current) drug therapy: Secondary | ICD-10-CM | POA: Insufficient documentation

## 2021-09-27 DIAGNOSIS — G893 Neoplasm related pain (acute) (chronic): Secondary | ICD-10-CM | POA: Insufficient documentation

## 2021-09-27 DIAGNOSIS — C7951 Secondary malignant neoplasm of bone: Secondary | ICD-10-CM | POA: Insufficient documentation

## 2021-09-27 DIAGNOSIS — C782 Secondary malignant neoplasm of pleura: Secondary | ICD-10-CM | POA: Insufficient documentation

## 2021-09-27 DIAGNOSIS — J45909 Unspecified asthma, uncomplicated: Secondary | ICD-10-CM | POA: Insufficient documentation

## 2021-09-27 DIAGNOSIS — Z510 Encounter for antineoplastic radiation therapy: Secondary | ICD-10-CM | POA: Insufficient documentation

## 2021-09-27 DIAGNOSIS — Z5111 Encounter for antineoplastic chemotherapy: Secondary | ICD-10-CM | POA: Insufficient documentation

## 2021-09-28 ENCOUNTER — Inpatient Hospital Stay: Payer: 59

## 2021-09-28 ENCOUNTER — Ambulatory Visit
Admission: RE | Admit: 2021-09-28 | Discharge: 2021-09-28 | Disposition: A | Discharge status: Home / Self Care | Payer: 59 | Source: Ambulatory Visit | Attending: Radiation Oncology | Admitting: Radiation Oncology

## 2021-09-28 DIAGNOSIS — G893 Neoplasm related pain (acute) (chronic): Secondary | ICD-10-CM | POA: Diagnosis not present

## 2021-09-28 DIAGNOSIS — C770 Secondary and unspecified malignant neoplasm of lymph nodes of head, face and neck: Secondary | ICD-10-CM | POA: Insufficient documentation

## 2021-09-28 DIAGNOSIS — Z5111 Encounter for antineoplastic chemotherapy: Secondary | ICD-10-CM | POA: Diagnosis not present

## 2021-09-28 DIAGNOSIS — Z87891 Personal history of nicotine dependence: Secondary | ICD-10-CM | POA: Insufficient documentation

## 2021-09-28 DIAGNOSIS — Z510 Encounter for antineoplastic radiation therapy: Secondary | ICD-10-CM | POA: Diagnosis not present

## 2021-09-28 DIAGNOSIS — C3412 Malignant neoplasm of upper lobe, left bronchus or lung: Secondary | ICD-10-CM | POA: Insufficient documentation

## 2021-09-28 DIAGNOSIS — C7951 Secondary malignant neoplasm of bone: Secondary | ICD-10-CM | POA: Insufficient documentation

## 2021-09-29 ENCOUNTER — Ambulatory Visit
Admission: RE | Admit: 2021-09-29 | Discharge: 2021-09-29 | Disposition: A | Discharge status: Home / Self Care | Payer: 59 | Source: Ambulatory Visit | Attending: Radiation Oncology | Admitting: Radiation Oncology

## 2021-09-29 ENCOUNTER — Inpatient Hospital Stay: Payer: 59

## 2021-09-29 DIAGNOSIS — Z510 Encounter for antineoplastic radiation therapy: Secondary | ICD-10-CM | POA: Diagnosis not present

## 2021-09-30 ENCOUNTER — Inpatient Hospital Stay: Payer: 59

## 2021-09-30 ENCOUNTER — Ambulatory Visit
Admission: RE | Admit: 2021-09-30 | Discharge: 2021-09-30 | Disposition: A | Discharge status: Home / Self Care | Payer: 59 | Source: Ambulatory Visit | Attending: Radiation Oncology | Admitting: Radiation Oncology

## 2021-09-30 DIAGNOSIS — Z510 Encounter for antineoplastic radiation therapy: Secondary | ICD-10-CM | POA: Diagnosis not present

## 2021-10-01 ENCOUNTER — Ambulatory Visit
Admission: RE | Admit: 2021-10-01 | Discharge: 2021-10-01 | Disposition: A | Discharge status: Home / Self Care | Payer: 59 | Source: Ambulatory Visit | Attending: Radiation Oncology | Admitting: Radiation Oncology

## 2021-10-01 ENCOUNTER — Inpatient Hospital Stay: Payer: 59

## 2021-10-01 ENCOUNTER — Other Ambulatory Visit: Payer: Self-pay

## 2021-10-01 DIAGNOSIS — C7951 Secondary malignant neoplasm of bone: Secondary | ICD-10-CM

## 2021-10-01 DIAGNOSIS — C3490 Malignant neoplasm of unspecified part of unspecified bronchus or lung: Secondary | ICD-10-CM

## 2021-10-01 DIAGNOSIS — Z510 Encounter for antineoplastic radiation therapy: Secondary | ICD-10-CM | POA: Diagnosis not present

## 2021-10-01 LAB — CBC
HCT: 38.6 % — ABNORMAL LOW (ref 39.0–52.0)
Hemoglobin: 12.4 g/dL — ABNORMAL LOW (ref 13.0–17.0)
MCH: 29 pg (ref 26.0–34.0)
MCHC: 32.1 g/dL (ref 30.0–36.0)
MCV: 90.2 fL (ref 80.0–100.0)
Platelets: 447 10*3/uL — ABNORMAL HIGH (ref 150–400)
RBC: 4.28 MIL/uL (ref 4.22–5.81)
RDW: 13.7 % (ref 11.5–15.5)
WBC: 6.6 10*3/uL (ref 4.0–10.5)
nRBC: 0 % (ref 0.0–0.2)

## 2021-10-03 ENCOUNTER — Other Ambulatory Visit: Payer: Self-pay | Admitting: Oncology

## 2021-10-04 ENCOUNTER — Other Ambulatory Visit: Payer: Self-pay

## 2021-10-04 ENCOUNTER — Encounter: Payer: Self-pay | Admitting: Oncology

## 2021-10-04 ENCOUNTER — Ambulatory Visit
Admission: RE | Admit: 2021-10-04 | Discharge: 2021-10-04 | Disposition: A | Discharge status: Home / Self Care | Payer: 59 | Source: Ambulatory Visit | Attending: Oncology | Admitting: Oncology

## 2021-10-04 ENCOUNTER — Ambulatory Visit
Admission: RE | Admit: 2021-10-04 | Discharge: 2021-10-04 | Disposition: A | Discharge status: Home / Self Care | Payer: 59 | Source: Ambulatory Visit | Attending: Radiation Oncology | Admitting: Radiation Oncology

## 2021-10-04 ENCOUNTER — Ambulatory Visit: Payer: 59

## 2021-10-04 DIAGNOSIS — C3490 Malignant neoplasm of unspecified part of unspecified bronchus or lung: Secondary | ICD-10-CM | POA: Insufficient documentation

## 2021-10-04 IMAGING — CT CT NECK W/ CM
3 of 5 series · 12 of 33 positions shown, 14 images · IV contrast (omnipaque)
Comparison: Neck CT [DATE].  Cervical spine MRI [DATE].

CLINICAL DATA: Restaging of metastatic lung cancer.

EXAM:
CT NECK WITH CONTRAST
TECHNIQUE: Multidetector CT imaging of the neck was performed using the
standard protocol following the bolus administration of intravenous
contrast.
CONTRAST:  100mL OMNIPAQUE IOHEXOL 350 MG/ML SOLN

[Series 5: coronals neck (person_name) 2.00 cor · coronal · 0.57mm/px · 3 of 120 slices shown]
[im 35/120  bone]
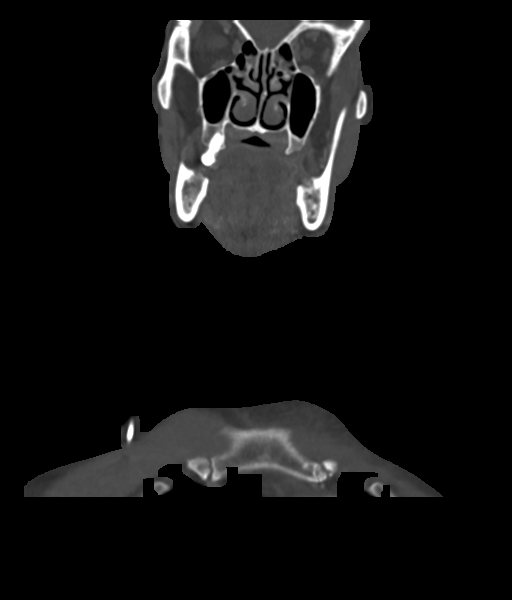
[im 52/120  bone]
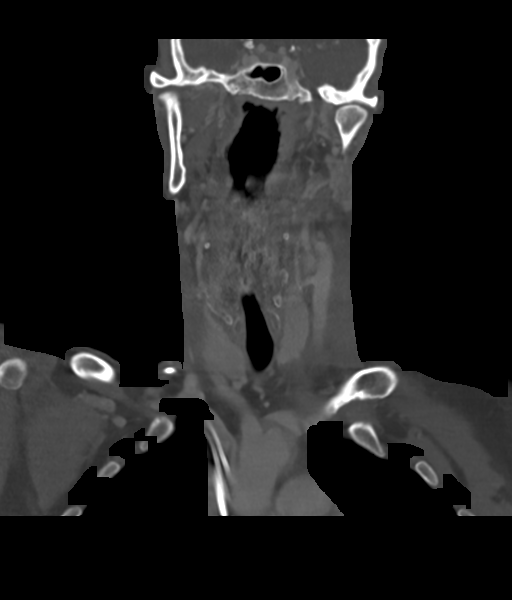
[im 68/120  bone]
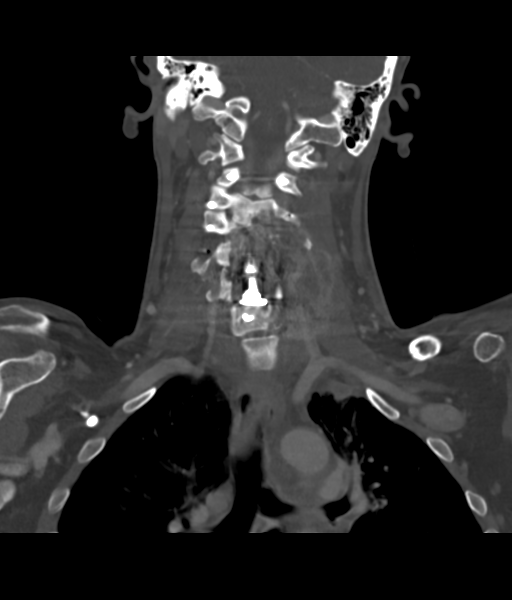

[Series 7: sagittals neck (person_name) 2.00 sag · sagittal · 0.47mm/px · 5 of 145 slices shown, 6 images]
[im 49/145  bone]
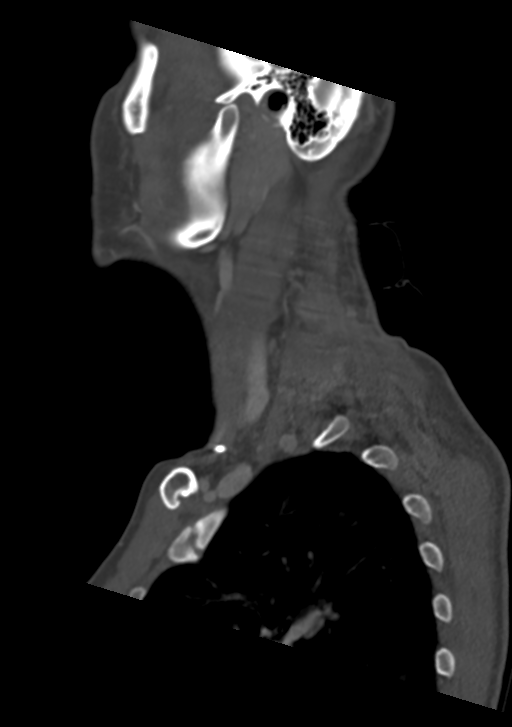
[im 61/145  bone]
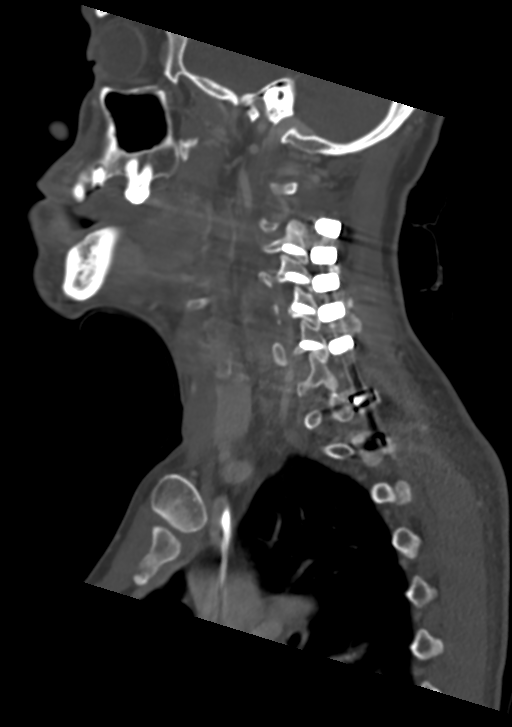
[im 73/145  soft-tissue]
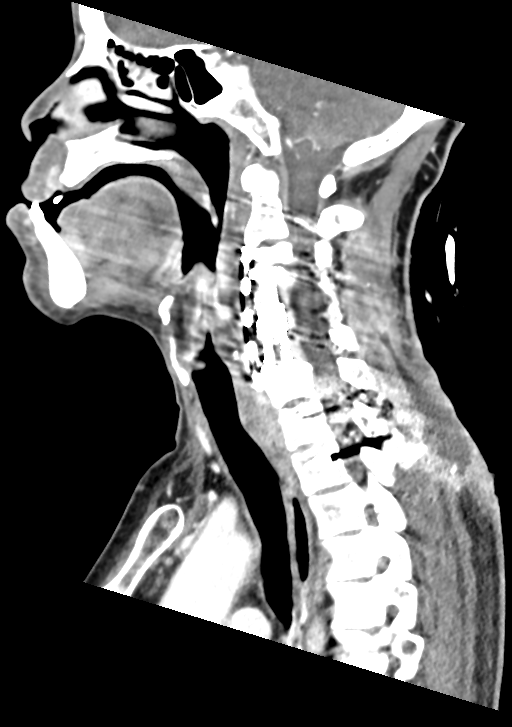
[im 73/145  bone]
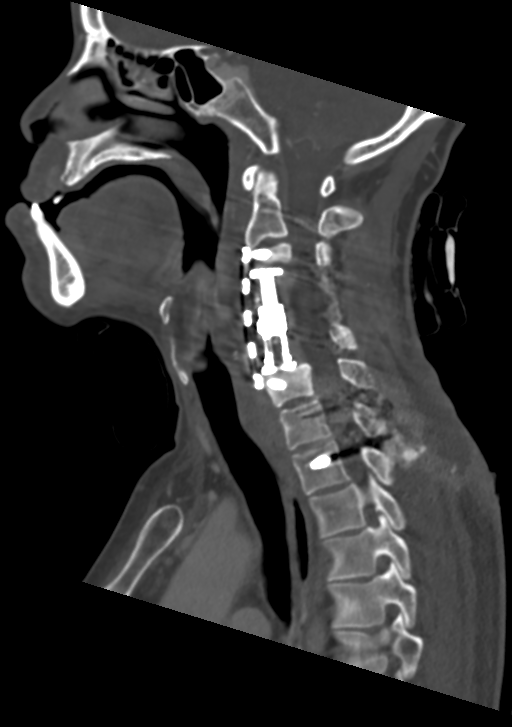
[im 85/145  bone]
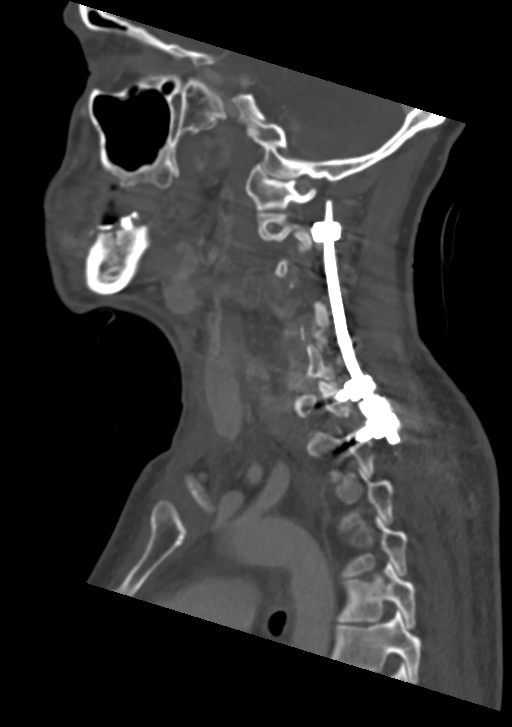
[im 97/145  bone]
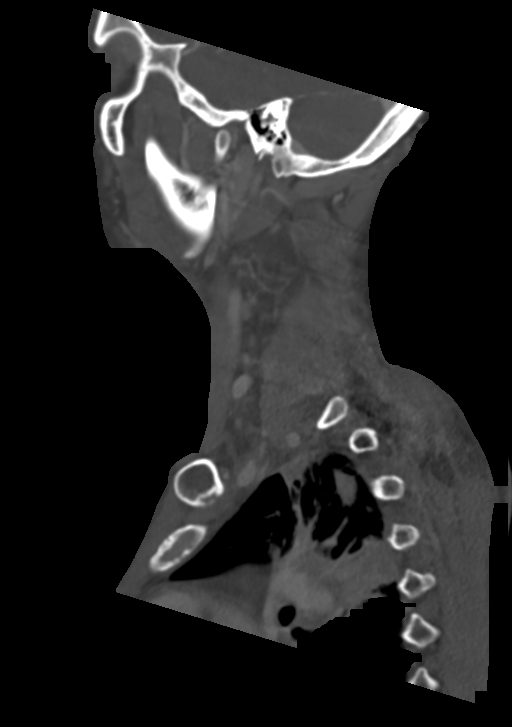

[Series 9: ax oropharynx neck (person_name) 2.00 ax · axial · 0.47mm/px · z∈[-863,-672]mm · 4 of 168 slices shown, 5 images]
[im 34/168  soft-tissue]
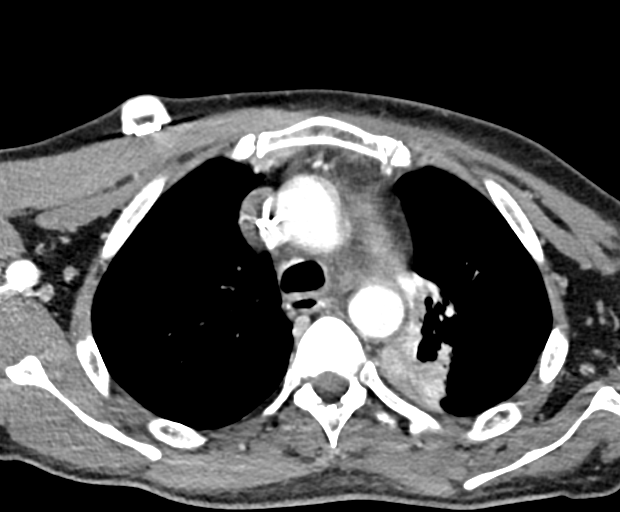
[im 34/168  bone]
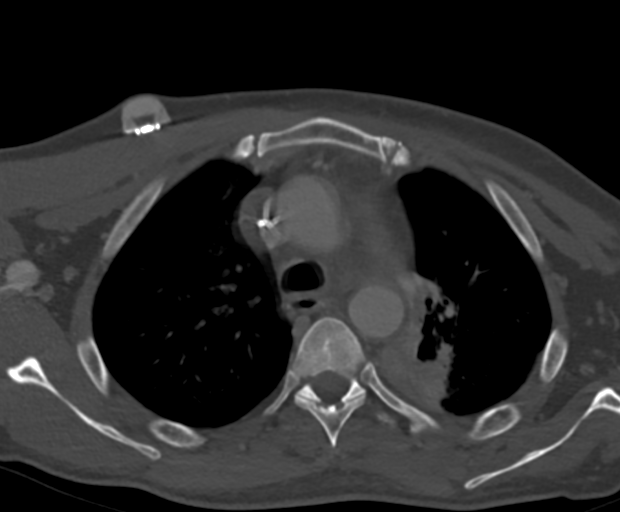
[im 67/168  bone]
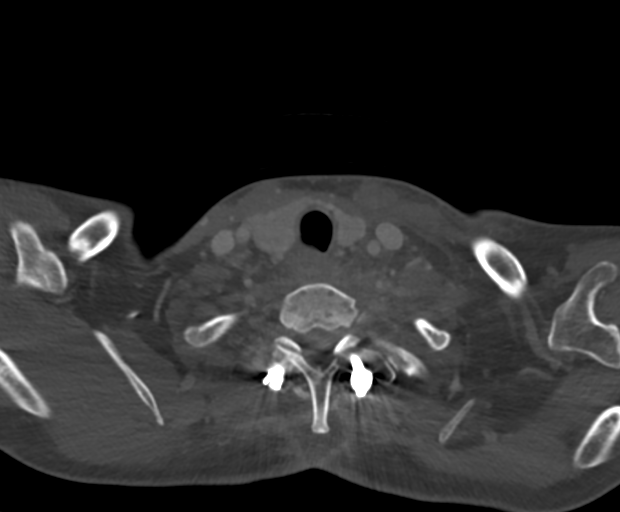
[im 101/168  bone]
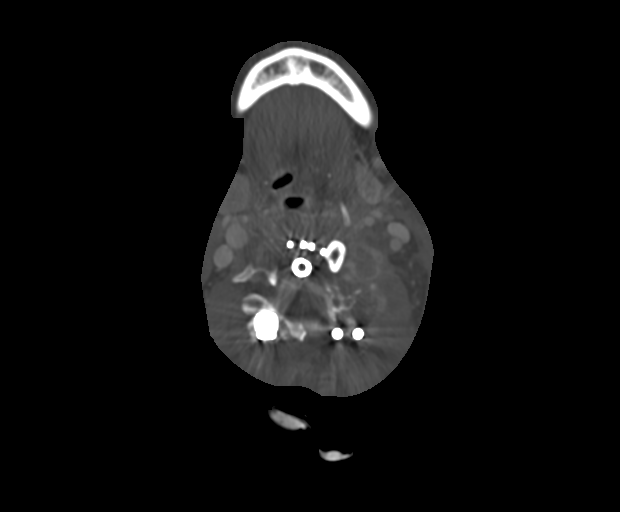
[im 134/168  bone]
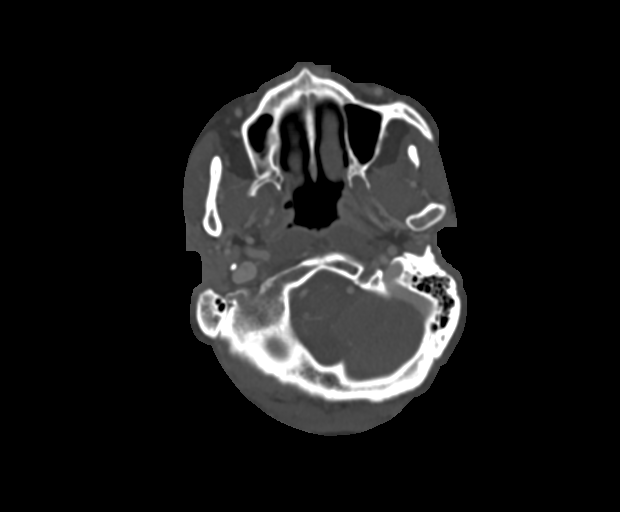

[12 of 33 positions shown; findings below may reference images not displayed]

FINDINGS: Pharynx and larynx: Similar appearance of generalized mucosal edema
compatible with prior radiation therapy. Small retropharyngeal
effusion, smaller than on the MRI.

Salivary glands: Post radiation changes most notably involving the
left submandibular gland.

Thyroid: Unremarkable.

Lymph nodes: No discrete enlarged lymph nodes in the neck.

Vascular: Major vascular structures of the neck remain grossly
patent including the left vertebral artery which is partially in
case by the mass.

Limited intracranial: Unremarkable.

Visualized orbits: Unremarkable.

Mastoids and visualized paranasal sinuses: Mild right greater than
left ethmoid air cell mucosal thickening. Clear mastoid air cells.

Skeleton: C4-C6 corpectomy and C3-C7 anterior fusion, new from the
prior neck CT though present on the interval MRI. Interval C2-T2
posterior fusion with articular pillar screws present at every level
on the right except C7 and with absent left-sided screws C4, C5, and
C7. Posterior bone graft material in place bilaterally. Destructive
changes involving residual left-sided posterior elements at C4, C5
and C6, new from the prior neck CT and but evident on the interval
MRI. Centrally low-density material with thick peripheral
enhancement in the left paravertebral region extending from
approximately C3-T1 with associated osseous erosion measures
approximately 4.8 x 3.7 x 7.4 cm (AP x transverse x craniocaudal)
and is consistent with previously shown necrotic tumor as well as
potentially postoperative fluid medially where there has presumably
been some tumor debulking. Overall, this tumor has greatly
progressed from the prior neck CT but is more similar in extent to
the more recent cervical spine MRI. A smaller amount of low-density,
peripherally enhancing material in the right paravertebral region is
new from the prior neck CT but also similar to the spine MRI.
Assessment for epidural tumor is limited by streak artifact. A
postoperative subcutaneous fluid collection in the posterior midline
of the neck extends from C6-T2 measuring 1.6 cm AP x 8.3 cm
craniocaudal.

Upper chest: Reported separately.

Other: None.
IMPRESSION: 1. Extensive postoperative changes in the cervical spine. Necrotic
paraspinal tumor has greatly progressed from a [DATE] neck CT
but is similar in extent to a more recent cervical spine MRI
similar.
2. No evidence of metastatic disease elsewhere in the neck.

## 2021-10-04 IMAGING — CT CT CHEST-ABD-PELV W/ CM
2 of 5 series · 12 of 36 positions shown, 14 images · IV contrast (omnipaque)
Comparison: PET-CT, [DATE]

CLINICAL DATA: Left upper lobe lung cancer restaging

EXAM:
CT CHEST, ABDOMEN, AND PELVIS WITH CONTRAST
TECHNIQUE: Multidetector CT imaging of the chest, abdomen and pelvis was
performed following the standard protocol during bolus
administration of intravenous contrast.
CONTRAST:  100mL OMNIPAQUE IOHEXOL 350 MG/ML SOLN, additional oral
enteric contrast

[Series 3: cap with (person_name) 5.00 ax · axial · 0.61mm/px · z∈[-1311,-801]mm · 9 of 128 slices shown, 11 images]
[im 13/128  mediastinal]
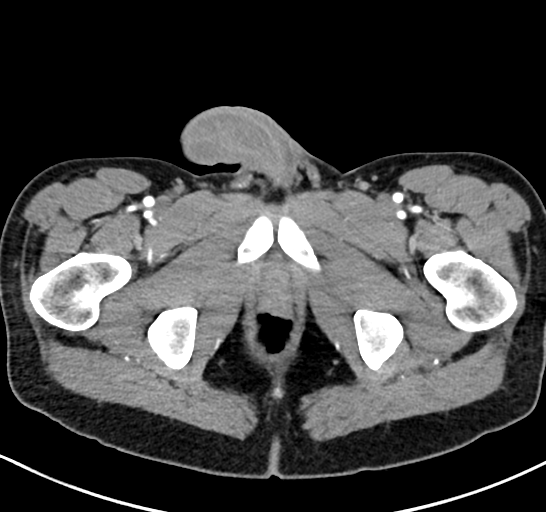
[im 13/128  bone]
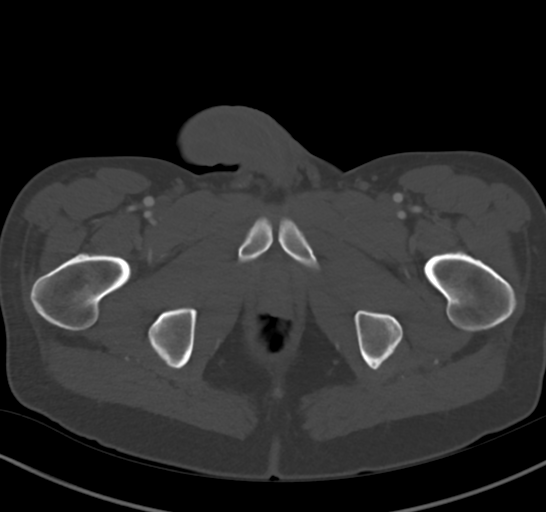
[im 26/128  mediastinal]
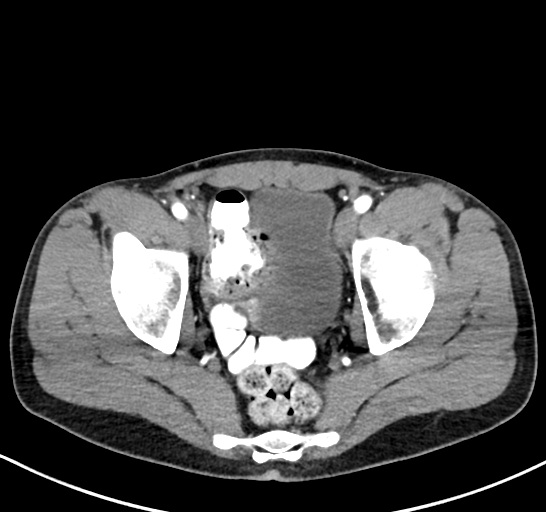
[im 39/128  mediastinal]
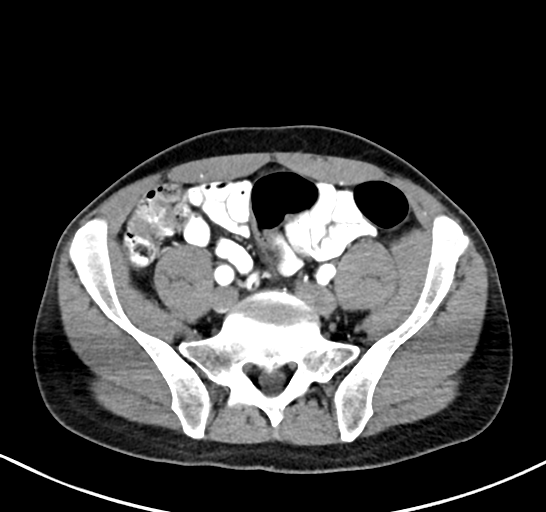
[im 51/128  mediastinal]
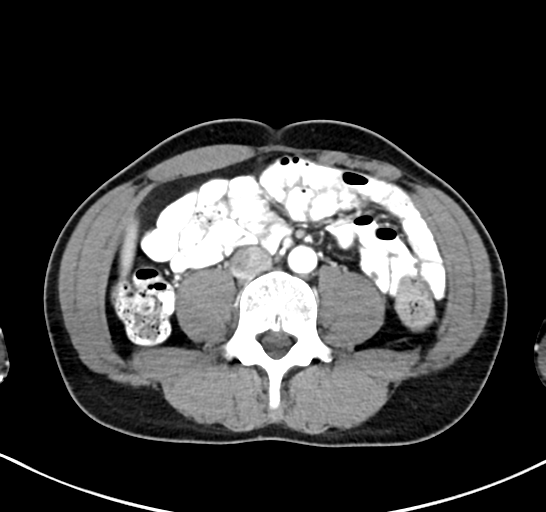
[im 64/128  mediastinal]
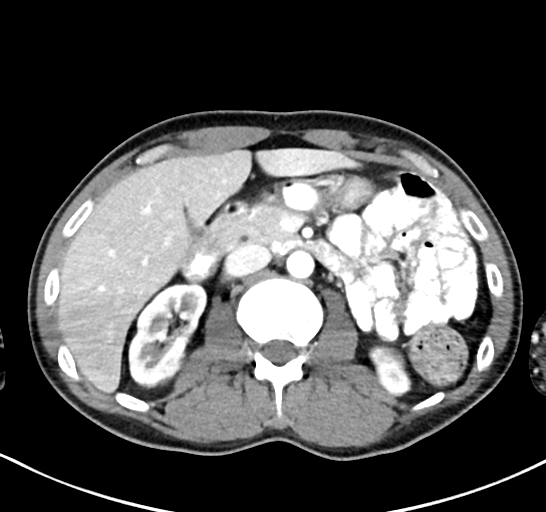
[im 77/128  mediastinal]
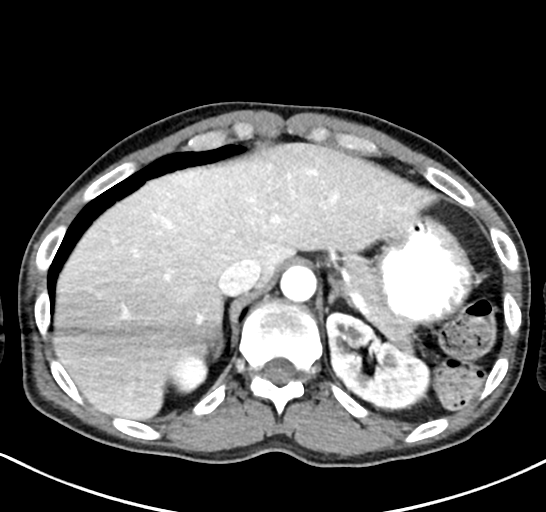
[im 89/128  mediastinal]
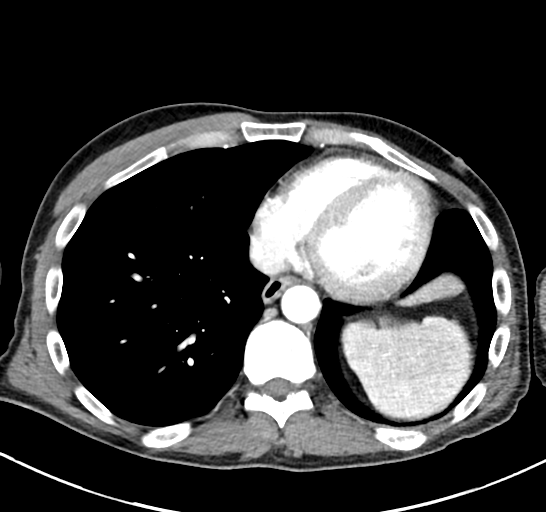
[im 102/128  mediastinal]
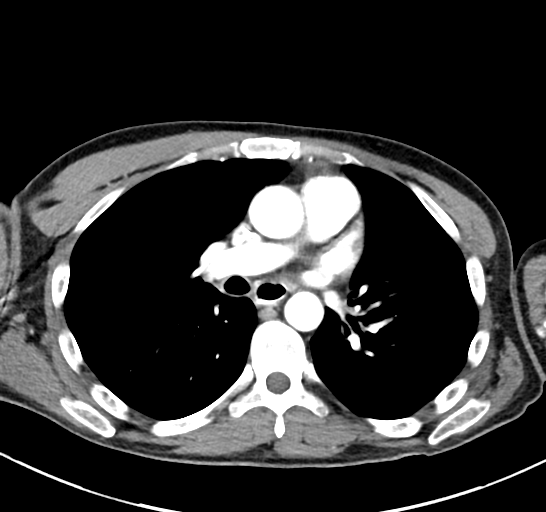
[im 115/128  mediastinal]
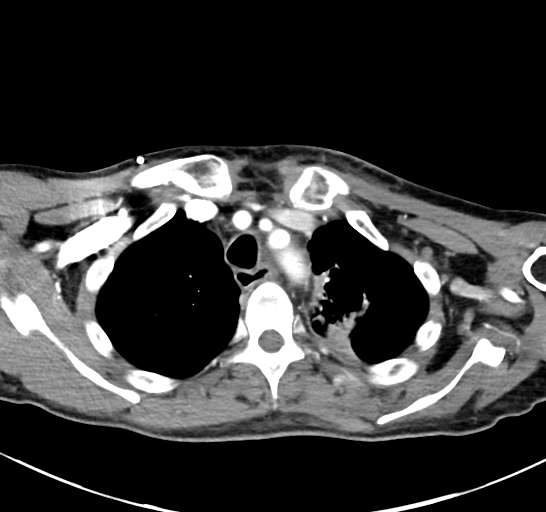
[im 115/128  bone]
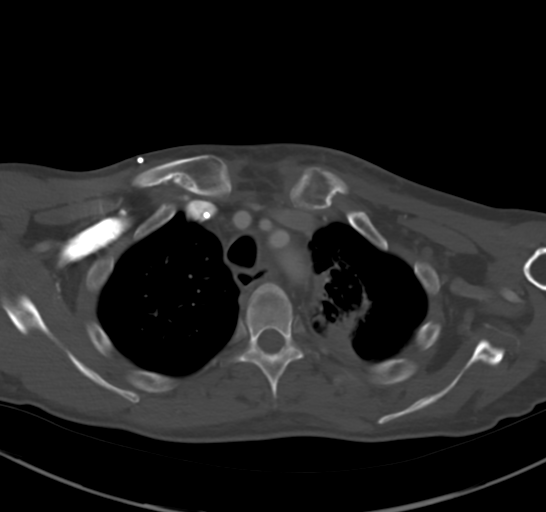

[Series 7: coronal cap with (person_name) 2.00 cor · coronal · 0.65mm/px · 3 of 124 slices shown]
[im 25/124  mediastinal]
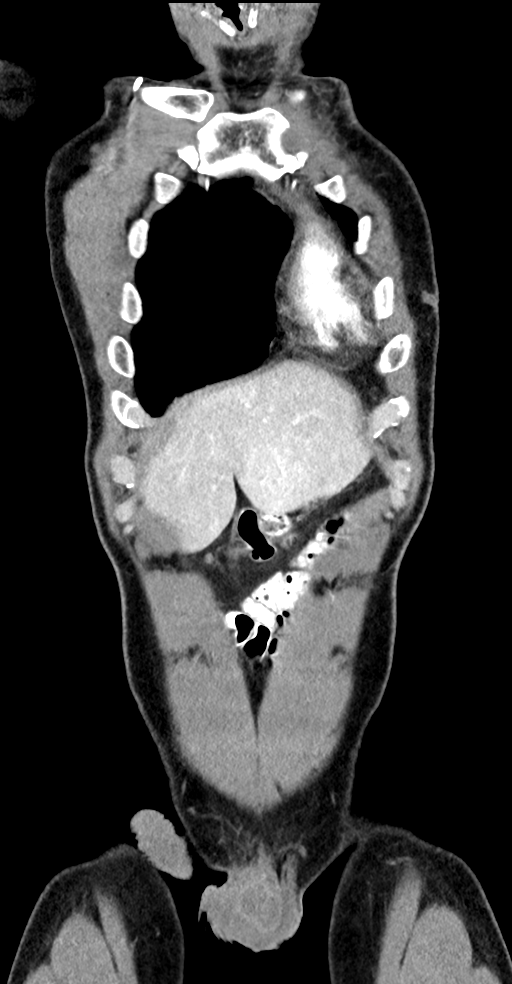
[im 50/124  mediastinal]
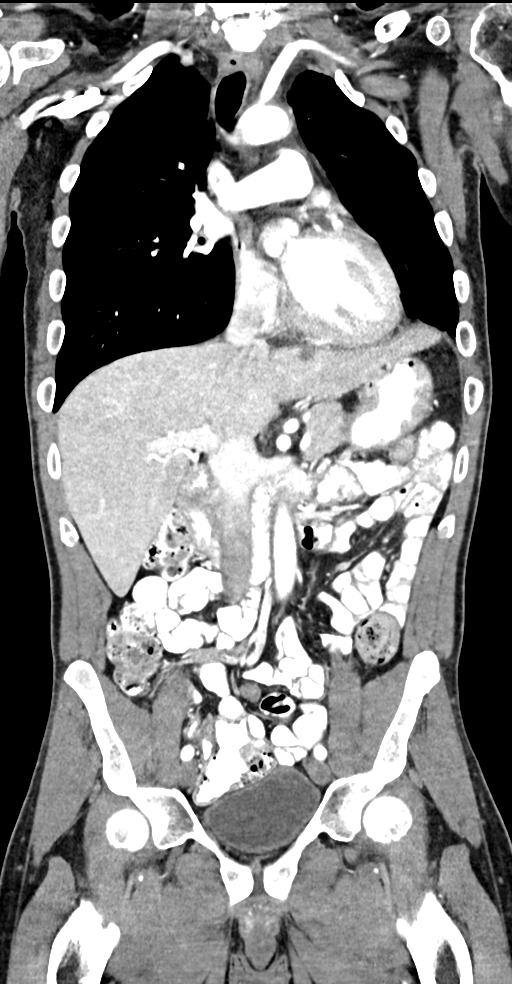
[im 74/124  mediastinal]
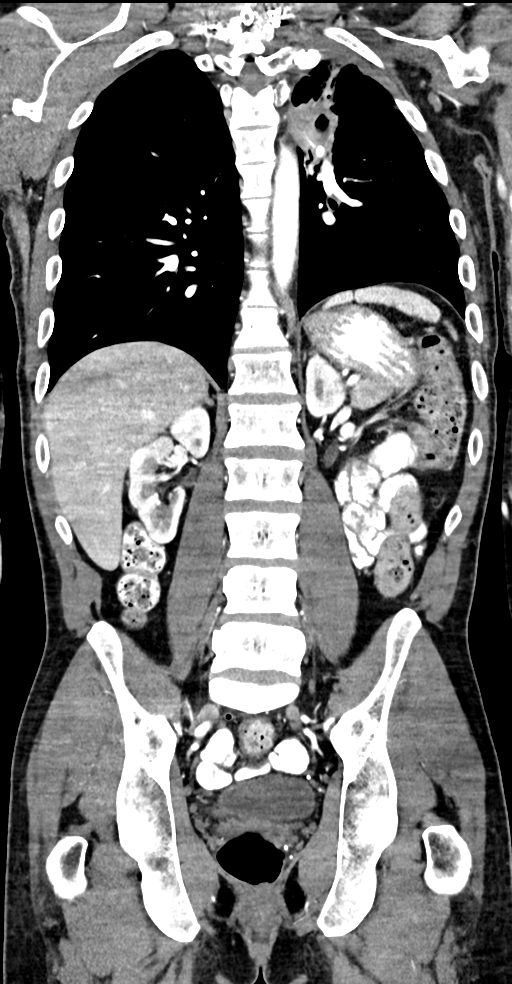

[12 of 36 positions shown; findings below may reference images not displayed]

FINDINGS: CT CHEST FINDINGS

Cardiovascular: Right chest port catheter. Normal heart size. No
pericardial effusion.

Mediastinum/Nodes: No enlarged mediastinal, hilar, or axillary lymph
nodes. Thyroid gland, trachea, and esophagus demonstrate no
significant findings.

Lungs/Pleura: Unchanged post treatment appearance of the paramedian
right upper lobe and perihilar left lung. Mild, diffuse bilateral
bronchial wall thickening. No pleural effusion or pneumothorax.

Musculoskeletal: No chest wall mass or suspicious bone lesions
identified.

CT ABDOMEN PELVIS FINDINGS

Hepatobiliary: No solid liver abnormality is seen. No gallstones,
gallbladder wall thickening, or biliary dilatation.

Pancreas: Unremarkable. No pancreatic ductal dilatation or
surrounding inflammatory changes.

Spleen: Normal in size without significant abnormality.

Adrenals/Urinary Tract: Adrenal glands are unremarkable. Kidneys are
normal, without renal calculi, solid lesion, or hydronephrosis.
Bladder is unremarkable.

Stomach/Bowel: Stomach is within normal limits. Appendix appears
normal. No evidence of bowel wall thickening, distention, or
inflammatory changes.

Vascular/Lymphatic: No significant vascular findings are present. No
enlarged abdominal or pelvic lymph nodes.

Reproductive: No mass or other abnormality.

Other: No abdominal wall hernia or abnormality. No abdominopelvic
ascites.

Musculoskeletal: No acute or significant osseous findings.
IMPRESSION: 1. Unchanged post treatment appearance of the paramedian right upper
lobe and perihilar left lung.
2. No evidence of recurrent or metastatic disease in the chest,
abdomen, or pelvis.
3. Mild, diffuse bilateral bronchial wall thickening, consistent
with nonspecific infectious or inflammatory bronchitis.
4. Please see separately reported examination of the neck.

## 2021-10-04 MED ORDER — IOHEXOL 350 MG/ML SOLN
100.0000 mL | Freq: Once | INTRAVENOUS | Status: AC | PRN
  Administered 2021-10-04: 100 mL via INTRAVENOUS

## 2021-10-05 ENCOUNTER — Inpatient Hospital Stay: Payer: 59

## 2021-10-05 ENCOUNTER — Ambulatory Visit
Admission: RE | Admit: 2021-10-05 | Discharge: 2021-10-05 | Disposition: A | Discharge status: Home / Self Care | Payer: 59 | Source: Ambulatory Visit | Attending: Radiation Oncology | Admitting: Radiation Oncology

## 2021-10-05 DIAGNOSIS — Z510 Encounter for antineoplastic radiation therapy: Secondary | ICD-10-CM | POA: Diagnosis not present

## 2021-10-06 ENCOUNTER — Ambulatory Visit
Admission: RE | Admit: 2021-10-06 | Discharge: 2021-10-06 | Disposition: A | Discharge status: Home / Self Care | Payer: 59 | Source: Ambulatory Visit | Attending: Radiation Oncology | Admitting: Radiation Oncology

## 2021-10-06 ENCOUNTER — Inpatient Hospital Stay: Payer: 59

## 2021-10-06 DIAGNOSIS — Z510 Encounter for antineoplastic radiation therapy: Secondary | ICD-10-CM | POA: Diagnosis not present

## 2021-10-07 ENCOUNTER — Inpatient Hospital Stay: Payer: 59

## 2021-10-07 ENCOUNTER — Ambulatory Visit
Admission: RE | Admit: 2021-10-07 | Discharge: 2021-10-07 | Disposition: A | Discharge status: Home / Self Care | Payer: 59 | Source: Ambulatory Visit | Attending: Radiation Oncology | Admitting: Radiation Oncology

## 2021-10-07 DIAGNOSIS — Z510 Encounter for antineoplastic radiation therapy: Secondary | ICD-10-CM | POA: Diagnosis not present

## 2021-10-08 ENCOUNTER — Other Ambulatory Visit: Payer: Self-pay

## 2021-10-08 ENCOUNTER — Ambulatory Visit
Admission: RE | Admit: 2021-10-08 | Discharge: 2021-10-08 | Disposition: A | Discharge status: Home / Self Care | Payer: 59 | Source: Ambulatory Visit | Attending: Radiation Oncology | Admitting: Radiation Oncology

## 2021-10-08 ENCOUNTER — Encounter: Payer: Self-pay | Admitting: Oncology

## 2021-10-08 ENCOUNTER — Inpatient Hospital Stay: Payer: 59

## 2021-10-08 ENCOUNTER — Ambulatory Visit: Payer: 59

## 2021-10-08 ENCOUNTER — Inpatient Hospital Stay (HOSPITAL_BASED_OUTPATIENT_CLINIC_OR_DEPARTMENT_OTHER): Payer: 59 | Admitting: Oncology

## 2021-10-08 VITALS — BP 137/98 | HR 139 | Temp 96.9°F | Resp 18 | Wt 146.6 lb

## 2021-10-08 DIAGNOSIS — R29898 Other symptoms and signs involving the musculoskeletal system: Secondary | ICD-10-CM | POA: Diagnosis not present

## 2021-10-08 DIAGNOSIS — C3412 Malignant neoplasm of upper lobe, left bronchus or lung: Secondary | ICD-10-CM

## 2021-10-08 DIAGNOSIS — C3490 Malignant neoplasm of unspecified part of unspecified bronchus or lung: Secondary | ICD-10-CM | POA: Diagnosis not present

## 2021-10-08 DIAGNOSIS — Z510 Encounter for antineoplastic radiation therapy: Secondary | ICD-10-CM | POA: Diagnosis not present

## 2021-10-08 LAB — CBC WITH DIFFERENTIAL/PLATELET
Abs Immature Granulocytes: 0.03 10*3/uL (ref 0.00–0.07)
Basophils Absolute: 0 10*3/uL (ref 0.0–0.1)
Basophils Relative: 0 %
Eosinophils Absolute: 0 10*3/uL (ref 0.0–0.5)
Eosinophils Relative: 1 %
HCT: 39.1 % (ref 39.0–52.0)
Hemoglobin: 12.5 g/dL — ABNORMAL LOW (ref 13.0–17.0)
Immature Granulocytes: 1 %
Lymphocytes Relative: 24 %
Lymphs Abs: 1.4 10*3/uL (ref 0.7–4.0)
MCH: 28.7 pg (ref 26.0–34.0)
MCHC: 32 g/dL (ref 30.0–36.0)
MCV: 89.9 fL (ref 80.0–100.0)
Monocytes Absolute: 0.6 10*3/uL (ref 0.1–1.0)
Monocytes Relative: 11 %
Neutro Abs: 3.8 10*3/uL (ref 1.7–7.7)
Neutrophils Relative %: 63 %
Platelets: 469 10*3/uL — ABNORMAL HIGH (ref 150–400)
RBC: 4.35 MIL/uL (ref 4.22–5.81)
RDW: 13.5 % (ref 11.5–15.5)
WBC: 5.9 10*3/uL (ref 4.0–10.5)
nRBC: 0 % (ref 0.0–0.2)

## 2021-10-08 LAB — COMPREHENSIVE METABOLIC PANEL WITH GFR
ALT: 16 U/L (ref 0–44)
AST: 19 U/L (ref 15–41)
Albumin: 3.5 g/dL (ref 3.5–5.0)
Alkaline Phosphatase: 103 U/L (ref 38–126)
Anion gap: 10 (ref 5–15)
BUN: 12 mg/dL (ref 6–20)
CO2: 29 mmol/L (ref 22–32)
Calcium: 9.4 mg/dL (ref 8.9–10.3)
Chloride: 94 mmol/L — ABNORMAL LOW (ref 98–111)
Creatinine, Ser: 1.26 mg/dL — ABNORMAL HIGH (ref 0.61–1.24)
GFR, Estimated: >60 mL/min
Glucose, Bld: 186 mg/dL — ABNORMAL HIGH (ref 70–99)
Potassium: 3.3 mmol/L — ABNORMAL LOW (ref 3.5–5.1)
Sodium: 133 mmol/L — ABNORMAL LOW (ref 135–145)
Total Bilirubin: 0.5 mg/dL (ref 0.3–1.2)
Total Protein: 8.4 g/dL — ABNORMAL HIGH (ref 6.5–8.1)

## 2021-10-08 NOTE — Progress Notes (Addendum)
Nutrition Follow-up:  Patient with recurrent metastatic adenocarcinoma of lung.  Large tumor found in cervical spine with mass displacing the left pharynx, s/p surgery.  Patient receiving radiation.   Met with patient following MD visit.  Patient reports no hunger.  Eating about 2 meals per day.  States that he knows he needs to eat more.  Drinking 1-2 shakes per day.  Denies nausea.  Does have some dry mouth in the mornings. Patient reports ate sausage sandwich today before coming.  Does not like cheese.  Last night ate popcorn shrimp and few hushpuppies    Medications: reviewed  Labs: reviewed  Anthropometrics:   Weight 146 lb today  153 lb on 9/21 174 lb on 7/26   NUTRITION DIAGNOSIS: Inadequate oral intake continues   INTERVENTION:  Patient states that he is going to eat 3 meals per day by the clock vs waiting on hunger cues. Says that he does not do anything but sit around in house. Planning to start PT soon. Reviewed ways to add moisture to foods for ease of swallowing. Discussed strategies to help with dry mouth.  Complimentary case of ensure enlive given to patient today.  Patient wants to drink 3 per day   MONITORING, EVALUATION, GOAL: weight trends, intake   NEXT VISIT: to be determined  Kyle Guerrero, [place], [place] Registered Dietitian [phone] (mobile)

## 2021-10-08 NOTE — Progress Notes (Signed)
Pt states he is having trouble swallowing solids more often now.

## 2021-10-11 ENCOUNTER — Ambulatory Visit
Admission: RE | Admit: 2021-10-11 | Discharge: 2021-10-11 | Disposition: A | Discharge status: Home / Self Care | Payer: 59 | Source: Ambulatory Visit | Attending: Radiation Oncology | Admitting: Radiation Oncology

## 2021-10-11 ENCOUNTER — Other Ambulatory Visit: Payer: Self-pay | Admitting: *Deleted

## 2021-10-11 DIAGNOSIS — C3490 Malignant neoplasm of unspecified part of unspecified bronchus or lung: Secondary | ICD-10-CM

## 2021-10-11 DIAGNOSIS — Z510 Encounter for antineoplastic radiation therapy: Secondary | ICD-10-CM | POA: Diagnosis not present

## 2021-10-12 ENCOUNTER — Ambulatory Visit: Payer: 59 | Attending: Oncology | Admitting: Physical Therapy

## 2021-10-12 ENCOUNTER — Encounter: Payer: Self-pay | Admitting: Physical Therapy

## 2021-10-12 DIAGNOSIS — R29898 Other symptoms and signs involving the musculoskeletal system: Secondary | ICD-10-CM | POA: Insufficient documentation

## 2021-10-12 DIAGNOSIS — M6281 Muscle weakness (generalized): Secondary | ICD-10-CM | POA: Diagnosis present

## 2021-10-12 NOTE — Therapy (Signed)
[facility] [address], [place], [address] Phone: [phone]   Fax:  [phone]  Physical Therapy Evaluation  Patient Details  Name: Kyle Guerrero MRN: [id number] Date of Birth: Apr 21, 1971 No data recorded  Encounter Date: 10/12/2021   PT End of Session - 10/12/21 1349     Visit Number 1    Number of Visits 17    Date for PT Re-Evaluation 12/07/21    Authorization - Visit Number 1    Progress Note Due on Visit 10    PT Start Time 0900    PT Stop Time 0945    PT Time Calculation (min) 45 min    Activity Tolerance Patient tolerated treatment well;Patient limited by pain    Behavior During Therapy [facility] for tasks assessed/performed             Past Medical History:  Diagnosis Date   Asthma    Lung cancer ([place])     Past Surgical History:  Procedure Laterality Date   CYST EXCISION     IR IMAGING GUIDED PORT INSERTION  12/05/2019   VIDEO BRONCHOSCOPY WITH ENDOBRONCHIAL ULTRASOUND Left 11/22/2019   Procedure: VIDEO BRONCHOSCOPY WITH ENDOBRONCHIAL ULTRASOUND;  Surgeon: Tyler Pita, MD;  Location: [facility] ORS;  Service: Thoracic;  Laterality: Left;    There were no vitals filed for this visit.    Subjective Assessment - 10/12/21 0786     Subjective Kyle Guerrero is a 50 y.o.male presenting to therapy with primary c/o L UE weakness. He reports increase in weakness began about 12 weeks ago.  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 Pt reports having last radiation treatment yesterday (10/11/21). Pt reports complete inability to lift L dominant arm to perform functional tasks to aid in dressing, eating, batheing, and grooming hair. He reports function of his tricep but has the inability to curl. Pt reports constant pain in L UE and rates his pain 4/10  today and 6/10 at worst. Patient has a PMH of non-small cell lung cancer, chemotherapy tx, and previous history of L UE weakness. Pt denies N/V, B&B changes, unexplained weight fluctuation, saddle paresthesia, fever, night sweats, or unrelenting night pain at this time.    Pertinent History Kyle Guerrero is a 50 y.o.male presenting to therapy with primary c/o L UE weakness. He reports increase in weakness began about 12 weeks ago.  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 Marland Kitchen Pt reports having last radiation treatment yesterday (10/11/21). Pt reports complete inability to lift L dominant arm to perform functional tasks to aid in dressing, eating, batheing, and grooming hair. He reports function of his tricep but has the inability to curl. Pt reports constant pain in L UE and rates his pain 4/10 today and 6/10 at worst. Patient has a PMH of non-small cell lung cancer since 11/2019, chemotherapy tx, and previous history of L UE weakness familiar with this clinic d/t prior PT for L shoulder pain following accident at work. Pt denies N/V, B&B changes, unexplained weight fluctuation, saddle paresthesia, fever, night sweats, or unrelenting night pain at this time.    Limitations Lifting;House hold activities    Diagnostic tests  10/04/21 CERVICAL MRI Extensive postoperative changes in the cervical spine. Necrotic  paraspinal tumor has greatly progressed from a 05/05/2021 neck CT  but is similar in extent to a more recent cervical spine MRI  similar.07/20/21 PET SCAN The left paravertebral mass with destructive findings associated  with C4, C5, and C6 demonstrates substantial reduction from the  original PET-CT, currently 49 cubic cm and previously 200 cubic cm.  Activity has also reduced, with evidence of central necrosis and a   current maximum SUV of 11.2, formerly 20.3.    Patient Stated Goals Regain strength in L arm    Currently in Pain? Yes    Pain Score 4     Pain Location Arm    Pain Orientation Left    Pain Descriptors / Indicators Aching;Dull    Pain Type Chronic pain    Pain Onset More than a month ago    Pain Frequency Constant    Aggravating Factors  laying on it and pressure    Pain Relieving Factors muscle relaxers and oxycondeine    Effect of Pain on Daily Activities unable to lift arm              Cervical AROM L/R Flexion: 18 deg Extension: 20 deg Lateral Flexion: limited approximately 90 % Rotation: 20/20 deg Pt notes no pain with cervical AROM, post surgical stiffness   Shoulder AROM L/R  Flexion: 0/ 170 deg  Abduction: 0/170deg  IR: approximately PSIS/ t7deg approx  ER: unable to perfom/ c7 deg approx  *Pt denies pain with motions, reports his arm just feels "heavy like it won't work"  Scapular AROM:  Patient was able to perform scapular elevation, protraction, and retraction WNL with evident scapular winging on L with protraction/retraction  L Shoulder PROM Flex 143 deg ABD 114 deg ER: 85 deg IR: 78 deg  *Motions end feels are empty and restricted by pain, pt very guarded throughout with multimodal cues to "relax"  Shoulder Strength MMT L/R Flexion: 1/5; 4+/5 Abduction:1/5; 4+/5 IR:1/5; 4/5 ER: 1/5; 4/5  Elbow MMT L/R: Flex:1/4+ Ext: 2/4+  *Trace muscle activation for all, inability to achieve any test position  L Elbow PROM  WNL  Passive Accessory Motion of Glenohumeral Joint:  Inferior: 3cm sulcu sign Lateral and Posterior with soft end feel, increased laxity noted, no increased pain   Observation:  Posture: standing with decreased L shoulder height, rounded shoulders with increased thoracic kyphosis  Palpation: Hypersensitivity to light palpation to pec minor, UT, levator, RTC group, bicep, and tricep. Noted atrophy esp. At pec minor/major,  bicep, UT  Sulcus sign: L shoulder 3 cm in sitting   L UE Reflexes Biceps: 4 Tricep: absent Brachioradialis: absent   Sensation: appeared intact on R UE; L UE deferred to next session      Objective measurements completed on examination: See above findings.    PT reviewed the following HEP with patient with patient able to demonstrate a set of the following with min cuing for correction needed. PT educated patient on parameters of therex (how/when to inc/decrease intensity, frequency, rep/set range, stretch hold time, and purpose of therex) with verbalized understanding.  Supine with LUE supported by mat table AAROM with dowel ABD x 12 reps 3x/day Supine with LUE supported by mat table AAROM with dowel ER <> IR  x 12 reps 3x/day Seated shrugs 2-3 x10 daily Seated scapular retractions 2-3 x10 daily              PT Education -  10/12/21 1338     Education Details Patient was educated on diagnosis, anatomy and pathology involved, prognosis, role of PT, and was given an HEP, demonstrating exercise with proper form following verbal and tactile cues, and was given a paper hand out to continue exercise at home. Pt was educated on and agreed to plan of care.    Person(s) Educated Patient    Methods Explanation;Demonstration    Comprehension Verbalized understanding;Returned demonstration              PT Short Term Goals - 10/12/21 1339       PT SHORT TERM GOAL #1   Title Pt will be independent with HEP in order to improve L UE strength and ROM in order to improve function with ADLS at home and community    Baseline 10/12/20 HEP given    Time 4    Period Weeks    Status New    Target Date 11/09/21      PT SHORT TERM GOAL #2   Title Pt will demonstrate full passive L shoulder ROM to demonstrate PLOF joint mobility to allow for full AROM    Baseline 10/12/21 L shoulder flex: 143 abd 114 ER 85 IR 78    Time 4    Period Weeks    Status New               PT  Long Term Goals - 10/12/21 1340       PT LONG TERM GOAL #1   Title Patient will increase FOTO score to 58 to demonstrate predicted increase in functional mobility to complete ADLs    Baseline 10/12/21: 44    Time 8    Period Weeks    Status New    Target Date 12/07/21      PT LONG TERM GOAL #2   Title Patient will demonstrate at least 3/5 gross L shoulder strength and periscapular strength to demonstrate and strength needed for light household chores and ADLS    Baseline 16/10/96: 1/5    Time 8    Period Weeks    Status New    Target Date 12/07/21      PT LONG TERM GOAL #3   Title Pt will demostrate L shoulder AROM within 120 degress flexion and abduction in order to reach objects above shoulder height.    Baseline 10/12/21 No available L shoulder ROM    Period Weeks    Status New      PT LONG TERM GOAL #4   Title Pt will decrease worst pain as reported on NPRS by at least 3 points in order to demonstrate clinically significant reduction in pain.    Baseline 10/12/21 6/10    Time 8    Period Weeks    Status New                    Plan - 10/12/21 1311     Clinical Impression Statement Patient is a 50 year old male presenting with L UE weakness and dyfunction following cervical carcinoma treatment (radiation and tumer resection) and C3-7 ACDF and C-6 corpectomy. Impairments decreased shoulder and periscapular strength secondary to absence of muscle activation d/t compromised neuro pathways, L sided muscle atrophy, hypersensitivity, pain, abnormal joint mechanics, and decreased activity tolerance. Activity limitations in all lifting, carrying, pushing, grasping, and pulling; inhibiting full participation in ADLs such as driving, dressing, grooming, and bathing. Pt will benefit from skilled PT to address impairments to return to  ind PLOF. Patient is at increased risk of L shoulder subluxation and was encouraged to support L shoulder as often as possible with verbalized  understanding of this and HEP education.    Personal Factors and Comorbidities Comorbidity 1;Fitness;Comorbidity 2;Profession;Time since onset of injury/illness/exacerbation;Past/Current Experience    Comorbidities asthma, non small cell lung cancer    Examination-Activity Limitations Bathing;Reach Overhead;Lift;Sleep;Carry;Bed Mobility    Examination-Participation Restrictions Community Activity;Cleaning;Driving    Stability/Clinical Decision Making Evolving/Moderate complexity    Clinical Decision Making High    Rehab Potential Poor    PT Frequency 2x / week    PT Duration 8 weeks    PT Treatment/Interventions ADLs/Self Care Home Management;Cryotherapy;Iontophoresis 4mg /ml Dexamethasone;Functional mobility training;Patient/family education;Manual techniques;Joint Manipulations;Spinal Manipulations;Dry needling;Manual lymph drainage;Taping;Therapeutic exercise;Gait training;Traction;Ultrasound;Scientist, product/process development;Therapeutic activities;Neuromuscular re-education    PT Next Visit Plan review HEP    PT Home Exercise Plan AAROM ABD and ER    Consulted and Agree with Plan of Care Patient             Patient will benefit from skilled therapeutic intervention in order to improve the following deficits and impairments:  Decreased mobility, Increased muscle spasms, Impaired sensation, Improper body mechanics, Impaired tone, Decreased range of motion, Decreased strength, Increased fascial restricitons, Impaired flexibility, Impaired UE functional use, Postural dysfunction, Pain, Decreased activity tolerance  Visit Diagnosis: Muscle weakness of left upper extremity     Problem List Patient Active Problem List   Diagnosis Date Noted   Weakness of left arm 07/10/2020   Goals of care, counseling/discussion 11/29/2019   Malignant neoplasm of upper lobe of left lung ([place]) 11/29/2019   Non-small cell cancer of left lung ([place]) 11/29/2019   Hemoptysis 11/29/2019     Kyle Guerrero Sharion Settler, SPT  Kyle Reges, PT 10/12/2021, 2:45 PM  [facility] [address] [place], [address] Phone: [phone]   Fax:  [phone]  Name: SANTHIAGO COLLINGSWORTH MRN: [id number] Date of Birth: 1971-07-06

## 2021-10-14 ENCOUNTER — Encounter: Payer: Self-pay | Admitting: Physical Therapy

## 2021-10-14 ENCOUNTER — Ambulatory Visit: Payer: 59 | Admitting: Physical Therapy

## 2021-10-14 ENCOUNTER — Encounter: Payer: Self-pay | Admitting: Oncology

## 2021-10-14 DIAGNOSIS — M6281 Muscle weakness (generalized): Secondary | ICD-10-CM | POA: Diagnosis not present

## 2021-10-14 NOTE — Therapy (Addendum)
[facility] [address] [place], [address] Phone: [phone]   Fax:  [phone]  Physical Therapy Treatment  Patient Details  Name: Kyle Guerrero MRN: [id number] Date of Birth: 06-09-1971 No data recorded  Encounter Date: 10/14/2021     Past Medical History:  Diagnosis Date   Asthma    Lung cancer [facility])     Past Surgical History:  Procedure Laterality Date   CYST EXCISION     IR IMAGING GUIDED PORT INSERTION  12/05/2019   VIDEO BRONCHOSCOPY WITH ENDOBRONCHIAL ULTRASOUND Left 11/22/2019   Procedure: VIDEO BRONCHOSCOPY WITH ENDOBRONCHIAL ULTRASOUND;  Surgeon: Tyler Pita, MD;  Location: [facility] ORS;  Service: Thoracic;  Laterality: Left;    There were no vitals filed for this visit.    Objective:  L MMT Wrist  Flex 3+/5 ext 3-/5  Grip Strength: L:38 lb R: 85lb   Dermatome testing: Little to no sensation to C5/C6/C7 Decreased sensation to C8/T1 Hypersensitive @ dermatome T2 Sensation C1-C4 normal  Myotome testing: Absent C5/C6 Nearly absent C7/C8 T1 seems to be intact (thumb opposition) T2 and below intact  Proprioception: Intact for thumb and shoulder  Therex   AAROM with shoulder ABD x 12 reps  Seated Scapular Retraction 2 x 12 reps with tactile cues to encourage movement  Seated Scapular elevation and depression 2 x 12 reps with verbal and tactile cues   Scapular Protraction in side lying 2 x 12 reps in gravity eliminated position with PT assisting with verbal/tactile cues  Supine L Triceps extension AROM with therapist to assist with elbow flex: x 12 reps                   PT Short Term Goals - 10/12/21 1339       PT SHORT TERM GOAL #1   Title Pt will be independent with HEP in order to improve L UE strength and ROM in order to improve function with ADLS at home and community    Baseline 10/12/20 HEP given    Time 4    Period Weeks     Status New    Target Date 11/09/21      PT SHORT TERM GOAL #2   Title Pt will demonstrate full passive L shoulder ROM to demonstrate PLOF joint mobility to allow for full AROM    Baseline 10/12/21 L shoulder flex: 143 abd 114 ER 85 IR 78    Time 4    Period Weeks    Status New               PT Long Term Goals - 10/15/21 0945       PT LONG TERM GOAL #1   Title Patient will increase FOTO score to 58 to demonstrate predicted increase in functional mobility to complete ADLs    Baseline 10/12/21: 44    Time 8    Period Weeks    Status New      PT LONG TERM GOAL #2   Title Patient will demonstrate at least 3/5 gross L shoulder strength and periscapular strength to demonstrate and strength needed for light household chores and ADLS    Baseline 94/70/96: 1/5    Time 8    Period Weeks    Status New      PT LONG TERM GOAL #3   Title Pt will demostrate L shoulder AROM within 120 degress flexion and abduction in order to reach objects above shoulder  height.    Baseline 10/12/21 No available L shoulder ROM    Time 8    Period Weeks    Status New      PT LONG TERM GOAL #4   Title Pt will decrease worst pain as reported on NPRS by at least 3 points in order to demonstrate clinically significant reduction in pain.    Baseline 10/12/21 6/10    Time 8    Period Weeks    Status New      PT LONG TERM GOAL #5   Title Pt iwll demonstrate L grip strength of at least 76.5lbs in order to demonstrate 90% of PLOF in order to complete heavy household ADLs    Baseline 10/14/21 L 38lb R 85lb    Time 8    Period Weeks    Status New                     Patient will benefit from skilled therapeutic intervention in order to improve the following deficits and impairments:  Decreased mobility, Increased muscle spasms, Impaired sensation, Improper body mechanics, Impaired tone, Decreased range of motion, Decreased strength, Increased fascial restricitons, Impaired flexibility,  Impaired UE functional use, Postural dysfunction, Pain, Decreased activity tolerance  Visit Diagnosis: Muscle weakness of left upper extremity     Problem List Patient Active Problem List   Diagnosis Date Noted   Weakness of left arm 07/10/2020   Goals of care, counseling/discussion 11/29/2019   Malignant neoplasm of upper lobe of left lung ([place]) 11/29/2019   Non-small cell cancer of left lung ([place]) 11/29/2019   Hemoptysis 11/29/2019     Durwin Reges DPT Sharion Settler, SPT  Durwin Reges, PT 10/18/2021, 11:27 AM  [facility] [address], [place], [address] Phone: [phone]   Fax:  [phone]  Name: Kyle Guerrero MRN: [id number] Date of Birth: [birth year]

## 2021-10-14 NOTE — Progress Notes (Signed)
Hematology/Oncology Consult note [facility]  Telephone:([phone] [phone]  Patient Care Team: Default, Provider, MD as PCP - General Telford Nab, RN as Registered Nurse Sindy Guadeloupe, MD as Consulting Physician (Hematology and Oncology) Sindy Guadeloupe, MD as Consulting Physician (Hematology and Oncology)   Name of the patient: Kyle Guerrero  [id number]  1971/05/10   Date of visit: 10/14/21  Diagnosis- Non-small cell lung cancer stage IV acT2 cN2 cM1 a with pleural involvement  Chief complaint/ Reason for visit-discuss CT scan results and further management  Heme/Onc history: patient is a 50 year old male who presented to the ER with symptoms of heaviness in his chest and upper left chest discomfort he underwent CT angio chest to rule out PE which showed 3.8 x 3.3 cm left upper palpable lung mass along with 4.4 x 3.3 cm lobulated mass in the aortopulmonary window and 2.6 x 2.4 cm left hilar mass all concerning for malignancy.  Patient has also seen pulmonary and has been set up for bronchoscopy and EBUS guided biopsy on 11/23/2019.  Patient underwent.  PET CT scan which showed a hypermetabolic spiculated 3.5 cm of 5 left upper lobe lung mass, adjacent hypermetabolic 3.2 x 1.2 cm pleural metastases in the medial posterior by the left pleural space along with scalloping of the adjacent posterior left third rib.  Hypermetabolic infiltrative left perihilar conglomerate nodal metastases measuring up to 7.3 x 3.6 cm and 0.8 cm high left mediastinal node between the left brachiocephalic vein and left subclavian artery.  No evidence of distant metastatic disease   Biopsy showed non-small cell lung cancer but further characterization could not be determined.  Insufficient tissue for NGS testing. Repeat biopsy done. Results of NGS testing showed PD-L1 50%.  Tumor mutational burden high.  ERBB2 copy number again. 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    Patient completed concurrent chemoradiation with carbotaxol chemotherapy followed by 2 cycles of carbotaxol Keytruda and was on maintenance Keytruda   Patient was complaining of left shoulder pain and underwent MRI of the shoulder which showed tear in the supraspinatus tendon.  He was subsequently seen by [facility] and underwent MRI of the cervical spine which showed a 4.6 x 4.8 x 8.2 cm mass in the left prevertebral region from C2-C7 levels.  The mass partially encases the left vertebral artery and abuts the preforaminal segment.  Invades the vertebral bodies and edema of the left C5 articular pillar.  There is slight extension into the left C4-C5 thecal sac without central thecal sac compression.  The mass displaces the left pharynx anteriorly along the left carotid sheath.  Patient had a repeat biopsy which was consistent with adenocarcinoma.   Patient underwent palliative radiation treatment to his neck mass and completed 4 cycles of carboplatin and [name] with excellent response to treatment and is currently on maintenance [name].  Repeat NGS testing was also performed which did not show any evidence of actionable mutations other than ERBB2 gain   Patient had recurrence of his neck pain and repeat imaging showedInterim increase in the size of his neck mass and patient went for second opinion for both medical oncology as well as neurosurgery continue current was decided to cooperate upon this mass for definitive resection.  Patient underwent resection of cervical tumor which showed poorly differentiated metastatic adenocarcinoma on 08/29/2021.  TTF-1 negative.     Interval history-patient reports significant improvement in his neck pain.  Still has significant weakness in his  left upper extremity.  He has not started physical therapy yet.  Completes radiation treatment later this week.  ECOG PS- 1 Pain scale- 2 Opioid associated constipation- no  Review of systems- Review of  Systems  Constitutional:  Positive for malaise/fatigue. Negative for chills, fever and weight loss.  HENT:  Negative for congestion, ear discharge and nosebleeds.   Eyes:  Negative for blurred vision.  Respiratory:  Negative for cough, hemoptysis, sputum production, shortness of breath and wheezing.   Cardiovascular:  Negative for chest pain, palpitations, orthopnea and claudication.  Gastrointestinal:  Negative for abdominal pain, blood in stool, constipation, diarrhea, heartburn, melena, nausea and vomiting.  Genitourinary:  Negative for dysuria, flank pain, frequency, hematuria and urgency.  Musculoskeletal:  Negative for back pain, joint pain and myalgias.  Skin:  Negative for rash.  Neurological:  Negative for dizziness, tingling, focal weakness, seizures, weakness and headaches.       Left upper extremity weakness  Endo/Heme/Allergies:  Does not bruise/bleed easily.  Psychiatric/Behavioral:  Negative for depression and suicidal ideas. The patient does not have insomnia.       No Known Allergies   Past Medical History:  Diagnosis Date   Asthma    Lung cancer ([place])      Past Surgical History:  Procedure Laterality Date   CYST EXCISION     IR IMAGING GUIDED PORT INSERTION  12/05/2019   VIDEO BRONCHOSCOPY WITH ENDOBRONCHIAL ULTRASOUND Left 11/22/2019   Procedure: VIDEO BRONCHOSCOPY WITH ENDOBRONCHIAL ULTRASOUND;  Surgeon: Tyler Pita, MD;  Location: [facility] ORS;  Service: Thoracic;  Laterality: Left;    Social History   Socioeconomic History   Marital status: Single    Spouse name: Not on file   Number of children: Not on file   Years of education: Not on file   Highest education level: Not on file  Occupational History   Not on file  Tobacco Use   Smoking status: Former    Packs/day: 2.00    Types: Cigarettes    Quit date: 11/08/2019    Years since quitting: 1.9   Smokeless tobacco: Never  Vaping Use   Vaping Use: Never used  Substance and Sexual Activity    Alcohol use: Not Currently   Drug use: Yes    Types: Marijuana   Sexual activity: Not on file  Other Topics Concern   Not on file  Social History Narrative   Not on file   Social Determinants of Health   Financial Resource Strain: Not on file  Food Insecurity: Not on file  Transportation Needs: Not on file  Physical Activity: Not on file  Stress: Not on file  Social Connections: Not on file  Intimate Partner Violence: Not on file    Family History  Problem Relation Age of Onset   Healthy Mother    Healthy Father      Current Outpatient Medications:    baclofen (LIORESAL) 10 MG tablet, TAKE 1 TABLET BY MOUTH THREE TIMES A DAY, Disp: 90 tablet, Rfl: 1   dexamethasone (DECADRON) 1 MG tablet, PLEASE SEE ATTACHED FOR DETAILED DIRECTIONS, Disp: , Rfl:    loratadine (CLARITIN) 10 MG tablet, Take 10 mg by mouth as directed. 1 tablet starting with each chemo for 1 week, Disp: , Rfl:    MIRALAX 17 g packet, Take 1 packet by mouth daily., Disp: , Rfl:    oxyCODONE (OXY IR/ROXICODONE) 5 MG immediate release tablet, Take 1 tablet (5 mg total) by mouth every 4 (four) hours as  needed for moderate pain or severe pain., Disp: 180 tablet, Rfl: 0   pantoprazole (PROTONIX) 20 MG tablet, Take 1 tablet (20 mg total) by mouth daily., Disp: 30 tablet, Rfl: 1   potassium chloride (KLOR-CON) 20 MEQ packet, Take 20 mEq by mouth daily., Disp: 10 packet, Rfl: 0   pregabalin (LYRICA) 75 MG capsule, Take 1 capsule (75 mg total) by mouth 2 (two) times daily., Disp: 60 capsule, Rfl: 0   SENEXON-S 8.6-50 MG tablet, Take 1 tablet by mouth daily., Disp: , Rfl:    sucralfate (CARAFATE) 1 g tablet, Take 1 tablet (1 g total) by mouth 3 (three) times daily. Dissolve in 3-4 tbsp warm water, swish and swallow, Disp: 30 tablet, Rfl: 0   azelastine (ASTELIN) 0.1 % nasal spray, Place into the nose. (Patient not taking: No sig reported), Disp: , Rfl:    dexamethasone (DECADRON) 4 MG tablet, Take 1 tablet (4 mg total) by  mouth 3 (three) times daily. (Patient not taking: No sig reported), Disp: 30 tablet, Rfl: 0   pantoprazole (PROTONIX) 40 MG tablet, Take 40 mg by mouth daily. (Patient not taking: No sig reported), Disp: , Rfl:  No current facility-administered medications for this visit.  Facility-Administered Medications Ordered in Other Visits:    heparin lock flush 100 unit/mL, 500 Units, Intravenous, Once, Sindy Guadeloupe, MD   heparin lock flush 100 unit/mL, 500 Units, Intravenous, Once, Sindy Guadeloupe, MD   sodium chloride flush (NS) 0.9 % injection 10 mL, 10 mL, Intravenous, PRN, Sindy Guadeloupe, MD, 10 mL at 05/01/20 0900   sodium chloride flush (NS) 0.9 % injection 10 mL, 10 mL, Intravenous, PRN, Sindy Guadeloupe, MD, 10 mL at 01/29/21 0835  Physical exam:  Vitals:   10/08/21 1327  BP: (!) 137/98  Pulse: (!) 139  Resp: 18  Temp: (!) 96.9 F (36.1 C)  SpO2: 100%  Weight: 146 lb 9.6 oz (66.5 kg)   Physical Exam Constitutional:      General: He is not in acute distress. Neck:     Comments: Cervical collar in place Cardiovascular:     Rate and Rhythm: Normal rate and regular rhythm.     Heart sounds: Normal heart sounds.  Pulmonary:     Effort: Pulmonary effort is normal.     Breath sounds: Normal breath sounds.  Abdominal:     General: Bowel sounds are normal.     Palpations: Abdomen is soft.  Musculoskeletal:     Comments: Power LUE 2/5  Skin:    General: Skin is warm and dry.  Neurological:     Mental Status: He is alert and oriented to person, place, and time.     CMP Latest Ref Rng & Units 10/08/2021  Glucose 70 - 99 mg/dL 186(H)  BUN 6 - 20 mg/dL 12  Creatinine 0.61 - 1.24 mg/dL 1.26(H)  Sodium 135 - 145 mmol/L 133(L)  Potassium 3.5 - 5.1 mmol/L 3.3(L)  Chloride 98 - 111 mmol/L 94(L)  CO2 22 - 32 mmol/L 29  Calcium 8.9 - 10.3 mg/dL 9.4  Total Protein 6.5 - 8.1 g/dL 8.4(H)  Total Bilirubin 0.3 - 1.2 mg/dL 0.5  Alkaline Phos 38 - 126 U/L 103  AST 15 - 41 U/L 19  ALT 0 -  44 U/L 16   CBC Latest Ref Rng & Units 10/08/2021  WBC 4.0 - 10.5 K/uL 5.9  Hemoglobin 13.0 - 17.0 g/dL 12.5(L)  Hematocrit 39.0 - 52.0 % 39.1  Platelets 150 - 400  K/uL 469(H)    No images are attached to the encounter.  CT SOFT TISSUE NECK W CONTRAST  Result Date: 10/05/2021 CLINICAL DATA:  Restaging of metastatic lung cancer. EXAM: CT NECK WITH CONTRAST TECHNIQUE: Multidetector CT imaging of the neck was performed using the standard protocol following the bolus administration of intravenous contrast. CONTRAST:  127m OMNIPAQUE IOHEXOL 350 MG/ML SOLN COMPARISON:  Neck CT 05/05/2021.  Cervical spine MRI 08/28/2021. FINDINGS: Pharynx and larynx: Similar appearance of generalized mucosal edema compatible with prior radiation therapy. Small retropharyngeal effusion, smaller than on the MRI. Salivary glands: Post radiation changes most notably involving the left submandibular gland. Thyroid: Unremarkable. Lymph nodes: No discrete enlarged lymph nodes in the neck. Vascular: Major vascular structures of the neck remain grossly patent including the left vertebral artery which is partially in case by the mass. Limited intracranial: Unremarkable. Visualized orbits: Unremarkable. Mastoids and visualized paranasal sinuses: Mild right greater than left ethmoid air cell mucosal thickening. Clear mastoid air cells. Skeleton: C4-C6 corpectomy and C3-C7 anterior fusion, new from the prior neck CT though present on the interval MRI. Interval C2-T2 posterior fusion with articular pillar screws present at every level on the right except C7 and with absent left-sided screws C4, C5, and C7. Posterior bone graft material in place bilaterally. Destructive changes involving residual left-sided posterior elements at C4, C5 and C6, new from the prior neck CT and but evident on the interval MRI. Centrally low-density material with thick peripheral enhancement in the left paravertebral region extending from approximately C3-T1  with associated osseous erosion measures approximately 4.8 x 3.7 x 7.4 cm (AP x transverse x craniocaudal) and is consistent with previously shown necrotic tumor as well as potentially postoperative fluid medially where there has presumably been some tumor debulking. Overall, this tumor has greatly progressed from the prior neck CT but is more similar in extent to the more recent cervical spine MRI. A smaller amount of low-density, peripherally enhancing material in the right paravertebral region is new from the prior neck CT but also similar to the spine MRI. Assessment for epidural tumor is limited by streak artifact. A postoperative subcutaneous fluid collection in the posterior midline of the neck extends from C6-T2 measuring 1.6 cm AP x 8.3 cm craniocaudal. Upper chest: Reported separately. Other: None. IMPRESSION: 1. Extensive postoperative changes in the cervical spine. Necrotic paraspinal tumor has greatly progressed from a 05/05/2021 neck CT but is similar in extent to a more recent cervical spine MRI similar. 2. No evidence of metastatic disease elsewhere in the neck. Electronically Signed   By: ALogan BoresM.D.   On: 10/05/2021 13:34   CT CHEST ABDOMEN PELVIS W CONTRAST  Result Date: 10/04/2021 CLINICAL DATA:  Left upper lobe lung cancer restaging EXAM: CT CHEST, ABDOMEN, AND PELVIS WITH CONTRAST TECHNIQUE: Multidetector CT imaging of the chest, abdomen and pelvis was performed following the standard protocol during bolus administration of intravenous contrast. CONTRAST:  1066mOMNIPAQUE IOHEXOL 350 MG/ML SOLN, additional oral enteric contrast COMPARISON:  PET-CT, 07/19/2021 FINDINGS: CT CHEST FINDINGS Cardiovascular: Right chest port catheter. Normal heart size. No pericardial effusion. Mediastinum/Nodes: No enlarged mediastinal, hilar, or axillary lymph nodes. Thyroid gland, trachea, and esophagus demonstrate no significant findings. Lungs/Pleura: Unchanged post treatment appearance of the  paramedian right upper lobe and perihilar left lung. Mild, diffuse bilateral bronchial wall thickening. No pleural effusion or pneumothorax. Musculoskeletal: No chest wall mass or suspicious bone lesions identified. CT ABDOMEN PELVIS FINDINGS Hepatobiliary: No solid liver abnormality is seen. No gallstones, gallbladder  wall thickening, or biliary dilatation. Pancreas: Unremarkable. No pancreatic ductal dilatation or surrounding inflammatory changes. Spleen: Normal in size without significant abnormality. Adrenals/Urinary Tract: Adrenal glands are unremarkable. Kidneys are normal, without renal calculi, solid lesion, or hydronephrosis. Bladder is unremarkable. Stomach/Bowel: Stomach is within normal limits. Appendix appears normal. No evidence of bowel wall thickening, distention, or inflammatory changes. Vascular/Lymphatic: No significant vascular findings are present. No enlarged abdominal or pelvic lymph nodes. Reproductive: No mass or other abnormality. Other: No abdominal wall hernia or abnormality. No abdominopelvic ascites. Musculoskeletal: No acute or significant osseous findings. IMPRESSION: 1. Unchanged post treatment appearance of the paramedian right upper lobe and perihilar left lung. 2. No evidence of recurrent or metastatic disease in the chest, abdomen, or pelvis. 3. Mild, diffuse bilateral bronchial wall thickening, consistent with nonspecific infectious or inflammatory bronchitis. 4. Please see separately reported examination of the neck. Electronically Signed   By: Delanna Ahmadi M.D.   On: 10/04/2021 20:35     Assessment and plan- Patient is a 50 y.o. male  with recurrent metastatic adenocarcinoma of the lung mainly with recurrence in the form of a large tumor involving the cervical spine s/p definitive surgery.  He is here to discuss CT scan results and further management  I have reviewed CT chest abdomen pelvis as well as CT soft tissue neck images independently and discussed findings with  the patient.  Despite surgery there is still significant residual tumor seen in his neck extending from C3-T1 measuring 4.8 x 3.7 x 7.4 cm.  Outside of his neck that is not much significant lung cancer seen anywhere with posttreatment changes in the paramedian right upper lobe lung.  Patient completes palliative radiation treatment this week.  He would like to proceed with physical therapy through call and I will make sure that that gets started ASAP.  I did discuss this case with Dr. Carlis Abbott from [facility] thoracic oncology as well.  Given that he still has residual tumor I think it would make sense to proceed with Enhertu at this time rather than wait for further progression.  I will plan to tentatively see him in 2 weeks to start Enhertu which will be given every 3 weeks until progression or toxicity.He will need a baseline echocardiogram prior to that.  In the [facility] [place] 01 study Enhertu was administered to 91 patients with HER2 positive non-small cell lung cancer that has progressed on standard treatment and median progression free survival was 8.2 months with overall survival of 17.38-monthand a response rate of 55%.  Discussed risks and benefits of the drug including all but not limited to skin rash diarrhea and possible cardiotoxicity.  Patient understands and agrees to proceed as planned.  I will see him back in 3 weeks time to start treatment. Treatment will be given with a palliative intent   Visit Diagnosis 1. Malignant neoplasm of lung, unspecified laterality, [place] part of lung [place]   2. Weakness of left upper extremity   3. Malignant ne[place] of upper lobe of left lung ([place]      Dr. ARanda Evens MD, MPH [id number]/20/2022 1:46 PM

## 2021-10-15 ENCOUNTER — Telehealth: Payer: Self-pay | Admitting: *Deleted

## 2021-10-15 ENCOUNTER — Other Ambulatory Visit: Payer: Self-pay | Admitting: *Deleted

## 2021-10-15 MED ORDER — PREGABALIN 75 MG PO CAPS: 75.0000 mg | ORAL_CAPSULE | Freq: Two times a day (BID) | ORAL | 2 refills | Status: DC

## 2021-10-15 MED ORDER — PREGABALIN 75 MG PO CAPS
75.0000 mg | ORAL_CAPSULE | Freq: Two times a day (BID) | ORAL | 2 refills | Status: DC
Start: 2021-10-15 — End: 2022-04-08

## 2021-10-15 NOTE — Telephone Encounter (Signed)
Updated medication list. Pt made aware of what still needs to continue taking. Refill of lyrica to be sent into [facility] pharmacy. Pt verbalized understanding.

## 2021-10-18 ENCOUNTER — Ambulatory Visit: Payer: 59 | Admitting: Physical Therapy

## 2021-10-18 ENCOUNTER — Encounter: Payer: Self-pay | Admitting: Physical Therapy

## 2021-10-18 ENCOUNTER — Telehealth: Payer: Self-pay | Admitting: *Deleted

## 2021-10-18 DIAGNOSIS — C3490 Malignant neoplasm of unspecified part of unspecified bronchus or lung: Secondary | ICD-10-CM

## 2021-10-18 DIAGNOSIS — M6281 Muscle weakness (generalized): Secondary | ICD-10-CM

## 2021-10-18 DIAGNOSIS — C7951 Secondary malignant neoplasm of bone: Secondary | ICD-10-CM

## 2021-10-18 NOTE — Telephone Encounter (Signed)
Call received from Durwin Reges, PT with [facility] asking to discuss with Dr Janese Banks this patient and where things stand with his therapy and the flaccidity of his left shoulder. She feels that the nerves are affected by his treatment and is asking if he should have a referral to Ortho and for him to get an EMG. She asks for return call Her cell # [phone], office [phone]

## 2021-10-18 NOTE — Therapy (Signed)
[facility] [address] [place], [address] Phone: [phone]   Fax:  [phone]  Physical Therapy Treatment  Patient Details  Name: Kyle Guerrero MRN: [id number] Date of Birth: 1971-04-13 No data recorded  Encounter Date: 10/18/2021   PT End of Session - 10/18/21 1033     Visit Number 3    Number of Visits 17    Date for PT Re-Evaluation 12/07/21    Authorization - Visit Number 3    Progress Note Due on Visit 10    PT Start Time 1030    PT Stop Time 1111    PT Time Calculation (min) 41 min    Activity Tolerance Patient tolerated treatment well    Behavior During Therapy [facility] for tasks assessed/performed             Past Medical History:  Diagnosis Date   Asthma    Lung cancer ([place])     Past Surgical History:  Procedure Laterality Date   CYST EXCISION     IR IMAGING GUIDED PORT INSERTION  12/05/2019   VIDEO BRONCHOSCOPY WITH ENDOBRONCHIAL ULTRASOUND Left 11/22/2019   Procedure: VIDEO BRONCHOSCOPY WITH ENDOBRONCHIAL ULTRASOUND;  Surgeon: Tyler Pita, MD;  Location: [facility] ORS;  Service: Thoracic;  Laterality: Left;    There were no vitals filed for this visit.   Subjective Assessment - 10/18/21 1029     Subjective Pt reports increase in pain and pain in mid back. He reports his pain 5/10 on NPRS.    Pertinent History Kyle Guerrero is a 50 y.o.male presenting to therapy with primary c/o L UE weakness. He reports increase in weakness began about 12 weeks ago.  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 Marland Kitchen Pt reports having last radiation treatment yesterday (10/11/21). Pt reports complete inability to lift L dominant arm to perform functional tasks to aid in dressing, eating, batheing, and grooming  hair. He reports function of his tricep but has the inability to curl. Pt reports constant pain in L UE and rates his pain 4/10 today and 6/10 at worst. Patient has a PMH of non-small cell lung cancer since 11/2019, chemotherapy tx, and previous history of L UE weakness familiar with this clinic d/t prior PT for L shoulder pain following accident at work. Pt denies N/V, B&B changes, unexplained weight fluctuation, saddle paresthesia, fever, night sweats, or unrelenting night pain at this time.    Limitations Lifting;House hold activities    How long can you sit comfortably? no time    How long can you stand comfortably? no time    How long can you walk comfortably? no time    Diagnostic tests 10/04/21 CERVICAL MRI Extensive postoperative changes in the cervical spine. Necrotic  paraspinal tumor has greatly progressed from a 05/05/2021 neck CT  but is similar in extent to a more recent cervical spine MRI  similar.07/20/21 PET SCAN The left paravertebral mass with destructive findings associated  with C4, C5, and C6 demonstrates substantial reduction from the  original PET-CT, currently 49 cubic cm and previously 200 cubic cm.  Activity has also reduced, with evidence of central necrosis and a  current maximum SUV of 11.2, formerly 20.3.    Patient Stated Goals Regain strength in L arm  Therex    AAROM with shoulder flex on stair railing x 12 reps   Seated & supine Scapular Retraction 1 x 12 each reps with tactile cues to encourage movement   Seated Scapular elevation and depression 2 x 12 reps with verbal and tactile cues    Scapular Protraction <> retraction in sitting 2 x 15 reps with PT assisting with verbal/tactile cues  Wt shifting on wall on elbows with PT providing stabilization for L forearm 1 x 12 reps   Modified plantigrade wt bearing on elbows with forearms supported wt shifting LUE to RUE 1 x 12 reps  Same position as above, wt bearing through elbows, bilat scapular  protraction/retraction  1 x 12 reps   L elbow extension in supine  with therapist stabilizing upper arm and assisting in lower to flexion: 1 x 12 reps                           PT Education - 10/18/21 1033     Education Details therex form/technique    Person(s) Educated Patient    Methods Explanation;Demonstration    Comprehension Verbalized understanding;Returned demonstration              PT Short Term Goals - 10/12/21 1339       PT SHORT TERM GOAL #1   Title Pt will be independent with HEP in order to improve L UE strength and ROM in order to improve function with ADLS at home and community    Baseline 10/12/20 HEP given    Time 4    Period Weeks    Status New    Target Date 11/09/21      PT SHORT TERM GOAL #2   Title Pt will demonstrate full passive L shoulder ROM to demonstrate PLOF joint mobility to allow for full AROM    Baseline 10/12/21 L shoulder flex: 143 abd 114 ER 85 IR 78    Time 4    Period Weeks    Status New               PT Long Term Goals - 10/15/21 0945       PT LONG TERM GOAL #1   Title Patient will increase FOTO score to 58 to demonstrate predicted increase in functional mobility to complete ADLs    Baseline 10/12/21: 44    Time 8    Period Weeks    Status New      PT LONG TERM GOAL #2   Title Patient will demonstrate at least 3/5 gross L shoulder strength and periscapular strength to demonstrate and strength needed for light household chores and ADLS    Baseline 96/22/29: 1/5    Time 8    Period Weeks    Status New      PT LONG TERM GOAL #3   Title Pt will demostrate L shoulder AROM within 120 degress flexion and abduction in order to reach objects above shoulder height.    Baseline 10/12/21 No available L shoulder ROM    Time 8    Period Weeks    Status New      PT LONG TERM GOAL #4   Title Pt will decrease worst pain as reported on NPRS by at least 3 points in order to demonstrate clinically  significant reduction in pain.    Baseline 10/12/21 6/10    Time 8    Period Weeks    Status New  PT LONG TERM GOAL #5   Title Pt iwll demonstrate L grip strength of at least 76.5lbs in order to demonstrate 90% of PLOF in order to complete heavy household ADLs    Baseline 10/14/21 L 38lb R 85lb    Time 8    Period Weeks    Status New                   Plan - 10/18/21 1114     Clinical Impression Statement Pt tolerated session fair with reports of increased pain with WB and AAROM exercises. Pt initiated proprioceptive exercises and  continued LUE and periscapular muscle recruitment. Pt continues to demonstrate minimal UE muscle activation with distal > proximal. Pt is able to comply with cuing for proper techinque of therex with manual aid in set up for proprioceptive activities. PT discussed need for orthopedic consult as well with patient, as patient demonstrates abnormal dermatome and myotome testing from C5-8. PT discussed with patient the need for orthopedic testing/input to gauge success of neural firing vs. a true strength issue which pt verbalizes understanding of as well. PT reached out to MD Janese Banks for referral for this as well .Pt will continue to benefit from skilled therapy to maximize functional use of L UE. Continue PT POC as able.    Personal Factors and Comorbidities Comorbidity 1;Fitness;Comorbidity 2;Profession;Time since onset of injury/illness/exacerbation;Past/Current Experience    Comorbidities asthma, non small cell lung cancer    Examination-Activity Limitations Bathing;Reach Overhead;Lift;Sleep;Carry;Bed Mobility    Examination-Participation Restrictions Community Activity;Cleaning;Driving    Stability/Clinical Decision Making Evolving/Moderate complexity    Clinical Decision Making High    Rehab Potential Poor    PT Frequency 2x / week    PT Duration 8 weeks    PT Treatment/Interventions ADLs/Self Care Home Management;Cryotherapy;Iontophoresis 4mg /ml  Dexamethasone;Functional mobility training;Patient/family education;Manual techniques;Joint Manipulations;Spinal Manipulations;Dry needling;Manual lymph drainage;Taping;Therapeutic exercise;Gait training;Traction;Ultrasound;Scientist, product/process development;Therapeutic activities;Neuromuscular re-education    PT Next Visit Plan proprioception, WB, functional use    PT Home Exercise Plan AAROM ABD and ER    Consulted and Agree with Plan of Care Patient             Patient will benefit from skilled therapeutic intervention in order to improve the following deficits and impairments:  Decreased mobility, Increased muscle spasms, Impaired sensation, Improper body mechanics, Impaired tone, Decreased range of motion, Decreased strength, Increased fascial restricitons, Impaired flexibility, Impaired UE functional use, Postural dysfunction, Pain, Decreased activity tolerance  Visit Diagnosis: Muscle weakness of left upper extremity     Problem List Patient Active Problem List   Diagnosis Date Noted   Weakness of left arm 07/10/2020   Goals of care, counseling/discussion 11/29/2019   Malignant neoplasm of upper lobe of left lung ([place]) 11/29/2019   Non-small cell cancer of left lung ([place]) 11/29/2019   Hemoptysis 11/29/2019    Durwin Reges DPT Sharion Settler, SPT  Durwin Reges, PT 10/18/2021, 3:28 PM  [facility] [address], [place], [address] Phone: [phone]   Fax:  [phone]  Name: DIAZ CRAGO MRN: [id number] Date of Birth: [birth year]

## 2021-10-19 ENCOUNTER — Encounter: Payer: Self-pay | Admitting: Oncology

## 2021-10-19 ENCOUNTER — Ambulatory Visit
Admission: RE | Admit: 2021-10-19 | Discharge: 2021-10-19 | Disposition: A | Discharge status: Home / Self Care | Payer: 59 | Source: Ambulatory Visit | Attending: Oncology | Admitting: Oncology

## 2021-10-19 DIAGNOSIS — C3490 Malignant neoplasm of unspecified part of unspecified bronchus or lung: Secondary | ICD-10-CM | POA: Insufficient documentation

## 2021-10-19 DIAGNOSIS — I350 Nonrheumatic aortic (valve) stenosis: Secondary | ICD-10-CM | POA: Insufficient documentation

## 2021-10-19 DIAGNOSIS — I340 Nonrheumatic mitral (valve) insufficiency: Secondary | ICD-10-CM | POA: Diagnosis not present

## 2021-10-19 DIAGNOSIS — Z01810 Encounter for preprocedural cardiovascular examination: Secondary | ICD-10-CM | POA: Diagnosis not present

## 2021-10-19 DIAGNOSIS — Z0189 Encounter for other specified special examinations: Secondary | ICD-10-CM | POA: Diagnosis not present

## 2021-10-19 LAB — ECHOCARDIOGRAM COMPLETE
AR max vel: 3.13 cm2
AV Area VTI: 3.06 cm2
AV Area mean vel: 2.94 cm2
AV Mean grad: 2 mmHg
AV Peak grad: 4.2 mmHg
Ao pk vel: 1.03 m/s
Area-P 1/2: 8.07 cm2
MV VTI: 3.77 cm2
S' Lateral: 3 cm

## 2021-10-19 NOTE — Telephone Encounter (Signed)
Hayley can you call her? I dont think ortho or emg is indicated. He did get a whole lot better with last round of PT. His nerves are affected but ortho or emg unlikely to help. PT first and see how much improvement he makes

## 2021-10-19 NOTE — Progress Notes (Signed)
*  PRELIMINARY RESULTS* Echocardiogram 2D Echocardiogram has been performed.  Kyle Guerrero 10/19/2021, 10:06 AM

## 2021-10-19 NOTE — Telephone Encounter (Signed)
Spoke with [facility] and Dr. Janese Banks via secure chat. PT will continue at this time. Referrals placed for neurology and ortho.

## 2021-10-20 ENCOUNTER — Ambulatory Visit: Payer: 59 | Admitting: Physical Therapy

## 2021-10-20 NOTE — Progress Notes (Signed)
Pharmacist Chemotherapy Monitoring - Initial Assessment    Anticipated start date: 10/25/21   The following has been reviewed per standard work regarding the patient's treatment regimen: The patient's diagnosis, treatment plan and drug doses, and organ/hematologic function Lab orders and baseline tests specific to treatment regimen  The treatment plan start date, drug sequencing, and pre-medications Prior authorization status  Patient's documented medication list, including drug-drug interaction screen and prescriptions for anti-emetics and supportive care specific to the treatment regimen The drug concentrations, fluid compatibility, administration routes, and timing of the medications to be used The patient's access for treatment and lifetime cumulative dose history, if applicable  The patient's medication allergies and previous infusion related reactions, if applicable   Changes made to treatment plan:  treatment plan date  Follow up needed:  signing treatment plan   Adelina Mings, [place], 10/20/2021  3:30 PM

## 2021-10-25 ENCOUNTER — Telehealth: Payer: Self-pay

## 2021-10-25 ENCOUNTER — Inpatient Hospital Stay: Payer: 59

## 2021-10-25 ENCOUNTER — Other Ambulatory Visit: Payer: Self-pay

## 2021-10-25 ENCOUNTER — Inpatient Hospital Stay (HOSPITAL_BASED_OUTPATIENT_CLINIC_OR_DEPARTMENT_OTHER): Payer: 59 | Admitting: Oncology

## 2021-10-25 VITALS — BP 120/105 | HR 122 | Temp 96.7°F | Resp 18 | Wt 147.8 lb

## 2021-10-25 DIAGNOSIS — C3490 Malignant neoplasm of unspecified part of unspecified bronchus or lung: Secondary | ICD-10-CM | POA: Diagnosis not present

## 2021-10-25 DIAGNOSIS — Z510 Encounter for antineoplastic radiation therapy: Secondary | ICD-10-CM | POA: Diagnosis not present

## 2021-10-25 DIAGNOSIS — G893 Neoplasm related pain (acute) (chronic): Secondary | ICD-10-CM

## 2021-10-25 DIAGNOSIS — C3412 Malignant neoplasm of upper lobe, left bronchus or lung: Secondary | ICD-10-CM

## 2021-10-25 DIAGNOSIS — Z5112 Encounter for antineoplastic immunotherapy: Secondary | ICD-10-CM

## 2021-10-25 DIAGNOSIS — R000 Tachycardia, unspecified: Secondary | ICD-10-CM | POA: Diagnosis not present

## 2021-10-25 LAB — CBC WITH DIFFERENTIAL/PLATELET
Abs Immature Granulocytes: 0.02 10*3/uL (ref 0.00–0.07)
Basophils Absolute: 0 10*3/uL (ref 0.0–0.1)
Basophils Relative: 1 %
Eosinophils Absolute: 0.2 10*3/uL (ref 0.0–0.5)
Eosinophils Relative: 4 %
HCT: 34.6 % — ABNORMAL LOW (ref 39.0–52.0)
Hemoglobin: 11.2 g/dL — ABNORMAL LOW (ref 13.0–17.0)
Immature Granulocytes: 0 %
Lymphocytes Relative: 22 %
Lymphs Abs: 1.1 10*3/uL (ref 0.7–4.0)
MCH: 27.9 pg (ref 26.0–34.0)
MCHC: 32.4 g/dL (ref 30.0–36.0)
MCV: 86.3 fL (ref 80.0–100.0)
Monocytes Absolute: 0.7 10*3/uL (ref 0.1–1.0)
Monocytes Relative: 13 %
Neutro Abs: 3.2 10*3/uL (ref 1.7–7.7)
Neutrophils Relative %: 60 %
Platelets: 521 10*3/uL — ABNORMAL HIGH (ref 150–400)
RBC: 4.01 MIL/uL — ABNORMAL LOW (ref 4.22–5.81)
RDW: 13.9 % (ref 11.5–15.5)
WBC: 5.3 10*3/uL (ref 4.0–10.5)
nRBC: 0 % (ref 0.0–0.2)

## 2021-10-25 LAB — COMPREHENSIVE METABOLIC PANEL
ALT: 18 U/L (ref 0–44)
AST: 17 U/L (ref 15–41)
Albumin: 3.3 g/dL — ABNORMAL LOW (ref 3.5–5.0)
Alkaline Phosphatase: 85 U/L (ref 38–126)
Anion gap: 9 (ref 5–15)
BUN: 12 mg/dL (ref 6–20)
CO2: 28 mmol/L (ref 22–32)
Calcium: 9 mg/dL (ref 8.9–10.3)
Chloride: 96 mmol/L — ABNORMAL LOW (ref 98–111)
Creatinine, Ser: 1.01 mg/dL (ref 0.61–1.24)
GFR, Estimated: >60 mL/min (ref >60)
Glucose, Bld: 100 mg/dL — ABNORMAL HIGH (ref 70–99)
Potassium: 3.6 mmol/L (ref 3.5–5.1)
Sodium: 133 mmol/L — ABNORMAL LOW (ref 135–145)
Total Bilirubin: 0.5 mg/dL (ref 0.3–1.2)
Total Protein: 7.8 g/dL (ref 6.5–8.1)

## 2021-10-25 MED ORDER — HEPARIN SOD (PORK) LOCK FLUSH 100 UNIT/ML IV SOLN
500.0000 [IU] | Freq: Once | INTRAVENOUS | Status: AC | PRN
  Administered 2021-10-25: 500 [IU]
  Filled 2021-10-25: qty 5

## 2021-10-25 MED ORDER — PALONOSETRON HCL INJECTION 0.25 MG/5ML
0.2500 mg | Freq: Once | INTRAVENOUS | Status: AC
  Administered 2021-10-25: 0.25 mg via INTRAVENOUS
  Filled 2021-10-25: qty 5

## 2021-10-25 MED ORDER — SODIUM CHLORIDE 0.9 % IV SOLN
10.0000 mg | Freq: Once | INTRAVENOUS | Status: AC
  Administered 2021-10-25: 10 mg via INTRAVENOUS
  Filled 2021-10-25: qty 10

## 2021-10-25 MED ORDER — DEXTROSE 5 % IV SOLN
Freq: Once | INTRAVENOUS | Status: AC
  Filled 2021-10-25: qty 250

## 2021-10-25 MED ORDER — FENTANYL 12 MCG/HR TD PT72: 1.0000 | MEDICATED_PATCH | TRANSDERMAL | 0 refills | Status: DC

## 2021-10-25 MED ORDER — ACETAMINOPHEN 325 MG PO TABS
650.0000 mg | ORAL_TABLET | Freq: Once | ORAL | Status: AC
  Administered 2021-10-25: 650 mg via ORAL
  Filled 2021-10-25: qty 2

## 2021-10-25 MED ORDER — FAM-TRASTUZUMAB DERUXTECAN-NXKI CHEMO 100 MG IV SOLR
5.4000 mg/kg | Freq: Once | INTRAVENOUS | Status: AC
  Administered 2021-10-25: 360 mg via INTRAVENOUS
  Filled 2021-10-25: qty 18

## 2021-10-25 MED ORDER — DIPHENHYDRAMINE HCL 25 MG PO CAPS
50.0000 mg | ORAL_CAPSULE | Freq: Once | ORAL | Status: AC
  Administered 2021-10-25: 50 mg via ORAL
  Filled 2021-10-25: qty 2

## 2021-10-25 MED ORDER — METOPROLOL TARTRATE 25 MG PO TABS: 12.5000 mg | ORAL_TABLET | Freq: Two times a day (BID) | ORAL | 0 refills | Status: DC

## 2021-10-25 NOTE — Telephone Encounter (Signed)
PA has been approved and pharmacy notified.

## 2021-10-25 NOTE — Progress Notes (Signed)
HR 122.  Ok to proceed per MD

## 2021-10-25 NOTE — Progress Notes (Signed)
Pt has no complaints at this time  

## 2021-10-25 NOTE — Telephone Encounter (Signed)
Prior authorization for Fentanyl patch submitted via Cover My Meds (Key: [id number])

## 2021-10-25 NOTE — Progress Notes (Signed)
Hematology/Oncology Consult note [facility]  [phone] [phone]  Patient Care Team: Default, Provider, MD as PCP - General Telford Nab, RN as Registered Nurse Sindy Guadeloupe, MD as Consulting Physician (Hematology and Oncology) Sindy Guadeloupe, MD as Consulting Physician (Hematology and Oncology)   Name of the patient: Kyle Guerrero  [id number]  07/03/1971   Date of visit: 10/25/21  Diagnosis- Non-small cell lung cancer stage IV acT2 cN2 cM1 a with pleural involvement as well as large cervical neck mass    Chief complaint/ Reason for visit-on treatment assessment prior to cycle 1 of Enhertu  Heme/Onc history: patient is a 50 year old male who presented to the ER with symptoms of heaviness in his chest and upper left chest discomfort he underwent CT angio chest to rule out PE which showed 3.8 x 3.3 cm left upper palpable lung mass along with 4.4 x 3.3 cm lobulated mass in the aortopulmonary window and 2.6 x 2.4 cm left hilar mass all concerning for malignancy.  Patient has also seen pulmonary and has been set up for bronchoscopy and EBUS guided biopsy on 11/23/2019.  Patient underwent.  PET CT scan which showed a hypermetabolic spiculated 3.5 cm of 5 left upper lobe lung mass, adjacent hypermetabolic 3.2 x 1.2 cm pleural metastases in the medial posterior by the left pleural space along with scalloping of the adjacent posterior left third rib.  Hypermetabolic infiltrative left perihilar conglomerate nodal metastases measuring up to 7.3 x 3.6 cm and 0.8 cm high left mediastinal node between the left brachiocephalic vein and left subclavian artery.  No evidence of distant metastatic disease   Biopsy showed non-small cell lung cancer but further characterization could not be determined.  Insufficient tissue for NGS testing. Repeat biopsy done. Results of NGS testing showed PD-L1 50%.  Tumor mutational burden high.  ERBB2 copy number again. 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    Patient completed concurrent chemoradiation with carbotaxol chemotherapy followed by 2 cycles of carbotaxol Keytruda and was on maintenance Keytruda   Patient was complaining of left shoulder pain and underwent MRI of the shoulder which showed tear in the supraspinatus tendon.  He was subsequently seen by [facility] and underwent MRI of the cervical spine which showed a 4.6 x 4.8 x 8.2 cm mass in the left prevertebral region from C2-C7 levels.  The mass partially encases the left vertebral artery and abuts the preforaminal segment.  Invades the vertebral bodies and edema of the left C5 articular pillar.  There is slight extension into the left C4-C5 thecal sac without central thecal sac compression.  The mass displaces the left pharynx anteriorly along the left carotid sheath.  Patient had a repeat biopsy which was consistent with adenocarcinoma.   Patient underwent palliative radiation treatment to his neck mass and completed 4 cycles of carboplatin and [name] with excellent response to treatment and is currently on maintenance [name].  Repeat NGS testing was also performed which did not show any evidence of actionable mutations other than ERBB2 gain   Patient had recurrence of his neck pain and repeat imaging showedInterim increase in the size of his neck mass and patient went for second opinion for both medical oncology as well as neurosurgery continue current was decided to cooperate upon this mass for definitive resection.  Patient underwent resection of cervical tumor which showed poorly differentiated metastatic adenocarcinoma on 08/29/2021.  TTF-1 negative.      Interval history-patient unable to  lift his left arm or move his forearm.  He is able to perform functions with his hand and shrugging shoulders.  Sensation over his arm is intact.  He does report ongoing neck pain as well as back pain.  He is using oxycodone about 2 doses a day but  pain is still not well controlled.  ECOG PS- 1 Pain scale- 5 Opioid associated constipation- no  Review of systems- Review of Systems  Constitutional:  Negative for chills, fever, malaise/fatigue and weight loss.  HENT:  Negative for congestion, ear discharge and nosebleeds.   Eyes:  Negative for blurred vision.  Respiratory:  Negative for cough, hemoptysis, sputum production, shortness of breath and wheezing.   Cardiovascular:  Negative for chest pain, palpitations, orthopnea and claudication.  Gastrointestinal:  Negative for abdominal pain, blood in stool, constipation, diarrhea, heartburn, melena, nausea and vomiting.  Genitourinary:  Negative for dysuria, flank pain, frequency, hematuria and urgency.  Musculoskeletal:  Positive for back pain and neck pain. Negative for joint pain and myalgias.  Skin:  Negative for rash.  Neurological:  Negative for dizziness, tingling, focal weakness, seizures, weakness and headaches.       Left upper extremity weakness  Endo/Heme/Allergies:  Does not bruise/bleed easily.  Psychiatric/Behavioral:  Negative for depression and suicidal ideas. The patient does not have insomnia.       No Known Allergies   Past Medical History:  Diagnosis Date   Asthma    Lung cancer ([place])      Past Surgical History:  Procedure Laterality Date   CYST EXCISION     IR IMAGING GUIDED PORT INSERTION  12/05/2019   VIDEO BRONCHOSCOPY WITH ENDOBRONCHIAL ULTRASOUND Left 11/22/2019   Procedure: VIDEO BRONCHOSCOPY WITH ENDOBRONCHIAL ULTRASOUND;  Surgeon: Tyler Pita, MD;  Location: [facility] ORS;  Service: Thoracic;  Laterality: Left;    Social History   Socioeconomic History   Marital status: Single    Spouse name: Not on file   Number of children: Not on file   Years of education: Not on file   Highest education level: Not on file  Occupational History   Not on file  Tobacco Use   Smoking status: Former    Packs/day: 2.00    Types: Cigarettes    Quit  date: 11/08/2019    Years since quitting: 1.9   Smokeless tobacco: Never  Vaping Use   Vaping Use: Never used  Substance and Sexual Activity   Alcohol use: Not Currently   Drug use: Yes    Types: Marijuana   Sexual activity: Not on file  Other Topics Concern   Not on file  Social History Narrative   Not on file   Social Determinants of Health   Financial Resource Strain: Not on file  Food Insecurity: Not on file  Transportation Needs: Not on file  Physical Activity: Not on file  Stress: Not on file  Social Connections: Not on file  Intimate Partner Violence: Not on file    Family History  Problem Relation Age of Onset   Healthy Mother    Healthy Father      Current Outpatient Medications:    baclofen (LIORESAL) 10 MG tablet, TAKE 1 TABLET BY MOUTH THREE TIMES A DAY, Disp: 90 tablet, Rfl: 1   fentaNYL (DURAGESIC) 12 MCG/HR, Place 1 patch onto the skin every 3 (three) days., Disp: 10 patch, Rfl: 0   metoprolol tartrate (LOPRESSOR) 25 MG tablet, Take 0.5 tablets (12.5 mg total) by mouth 2 (two) times daily.,  Disp: 30 tablet, Rfl: 0   oxyCODONE (OXY IR/ROXICODONE) 5 MG immediate release tablet, Take 1 tablet (5 mg total) by mouth every 4 (four) hours as needed for moderate pain or severe pain., Disp: 180 tablet, Rfl: 0   pregabalin (LYRICA) 75 MG capsule, Take 1 capsule (75 mg total) by mouth 2 (two) times daily., Disp: 60 capsule, Rfl: 2 No current facility-administered medications for this visit.  Facility-Administered Medications Ordered in Other Visits:    heparin lock flush 100 unit/mL, 500 Units, Intravenous, Once, Sindy Guadeloupe, MD   heparin lock flush 100 unit/mL, 500 Units, Intravenous, Once, Sindy Guadeloupe, MD   sodium chloride flush (NS) 0.9 % injection 10 mL, 10 mL, Intravenous, PRN, Sindy Guadeloupe, MD, 10 mL at 05/01/20 0900   sodium chloride flush (NS) 0.9 % injection 10 mL, 10 mL, Intravenous, PRN, Sindy Guadeloupe, MD, 10 mL at 01/29/21 0835  Physical exam:   Vitals:   10/25/21 0843  BP: (!) 120/105  Pulse: (!) 122  Resp: 18  Temp: (!) 96.7 F (35.9 C)  TempSrc: Tympanic  SpO2: 99%  Weight: 147 lb 12.8 oz (67 kg)   Physical Exam Constitutional:      General: He is not in acute distress. Cardiovascular:     Rate and Rhythm: Regular rhythm. Tachycardia present.     Heart sounds: Normal heart sounds.  Pulmonary:     Effort: Pulmonary effort is normal.     Breath sounds: Normal breath sounds.  Abdominal:     General: Bowel sounds are normal.     Palpations: Abdomen is soft.  Skin:    General: Skin is warm and dry.  Neurological:     Mental Status: He is alert and oriented to person, place, and time.     CMP Latest Ref Rng & Units 10/25/2021  Glucose 70 - 99 mg/dL 100(H)  BUN 6 - 20 mg/dL 12  Creatinine 0.61 - 1.24 mg/dL 1.01  Sodium 135 - 145 mmol/L 133(L)  Potassium 3.5 - 5.1 mmol/L 3.6  Chloride 98 - 111 mmol/L 96(L)  CO2 22 - 32 mmol/L 28  Calcium 8.9 - 10.3 mg/dL 9.0  Total Protein 6.5 - 8.1 g/dL 7.8  Total Bilirubin 0.3 - 1.2 mg/dL 0.5  Alkaline Phos 38 - 126 U/L 85  AST 15 - 41 U/L 17  ALT 0 - 44 U/L 18   CBC Latest Ref Rng & Units 10/25/2021  WBC 4.0 - 10.5 K/uL 5.3  Hemoglobin 13.0 - 17.0 g/dL 11.2(L)  Hematocrit 39.0 - 52.0 % 34.6(L)  Platelets 150 - 400 K/uL 521(H)    No images are attached to the encounter.  CT SOFT TISSUE NECK W CONTRAST  Result Date: 10/05/2021 CLINICAL DATA:  Restaging of metastatic lung cancer. EXAM: CT NECK WITH CONTRAST TECHNIQUE: Multidetector CT imaging of the neck was performed using the standard protocol following the bolus administration of intravenous contrast. CONTRAST:  130m OMNIPAQUE IOHEXOL 350 MG/ML SOLN COMPARISON:  Neck CT 05/05/2021.  Cervical spine MRI 08/28/2021. FINDINGS: Pharynx and larynx: Similar appearance of generalized mucosal edema compatible with prior radiation therapy. Small retropharyngeal effusion, smaller than on the MRI. Salivary glands: Post radiation  changes most notably involving the left submandibular gland. Thyroid: Unremarkable. Lymph nodes: No discrete enlarged lymph nodes in the neck. Vascular: Major vascular structures of the neck remain grossly patent including the left vertebral artery which is partially in case by the mass. Limited intracranial: Unremarkable. Visualized orbits: Unremarkable. Mastoids and visualized paranasal  sinuses: Mild right greater than left ethmoid air cell mucosal thickening. Clear mastoid air cells. Skeleton: C4-C6 corpectomy and C3-C7 anterior fusion, new from the prior neck CT though present on the interval MRI. Interval C2-T2 posterior fusion with articular pillar screws present at every level on the right except C7 and with absent left-sided screws C4, C5, and C7. Posterior bone graft material in place bilaterally. Destructive changes involving residual left-sided posterior elements at C4, C5 and C6, new from the prior neck CT and but evident on the interval MRI. Centrally low-density material with thick peripheral enhancement in the left paravertebral region extending from approximately C3-T1 with associated osseous erosion measures approximately 4.8 x 3.7 x 7.4 cm (AP x transverse x craniocaudal) and is consistent with previously shown necrotic tumor as well as potentially postoperative fluid medially where there has presumably been some tumor debulking. Overall, this tumor has greatly progressed from the prior neck CT but is more similar in extent to the more recent cervical spine MRI. A smaller amount of low-density, peripherally enhancing material in the right paravertebral region is new from the prior neck CT but also similar to the spine MRI. Assessment for epidural tumor is limited by streak artifact. A postoperative subcutaneous fluid collection in the posterior midline of the neck extends from C6-T2 measuring 1.6 cm AP x 8.3 cm craniocaudal. Upper chest: Reported separately. Other: None. IMPRESSION: 1. Extensive  postoperative changes in the cervical spine. Necrotic paraspinal tumor has greatly progressed from a 05/05/2021 neck CT but is similar in extent to a more recent cervical spine MRI similar. 2. No evidence of metastatic disease elsewhere in the neck. Electronically Signed   By: Logan Bores M.D.   On: 10/05/2021 13:34   CT CHEST ABDOMEN PELVIS W CONTRAST  Result Date: 10/04/2021 CLINICAL DATA:  Left upper lobe lung cancer restaging EXAM: CT CHEST, ABDOMEN, AND PELVIS WITH CONTRAST TECHNIQUE: Multidetector CT imaging of the chest, abdomen and pelvis was performed following the standard protocol during bolus administration of intravenous contrast. CONTRAST:  155m OMNIPAQUE IOHEXOL 350 MG/ML SOLN, additional oral enteric contrast COMPARISON:  PET-CT, 07/19/2021 FINDINGS: CT CHEST FINDINGS Cardiovascular: Right chest port catheter. Normal heart size. No pericardial effusion. Mediastinum/Nodes: No enlarged mediastinal, hilar, or axillary lymph nodes. Thyroid gland, trachea, and esophagus demonstrate no significant findings. Lungs/Pleura: Unchanged post treatment appearance of the paramedian right upper lobe and perihilar left lung. Mild, diffuse bilateral bronchial wall thickening. No pleural effusion or pneumothorax. Musculoskeletal: No chest wall mass or suspicious bone lesions identified. CT ABDOMEN PELVIS FINDINGS Hepatobiliary: No solid liver abnormality is seen. No gallstones, gallbladder wall thickening, or biliary dilatation. Pancreas: Unremarkable. No pancreatic ductal dilatation or surrounding inflammatory changes. Spleen: Normal in size without significant abnormality. Adrenals/Urinary Tract: Adrenal glands are unremarkable. Kidneys are normal, without renal calculi, solid lesion, or hydronephrosis. Bladder is unremarkable. Stomach/Bowel: Stomach is within normal limits. Appendix appears normal. No evidence of bowel wall thickening, distention, or inflammatory changes. Vascular/Lymphatic: No significant  vascular findings are present. No enlarged abdominal or pelvic lymph nodes. Reproductive: No mass or other abnormality. Other: No abdominal wall hernia or abnormality. No abdominopelvic ascites. Musculoskeletal: No acute or significant osseous findings. IMPRESSION: 1. Unchanged post treatment appearance of the paramedian right upper lobe and perihilar left lung. 2. No evidence of recurrent or metastatic disease in the chest, abdomen, or pelvis. 3. Mild, diffuse bilateral bronchial wall thickening, consistent with nonspecific infectious or inflammatory bronchitis. 4. Please see separately reported examination of the neck. Electronically Signed  By: Delanna Ahmadi M.D.   On: 10/04/2021 20:35   ECHOCARDIOGRAM COMPLETE  Result Date: 10/19/2021    ECHOCARDIOGRAM REPORT   Patient Name:   MIKHAI BIENVENUE Date of Exam: 10/19/2021 Medical Rec #:  [id number]         Height:       71.0 in Accession #:    [id number]        Weight:       146.6 lb Date of Birth:  [birth year]         BSA:          1.848 m Patient Age:    14 years          BP:           137/98 mmHg Patient Gender: M                 HR:           110 bpm. Exam Location:  [facility] Procedure: 2D Echo, Color Doppler, Cardiac Doppler and Strain Analysis Indications:     Z09 Chemotherapy  History:         Patient has no prior history of Echocardiogram examinations.                  Malignant neoplasm of lung, unspecified laterality, unspecified                  part of lung.  Sonographer:     Charmayne Sheer Referring Phys:  [id number] Weston Anna Roselia Snipe Diagnosing Phys: Nelva Bush MD  Sonographer Comments: Global longitudinal strain was attempted. IMPRESSIONS  1. Left ventricular ejection fraction, by estimation, is 55 to 60%. The left ventricle has normal function. Left ventricular endocardial border not optimally defined to evaluate regional wall motion. Left ventricular diastolic parameters are consistent with Grade I diastolic dysfunction (impaired relaxation).  2. Right  ventricular systolic function is normal. The right ventricular size is normal. There is normal pulmonary artery systolic pressure.  3. The mitral valve is normal in structure. Trivial mitral valve regurgitation. No evidence of mitral stenosis.  4. The aortic valve is tricuspid. Aortic valve regurgitation is not visualized. No aortic stenosis is present.  5. The inferior vena cava is normal in size with greater than 50% respiratory variability, suggesting right atrial pressure of 3 mmHg. FINDINGS  Left Ventricle: Left ventricular ejection fraction, by estimation, is 55 to 60%. The left ventricle has normal function. Left ventricular endocardial border not optimally defined to evaluate regional wall motion. Global longitudinal strain performed but  not reported based on interpreter judgement due to suboptimal tracking. The left ventricular internal cavity size was normal in size. There is borderline left ventricular hypertrophy. Left ventricular diastolic parameters are consistent with Grade I diastolic dysfunction (impaired relaxation). Right Ventricle: The right ventricular size is normal. No increase in right ventricular wall thickness. Right ventricular systolic function is normal. There is normal pulmonary artery systolic pressure. The tricuspid regurgitant velocity is 1.94 m/s, and  with an assumed right atrial pressure of 3 mmHg, the estimated right ventricular systolic pressure is 30.8 mmHg. Left Atrium: Left atrial size was normal in size. Right Atrium: Right atrial size was normal in size. Pericardium: There is no evidence of pericardial effusion. Mitral Valve: The mitral valve is normal in structure. Trivial mitral valve regurgitation. No evidence of mitral valve stenosis. MV peak gradient, 3.9 mmHg. The mean mitral valve gradient is 1.0 mmHg. Tricuspid Valve: The tricuspid valve is normal in  structure. Tricuspid valve regurgitation is trivial. Aortic Valve: The aortic valve is tricuspid. Aortic valve  regurgitation is not visualized. No aortic stenosis is present. Aortic valve mean gradient measures 2.0 mmHg. Aortic valve peak gradient measures 4.2 mmHg. Aortic valve area, by VTI measures 3.06 cm. Pulmonic Valve: The pulmonic valve was normal in structure. Pulmonic valve regurgitation is trivial. No evidence of pulmonic stenosis. Aorta: The aortic root is normal in size and structure. Pulmonary Artery: The pulmonary artery is not well seen. Venous: The inferior vena cava is normal in size with greater than 50% respiratory variability, suggesting right atrial pressure of 3 mmHg. IAS/Shunts: The interatrial septum was not well visualized.  LEFT VENTRICLE PLAX 2D LVIDd:         4.30 cm   Diastology LVIDs:         3.00 cm   LV e' medial:   5.44 cm/s LV PW:         1.19 cm   LV E/e' medial: 9.1 LV IVS:        0.96 cm LVOT diam:     2.20 cm LV SV:         44 LV SV Index:   24 LVOT Area:     3.80 cm  RIGHT VENTRICLE RV Basal diam:  2.90 cm LEFT ATRIUM             Index        RIGHT ATRIUM          Index LA diam:        2.40 cm 1.30 cm/m   RA Area:     9.21 cm LA Vol (A2C):   35.9 ml 19.43 ml/m  RA Volume:   19.10 ml 10.34 ml/m LA Vol (A4C):   18.3 ml 9.90 ml/m LA Biplane Vol: 26.0 ml 14.07 ml/m  AORTIC VALVE                    PULMONIC VALVE AV Area (Vmax):    3.13 cm     PV Vmax:          0.88 m/s AV Area (Vmean):   2.94 cm     PV Vmean:         68.200 cm/s AV Area (VTI):     3.06 cm     PV VTI:           0.142 m AV Vmax:           103.00 cm/s  PV Peak grad:     3.1 mmHg AV Vmean:          73.000 cm/s  PV Mean grad:     2.0 mmHg AV VTI:            0.143 m      PR End Diast Vel: 5.38 msec AV Peak Grad:      4.2 mmHg AV Mean Grad:      2.0 mmHg LVOT Vmax:         84.90 cm/s LVOT Vmean:        56.400 cm/s LVOT VTI:          0.115 m LVOT/AV VTI ratio: 0.80  AORTA Ao Root diam: 3.20 cm MITRAL VALVE               TRICUSPID VALVE MV Area (PHT): 8.07 cm    TR Peak grad:   15.1 mmHg MV Area VTI:   3.77 cm    TR  Vmax:        194.00 cm/s MV Peak grad:  3.9 mmHg MV Mean grad:  1.0 mmHg    SHUNTS MV Vmax:       0.98 m/s    Systemic VTI:  0.12 m MV Vmean:      52.6 cm/s   Systemic Diam: 2.20 cm MV Decel Time: 94 msec MV E velocity: 49.70 cm/s MV A velocity: 82.30 cm/s MV E/A ratio:  0.60 Harrell Gave End MD Electronically signed by Nelva Bush MD Signature Date/Time: 10/19/2021/4:17:27 PM    Final      Assessment and plan- Patient is a 50 y.o. male with recurrent metastatic adenocarcinoma of the lung stage IV presently with a large tumor involving the cervical spine s/p surgery and adjuvant radiation treatment with residual tumor.  He is here for on treatment assessment prior to cycle 1 of Enhertu chemotherapy  Counts okay to proceed with cycle 1 of Enhertu chemotherapy today.  Basal echocardiogram shows a normal EF.  We will continue to monitor echo every 3 months.  Discussed risk and benefits of Enhertu including all but not limited to nausea, vomiting, low blood counts, risk of infections and hospitalization.  Risk of pneumonitis and low ejection fraction associated with Enhertu.  Treatment will be given with a palliative intent until progression or toxicity.  Patient understands and agrees to proceed as planned.  I will see if we can get Udenyca approved by his insurance since he has developed chemo induced neutropenia in the past.  He will be seen by covering NP in 10 days for possible IV fluids and I will see him back in 3 weeks for cycle 2 of Enhertu chemotherapy.  Despite undergoing resection of the cervical neck mass as well as adjuvant radiation treatment he still has significant residual tumor necessitating the start of treatment at this time.  Patient has not recovered any of his left upper extremity function despite surgery and radiation treatment.  He has no power in his proximal and distal left arm muscles.  Sensation is intact.  He will be seen by neurology Dr. Mickeal Skinner this week.  He is working with  physical therapy but they would also like neurology input to see if he needs any further EMG studies at this time.  He has neurosurgery appointment at [facility] next month as well  Neoplasm related pain: We will start fentanyl patch 12 mcg and he will continue as needed oxycodone  Sinus tachycardia: This has been an issue since his surgery.  Prior to that his heart rate was in the 80s and presently in the 120s.  Patient denies any symptoms of palpitations or dizziness.  I will start him on low-dose metoprolol 12.5 mg twice daily   Visit Diagnosis 1. Malignant neoplasm of lung, unspecified laterality, unspecified part of lung ([place])   2. Encounter for monoclonal antibody treatment for malignancy   3. Malignant neoplasm of upper lobe of left lung ([place])   4. Neoplasm related pain   5. Sinus tachycardia      Dr. Randa Evens, MD, MPH [facility] [phone] 10/25/2021 9:28 AM

## 2021-10-27 ENCOUNTER — Ambulatory Visit: Payer: 59 | Admitting: Physical Therapy

## 2021-10-27 ENCOUNTER — Inpatient Hospital Stay: Payer: 59 | Attending: Oncology

## 2021-10-27 ENCOUNTER — Other Ambulatory Visit: Payer: Self-pay

## 2021-10-27 DIAGNOSIS — Z87891 Personal history of nicotine dependence: Secondary | ICD-10-CM | POA: Insufficient documentation

## 2021-10-27 DIAGNOSIS — Z5189 Encounter for other specified aftercare: Secondary | ICD-10-CM | POA: Insufficient documentation

## 2021-10-27 DIAGNOSIS — G588 Other specified mononeuropathies: Secondary | ICD-10-CM | POA: Diagnosis not present

## 2021-10-27 DIAGNOSIS — C3412 Malignant neoplasm of upper lobe, left bronchus or lung: Secondary | ICD-10-CM

## 2021-10-27 DIAGNOSIS — Z79899 Other long term (current) drug therapy: Secondary | ICD-10-CM | POA: Diagnosis not present

## 2021-10-27 MED ORDER — PEGFILGRASTIM-CBQV 6 MG/0.6ML ~~LOC~~ SOSY
6.0000 mg | PREFILLED_SYRINGE | Freq: Once | SUBCUTANEOUS | Status: AC
  Administered 2021-10-27: 6 mg via SUBCUTANEOUS
  Filled 2021-10-27: qty 0.6

## 2021-10-29 ENCOUNTER — Encounter: Payer: Self-pay | Admitting: Internal Medicine

## 2021-10-29 ENCOUNTER — Inpatient Hospital Stay (HOSPITAL_BASED_OUTPATIENT_CLINIC_OR_DEPARTMENT_OTHER): Payer: 59 | Admitting: Internal Medicine

## 2021-10-29 ENCOUNTER — Other Ambulatory Visit: Payer: Self-pay

## 2021-10-29 VITALS — BP 128/90 | HR 123 | Temp 95.4°F | Resp 18 | Wt 144.2 lb

## 2021-10-29 DIAGNOSIS — C3412 Malignant neoplasm of upper lobe, left bronchus or lung: Secondary | ICD-10-CM | POA: Diagnosis not present

## 2021-10-29 DIAGNOSIS — R29898 Other symptoms and signs involving the musculoskeletal system: Secondary | ICD-10-CM

## 2021-10-29 MED ORDER — PROCHLORPERAZINE MALEATE 10 MG PO TABS: 10.0000 mg | ORAL_TABLET | Freq: Four times a day (QID) | ORAL | 1 refills | Status: AC | PRN

## 2021-10-29 NOTE — Progress Notes (Signed)
[facility] [place], [place] [address] [phone]   New Patient Evaluation  Date of Service: 10/29/21 Patient Name: Kyle Guerrero Patient MRN: [id number] Patient DOB: [birth year] Provider: Ventura Sellers, MD  Identifying Statement:  Kyle Guerrero is a 50 y.o. male with Weakness of left arm who presents for initial consultation and evaluation regarding cancer associated neurologic deficits.    Referring Provider: Sindy Guadeloupe, MD [place],   [id number]  Primary Cancer:  Oncologic History: Oncology History  Malignant neoplasm of upper lobe of left lung ([place])  11/29/2019 Initial Diagnosis   Malignant neoplasm of upper lobe of left lung ([place])   11/29/2019 Cancer Staging   Staging form: Lung, AJCC 8th Edition - Clinical stage from 11/29/2019: Stage IVA (cT2, cN2, cM1a) - Signed by Sindy Guadeloupe, MD on 11/29/2019    12/03/2019 - 01/16/2020 Chemotherapy   The patient had dexamethasone (DECADRON) 4 MG tablet, 8 mg, Oral, Daily, 1 of 1 cycle, Start date: 11/29/2019, End date: 02/04/2020 palonosetron (ALOXI) injection 0.25 mg, 0.25 mg, Intravenous,  Once, 7 of 7 cycles Administration: 0.25 mg (12/03/2019), 0.25 mg (12/12/2019), 0.25 mg (12/19/2019), 0.25 mg (12/26/2019), 0.25 mg (01/02/2020), 0.25 mg (01/09/2020), 0.25 mg (01/16/2020) CARBOplatin (PARAPLATIN) 260 mg in sodium chloride 0.9 % 250 mL chemo infusion, 260 mg (100 % of original dose 262 mg), Intravenous,  Once, 7 of 7 cycles Dose modification:   (original dose 262 mg, Cycle 1) Administration: 260 mg (12/03/2019), 260 mg (12/12/2019), 290 mg (12/19/2019), 290 mg (12/26/2019), 290 mg (01/02/2020), 290 mg (01/09/2020), 290 mg (01/16/2020) PACLitaxel (TAXOL) 84 mg in sodium chloride 0.9 % 250 mL chemo infusion (</= 84m/m2), 45 mg/m2 = 84 mg, Intravenous,  Once, 7 of 7 cycles Administration: 84 mg (12/03/2019), 84 mg (12/12/2019), 84 mg (12/19/2019), 84 mg  (12/26/2019), 84 mg (01/02/2020), 84 mg (01/09/2020), 84 mg (01/16/2020)   for chemotherapy treatment.     02/21/2020 - 07/24/2020 Chemotherapy   The patient had palonosetron (ALOXI) injection 0.25 mg, 0.25 mg, Intravenous,  Once, 2 of 2 cycles Administration: 0.25 mg (02/21/2020), 0.25 mg (03/13/2020) pegfilgrastim (NEULASTA ONPRO KIT) injection 6 mg, 6 mg, Subcutaneous, Once, 2 of 2 cycles Administration: 6 mg (02/21/2020), 6 mg (03/13/2020) CARBOplatin (PARAPLATIN) 750 mg in sodium chloride 0.9 % 250 mL chemo infusion, 750 mg (114 % of original dose 658 mg), Intravenous,  Once, 2 of 2 cycles Dose modification:   (original dose 658 mg, Cycle 1) Administration: 750 mg (02/21/2020), 670 mg (03/13/2020) fosaprepitant (EMEND) 150 mg in sodium chloride 0.9 % 145 mL IVPB, 150 mg, Intravenous,  Once, 2 of 2 cycles Administration: 150 mg (02/21/2020), 150 mg (03/13/2020) PACLitaxel (TAXOL) 342 mg in sodium chloride 0.9 % 500 mL chemo infusion (> 832mm2), 175 mg/m2 = 342 mg (100 % of original dose 175 mg/m2), Intravenous,  Once, 2 of 2 cycles Dose modification: 175 mg/m2 (original dose 175 mg/m2, Cycle 1, Reason: Other (see comments), Comment: cytopenia) Administration: 342 mg (02/21/2020), 342 mg (03/13/2020) pembrolizumab (KEYTRUDA) 200 mg in sodium chloride 0.9 % 50 mL chemo infusion, 200 mg, Intravenous, Once, 8 of 9 cycles Administration: 200 mg (02/21/2020), 200 mg (04/10/2020), 200 mg (05/01/2020), 200 mg (03/13/2020), 200 mg (05/22/2020), 200 mg (06/12/2020), 200 mg (07/03/2020), 200 mg (07/24/2020)   for chemotherapy treatment.     08/21/2020 - 06/25/2021 Chemotherapy   Patient is on Treatment Plan : LUNG NSCLC Pemetrexed ([name]) / Carboplatin q21d x  4 cycles     10/25/2021 -  Chemotherapy   Patient is on Treatment Plan : Lung Fam-Trastuzumab Deruxtecan-nxki (Enhertu) (6.4) q21d      CNS Oncologic History 08/20/20: Patient complains of pain, weakness in left arm, MRI c-spine demonstrates paravetebral  mass 09/03/20: Completes 30/10 spine/vertebral RT with Dr. Donella Stade, weakness improves 07/05/21: Recurrence of left shoulder and arm weakness, dense.  MRI demonstrates progressive disease 08/29/21: Completes 2 stage laminectomy, tumor debulking, corpectomy, posterior fusion at [facility] 10/11/21: Completes adjuvant radiation to residual tumor with Dr. Donella Stade  History of Present Illness: The patient's records from the referring physician were obtained and reviewed and the patient interviewed to confirm this HPI.  Kyle Guerrero presents for evaluation for left arm weakness from left paravetebral mass and treatment.  His clinical and treatment history is detailed above.  He describes ongoing weakness of left shoulder, bicep, tricep since this summer.  No improvement noted with surgery or radiation, unfortunately.  He has been undergoing rehab treatments, but he is frustrated they have not helped more.  He does retain some use of his left hand, although this is limited because "my arm hangs down".  Recently started on Enhertu cycle #1 with Dr. Janese Banks.  Medications: Current Outpatient Medications on File Prior to Visit  Medication Sig Dispense Refill   baclofen (LIORESAL) 10 MG tablet TAKE 1 TABLET BY MOUTH THREE TIMES A DAY 90 tablet 1   metoprolol tartrate (LOPRESSOR) 25 MG tablet Take 0.5 tablets (12.5 mg total) by mouth 2 (two) times daily. 30 tablet 0   oxyCODONE (OXY IR/ROXICODONE) 5 MG immediate release tablet Take 1 tablet (5 mg total) by mouth every 4 (four) hours as needed for moderate pain or severe pain. 180 tablet 0   pregabalin (LYRICA) 75 MG capsule Take 1 capsule (75 mg total) by mouth 2 (two) times daily. 60 capsule 2   sucralfate (CARAFATE) 1 g tablet Take by mouth.     fentaNYL (DURAGESIC) 12 MCG/HR Place 1 patch onto the skin every 3 (three) days. (Patient not taking: Reported on 10/29/2021) 10 patch 0   [DISCONTINUED] prochlorperazine (COMPAZINE) 10 MG tablet TAKE 1 TABLET (10 MG TOTAL)  BY MOUTH EVERY 6 (SIX) HOURS AS NEEDED (NAUSEA OR VOMITING). (Patient not taking: No sig reported) 30 tablet 1   Current Facility-Administered Medications on File Prior to Visit  Medication Dose Route Frequency Provider Last Rate Last Admin   heparin lock flush 100 unit/mL  500 Units Intravenous Once Sindy Guadeloupe, MD       heparin lock flush 100 unit/mL  500 Units Intravenous Once Sindy Guadeloupe, MD       sodium chloride flush (NS) 0.9 % injection 10 mL  10 mL Intravenous PRN Sindy Guadeloupe, MD   10 mL at 05/01/20 0900   sodium chloride flush (NS) 0.9 % injection 10 mL  10 mL Intravenous PRN Sindy Guadeloupe, MD   10 mL at 01/29/21 [id number]    Allergies: No Known Allergies Past Medical History:  Past Medical History:  Diagnosis Date   Asthma    Lung cancer ([place])    Past Surgical History:  Past Surgical History:  Procedure Laterality Date   CYST EXCISION     IR IMAGING GUIDED PORT INSERTION  12/05/2019   VIDEO BRONCHOSCOPY WITH ENDOBRONCHIAL ULTRASOUND Left 11/22/2019   Procedure: VIDEO BRONCHOSCOPY WITH ENDOBRONCHIAL ULTRASOUND;  Surgeon: Tyler Pita, MD;  Location: [facility] ORS;  Service: Thoracic;  Laterality: Left;   Social History:  Social History   Socioeconomic History   Marital status: Single    Spouse name: Not on file   Number of children: Not on file   Years of education: Not on file   Highest education level: Not on file  Occupational History   Not on file  Tobacco Use   Smoking status: Former    Packs/day: 2.00    Types: Cigarettes    Quit date: 11/08/2019    Years since quitting: 1.9   Smokeless tobacco: Never  Vaping Use   Vaping Use: Never used  Substance and Sexual Activity   Alcohol use: Not Currently   Drug use: Yes    Types: Marijuana   Sexual activity: Not on file  Other Topics Concern   Not on file  Social History Narrative   Not on file   Social Determinants of Health   Financial Resource Strain: Not on file  Food Insecurity: Not on file   Transportation Needs: Not on file  Physical Activity: Not on file  Stress: Not on file  Social Connections: Not on file  Intimate Partner Violence: Not on file   Family History:  Family History  Problem Relation Age of Onset   Healthy Mother    Healthy Father     Review of Systems: Constitutional: Doesn't report fevers, chills or abnormal weight loss Eyes: Doesn't report blurriness of vision Ears, nose, mouth, throat, and face: Doesn't report sore throat Respiratory: Doesn't report cough, dyspnea or wheezes Cardiovascular: Doesn't report palpitation, chest discomfort  Gastrointestinal:  Doesn't report nausea, constipation, diarrhea GU: Doesn't report incontinence Skin: Doesn't report skin rashes Neurological: Per HPI Musculoskeletal: Doesn't report joint pain Behavioral/Psych: Doesn't report anxiety  Physical Exam: Vitals:   10/29/21 1043  BP: 128/90  Pulse: (!) 123  Resp: 18  Temp: (!) 95.4 F (35.2 C)   KPS: 70. General: Alert, cooperative, pleasant, in no acute distress Head: Normal EENT: No conjunctival injection or scleral icterus.  Lungs: Resp effort normal Cardiac: Regular rate Abdomen: Non-distended abdomen Skin: No rashes cyanosis or petechiae. Extremities: No clubbing or edema  Neurologic Exam: Mental Status: Awake, alert, attentive to examiner. Oriented to self and environment. Language is fluent with intact comprehension.  Cranial Nerves: Visual acuity is grossly normal. Visual fields are full. Extra-ocular movements intact. No ptosis. Face is symmetric Motor: Tone and bulk are normal. Power 1/5 at shoulder extension, elbow flexion and extension.  Distally 4/5 in wrist, 5/5 in digits. Reflexes are diminished L BR, biceps, no pathologic reflexes present.  Sensory: Intact to light touch Gait: Normal.   Labs: I have reviewed the data as listed    Component Value Date/Time   NA 133 (L) 10/25/2021 0823   K 3.6 10/25/2021 0823   CL 96 (L) 10/25/2021  0823   CO2 28 10/25/2021 0823   GLUCOSE 100 (H) 10/25/2021 0823   BUN 12 10/25/2021 0823   CREATININE 1.01 10/25/2021 0823   CALCIUM 9.0 10/25/2021 0823   PROT 7.8 10/25/2021 0823   ALBUMIN 3.3 (L) 10/25/2021 0823   AST 17 10/25/2021 0823   ALT 18 10/25/2021 0823   ALKPHOS 85 10/25/2021 0823   BILITOT 0.5 10/25/2021 0823   GFRNONAA >60 10/25/2021 0823   GFRAA >60 09/11/2020 0821   Lab Results  Component Value Date   WBC 5.3 10/25/2021   NEUTROABS 3.2 10/25/2021   HGB 11.2 (L) 10/25/2021   HCT 34.6 (L) 10/25/2021   MCV 86.3 10/25/2021   PLT 521 (H) 10/25/2021  Imaging:  CT SOFT TISSUE NECK W CONTRAST  Result Date: 10/05/2021 CLINICAL DATA:  Restaging of metastatic lung cancer. EXAM: CT NECK WITH CONTRAST TECHNIQUE: Multidetector CT imaging of the neck was performed using the standard protocol following the bolus administration of intravenous contrast. CONTRAST:  142m OMNIPAQUE IOHEXOL 350 MG/ML SOLN COMPARISON:  Neck CT 05/05/2021.  Cervical spine MRI 08/28/2021. FINDINGS: Pharynx and larynx: Similar appearance of generalized mucosal edema compatible with prior radiation therapy. Small retropharyngeal effusion, smaller than on the MRI. Salivary glands: Post radiation changes most notably involving the left submandibular gland. Thyroid: Unremarkable. Lymph nodes: No discrete enlarged lymph nodes in the neck. Vascular: Major vascular structures of the neck remain grossly patent including the left vertebral artery which is partially in case by the mass. Limited intracranial: Unremarkable. Visualized orbits: Unremarkable. Mastoids and visualized paranasal sinuses: Mild right greater than left ethmoid air cell mucosal thickening. Clear mastoid air cells. Skeleton: C4-C6 corpectomy and C3-C7 anterior fusion, new from the prior neck CT though present on the interval MRI. Interval C2-T2 posterior fusion with articular pillar screws present at every level on the right except C7 and with absent  left-sided screws C4, C5, and C7. Posterior bone graft material in place bilaterally. Destructive changes involving residual left-sided posterior elements at C4, C5 and C6, new from the prior neck CT and but evident on the interval MRI. Centrally low-density material with thick peripheral enhancement in the left paravertebral region extending from approximately C3-T1 with associated osseous erosion measures approximately 4.8 x 3.7 x 7.4 cm (AP x transverse x craniocaudal) and is consistent with previously shown necrotic tumor as well as potentially postoperative fluid medially where there has presumably been some tumor debulking. Overall, this tumor has greatly progressed from the prior neck CT but is more similar in extent to the more recent cervical spine MRI. A smaller amount of low-density, peripherally enhancing material in the right paravertebral region is new from the prior neck CT but also similar to the spine MRI. Assessment for epidural tumor is limited by streak artifact. A postoperative subcutaneous fluid collection in the posterior midline of the neck extends from C6-T2 measuring 1.6 cm AP x 8.3 cm craniocaudal. Upper chest: Reported separately. Other: None. IMPRESSION: 1. Extensive postoperative changes in the cervical spine. Necrotic paraspinal tumor has greatly progressed from a 05/05/2021 neck CT but is similar in extent to a more recent cervical spine MRI similar. 2. No evidence of metastatic disease elsewhere in the neck. Electronically Signed   By: ALogan BoresM.D.   On: 10/05/2021 13:34   CT CHEST ABDOMEN PELVIS W CONTRAST  Result Date: 10/04/2021 CLINICAL DATA:  Left upper lobe lung cancer restaging EXAM: CT CHEST, ABDOMEN, AND PELVIS WITH CONTRAST TECHNIQUE: Multidetector CT imaging of the chest, abdomen and pelvis was performed following the standard protocol during bolus administration of intravenous contrast. CONTRAST:  1033mOMNIPAQUE IOHEXOL 350 MG/ML SOLN, additional oral enteric  contrast COMPARISON:  PET-CT, 07/19/2021 FINDINGS: CT CHEST FINDINGS Cardiovascular: Right chest port catheter. Normal heart size. No pericardial effusion. Mediastinum/Nodes: No enlarged mediastinal, hilar, or axillary lymph nodes. Thyroid gland, trachea, and esophagus demonstrate no significant findings. Lungs/Pleura: Unchanged post treatment appearance of the paramedian right upper lobe and perihilar left lung. Mild, diffuse bilateral bronchial wall thickening. No pleural effusion or pneumothorax. Musculoskeletal: No chest wall mass or suspicious bone lesions identified. CT ABDOMEN PELVIS FINDINGS Hepatobiliary: No solid liver abnormality is seen. No gallstones, gallbladder wall thickening, or biliary dilatation. Pancreas: Unremarkable. No pancreatic ductal dilatation  or surrounding inflammatory changes. Spleen: Normal in size without significant abnormality. Adrenals/Urinary Tract: Adrenal glands are unremarkable. Kidneys are normal, without renal calculi, solid lesion, or hydronephrosis. Bladder is unremarkable. Stomach/Bowel: Stomach is within normal limits. Appendix appears normal. No evidence of bowel wall thickening, distention, or inflammatory changes. Vascular/Lymphatic: No significant vascular findings are present. No enlarged abdominal or pelvic lymph nodes. Reproductive: No mass or other abnormality. Other: No abdominal wall hernia or abnormality. No abdominopelvic ascites. Musculoskeletal: No acute or significant osseous findings. IMPRESSION: 1. Unchanged post treatment appearance of the paramedian right upper lobe and perihilar left lung. 2. No evidence of recurrent or metastatic disease in the chest, abdomen, or pelvis. 3. Mild, diffuse bilateral bronchial wall thickening, consistent with nonspecific infectious or inflammatory bronchitis. 4. Please see separately reported examination of the neck. Electronically Signed   By: Delanna Ahmadi M.D.   On: 10/04/2021 20:35   ECHOCARDIOGRAM  COMPLETE  Result Date: 10/19/2021    ECHOCARDIOGRAM REPORT   Patient Name:   Kyle Guerrero Date of Exam: 10/19/2021 Medical Rec #:  [id number]         Height:       71.0 in Accession #:    [id number]        Weight:       146.6 lb Date of Birth:  28-May-1971         BSA:          1.848 m Patient Age:    25 years          BP:           137/98 mmHg Patient Gender: M                 HR:           110 bpm. Exam Location:  [facility] Procedure: 2D Echo, Color Doppler, Cardiac Doppler and Strain Analysis Indications:     Z09 Chemotherapy  History:         Patient has no prior history of Echocardiogram examinations.                  Malignant neoplasm of lung, unspecified laterality, unspecified                  part of lung.  Sonographer:     Charmayne Sheer Referring Phys:  [id number] Weston Anna RAO Diagnosing Phys: Nelva Bush MD  Sonographer Comments: Global longitudinal strain was attempted. IMPRESSIONS  1. Left ventricular ejection fraction, by estimation, is 55 to 60%. The left ventricle has normal function. Left ventricular endocardial border not optimally defined to evaluate regional wall motion. Left ventricular diastolic parameters are consistent with Grade I diastolic dysfunction (impaired relaxation).  2. Right ventricular systolic function is normal. The right ventricular size is normal. There is normal pulmonary artery systolic pressure.  3. The mitral valve is normal in structure. Trivial mitral valve regurgitation. No evidence of mitral stenosis.  4. The aortic valve is tricuspid. Aortic valve regurgitation is not visualized. No aortic stenosis is present.  5. The inferior vena cava is normal in size with greater than 50% respiratory variability, suggesting right atrial pressure of 3 mmHg. FINDINGS  Left Ventricle: Left ventricular ejection fraction, by estimation, is 55 to 60%. The left ventricle has normal function. Left ventricular endocardial border not optimally defined to evaluate regional wall motion.  Global longitudinal strain performed but  not reported based on interpreter judgement due to suboptimal tracking. The left ventricular internal cavity size  was normal in size. There is borderline left ventricular hypertrophy. Left ventricular diastolic parameters are consistent with Grade I diastolic dysfunction (impaired relaxation). Right Ventricle: The right ventricular size is normal. No increase in right ventricular wall thickness. Right ventricular systolic function is normal. There is normal pulmonary artery systolic pressure. The tricuspid regurgitant velocity is 1.94 m/s, and  with an assumed right atrial pressure of 3 mmHg, the estimated right ventricular systolic pressure is 72.5 mmHg. Left Atrium: Left atrial size was normal in size. Right Atrium: Right atrial size was normal in size. Pericardium: There is no evidence of pericardial effusion. Mitral Valve: The mitral valve is normal in structure. Trivial mitral valve regurgitation. No evidence of mitral valve stenosis. MV peak gradient, 3.9 mmHg. The mean mitral valve gradient is 1.0 mmHg. Tricuspid Valve: The tricuspid valve is normal in structure. Tricuspid valve regurgitation is trivial. Aortic Valve: The aortic valve is tricuspid. Aortic valve regurgitation is not visualized. No aortic stenosis is present. Aortic valve mean gradient measures 2.0 mmHg. Aortic valve peak gradient measures 4.2 mmHg. Aortic valve area, by VTI measures 3.06 cm. Pulmonic Valve: The pulmonic valve was normal in structure. Pulmonic valve regurgitation is trivial. No evidence of pulmonic stenosis. Aorta: The aortic root is normal in size and structure. Pulmonary Artery: The pulmonary artery is not well seen. Venous: The inferior vena cava is normal in size with greater than 50% respiratory variability, suggesting right atrial pressure of 3 mmHg. IAS/Shunts: The interatrial septum was not well visualized.  LEFT VENTRICLE PLAX 2D LVIDd:         4.30 cm   Diastology LVIDs:          3.00 cm   LV e' medial:   5.44 cm/s LV PW:         1.19 cm   LV E/e' medial: 9.1 LV IVS:        0.96 cm LVOT diam:     2.20 cm LV SV:         44 LV SV Index:   24 LVOT Area:     3.80 cm  RIGHT VENTRICLE RV Basal diam:  2.90 cm LEFT ATRIUM             Index        RIGHT ATRIUM          Index LA diam:        2.40 cm 1.30 cm/m   RA Area:     9.21 cm LA Vol (A2C):   35.9 ml 19.43 ml/m  RA Volume:   19.10 ml 10.34 ml/m LA Vol (A4C):   18.3 ml 9.90 ml/m LA Biplane Vol: 26.0 ml 14.07 ml/m  AORTIC VALVE                    PULMONIC VALVE AV Area (Vmax):    3.13 cm     PV Vmax:          0.88 m/s AV Area (Vmean):   2.94 cm     PV Vmean:         68.200 cm/s AV Area (VTI):     3.06 cm     PV VTI:           0.142 m AV Vmax:           103.00 cm/s  PV Peak grad:     3.1 mmHg AV Vmean:          73.000 cm/s  PV Mean grad:  2.0 mmHg AV VTI:            0.143 m      PR End Diast Vel: 5.38 msec AV Peak Grad:      4.2 mmHg AV Mean Grad:      2.0 mmHg LVOT Vmax:         84.90 cm/s LVOT Vmean:        56.400 cm/s LVOT VTI:          0.115 m LVOT/AV VTI ratio: 0.80  AORTA Ao Root diam: 3.20 cm MITRAL VALVE               TRICUSPID VALVE MV Area (PHT): 8.07 cm    TR Peak grad:   15.1 mmHg MV Area VTI:   3.77 cm    TR Vmax:        194.00 cm/s MV Peak grad:  3.9 mmHg MV Mean grad:  1.0 mmHg    SHUNTS MV Vmax:       0.98 m/s    Systemic VTI:  0.12 m MV Vmean:      52.6 cm/s   Systemic Diam: 2.20 cm MV Decel Time: 94 msec MV E velocity: 49.70 cm/s MV A velocity: 82.30 cm/s MV E/A ratio:  0.60 Christopher End MD Electronically signed by Nelva Bush MD Signature Date/Time: 10/19/2021/4:17:27 PM    Final      Assessment/Plan Weakness of left arm  Kyle Guerrero presents with clinical and radiographic syndrome localizing to left paravertebral nerve roots C4-C7, as well as cord parenchyma at these levels.  MRI demonstrates significant neuroforaminal compression from tumor, as well as cord signal abnormality from tumor and  post-RT changes.    Etiology of current motor dysfunction may be multifactorial: deficits from tumor recurrence and prolonged nerve root compression, post-radiation radiculopathy and myelopathy, and surgical changes.  It is difficult to tell the degree of contribution from each of these insults.  Based on predominance of weakness over pain/sensory, post-RT injury may be most implicated.  EMG could be helpful to localize deficits further, but will not likely be helpful in modifying plan of care to return motor function.  We will defer at this time.  We explained that aggressive PT is his main avenue for recovering function, and it is unclear then extent of recovery that is possible.    He will be following up with neurosurgery at [facility] for monitoring and surveillance of spine tumor.    We spent twenty additional minutes teaching regarding the natural history, biology, and historical experience in the treatment of neurologic complications of cancer.   We appreciate the opportunity to participate in the care of Kyle Guerrero.  He may follow up with [place] as needed.  All questions were answered. The patient knows to call the clinic with any problems, questions or concerns. No barriers to learning were detected.  The total time spent in the encounter was 40 minutes and more than 50% was on counseling and review of test results   Ventura Sellers, MD Medical Director of Neuro-Oncology [facility] at [place] 10/29/21 10:47 AM

## 2021-10-29 NOTE — Progress Notes (Signed)
New patient evaluation with Dr. Mickeal Skinner.

## 2021-10-29 NOTE — Progress Notes (Signed)
Patient in clinic today for initial evaluation with Dr. Mickeal Skinner at the request of Dr. Janese Banks.  Patient reports having nausea after his last treatment and he does not have antiemetic medication at home.  Discussed with Dr. Janese Banks and she would like a prescription for Compazine sent to pharmacy.  Prescription has been send and patient is aware.

## 2021-11-04 ENCOUNTER — Inpatient Hospital Stay (HOSPITAL_BASED_OUTPATIENT_CLINIC_OR_DEPARTMENT_OTHER): Payer: 59 | Admitting: Nurse Practitioner

## 2021-11-04 ENCOUNTER — Inpatient Hospital Stay: Payer: 59

## 2021-11-04 ENCOUNTER — Ambulatory Visit
Admission: RE | Admit: 2021-11-04 | Discharge: 2021-11-04 | Disposition: A | Discharge status: Home / Self Care | Payer: 59 | Source: Ambulatory Visit | Attending: Radiation Oncology | Admitting: Radiation Oncology

## 2021-11-04 ENCOUNTER — Encounter: Payer: Self-pay | Admitting: Nurse Practitioner

## 2021-11-04 ENCOUNTER — Ambulatory Visit: Payer: 59

## 2021-11-04 ENCOUNTER — Other Ambulatory Visit: Payer: 59

## 2021-11-04 ENCOUNTER — Telehealth: Payer: Self-pay | Admitting: *Deleted

## 2021-11-04 ENCOUNTER — Other Ambulatory Visit: Payer: Self-pay

## 2021-11-04 VITALS — BP 116/84 | HR 95 | Temp 97.0°F | Resp 17 | Wt 144.8 lb

## 2021-11-04 DIAGNOSIS — C3412 Malignant neoplasm of upper lobe, left bronchus or lung: Secondary | ICD-10-CM | POA: Diagnosis not present

## 2021-11-04 DIAGNOSIS — C3490 Malignant neoplasm of unspecified part of unspecified bronchus or lung: Secondary | ICD-10-CM

## 2021-11-04 DIAGNOSIS — C7951 Secondary malignant neoplasm of bone: Secondary | ICD-10-CM | POA: Insufficient documentation

## 2021-11-04 DIAGNOSIS — Z87891 Personal history of nicotine dependence: Secondary | ICD-10-CM | POA: Insufficient documentation

## 2021-11-04 DIAGNOSIS — C770 Secondary and unspecified malignant neoplasm of lymph nodes of head, face and neck: Secondary | ICD-10-CM | POA: Insufficient documentation

## 2021-11-04 DIAGNOSIS — Z9221 Personal history of antineoplastic chemotherapy: Secondary | ICD-10-CM | POA: Diagnosis not present

## 2021-11-04 LAB — BASIC METABOLIC PANEL
Anion gap: 9 (ref 5–15)
BUN: 11 mg/dL (ref 6–20)
CO2: 26 mmol/L (ref 22–32)
Calcium: 8.9 mg/dL (ref 8.9–10.3)
Chloride: 97 mmol/L — ABNORMAL LOW (ref 98–111)
Creatinine, Ser: 1.22 mg/dL (ref 0.61–1.24)
GFR, Estimated: >60 mL/min (ref >60)
Glucose, Bld: 138 mg/dL — ABNORMAL HIGH (ref 70–99)
Potassium: 3.4 mmol/L — ABNORMAL LOW (ref 3.5–5.1)
Sodium: 132 mmol/L — ABNORMAL LOW (ref 135–145)

## 2021-11-04 LAB — CBC WITH DIFFERENTIAL/PLATELET
Abs Immature Granulocytes: 0.33 10*3/uL — ABNORMAL HIGH (ref 0.00–0.07)
Basophils Absolute: 0.1 10*3/uL (ref 0.0–0.1)
Basophils Relative: 0 %
Eosinophils Absolute: 0.1 10*3/uL (ref 0.0–0.5)
Eosinophils Relative: 0 %
HCT: 33.9 % — ABNORMAL LOW (ref 39.0–52.0)
Hemoglobin: 11.2 g/dL — ABNORMAL LOW (ref 13.0–17.0)
Immature Granulocytes: 2 %
Lymphocytes Relative: 7 %
Lymphs Abs: 1.3 10*3/uL (ref 0.7–4.0)
MCH: 28.3 pg (ref 26.0–34.0)
MCHC: 33 g/dL (ref 30.0–36.0)
MCV: 85.6 fL (ref 80.0–100.0)
Monocytes Absolute: 1 10*3/uL (ref 0.1–1.0)
Monocytes Relative: 5 %
Neutro Abs: 15.7 10*3/uL — ABNORMAL HIGH (ref 1.7–7.7)
Neutrophils Relative %: 86 %
Platelets: 389 10*3/uL (ref 150–400)
RBC: 3.96 MIL/uL — ABNORMAL LOW (ref 4.22–5.81)
RDW: 14.5 % (ref 11.5–15.5)
WBC: 18.4 10*3/uL — ABNORMAL HIGH (ref 4.0–10.5)
nRBC: 0 % (ref 0.0–0.2)

## 2021-11-04 MED ORDER — SODIUM CHLORIDE 0.9 % IV SOLN
Freq: Once | INTRAVENOUS | Status: AC
  Filled 2021-11-04: qty 250

## 2021-11-04 MED ORDER — SODIUM CHLORIDE 0.9% FLUSH
10.0000 mL | Freq: Once | INTRAVENOUS | Status: AC
  Administered 2021-11-04: 10 mL via INTRAVENOUS
  Filled 2021-11-04: qty 10

## 2021-11-04 MED ORDER — HEPARIN SOD (PORK) LOCK FLUSH 100 UNIT/ML IV SOLN
500.0000 [IU] | Freq: Once | INTRAVENOUS | Status: AC
  Administered 2021-11-04: 500 [IU] via INTRAVENOUS
  Filled 2021-11-04: qty 5

## 2021-11-04 NOTE — Progress Notes (Signed)
Radiation Oncology Follow up Note  Name: Kyle Guerrero   Date:   11/04/2021 MRN:  [id number] DOB: Apr 23, 1971    This 50 y.o. male presents to the clinic today for 1 month follow-up status post salvage radiation therapy conserved cervical spine for metastatic squamous cell carcinoma treated for residual disease in his cervical spine status post instrumentation.  REFERRING PROVIDER: No ref. provider found  HPI: Patient is a 50 year old male now at 1 month of completed salvage radiation therapy to his cervical spine.  He did develop progressive metastatic disease in the cervical spine.  He had been treated initially with chemoradiation therapy for advanced disease then developed a 4.7 x 3.9 cm mass involving the left paravertebral space with destruction of C4-5-6 vertebral bodies.  Underwent anterior fusion instrumentation at C3-C7.  He had persistent ill-defined soft tissue mass on MRI postoperatively.  We retreated that area he is now seen at 1 month.  Continues to have weakness in his left upper extremity although with all the prior treatment and surgery that is not unusual.  He is working with PT.Marland Kitchen  His pain is under good control.  He is having no significant dysphagia at this time.  He is currently receivingEnhertu chemotherapy which she is tolerated well and had an infusion this morning.  COMPLICATIONS OF TREATMENT: none  FOLLOW UP COMPLIANCE: keeps appointments   PHYSICAL EXAM:  There were no vitals taken for this visit. Posterior incision is healing well.  No evidence of mass or nodularity in left supraclavicular region is noted.  Continues to have poor arm strength in his left upper extremity.  Well-developed well-nourished patient in NAD. HEENT reveals PERLA, EOMI, discs not visualized.  Oral cavity is clear. No oral mucosal lesions are identified. Neck is clear without evidence of cervical or supraclavicular adenopathy. Lungs are clear to A&P. Cardiac examination is essentially  unremarkable with regular rate and rhythm without murmur rub or thrill. Abdomen is benign with no organomegaly or masses noted. Motor sensory and DTR levels are equal and symmetric in the upper and lower extremities. Cranial nerves II through XII are grossly intact. Proprioception is intact. No peripheral adenopathy or edema is identified. No motor or sensory levels are noted. Crude visual fields are within normal range.  RADIOLOGY RESULTS: No current films for review  PLAN: This time patient continues chemotherapy treatment through medical oncology.  I will turn follow-up care over to medical oncology continues with PT at this point for his left upper extremity weakness.  I have assured him this is not secondary to anything he is doing but a result of nerve injury from tumor.  Patient knows to call with any concerns I be happy to reevaluate the patient in time should further treatment be indicated.  I would like to take this opportunity to thank you for allowing me to participate in the care of your patient.Noreene Filbert, MD

## 2021-11-04 NOTE — Patient Instructions (Signed)
Dehydration, Adult Dehydration is condition in which there is not enough water or other fluids in the body. This happens when a person loses more fluids than he or she takes in. Important body parts cannot work right without the right amount of fluids. Any loss of fluids from the body can cause dehydration. Dehydration can be mild, worse, or very bad. It should be treated right away to keep it from getting very bad. What are the causes? This condition may be caused by: Conditions that cause loss of water or other fluids, such as: Watery poop (diarrhea). Vomiting. Sweating a lot. Peeing (urinating) a lot. Not drinking enough fluids, especially when you: Are ill. Are doing things that take a lot of energy to do. Other illnesses and conditions, such as fever or infection. Certain medicines, such as medicines that take extra fluid out of the body (diuretics). Lack of safe drinking water. Not being able to get enough water and food. What increases the risk? The following factors may make you more likely to develop this condition: Having a long-term (chronic) illness that has not been treated the right way, such as: Diabetes. Heart disease. Kidney disease. Being 48 years of age or older. Having a disability. Living in a place that is high above the ground or sea (high in altitude). The thinner, dried air causes more fluid loss. Doing exercises that put stress on your body for a long time. What are the signs or symptoms? Symptoms of dehydration depend on how bad it is. Mild or worse dehydration Thirst. Dry lips or dry mouth. Feeling dizzy or light-headed, especially when you stand up from sitting. Muscle cramps. Your body making: Dark pee (urine). Pee may be the color of tea. Less pee than normal. Less tears than normal. Headache. Very bad dehydration Changes in skin. Skin may: Be cold to the touch (clammy). Be blotchy or pale. Not go back to normal right after you lightly pinch  it and let it go. Little or no tears, pee, or sweat. Changes in vital signs, such as: Fast breathing. Low blood pressure. Weak pulse. Pulse that is more than 100 beats a minute when you are sitting still. Other changes, such as: Feeling very thirsty. Eyes that look hollow (sunken). Cold hands and feet. Being mixed up (confused). Being very tired (lethargic) or having trouble waking from sleep. Short-term weight loss. Loss of consciousness. How is this treated? Treatment for this condition depends on how bad it is. Treatment should start right away. Do not wait until your condition gets very bad. Very bad dehydration is an emergency. You will need to go to a hospital. Mild or worse dehydration can be treated at home. You may be asked to: Drink more fluids. Drink an oral rehydration solution (ORS). This drink helps get the right amounts of fluids and salts and minerals in the blood (electrolytes). Very bad dehydration can be treated: With fluids through an IV tube. By getting normal levels of salts and minerals in your blood. This is often done by giving salts and minerals through a tube. The tube is passed through your nose and into your stomach. By treating the root cause. Follow these instructions at home: Oral rehydration solution If told by your doctor, drink an ORS: Make an ORS. Use instructions on the package. Start by drinking small amounts, about  cup (120 mL) every 5-10 minutes. Slowly drink more until you have had the amount that your doctor said to have. Eating and drinking  Drink enough clear fluid to keep your pee pale yellow. If you were told to drink an ORS, finish the ORS first. Then, start slowly drinking other clear fluids. Drink fluids such as: Water. Do not drink only water. Doing that can make the salt (sodium) level in your body get too low. Water from ice chips you suck on. Fruit juice that you have added water to (diluted). Low-calorie sports  drinks. Eat foods that have the right amounts of salts and minerals, such as: Bananas. Oranges. Potatoes. Tomatoes. Spinach. Do not drink alcohol. Avoid: Drinks that have a lot of sugar. These include: High-calorie sports drinks. Fruit juice that you did not add water to. Soda. Caffeine. Foods that are greasy or have a lot of fat or sugar. General instructions Take over-the-counter and prescription medicines only as told by your doctor. Do not take salt tablets. Doing that can make the salt level in your body get too high. Return to your normal activities as told by your doctor. Ask your doctor what activities are safe for you. Keep all follow-up visits as told by your doctor. This is important. Contact a doctor if: You have pain in your belly (abdomen) and the pain: Gets worse. Stays in one place. You have a rash. You have a stiff neck. You get angry or annoyed (irritable) more easily than normal. You are more tired or have a harder time waking than normal. You feel: Weak or dizzy. Very thirsty. Get help right away if you have: Any symptoms of very bad dehydration. Symptoms of vomiting, such as: You cannot eat or drink without vomiting. Your vomiting gets worse or does not go away. Your vomit has blood or green stuff in it. Symptoms that get worse with treatment. A fever. A very bad headache. Problems with peeing or pooping (having a bowel movement), such as: Watery poop that gets worse or does not go away. Blood in your poop (stool). This may cause poop to look black and tarry. Not peeing in 6-8 hours. Peeing only a small amount of very dark pee in 6-8 hours. Trouble breathing. These symptoms may be an emergency. Do not wait to see if the symptoms will go away. Get medical help right away. Call your local emergency services (911 in the [place] Do not drive yourself to the hospital. Summary Dehydration is a condition in which there is not enough water or other fluids  in the body. This happens when a person loses more fluids than he or she takes in. Treatment for this condition depends on how bad it is. Treatment should be started right away. Do not wait until your condition gets very bad. Drink enough clear fluid to keep your pee pale yellow. If you were told to drink an oral rehydration solution (ORS), finish the ORS first. Then, start slowly drinking other clear fluids. Take over-the-counter and prescription medicines only as told by your doctor. Get help right away if you have any symptoms of very bad dehydration. This information is not intended to replace advice given to you by your health care provider. Make sure you discuss any questions you have with your health care provider. Document Revised: 07/25/2019 Document Reviewed: 07/25/2019 Elsevier Patient Education  [employer].

## 2021-11-04 NOTE — Telephone Encounter (Signed)
Physical Therapist Durwin Reges @ [facility] called concerning patients inability to progress with 4 weeks of aggressive physical therapy.  She is concerned that further PT will not assist the patient.  Unsure if during the resection nerves may have been severed.  She states that during her assessment the patient was completely flaccid and had no sense of proprioception.  States Dr Janese Banks does not feel an EMG is warranted and she also saw that Dr Mickeal Skinner noted the same.  She feels an orthotic consult would possibly benefit him and wanted to know if he was agreeable to this plan.  Routed to provider

## 2021-11-04 NOTE — Progress Notes (Signed)
Received IV hydration this am. VSS. Discharged to home.

## 2021-11-04 NOTE — Progress Notes (Signed)
Pt here for hydration. Eating soups and ravioli. When he drinks ensure it causes bloating and gas (lactose intolerance) He feels that he is eating and drinking much better. Bowels regular BMs with a daily stool softener. Denies nausea. Occ takes pain pills. Has heaviness in left arm with loss of ROM from the tumor in his throat./neck.

## 2021-11-04 NOTE — Progress Notes (Signed)
Hematology/Oncology Consult Note [facility]  Telephone:([phone] Fax:([phone]  Patient Care Team: Default, Provider, MD as PCP - General Telford Nab, RN as Registered Nurse Sindy Guadeloupe, MD as Consulting Physician (Hematology and Oncology) Sindy Guadeloupe, MD as Consulting Physician (Hematology and Oncology)   Name of the patient: Kyle Guerrero  [id number]  [birth year]   Date of visit: 11/04/21  Diagnosis- Non-small cell lung cancer stage IV acT2 cN2 cM1 a with pleural involvement as well as large cervical neck mass   Chief complaint/ Reason for visit- follow up after cycle 1 of enhertu  Heme/Onc history: patient is a 50 year old male who presented to the ER with symptoms of heaviness in his chest and upper left chest discomfort he underwent CT angio chest to rule out PE which showed 3.8 x 3.3 cm left upper palpable lung mass along with 4.4 x 3.3 cm lobulated mass in the aortopulmonary window and 2.6 x 2.4 cm left hilar mass all concerning for malignancy.  Patient has also seen pulmonary and has been set up for bronchoscopy and EBUS guided biopsy on 11/23/2019.  Patient underwent.  PET CT scan which showed a hypermetabolic spiculated 3.5 cm of 5 left upper lobe lung mass, adjacent hypermetabolic 3.2 x 1.2 cm pleural metastases in the medial posterior by the left pleural space along with scalloping of the adjacent posterior left third rib.  Hypermetabolic infiltrative left perihilar conglomerate nodal metastases measuring up to 7.3 x 3.6 cm and 0.8 cm high left mediastinal node between the left brachiocephalic vein and left subclavian artery.  No evidence of distant metastatic disease   Biopsy showed non-small cell lung cancer but further characterization could not be determined.  Insufficient tissue for NGS testing. Repeat biopsy done. Results of NGS testing showed PD-L1 50%.  Tumor mutational burden high.  ERBB2 copy number again. 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    Patient completed concurrent chemoradiation with carbotaxol chemotherapy followed by 2 cycles of carbotaxol Keytruda and was on maintenance Keytruda   Patient was complaining of left shoulder pain and underwent MRI of the shoulder which showed tear in the supraspinatus tendon.  He was subsequently seen by [facility] and underwent MRI of the cervical spine which showed a 4.6 x 4.8 x 8.2 cm mass in the left prevertebral region from C2-C7 levels.  The mass partially encases the left vertebral artery and abuts the preforaminal segment.  Invades the vertebral bodies and edema of the left C5 articular pillar.  There is slight extension into the left C4-C5 thecal sac without central thecal sac compression.  The mass displaces the left pharynx anteriorly along the left carotid sheath.  Patient had a repeat biopsy which was consistent with adenocarcinoma.   Patient underwent palliative radiation treatment to his neck mass and completed 4 cycles of carboplatin and [name] with excellent response to treatment and is currently on maintenance [name].  Repeat NGS testing was also performed which did not show any evidence of actionable mutations other than ERBB2 gain   Patient had recurrence of his neck pain and repeat imaging showedInterim increase in the size of his neck mass and patient went for second opinion for both medical oncology as well as neurosurgery continue current was decided to cooperate upon this mass for definitive resection.  Patient underwent resection of cervical tumor which showed poorly differentiated metastatic adenocarcinoma on 08/29/2021.  TTF-1 negative.      Interval history- Patient is 50 year old male  who returns to clinic for labs and follow up after cycle 1 of enhertu on 10/25/21. Tolerated treatment well. Arm and neck pain has improved somewhat but continues to have limited range of motion. Otherwise feels well and denies complaints.   ECOG PS- 1 Pain  scale- 1 Opioid associated constipation- no  Review of systems- Review of Systems  Constitutional:  Negative for chills, fever, malaise/fatigue and weight loss.  HENT:  Negative for congestion, ear discharge and nosebleeds.   Eyes:  Negative for blurred vision.  Respiratory:  Negative for cough, hemoptysis, sputum production, shortness of breath and wheezing.   Cardiovascular:  Negative for chest pain, palpitations, orthopnea and claudication.  Gastrointestinal:  Negative for abdominal pain, blood in stool, constipation, diarrhea, heartburn, melena, nausea and vomiting.  Genitourinary:  Negative for dysuria, flank pain, frequency, hematuria and urgency.  Musculoskeletal:  Positive for back pain and neck pain. Negative for joint pain and myalgias.  Skin:  Negative for rash.  Neurological:  Negative for dizziness, tingling, focal weakness, seizures, weakness and headaches.       Left upper extremity weakness  Endo/Heme/Allergies:  Does not bruise/bleed easily.  Psychiatric/Behavioral:  Negative for depression and suicidal ideas. The patient does not have insomnia.       No Known Allergies   Past Medical History:  Diagnosis Date   Asthma    Lung cancer ([place])      Past Surgical History:  Procedure Laterality Date   CYST EXCISION     IR IMAGING GUIDED PORT INSERTION  12/05/2019   VIDEO BRONCHOSCOPY WITH ENDOBRONCHIAL ULTRASOUND Left 11/22/2019   Procedure: VIDEO BRONCHOSCOPY WITH ENDOBRONCHIAL ULTRASOUND;  Surgeon: Tyler Pita, MD;  Location: [facility] ORS;  Service: Thoracic;  Laterality: Left;    Social History   Socioeconomic History   Marital status: Single    Spouse name: Not on file   Number of children: Not on file   Years of education: Not on file   Highest education level: Not on file  Occupational History   Not on file  Tobacco Use   Smoking status: Former    Packs/day: 2.00    Types: Cigarettes    Quit date: 11/08/2019    Years since quitting: 1.9    Smokeless tobacco: Never  Vaping Use   Vaping Use: Never used  Substance and Sexual Activity   Alcohol use: Not Currently   Drug use: Yes    Types: Marijuana   Sexual activity: Not on file  Other Topics Concern   Not on file  Social History Narrative   Not on file   Social Determinants of Health   Financial Resource Strain: Not on file  Food Insecurity: Not on file  Transportation Needs: Not on file  Physical Activity: Not on file  Stress: Not on file  Social Connections: Not on file  Intimate Partner Violence: Not on file    Family History  Problem Relation Age of Onset   Healthy Mother    Healthy Father      Current Outpatient Medications:    baclofen (LIORESAL) 10 MG tablet, TAKE 1 TABLET BY MOUTH THREE TIMES A DAY, Disp: 90 tablet, Rfl: 1   fentaNYL (DURAGESIC) 12 MCG/HR, Place 1 patch onto the skin every 3 (three) days. (Patient not taking: Reported on 10/29/2021), Disp: 10 patch, Rfl: 0   metoprolol tartrate (LOPRESSOR) 25 MG tablet, Take 0.5 tablets (12.5 mg total) by mouth 2 (two) times daily., Disp: 30 tablet, Rfl: 0   oxyCODONE (OXY IR/ROXICODONE)  5 MG immediate release tablet, Take 1 tablet (5 mg total) by mouth every 4 (four) hours as needed for moderate pain or severe pain., Disp: 180 tablet, Rfl: 0   pregabalin (LYRICA) 75 MG capsule, Take 1 capsule (75 mg total) by mouth 2 (two) times daily., Disp: 60 capsule, Rfl: 2   prochlorperazine (COMPAZINE) 10 MG tablet, Take 1 tablet (10 mg total) by mouth every 6 (six) hours as needed (Nausea or vomiting)., Disp: 30 tablet, Rfl: 1   sucralfate (CARAFATE) 1 g tablet, Take by mouth., Disp: , Rfl:  No current facility-administered medications for this visit.  Facility-Administered Medications Ordered in Other Visits:    heparin lock flush 100 unit/mL, 500 Units, Intravenous, Once, Sindy Guadeloupe, MD   heparin lock flush 100 unit/mL, 500 Units, Intravenous, Once, Sindy Guadeloupe, MD   heparin lock flush 100 unit/mL, 500  Units, Intravenous, Once, Verlon Au, NP   sodium chloride flush (NS) 0.9 % injection 10 mL, 10 mL, Intravenous, PRN, Sindy Guadeloupe, MD, 10 mL at 05/01/20 0900   sodium chloride flush (NS) 0.9 % injection 10 mL, 10 mL, Intravenous, PRN, Sindy Guadeloupe, MD, 10 mL at 01/29/21 0835   sodium chloride flush (NS) 0.9 % injection 10 mL, 10 mL, Intravenous, Once, Verlon Au, NP  Physical exam:  There were no vitals filed for this visit.  Physical Exam Constitutional:      General: He is not in acute distress. Cardiovascular:     Rate and Rhythm: Normal rate and regular rhythm.     Heart sounds: Normal heart sounds.  Pulmonary:     Effort: Pulmonary effort is normal.     Breath sounds: Normal breath sounds.  Abdominal:     General: Bowel sounds are normal.     Palpations: Abdomen is soft.  Musculoskeletal:     Comments: Arm weakness has improved  Skin:    General: Skin is warm and dry.  Neurological:     Mental Status: He is alert and oriented to person, place, and time.     CMP Latest Ref Rng & Units 11/15/2021  Glucose 70 - 99 mg/dL 87  BUN 6 - 20 mg/dL 8  Creatinine 0.61 - 1.24 mg/dL 1.08  Sodium 135 - 145 mmol/L 131(L)  Potassium 3.5 - 5.1 mmol/L 3.7  Chloride 98 - 111 mmol/L 99  CO2 22 - 32 mmol/L 25  Calcium 8.9 - 10.3 mg/dL 8.7(L)  Total Protein 6.5 - 8.1 g/dL 7.6  Total Bilirubin 0.3 - 1.2 mg/dL 0.3  Alkaline Phos 38 - 126 U/L 96  AST 15 - 41 U/L 14(L)  ALT 0 - 44 U/L 14   CBC Latest Ref Rng & Units 11/15/2021  WBC 4.0 - 10.5 K/uL 8.7  Hemoglobin 13.0 - 17.0 g/dL 11.4(L)  Hematocrit 39.0 - 52.0 % 36.1(L)  Platelets 150 - 400 K/uL 531(H)    No images are attached to the encounter.  ECHOCARDIOGRAM COMPLETE  Result Date: 10/19/2021    ECHOCARDIOGRAM REPORT   Patient Name:   ADONIAS DEMORE Date of Exam: 10/19/2021 Medical Rec #:  [id number]         Height:       71.0 in Accession #:    [id number]        Weight:       146.6 lb Date of Birth:  1971-11-08          BSA:          1.848  m Patient Age:    45 years          BP:           137/98 mmHg Patient Gender: M                 HR:           110 bpm. Exam Location:  [facility] Procedure: 2D Echo, Color Doppler, Cardiac Doppler and Strain Analysis Indications:     Z09 Chemotherapy  History:         Patient has no prior history of Echocardiogram examinations.                  Malignant neoplasm of lung, unspecified laterality, unspecified                  part of lung.  Sonographer:     Charmayne Sheer Referring Phys:  [id number] Weston Anna RAO Diagnosing Phys: Nelva Bush MD  Sonographer Comments: Global longitudinal strain was attempted. IMPRESSIONS  1. Left ventricular ejection fraction, by estimation, is 55 to 60%. The left ventricle has normal function. Left ventricular endocardial border not optimally defined to evaluate regional wall motion. Left ventricular diastolic parameters are consistent with Grade I diastolic dysfunction (impaired relaxation).  2. Right ventricular systolic function is normal. The right ventricular size is normal. There is normal pulmonary artery systolic pressure.  3. The mitral valve is normal in structure. Trivial mitral valve regurgitation. No evidence of mitral stenosis.  4. The aortic valve is tricuspid. Aortic valve regurgitation is not visualized. No aortic stenosis is present.  5. The inferior vena cava is normal in size with greater than 50% respiratory variability, suggesting right atrial pressure of 3 mmHg. FINDINGS  Left Ventricle: Left ventricular ejection fraction, by estimation, is 55 to 60%. The left ventricle has normal function. Left ventricular endocardial border not optimally defined to evaluate regional wall motion. Global longitudinal strain performed but  not reported based on interpreter judgement due to suboptimal tracking. The left ventricular internal cavity size was normal in size. There is borderline left ventricular hypertrophy. Left ventricular diastolic parameters are  consistent with Grade I diastolic dysfunction (impaired relaxation). Right Ventricle: The right ventricular size is normal. No increase in right ventricular wall thickness. Right ventricular systolic function is normal. There is normal pulmonary artery systolic pressure. The tricuspid regurgitant velocity is 1.94 m/s, and  with an assumed right atrial pressure of 3 mmHg, the estimated right ventricular systolic pressure is 48.2 mmHg. Left Atrium: Left atrial size was normal in size. Right Atrium: Right atrial size was normal in size. Pericardium: There is no evidence of pericardial effusion. Mitral Valve: The mitral valve is normal in structure. Trivial mitral valve regurgitation. No evidence of mitral valve stenosis. MV peak gradient, 3.9 mmHg. The mean mitral valve gradient is 1.0 mmHg. Tricuspid Valve: The tricuspid valve is normal in structure. Tricuspid valve regurgitation is trivial. Aortic Valve: The aortic valve is tricuspid. Aortic valve regurgitation is not visualized. No aortic stenosis is present. Aortic valve mean gradient measures 2.0 mmHg. Aortic valve peak gradient measures 4.2 mmHg. Aortic valve area, by VTI measures 3.06 cm. Pulmonic Valve: The pulmonic valve was normal in structure. Pulmonic valve regurgitation is trivial. No evidence of pulmonic stenosis. Aorta: The aortic root is normal in size and structure. Pulmonary Artery: The pulmonary artery is not well seen. Venous: The inferior vena cava is normal in size with greater than 50% respiratory variability, suggesting right atrial pressure of  3 mmHg. IAS/Shunts: The interatrial septum was not well visualized.  LEFT VENTRICLE PLAX 2D LVIDd:         4.30 cm   Diastology LVIDs:         3.00 cm   LV e' medial:   5.44 cm/s LV PW:         1.19 cm   LV E/e' medial: 9.1 LV IVS:        0.96 cm LVOT diam:     2.20 cm LV SV:         44 LV SV Index:   24 LVOT Area:     3.80 cm  RIGHT VENTRICLE RV Basal diam:  2.90 cm LEFT ATRIUM             Index         RIGHT ATRIUM          Index LA diam:        2.40 cm 1.30 cm/m   RA Area:     9.21 cm LA Vol (A2C):   35.9 ml 19.43 ml/m  RA Volume:   19.10 ml 10.34 ml/m LA Vol (A4C):   18.3 ml 9.90 ml/m LA Biplane Vol: 26.0 ml 14.07 ml/m  AORTIC VALVE                    PULMONIC VALVE AV Area (Vmax):    3.13 cm     PV Vmax:          0.88 m/s AV Area (Vmean):   2.94 cm     PV Vmean:         68.200 cm/s AV Area (VTI):     3.06 cm     PV VTI:           0.142 m AV Vmax:           103.00 cm/s  PV Peak grad:     3.1 mmHg AV Vmean:          73.000 cm/s  PV Mean grad:     2.0 mmHg AV VTI:            0.143 m      PR End Diast Vel: 5.38 msec AV Peak Grad:      4.2 mmHg AV Mean Grad:      2.0 mmHg LVOT Vmax:         84.90 cm/s LVOT Vmean:        56.400 cm/s LVOT VTI:          0.115 m LVOT/AV VTI ratio: 0.80  AORTA Ao Root diam: 3.20 cm MITRAL VALVE               TRICUSPID VALVE MV Area (PHT): 8.07 cm    TR Peak grad:   15.1 mmHg MV Area VTI:   3.77 cm    TR Vmax:        194.00 cm/s MV Peak grad:  3.9 mmHg MV Mean grad:  1.0 mmHg    SHUNTS MV Vmax:       0.98 m/s    Systemic VTI:  0.12 m MV Vmean:      52.6 cm/s   Systemic Diam: 2.20 cm MV Decel Time: 94 msec MV E velocity: 49.70 cm/s MV A velocity: 82.30 cm/s MV E/A ratio:  0.60 Harrell Gave End MD Electronically signed by Nelva Bush MD Signature Date/Time: 10/19/2021/4:17:27 PM    Final      Assessment and plan- Patient  is a 50 y.o. male with recurrent metastatic adenocarcinoma of the lung stage IV presently with a large tumor involving the cervical spine s/p surgery and adjuvant radiation treatment with residual tumor. He returns to clinic for re-evaluation following cycle 1 of enhertu chemotherapy.   Tolerated cycle 1 well without significant side effects. Arm pain has improved post treatment. Continues PT. Eating and drinking well. No indication for IV fluids today. Continue home supportive care as prescribed.   Neoplasm related pain: We will start fentanyl patch 12  mcg and he will continue as needed oxycodone  Sinus tachycardia: resolved today.   Follow up with Dr. Janese Banks as scheduled for cycle 2   Visit Diagnosis 1. Malignant neoplasm of upper lobe of left lung ([place])   2. Spine metastasis ([place])    Beckey Rutter, DNP, AGNP-C [place] at [facility] [phone] (clinic) 11/04/2021

## 2021-11-12 ENCOUNTER — Inpatient Hospital Stay: Payer: 59

## 2021-11-12 NOTE — Progress Notes (Signed)
Nutrition Follow-up:  Patient with recurrent metastatic adenocarcinoma of lung.  Large tumor found in cervical spine with mass displacing the left pharynx, s/p surgery.  Patient has completed radiation and receiving chemotherapy.  Spoke with patient via phone.  Patient reports that his appetite is much better.  Reports that he ate 75% of hashbrowns, eggs, sausage, french toast this am.  Yesterday ate 75% of [place] food with rice and had soup for lunch.  Says that bacon and lettuce are hard for him to swallow but does well with other foods.  Ensure makes him bloated and have gas so can't drink too much of it.      Medications: reviewed  Labs: reviewed  Anthropometrics:   Weight 144 lb 12.8 oz on 11/10 146 lb on 10/14 153 lb on 9/21 174 lb on 7/26   NUTRITION DIAGNOSIS: Inadequate oral intake continues   INTERVENTION:  Continue high calorie, high protein foods for weight gain. If continues to loose weight would consider appetite stimulant trial.      MONITORING, EVALUATION, GOAL: weight trends, intake   NEXT VISIT: Wednesday, Dec 7 following MD appt  Mariusz Jubb B. Zenia Resides, [place], [place] Registered Dietitian [phone] (mobile)

## 2021-11-15 ENCOUNTER — Ambulatory Visit: Payer: 59 | Admitting: Radiation Oncology

## 2021-11-15 ENCOUNTER — Encounter: Payer: Self-pay | Admitting: Oncology

## 2021-11-15 ENCOUNTER — Inpatient Hospital Stay (HOSPITAL_BASED_OUTPATIENT_CLINIC_OR_DEPARTMENT_OTHER): Payer: 59 | Admitting: Oncology

## 2021-11-15 ENCOUNTER — Other Ambulatory Visit: Payer: Self-pay

## 2021-11-15 ENCOUNTER — Inpatient Hospital Stay: Payer: 59

## 2021-11-15 VITALS — BP 134/83 | HR 92 | Temp 96.8°F | Resp 18 | Wt 146.3 lb

## 2021-11-15 DIAGNOSIS — Z5112 Encounter for antineoplastic immunotherapy: Secondary | ICD-10-CM

## 2021-11-15 DIAGNOSIS — C3412 Malignant neoplasm of upper lobe, left bronchus or lung: Secondary | ICD-10-CM

## 2021-11-15 LAB — CBC WITH DIFFERENTIAL/PLATELET
Abs Immature Granulocytes: 0.22 K/uL — ABNORMAL HIGH (ref 0.00–0.07)
Basophils Absolute: 0.1 K/uL (ref 0.0–0.1)
Basophils Relative: 1 %
Eosinophils Absolute: 0.2 K/uL (ref 0.0–0.5)
Eosinophils Relative: 2 %
HCT: 36.1 % — ABNORMAL LOW (ref 39.0–52.0)
Hemoglobin: 11.4 g/dL — ABNORMAL LOW (ref 13.0–17.0)
Immature Granulocytes: 3 %
Lymphocytes Relative: 16 %
Lymphs Abs: 1.4 K/uL (ref 0.7–4.0)
MCH: 27.1 pg (ref 26.0–34.0)
MCHC: 31.6 g/dL (ref 30.0–36.0)
MCV: 86 fL (ref 80.0–100.0)
Monocytes Absolute: 1.1 K/uL — ABNORMAL HIGH (ref 0.1–1.0)
Monocytes Relative: 12 %
Neutro Abs: 5.9 K/uL (ref 1.7–7.7)
Neutrophils Relative %: 66 %
Platelets: 531 K/uL — ABNORMAL HIGH (ref 150–400)
RBC: 4.2 MIL/uL — ABNORMAL LOW (ref 4.22–5.81)
RDW: 15.7 % — ABNORMAL HIGH (ref 11.5–15.5)
WBC: 8.7 K/uL (ref 4.0–10.5)
nRBC: 0 % (ref 0.0–0.2)

## 2021-11-15 LAB — COMPREHENSIVE METABOLIC PANEL
ALT: 14 U/L (ref 0–44)
AST: 14 U/L — ABNORMAL LOW (ref 15–41)
Albumin: 3.4 g/dL — ABNORMAL LOW (ref 3.5–5.0)
Alkaline Phosphatase: 96 U/L (ref 38–126)
Anion gap: 7 (ref 5–15)
BUN: 8 mg/dL (ref 6–20)
CO2: 25 mmol/L (ref 22–32)
Calcium: 8.7 mg/dL — ABNORMAL LOW (ref 8.9–10.3)
Chloride: 99 mmol/L (ref 98–111)
Creatinine, Ser: 1.08 mg/dL (ref 0.61–1.24)
GFR, Estimated: >60 mL/min (ref >60)
Glucose, Bld: 87 mg/dL (ref 70–99)
Potassium: 3.7 mmol/L (ref 3.5–5.1)
Sodium: 131 mmol/L — ABNORMAL LOW (ref 135–145)
Total Bilirubin: 0.3 mg/dL (ref 0.3–1.2)
Total Protein: 7.6 g/dL (ref 6.5–8.1)

## 2021-11-15 MED ORDER — SODIUM CHLORIDE 0.9 % IV SOLN
10.0000 mg | Freq: Once | INTRAVENOUS | Status: AC
  Administered 2021-11-15: 10 mg via INTRAVENOUS
  Filled 2021-11-15: qty 10

## 2021-11-15 MED ORDER — PALONOSETRON HCL INJECTION 0.25 MG/5ML
0.2500 mg | Freq: Once | INTRAVENOUS | Status: AC
  Administered 2021-11-15: 0.25 mg via INTRAVENOUS
  Filled 2021-11-15: qty 5

## 2021-11-15 MED ORDER — ACETAMINOPHEN 325 MG PO TABS
650.0000 mg | ORAL_TABLET | Freq: Once | ORAL | Status: AC
  Administered 2021-11-15: 650 mg via ORAL
  Filled 2021-11-15: qty 2

## 2021-11-15 MED ORDER — HEPARIN SOD (PORK) LOCK FLUSH 100 UNIT/ML IV SOLN
500.0000 [IU] | Freq: Once | INTRAVENOUS | Status: AC | PRN
  Administered 2021-11-15: 500 [IU]
  Filled 2021-11-15: qty 5

## 2021-11-15 MED ORDER — FAM-TRASTUZUMAB DERUXTECAN-NXKI CHEMO 100 MG IV SOLR
5.4000 mg/kg | Freq: Once | INTRAVENOUS | Status: AC
  Administered 2021-11-15: 360 mg via INTRAVENOUS
  Filled 2021-11-15: qty 18

## 2021-11-15 MED ORDER — DEXTROSE 5 % IV SOLN
Freq: Once | INTRAVENOUS | Status: AC
  Filled 2021-11-15: qty 250

## 2021-11-15 MED ORDER — DIPHENHYDRAMINE HCL 25 MG PO CAPS
50.0000 mg | ORAL_CAPSULE | Freq: Once | ORAL | Status: AC
  Administered 2021-11-15: 50 mg via ORAL
  Filled 2021-11-15: qty 2

## 2021-11-15 NOTE — Patient Instructions (Signed)
[facility] [place] [facility]  Discharge Instructions: Thank you for choosing [facility] to provide your oncology and hematology care.  If you have a lab appointment with the Cancer Center, please go directly to the Cancer Center and check in at the registration area.  Wear comfortable clothing and clothing appropriate for easy access to any Portacath or PICC line.   We strive to give you quality time with your provider. You may need to reschedule your appointment if you arrive late (15 or more minutes).  Arriving late affects you and other patients whose appointments are after yours.  Also, if you miss three or more appointments without notifying the office, you may be dismissed from the clinic at the provider's discretion.      For prescription refill requests, have your pharmacy contact our office and allow 72 hours for refills to be completed.      To help prevent nausea and vomiting after your treatment, we encourage you to take your nausea medication as directed.  BELOW ARE SYMPTOMS THAT SHOULD BE REPORTED IMMEDIATELY: *FEVER GREATER THAN 100.4 F (38 C) OR HIGHER *CHILLS OR SWEATING *NAUSEA AND VOMITING THAT IS NOT CONTROLLED WITH YOUR NAUSEA MEDICATION *UNUSUAL SHORTNESS OF BREATH *UNUSUAL BRUISING OR BLEEDING *URINARY PROBLEMS (pain or burning when urinating, or frequent urination) *BOWEL PROBLEMS (unusual diarrhea, constipation, pain near the anus) TENDERNESS IN MOUTH AND THROAT WITH OR WITHOUT PRESENCE OF ULCERS (sore throat, sores in mouth, or a toothache) UNUSUAL RASH, SWELLING OR PAIN  UNUSUAL VAGINAL DISCHARGE OR ITCHING   Items with * indicate a potential emergency and should be followed up as soon as possible or go to the Emergency Department if any problems should occur.  Please show the CHEMOTHERAPY ALERT CARD or IMMUNOTHERAPY ALERT CARD at check-in to the Emergency Department and triage nurse.  Should you have questions after your  visit or need to cancel or reschedule your appointment, please contact [facility] [place] [facility]  [phone] and follow the prompts.  Office hours are 8:00 a.m. to 4:30 p.m. Monday - Friday. Please note that voicemails left after 4:00 p.m. may not be returned until the following business day.  We are closed weekends and major holidays. You have access to a nurse at all times for urgent questions. Please call the main number to the clinic [phone] and follow the prompts.  For any non-urgent questions, you may also contact your provider using MyChart. We now offer e-Visits for anyone 18 and older to request care online for non-urgent symptoms. For details visit [email or web address].   Also download the MyChart app! Go to the app store, search "MyChart", open the app, select [place], and log in with your MyChart username and password.  Due to Covid, a mask is required upon entering the hospital/clinic. If you do not have a mask, one will be given to you upon arrival. For doctor visits, patients may have 1 support person aged 18 or older with them. For treatment visits, patients cannot have anyone with them due to current Covid guidelines and our immunocompromised population.  

## 2021-11-15 NOTE — Progress Notes (Signed)
Hematology/Oncology Consult note [facility]  Telephone:([phone] [phone]  Patient Care Team: Default, Provider, MD as PCP - General Telford Nab, RN as Registered Nurse Sindy Guadeloupe, MD as Consulting Physician (Hematology and Oncology) Sindy Guadeloupe, MD as Consulting Physician (Hematology and Oncology)   Name of the patient: Kyle Guerrero  [id number]  March 29, 1971   Date of visit: 11/15/21  Diagnosis-  Non-small cell lung cancer stage IV acT2 cN2 cM1 a with pleural involvement as well as large cervical neck mass  Chief complaint/ Reason for visit-on treatment assessment prior to cycle 2 of Enhertu   Heme/Onc history: patient is a 50 year old male who presented to the ER with symptoms of heaviness in his chest and upper left chest discomfort he underwent CT angio chest to rule out PE which showed 3.8 x 3.3 cm left upper palpable lung mass along with 4.4 x 3.3 cm lobulated mass in the aortopulmonary window and 2.6 x 2.4 cm left hilar mass all concerning for malignancy.  Patient has also seen pulmonary and has been set up for bronchoscopy and EBUS guided biopsy on 11/23/2019.  Patient underwent.  PET CT scan which showed a hypermetabolic spiculated 3.5 cm of 5 left upper lobe lung mass, adjacent hypermetabolic 3.2 x 1.2 cm pleural metastases in the medial posterior by the left pleural space along with scalloping of the adjacent posterior left third rib.  Hypermetabolic infiltrative left perihilar conglomerate nodal metastases measuring up to 7.3 x 3.6 cm and 0.8 cm high left mediastinal node between the left brachiocephalic vein and left subclavian artery.  No evidence of distant metastatic disease   Biopsy showed non-small cell lung cancer but further characterization could not be determined.  Insufficient tissue for NGS testing. Repeat biopsy done. Results of NGS testing showed PD-L1 50%.  Tumor mutational burden high.  ERBB2 copy number again. 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    Patient completed concurrent chemoradiation with carbotaxol chemotherapy followed by 2 cycles of carbotaxol Keytruda and was on maintenance Keytruda   Patient was complaining of left shoulder pain and underwent MRI of the shoulder which showed tear in the supraspinatus tendon.  He was subsequently seen by [facility] and underwent MRI of the cervical spine which showed a 4.6 x 4.8 x 8.2 cm mass in the left prevertebral region from C2-C7 levels.  The mass partially encases the left vertebral artery and abuts the preforaminal segment.  Invades the vertebral bodies and edema of the left C5 articular pillar.  There is slight extension into the left C4-C5 thecal sac without central thecal sac compression.  The mass displaces the left pharynx anteriorly along the left carotid sheath.  Patient had a repeat biopsy which was consistent with adenocarcinoma.   Patient underwent palliative radiation treatment to his neck mass and completed 4 cycles of carboplatin and [name] with excellent response to treatment and is currently on maintenance [name].  Repeat NGS testing was also performed which did not show any evidence of actionable mutations other than ERBB2 gain   Patient had recurrence of his neck pain and repeat imaging showedInterim increase in the size of his neck mass and patient went for second opinion for both medical oncology as well as neurosurgery continue current was decided to cooperate upon this mass for definitive resection.  Patient underwent resection of cervical tumor which showed poorly differentiated metastatic adenocarcinoma on 08/29/2021.  TTF-1 negative.    Interval history-denies any significant left upper  extremity pain but no real improvement in muscle power.  He is able to use his hand muscles but not forearm or arm muscles.  Tolerated cycle 1 of Enhertu well without any significant side effects.  ECOG PS- 1 Pain scale- 0 Opioid  associated constipation- no  Review of systems- Review of Systems  Constitutional:  Positive for malaise/fatigue. Negative for chills, fever and weight loss.  HENT:  Negative for congestion, ear discharge and nosebleeds.   Eyes:  Negative for blurred vision.  Respiratory:  Negative for cough, hemoptysis, sputum production, shortness of breath and wheezing.   Cardiovascular:  Negative for chest pain, palpitations, orthopnea and claudication.  Gastrointestinal:  Negative for abdominal pain, blood in stool, constipation, diarrhea, heartburn, melena, nausea and vomiting.  Genitourinary:  Negative for dysuria, flank pain, frequency, hematuria and urgency.  Musculoskeletal:  Negative for back pain, joint pain and myalgias.  Skin:  Negative for rash.  Neurological:  Negative for dizziness, tingling, focal weakness, seizures, weakness and headaches.       Left upper extremity weakness  Endo/Heme/Allergies:  Does not bruise/bleed easily.  Psychiatric/Behavioral:  Negative for depression and suicidal ideas. The patient does not have insomnia.       No Known Allergies   Past Medical History:  Diagnosis Date   Asthma    Lung cancer ([place])      Past Surgical History:  Procedure Laterality Date   CYST EXCISION     IR IMAGING GUIDED PORT INSERTION  12/05/2019   VIDEO BRONCHOSCOPY WITH ENDOBRONCHIAL ULTRASOUND Left 11/22/2019   Procedure: VIDEO BRONCHOSCOPY WITH ENDOBRONCHIAL ULTRASOUND;  Surgeon: Tyler Pita, MD;  Location: [facility] ORS;  Service: Thoracic;  Laterality: Left;    Social History   Socioeconomic History   Marital status: Single    Spouse name: Not on file   Number of children: Not on file   Years of education: Not on file   Highest education level: Not on file  Occupational History   Not on file  Tobacco Use   Smoking status: Former    Packs/day: 2.00    Types: Cigarettes    Quit date: 11/08/2019    Years since quitting: 2.0   Smokeless tobacco: Never  Vaping  Use   Vaping Use: Never used  Substance and Sexual Activity   Alcohol use: Not Currently   Drug use: Yes    Types: Marijuana   Sexual activity: Not on file  Other Topics Concern   Not on file  Social History Narrative   Not on file   Social Determinants of Health   Financial Resource Strain: Not on file  Food Insecurity: Not on file  Transportation Needs: Not on file  Physical Activity: Not on file  Stress: Not on file  Social Connections: Not on file  Intimate Partner Violence: Not on file    Family History  Problem Relation Age of Onset   Healthy Mother    Healthy Father      Current Outpatient Medications:    baclofen (LIORESAL) 10 MG tablet, TAKE 1 TABLET BY MOUTH THREE TIMES A DAY, Disp: 90 tablet, Rfl: 1   docusate sodium (COLACE) 100 MG capsule, Take 100 mg by mouth daily., Disp: , Rfl:    metoprolol tartrate (LOPRESSOR) 25 MG tablet, Take 0.5 tablets (12.5 mg total) by mouth 2 (two) times daily., Disp: 30 tablet, Rfl: 0   oxyCODONE (OXY IR/ROXICODONE) 5 MG immediate release tablet, Take 1 tablet (5 mg total) by mouth every 4 (four) hours  as needed for moderate pain or severe pain., Disp: 180 tablet, Rfl: 0   pregabalin (LYRICA) 75 MG capsule, Take 1 capsule (75 mg total) by mouth 2 (two) times daily., Disp: 60 capsule, Rfl: 2   sucralfate (CARAFATE) 1 g tablet, Take by mouth., Disp: , Rfl:    fentaNYL (DURAGESIC) 12 MCG/HR, Place 1 patch onto the skin every 3 (three) days. (Patient not taking: Reported on 10/29/2021), Disp: 10 patch, Rfl: 0   prochlorperazine (COMPAZINE) 10 MG tablet, Take 1 tablet (10 mg total) by mouth every 6 (six) hours as needed (Nausea or vomiting). (Patient not taking: Reported on 11/04/2021), Disp: 30 tablet, Rfl: 1 No current facility-administered medications for this visit.  Facility-Administered Medications Ordered in Other Visits:    heparin lock flush 100 unit/mL, 500 Units, Intravenous, Once, Sindy Guadeloupe, MD   heparin lock flush 100  unit/mL, 500 Units, Intravenous, Once, Sindy Guadeloupe, MD   sodium chloride flush (NS) 0.9 % injection 10 mL, 10 mL, Intravenous, PRN, Sindy Guadeloupe, MD, 10 mL at 05/01/20 0900   sodium chloride flush (NS) 0.9 % injection 10 mL, 10 mL, Intravenous, PRN, Sindy Guadeloupe, MD, 10 mL at 01/29/21 0835  Physical exam:  Vitals:   11/15/21 0931  BP: 134/83  Pulse: 92  Resp: 18  Temp: (!) 96.8 F (36 C)  SpO2: 100%  Weight: 146 lb 4.8 oz (66.4 kg)   Physical Exam Constitutional:      General: He is not in acute distress.    Comments: Left upper extremity in a sling with no significant power  Cardiovascular:     Rate and Rhythm: Normal rate and regular rhythm.     Heart sounds: Normal heart sounds.  Pulmonary:     Effort: Pulmonary effort is normal.     Breath sounds: Normal breath sounds.  Abdominal:     General: Bowel sounds are normal.     Palpations: Abdomen is soft.  Skin:    General: Skin is warm and dry.  Neurological:     Mental Status: He is alert and oriented to person, place, and time.     CMP Latest Ref Rng & Units 11/15/2021  Glucose 70 - 99 mg/dL 87  BUN 6 - 20 mg/dL 8  Creatinine 0.61 - 1.24 mg/dL 1.08  Sodium 135 - 145 mmol/L 131(L)  Potassium 3.5 - 5.1 mmol/L 3.7  Chloride 98 - 111 mmol/L 99  CO2 22 - 32 mmol/L 25  Calcium 8.9 - 10.3 mg/dL 8.7(L)  Total Protein 6.5 - 8.1 g/dL 7.6  Total Bilirubin 0.3 - 1.2 mg/dL 0.3  Alkaline Phos 38 - 126 U/L 96  AST 15 - 41 U/L 14(L)  ALT 0 - 44 U/L 14   CBC Latest Ref Rng & Units 11/15/2021  WBC 4.0 - 10.5 K/uL 8.7  Hemoglobin 13.0 - 17.0 g/dL 11.4(L)  Hematocrit 39.0 - 52.0 % 36.1(L)  Platelets 150 - 400 K/uL 531(H)    No images are attached to the encounter.  ECHOCARDIOGRAM COMPLETE  Result Date: 10/19/2021    ECHOCARDIOGRAM REPORT   Patient Name:   LORI LIEW Date of Exam: 10/19/2021 Medical Rec #:  [id number]         Height:       71.0 in Accession #:    [id number]        Weight:       146.6 lb Date of  Birth:  22-Dec-1971  BSA:          1.848 m Patient Age:    50 years          BP:           137/98 mmHg Patient Gender: M                 HR:           110 bpm. Exam Location:  [facility] Procedure: 2D Echo, Color Doppler, Cardiac Doppler and Strain Analysis Indications:     Z09 Chemotherapy  History:         Patient has no prior history of Echocardiogram examinations.                  Malignant neoplasm of lung, unspecified laterality, unspecified                  part of lung.  Sonographer:     Charmayne Sheer Referring Phys:  [id number] Weston Anna Giavanna Kang Diagnosing Phys: Nelva Bush MD  Sonographer Comments: Global longitudinal strain was attempted. IMPRESSIONS  1. Left ventricular ejection fraction, by estimation, is 55 to 60%. The left ventricle has normal function. Left ventricular endocardial border not optimally defined to evaluate regional wall motion. Left ventricular diastolic parameters are consistent with Grade I diastolic dysfunction (impaired relaxation).  2. Right ventricular systolic function is normal. The right ventricular size is normal. There is normal pulmonary artery systolic pressure.  3. The mitral valve is normal in structure. Trivial mitral valve regurgitation. No evidence of mitral stenosis.  4. The aortic valve is tricuspid. Aortic valve regurgitation is not visualized. No aortic stenosis is present.  5. The inferior vena cava is normal in size with greater than 50% respiratory variability, suggesting right atrial pressure of 3 mmHg. FINDINGS  Left Ventricle: Left ventricular ejection fraction, by estimation, is 55 to 60%. The left ventricle has normal function. Left ventricular endocardial border not optimally defined to evaluate regional wall motion. Global longitudinal strain performed but  not reported based on interpreter judgement due to suboptimal tracking. The left ventricular internal cavity size was normal in size. There is borderline left ventricular hypertrophy. Left ventricular  diastolic parameters are consistent with Grade I diastolic dysfunction (impaired relaxation). Right Ventricle: The right ventricular size is normal. No increase in right ventricular wall thickness. Right ventricular systolic function is normal. There is normal pulmonary artery systolic pressure. The tricuspid regurgitant velocity is 1.94 m/s, and  with an assumed right atrial pressure of 3 mmHg, the estimated right ventricular systolic pressure is 42.5 mmHg. Left Atrium: Left atrial size was normal in size. Right Atrium: Right atrial size was normal in size. Pericardium: There is no evidence of pericardial effusion. Mitral Valve: The mitral valve is normal in structure. Trivial mitral valve regurgitation. No evidence of mitral valve stenosis. MV peak gradient, 3.9 mmHg. The mean mitral valve gradient is 1.0 mmHg. Tricuspid Valve: The tricuspid valve is normal in structure. Tricuspid valve regurgitation is trivial. Aortic Valve: The aortic valve is tricuspid. Aortic valve regurgitation is not visualized. No aortic stenosis is present. Aortic valve mean gradient measures 2.0 mmHg. Aortic valve peak gradient measures 4.2 mmHg. Aortic valve area, by VTI measures 3.06 cm. Pulmonic Valve: The pulmonic valve was normal in structure. Pulmonic valve regurgitation is trivial. No evidence of pulmonic stenosis. Aorta: The aortic root is normal in size and structure. Pulmonary Artery: The pulmonary artery is not well seen. Venous: The inferior vena cava is normal in size  with greater than 50% respiratory variability, suggesting right atrial pressure of 3 mmHg. IAS/Shunts: The interatrial septum was not well visualized.  LEFT VENTRICLE PLAX 2D LVIDd:         4.30 cm   Diastology LVIDs:         3.00 cm   LV e' medial:   5.44 cm/s LV PW:         1.19 cm   LV E/e' medial: 9.1 LV IVS:        0.96 cm LVOT diam:     2.20 cm LV SV:         44 LV SV Index:   24 LVOT Area:     3.80 cm  RIGHT VENTRICLE RV Basal diam:  2.90 cm LEFT ATRIUM              Index        RIGHT ATRIUM          Index LA diam:        2.40 cm 1.30 cm/m   RA Area:     9.21 cm LA Vol (A2C):   35.9 ml 19.43 ml/m  RA Volume:   19.10 ml 10.34 ml/m LA Vol (A4C):   18.3 ml 9.90 ml/m LA Biplane Vol: 26.0 ml 14.07 ml/m  AORTIC VALVE                    PULMONIC VALVE AV Area (Vmax):    3.13 cm     PV Vmax:          0.88 m/s AV Area (Vmean):   2.94 cm     PV Vmean:         68.200 cm/s AV Area (VTI):     3.06 cm     PV VTI:           0.142 m AV Vmax:           103.00 cm/s  PV Peak grad:     3.1 mmHg AV Vmean:          73.000 cm/s  PV Mean grad:     2.0 mmHg AV VTI:            0.143 m      PR End Diast Vel: 5.38 msec AV Peak Grad:      4.2 mmHg AV Mean Grad:      2.0 mmHg LVOT Vmax:         84.90 cm/s LVOT Vmean:        56.400 cm/s LVOT VTI:          0.115 m LVOT/AV VTI ratio: 0.80  AORTA Ao Root diam: 3.20 cm MITRAL VALVE               TRICUSPID VALVE MV Area (PHT): 8.07 cm    TR Peak grad:   15.1 mmHg MV Area VTI:   3.77 cm    TR Vmax:        194.00 cm/s MV Peak grad:  3.9 mmHg MV Mean grad:  1.0 mmHg    SHUNTS MV Vmax:       0.98 m/s    Systemic VTI:  0.12 m MV Vmean:      52.6 cm/s   Systemic Diam: 2.20 cm MV Decel Time: 94 msec MV E velocity: 49.70 cm/s MV A velocity: 82.30 cm/s MV E/A ratio:  0.60 Harrell Gave End MD Electronically signed by Nelva Bush MD Signature Date/Time: 10/19/2021/4:17:27 PM  Final      Assessment and plan- Patient is a 50 y.o. male with recurrent metastatic adenocarcinoma of the lung stage IV presently with a large tumor involving the cervical spine s/p surgery and adjuvant radiation treatment with residual tumor.  He is here for on treatment assessment prior to cycle 2 of Enhertu  Counts okay to proceed with cycle 2 of Enhertu today with Udenyca on day 3.  Return to clinic in 3 weeks and he will be seen by covering NP for cycle 3 of treatment.  He already has upcoming CT scan scheduled.  I will see him in 6 weeks for cycle 4.  Plan is to  continue treatment until progression or toxicity.  Left upper extremity weakness: Secondary to large cervical tumor.  He was evaluated by Dr. Mickeal Skinner from neurology as well.  He has already undergone a second surgery as well as radiation.  He will be resuming physical therapy after nerve conduction studies but it is unclear if he would ever recover his left upper extremity function and to what extent   Visit Diagnosis 1. Encounter for monoclonal antibody treatment for malignancy   2. Malignant neoplasm of upper lobe of left lung ([place])      Dr. Randa Evens, MD, MPH [facility] [phone] 11/15/2021 12:31 PM

## 2021-11-15 NOTE — Progress Notes (Signed)
CMP is still pending at this time. MD notified prior to releasing orders. Per Dr. Janese Banks, [place] to proceed with treatment.

## 2021-11-17 ENCOUNTER — Inpatient Hospital Stay: Payer: 59

## 2021-11-17 ENCOUNTER — Other Ambulatory Visit: Payer: Self-pay | Admitting: Oncology

## 2021-11-24 ENCOUNTER — Other Ambulatory Visit: Payer: Self-pay | Admitting: Oncology

## 2021-11-25 ENCOUNTER — Encounter: Payer: Self-pay | Admitting: Oncology

## 2021-11-29 ENCOUNTER — Encounter: Payer: Self-pay | Admitting: Oncology

## 2021-11-30 ENCOUNTER — Ambulatory Visit: Admission: RE | Admit: 2021-11-30 | Payer: 59 | Source: Ambulatory Visit

## 2021-11-30 ENCOUNTER — Other Ambulatory Visit: Payer: 59

## 2021-12-01 ENCOUNTER — Other Ambulatory Visit: Payer: 59

## 2021-12-01 ENCOUNTER — Ambulatory Visit: Payer: 59 | Admitting: Oncology

## 2021-12-01 ENCOUNTER — Other Ambulatory Visit: Payer: Self-pay | Admitting: Oncology

## 2021-12-03 ENCOUNTER — Other Ambulatory Visit: Payer: Self-pay

## 2021-12-03 ENCOUNTER — Ambulatory Visit
Admission: RE | Admit: 2021-12-03 | Discharge: 2021-12-03 | Disposition: A | Discharge status: Home / Self Care | Payer: 59 | Source: Ambulatory Visit | Attending: Oncology | Admitting: Oncology

## 2021-12-03 ENCOUNTER — Ambulatory Visit: Admission: RE | Admit: 2021-12-03 | Payer: Medicaid Other | Source: Ambulatory Visit

## 2021-12-03 DIAGNOSIS — C3490 Malignant neoplasm of unspecified part of unspecified bronchus or lung: Secondary | ICD-10-CM | POA: Diagnosis present

## 2021-12-03 IMAGING — CT CT CHEST-ABD-PELV W/ CM
2 of 5 series · 11 of 36 positions shown, 12 images · IV contrast (omnipaque)
Comparison: [DATE], [DATE], PET-CT [DATE]

CLINICAL DATA: History of left upper lobe lung cancer for
restaging. Non-small cell lung cancer left upper lobe diagnosed
[DATE] post chemotherapy and radiation treatment. Patient on
Keytruda maintenance injections monthly.

EXAM:
CT CHEST, ABDOMEN, AND PELVIS WITH CONTRAST
TECHNIQUE: Multidetector CT imaging of the chest, abdomen and pelvis was
performed following the standard protocol during bolus
administration of intravenous contrast.
CONTRAST:  100mL OMNIPAQUE IOHEXOL 300 MG/ML  SOLN

[Series 3: cap with 5.00 ax · axial · 0.62mm/px · z∈[-1287,-702]mm · 8 of 144 slices shown, 9 images]
[im 14/144  mediastinal]
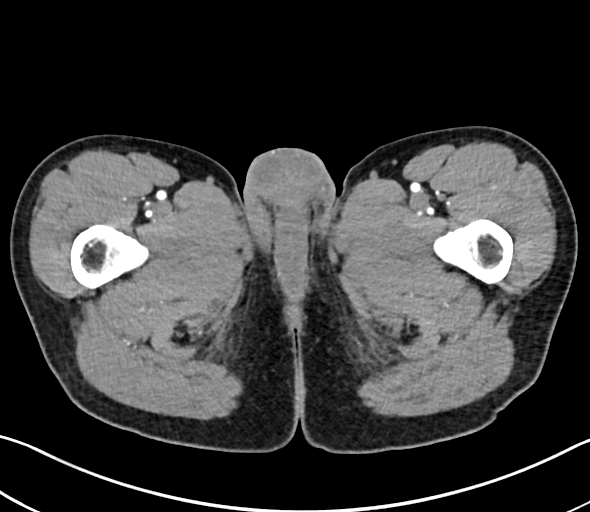
[im 14/144  bone]
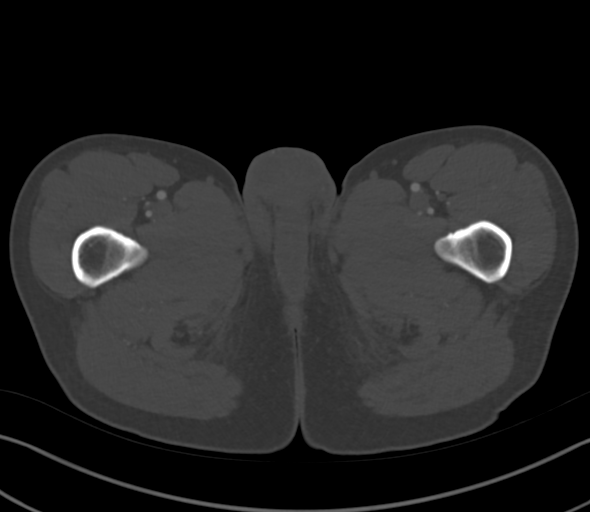
[im 27/144  mediastinal]
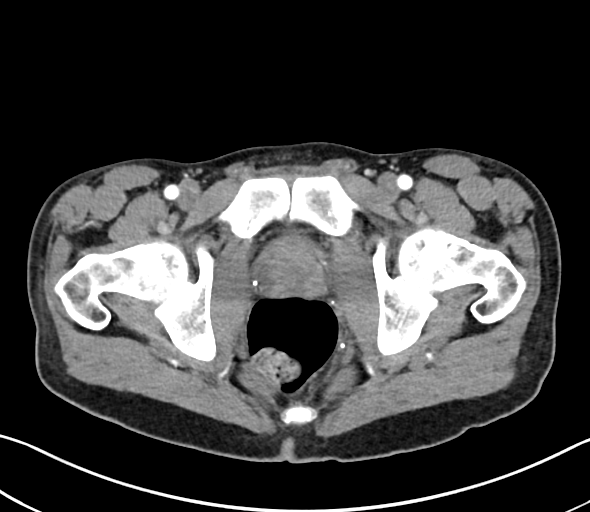
[im 53/144  mediastinal]
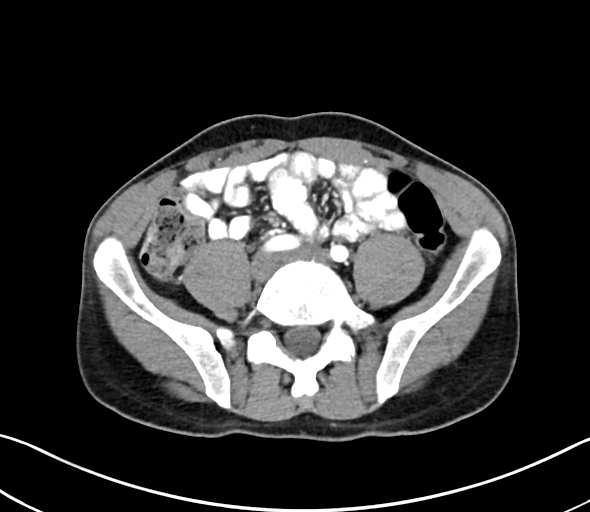
[im 66/144  mediastinal]
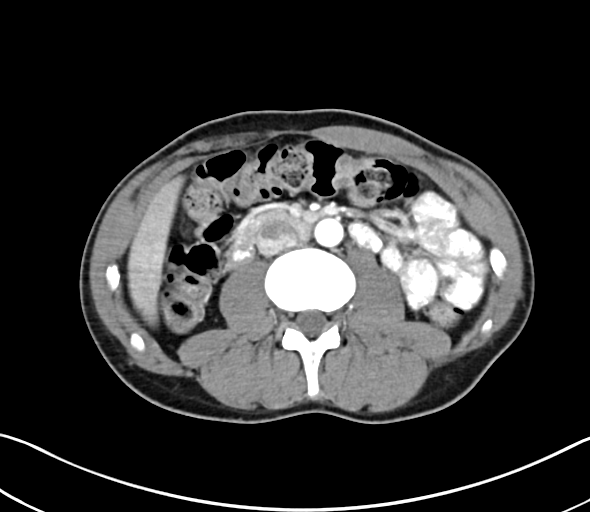
[im 79/144  mediastinal]
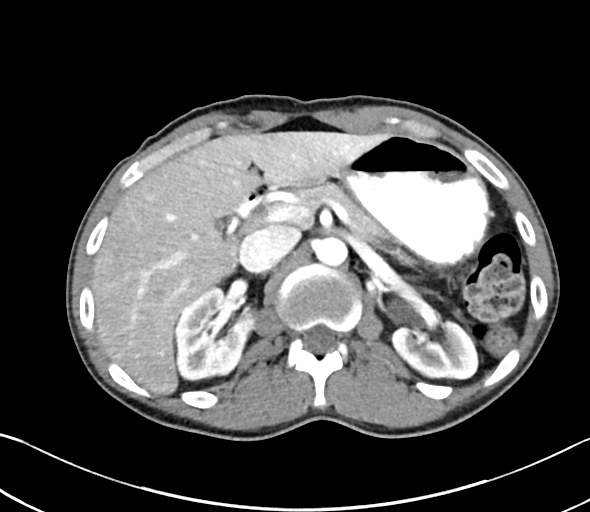
[im 92/144  mediastinal]
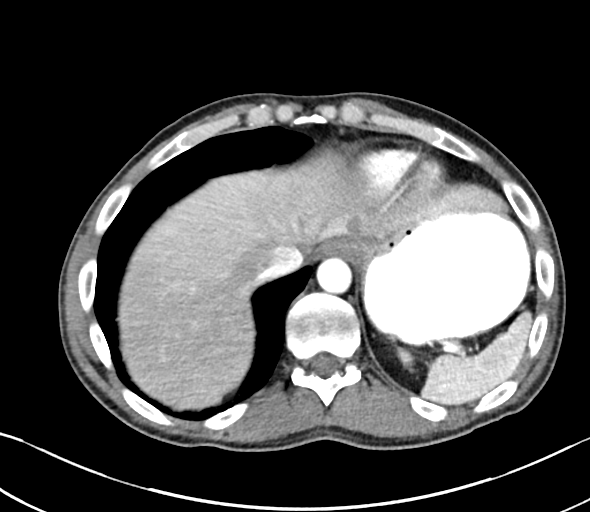
[im 118/144  mediastinal]
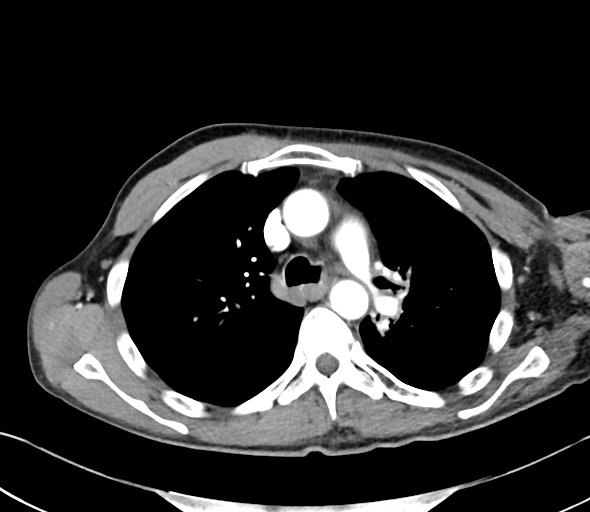
[im 131/144  mediastinal]
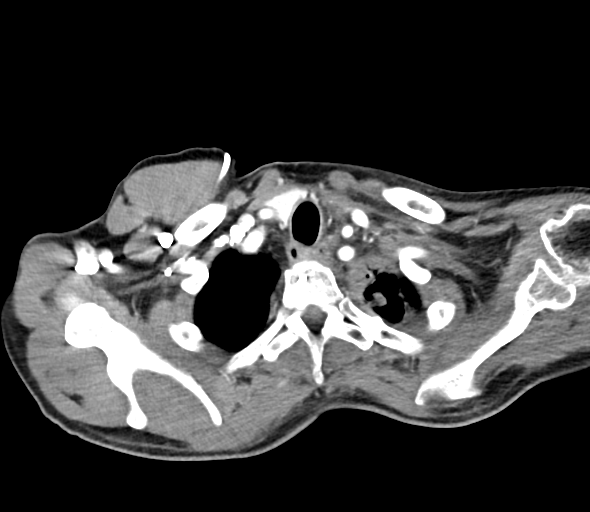

[Series 7: coronal cap with 2.00 cor · coronal · 0.71mm/px · 3 of 124 slices shown]
[im 25/124  mediastinal]
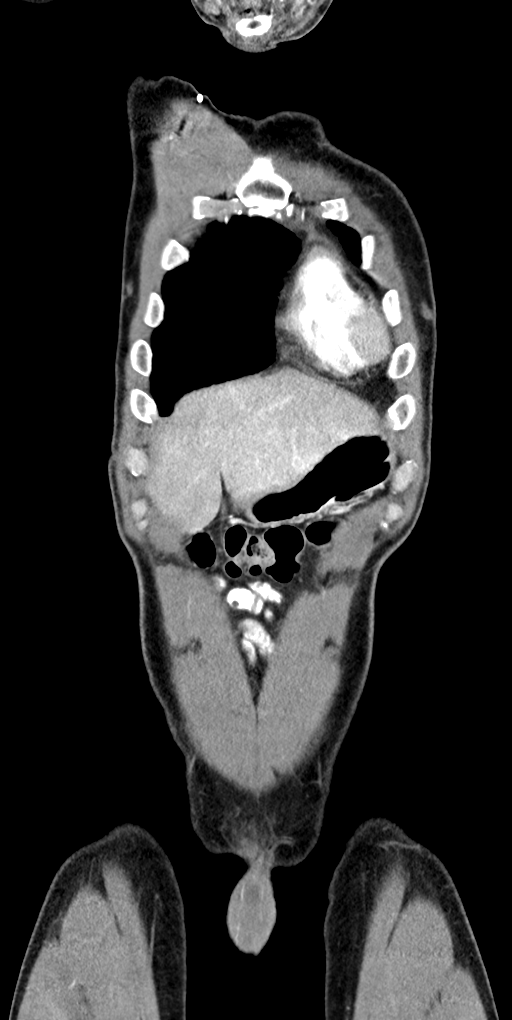
[im 50/124  mediastinal]
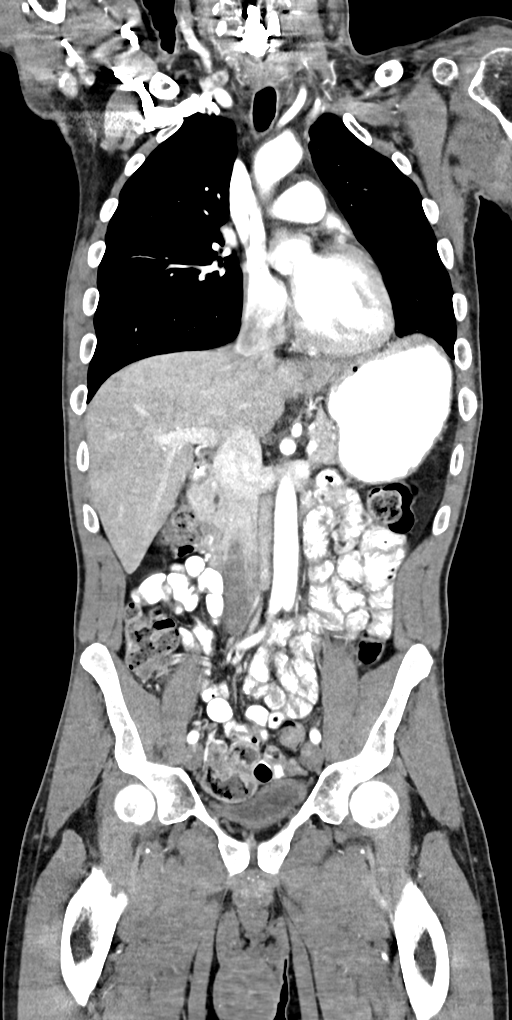
[im 74/124  mediastinal]
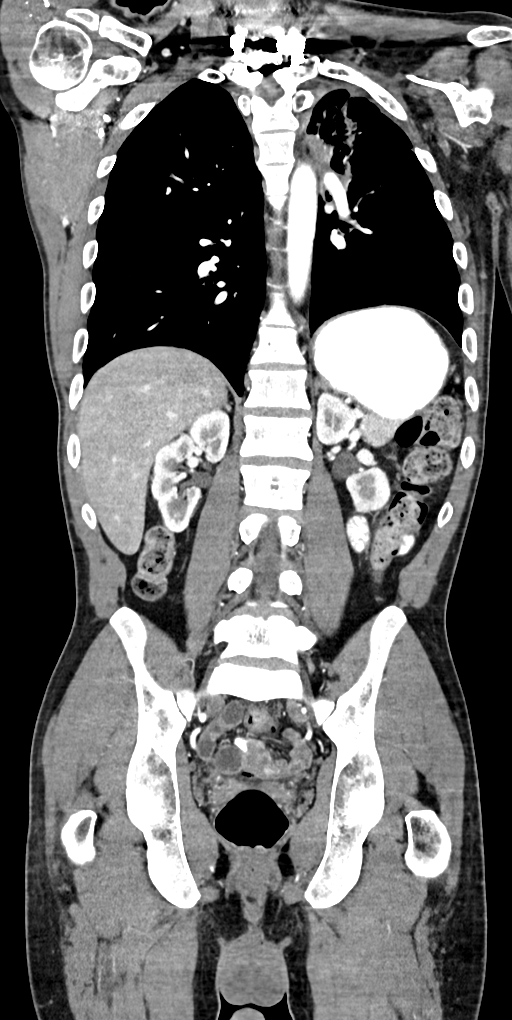

[11 of 36 positions shown; findings below may reference images not displayed]

FINDINGS: CT CHEST FINDINGS

Cardiovascular: Right upper chest wall Port-A-Cath with tip at the
cavoatrial junction. Heart is normal size. Thoracic aorta is normal
in caliber. Remaining vascular structures are unremarkable.

Mediastinum/Nodes: No mediastinal or hilar adenopathy. Remaining
mediastinal structures are unremarkable.

Lungs/Pleura: Mild stable volume loss left lung with subtle
elevation of the left hemidiaphragm. Stable 1.4 cm oval nodule over
the left apex. Adjacent right paramediastinal/apical consolidation
unchanged likely due to post radiation/post treatment changes. Lung
is clear. Airways unremarkable.

Musculoskeletal: Anterior and posterior fusion hardware over the
cervicothoracic spine causing moderate streak artifact. Stable
fractures of the left posterior third and fourth ribs likely related
to radiation treatment.

CT ABDOMEN PELVIS FINDINGS

Hepatobiliary: Liver, gallbladder and biliary tree are normal.

Pancreas: Normal.

Spleen: Normal.

Adrenals/Urinary Tract: Adrenal glands are normal. Kidneys are
normal in size without hydronephrosis or nephrolithiasis. Patchy
areas of low attenuation over the renal cortex on the delayed phase
images unchanged from [DATE]. Bladder and ureters are otherwise
unremarkable.

Stomach/Bowel: Mild gastric distension. Subtle wall thickening
involving the third portion of the duodenum without significant
change. Small bowel is otherwise unremarkable. Mild fecal retention
throughout the colon which is otherwise unremarkable. Appendix not
visualized.

Vascular/Lymphatic: Minimal calcified plaque over the abdominal
aorta which is normal in caliber. Remaining vascular structures are
unremarkable. No adenopathy.

Reproductive: Normal.

Other: No free fluid.

Musculoskeletal: No focal abnormality.
IMPRESSION: 1. No evidence of metastatic disease within the chest, abdomen or
pelvis.
2. Stable 1.4 cm oval nodule over the left apex. Stable adjacent
right paramediastinal/apical consolidation likely due to post
radiation/post treatment changes. Stable fractures of the left
posterior third and fourth ribs likely related to prior radiation
treatment.
3. Patchy areas of low attenuation over the renal cortex unchanged
from [DATE].
4. Aortic atherosclerosis.

Aortic Atherosclerosis ([Q1]-[Q1]).

## 2021-12-03 IMAGING — CT CT NECK W/ CM
5 series · 16 of 33 positions shown, 18 images · IV contrast (omnipaque)
Comparison: [DATE]

CLINICAL DATA: Restaging of non-small cell lung carcinoma

EXAM:
CT NECK WITH CONTRAST
TECHNIQUE: Multidetector CT imaging of the neck was performed using the
standard protocol following the bolus administration of intravenous
contrast.
CONTRAST:  100mL OMNIPAQUE IOHEXOL 300 MG/ML  SOLN

[Series 3: axials neck (person_name) 2.00 · axial · 0.57mm/px · z∈[-686,-604]mm · 2 of 124 slices shown]
[im 42/124  bone]
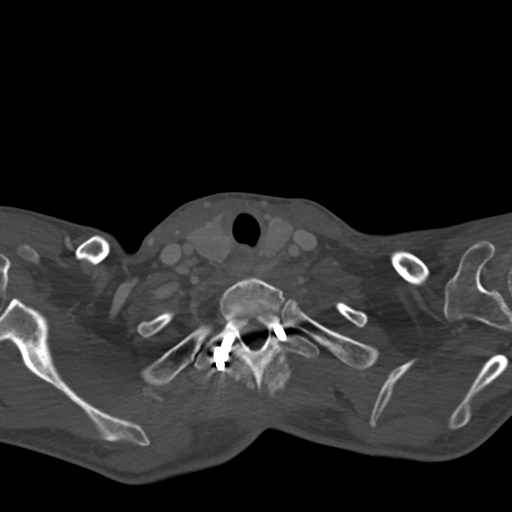
[im 83/124  bone]
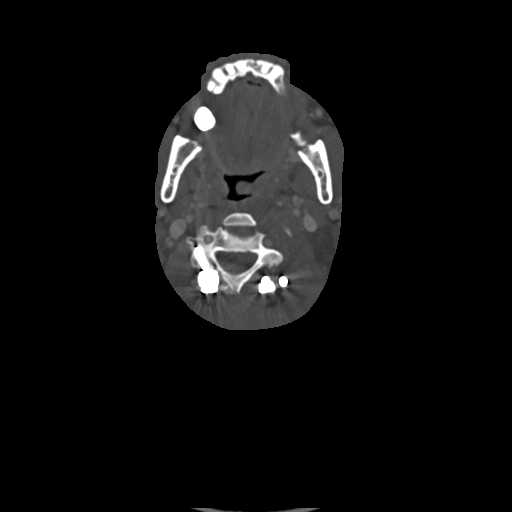

[Series 4: bone windows neck 2.00 · axial · 0.57mm/px · z∈[-686,-604]mm · 2 of 124 slices shown]
[im 42/124  bone]
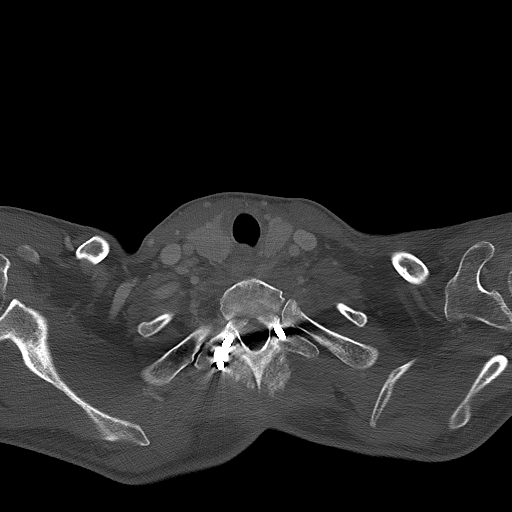
[im 83/124  bone]
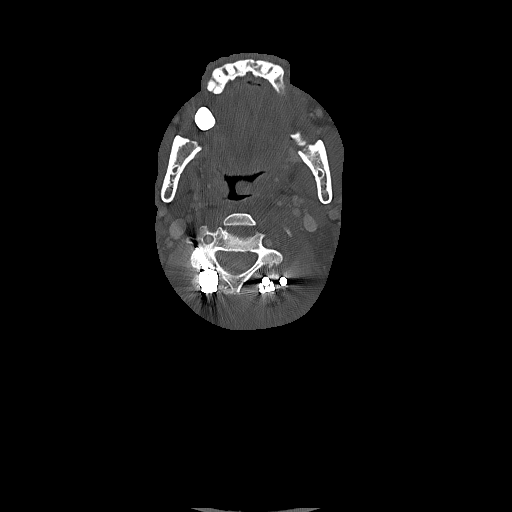

[Series 5: coronals neck (person_name) 2.00 cor · coronal · 0.57mm/px · 3 of 145 slices shown]
[im 45/145  bone]
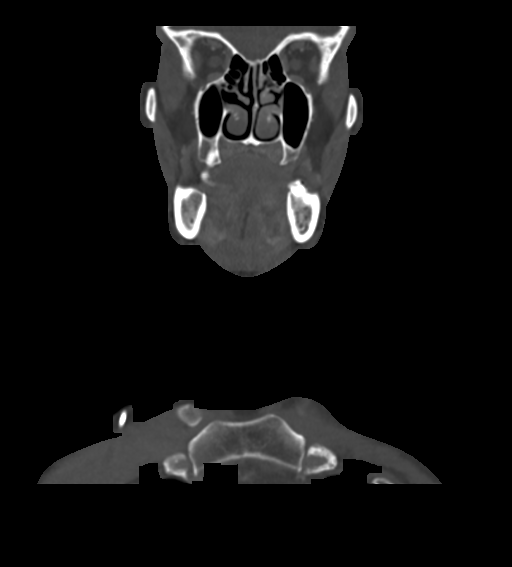
[im 63/145  bone]
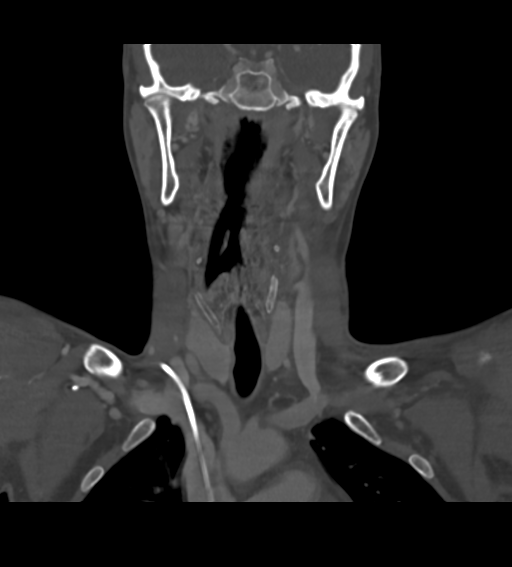
[im 82/145  bone]
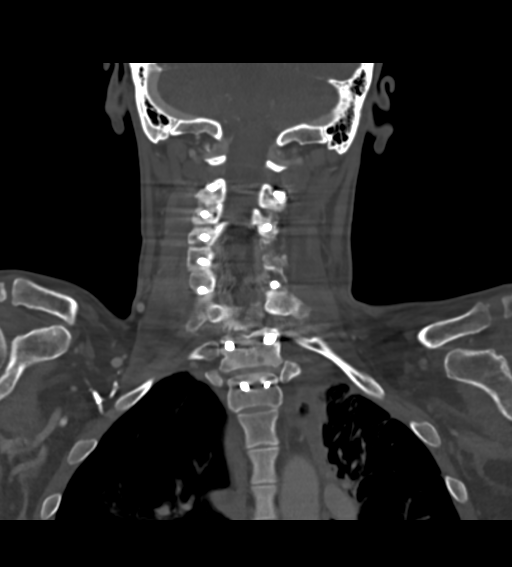

[Series 7: sagittals neck (person_name) 2.00 sag · sagittal · 0.57mm/px · 5 of 145 slices shown, 6 images]
[im 49/145  bone]
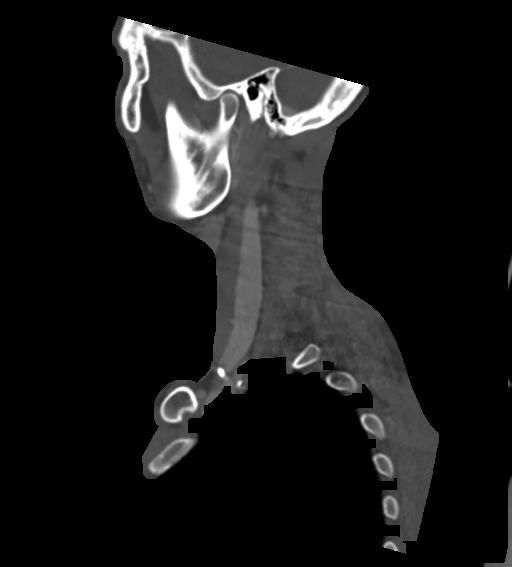
[im 61/145  bone]
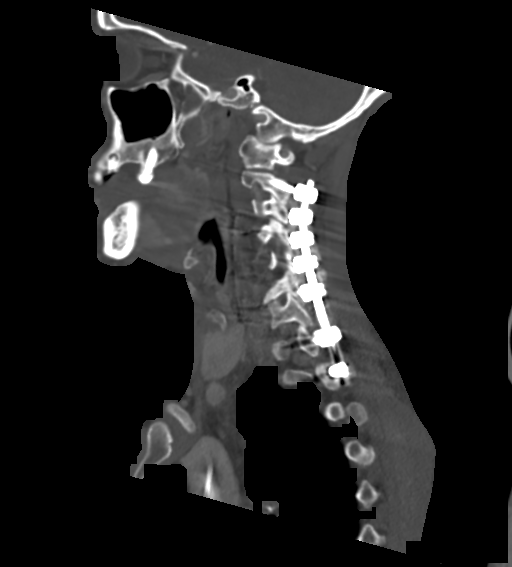
[im 73/145  soft-tissue]
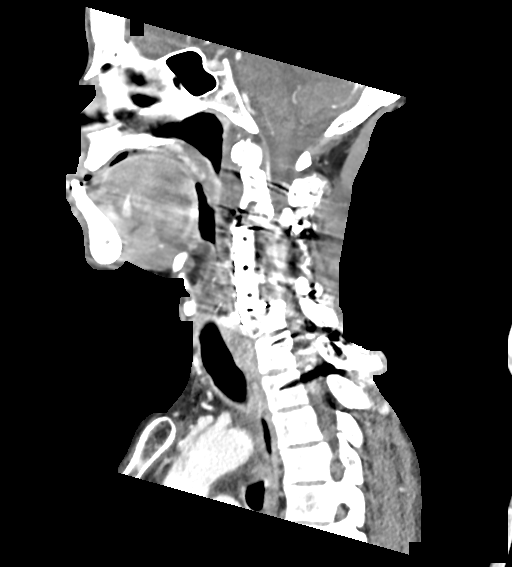
[im 73/145  bone]
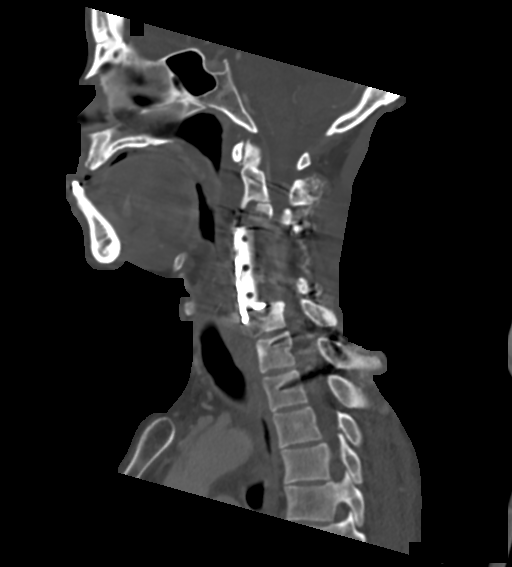
[im 85/145  bone]
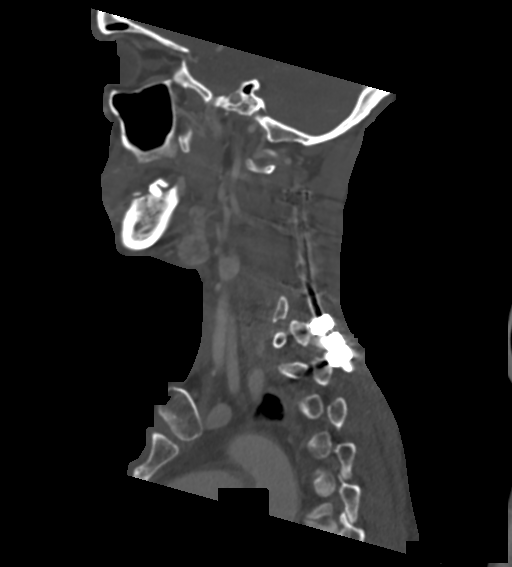
[im 97/145  bone]
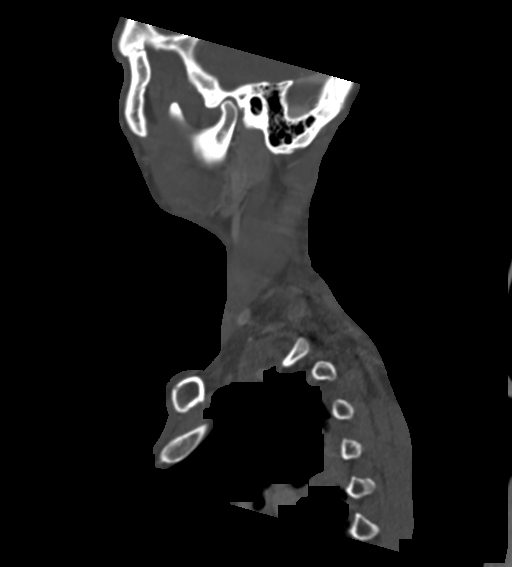

[Series 9: ax oropharynx neck (person_name) 2.00 ax · axial · 0.57mm/px · z∈[-777,-592]mm · 4 of 161 slices shown, 5 images]
[im 33/161  soft-tissue]
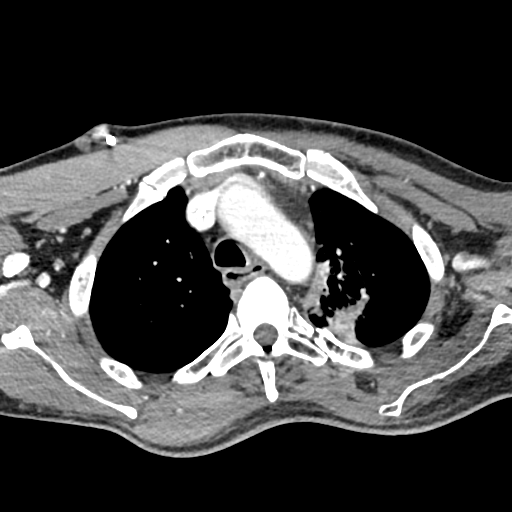
[im 33/161  bone]
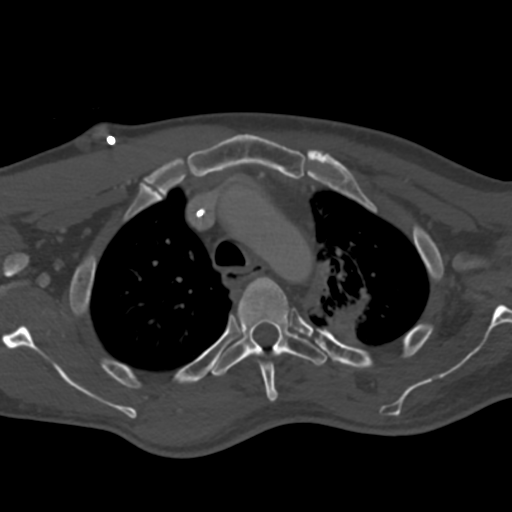
[im 65/161  bone]
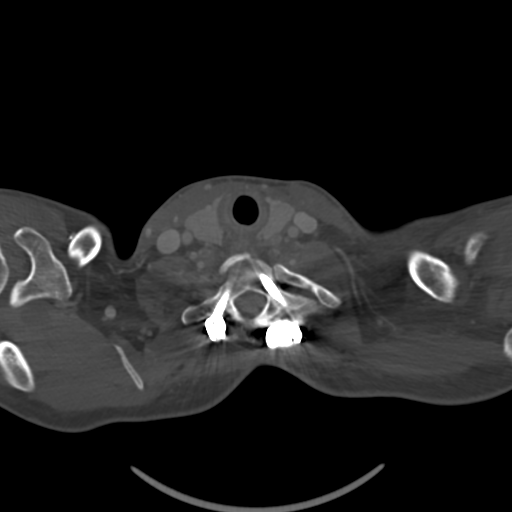
[im 97/161  bone]
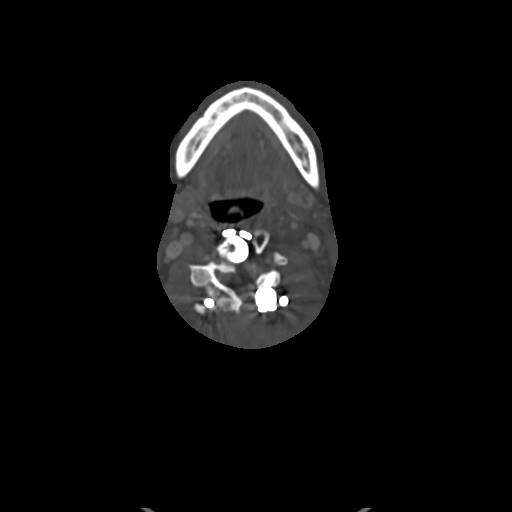
[im 129/161  bone]
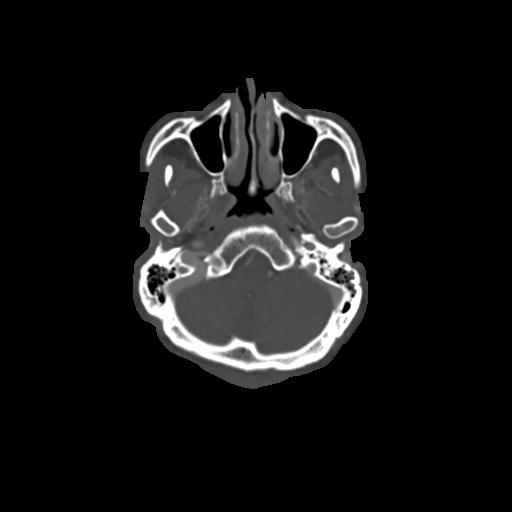

[16 of 33 positions shown; findings below may reference images not displayed]

FINDINGS: PHARYNX AND LARYNX: The nasopharynx, oropharynx and larynx are
normal. Visible portions of the oral cavity, tongue base and floor
of mouth are normal. Normal epiglottis, vallecula and pyriform
sinuses. The larynx is normal. No retropharyngeal abscess, effusion
or lymphadenopathy.

SALIVARY GLANDS: Normal parotid, submandibular and sublingual
glands.

THYROID: Normal.

LYMPH NODES: No enlarged or abnormal density lymph nodes.

VASCULAR: Major cervical vessels are patent.

LIMITED INTRACRANIAL: Normal.

VISUALIZED ORBITS: Normal.

MASTOIDS AND VISUALIZED PARANASAL SINUSES: No fluid levels or
advanced mucosal thickening. No mastoid effusion.

SKELETON: Status post C4-6 corpectomy and C3-7 anterior fusion.
There is posterior fusion from C2-T2. Previously demonstrated left
paraspinal tumor has greatly regressed and there is no residual
tumor visible, allowing for significant streak artifact from the
adjacent spinal hardware.

UPPER CHEST: Please refer to report for concomitant CT of the chest

OTHER: None.
IMPRESSION: 1. Resolution of previously demonstrated left paraspinal tumor,
allowing for limited visualization due to streak artifact from
spinal hardware.
2. No cervical lymphadenopathy.

## 2021-12-03 MED ORDER — IOHEXOL 300 MG/ML  SOLN
100.0000 mL | Freq: Once | INTRAMUSCULAR | Status: AC | PRN
  Administered 2021-12-03: 100 mL via INTRAVENOUS

## 2021-12-06 ENCOUNTER — Encounter: Payer: Self-pay | Admitting: Oncology

## 2021-12-06 ENCOUNTER — Inpatient Hospital Stay: Payer: 59 | Attending: Oncology

## 2021-12-06 ENCOUNTER — Other Ambulatory Visit: Payer: Self-pay

## 2021-12-06 ENCOUNTER — Inpatient Hospital Stay (HOSPITAL_BASED_OUTPATIENT_CLINIC_OR_DEPARTMENT_OTHER): Payer: 59 | Admitting: Oncology

## 2021-12-06 ENCOUNTER — Inpatient Hospital Stay: Payer: 59

## 2021-12-06 VITALS — BP 125/86 | HR 99 | Temp 97.8°F | Resp 20 | Wt 149.6 lb

## 2021-12-06 VITALS — BP 121/91 | HR 71 | Resp 16

## 2021-12-06 DIAGNOSIS — C3412 Malignant neoplasm of upper lobe, left bronchus or lung: Secondary | ICD-10-CM | POA: Diagnosis present

## 2021-12-06 DIAGNOSIS — Z95828 Presence of other vascular implants and grafts: Secondary | ICD-10-CM

## 2021-12-06 DIAGNOSIS — Z79899 Other long term (current) drug therapy: Secondary | ICD-10-CM | POA: Insufficient documentation

## 2021-12-06 DIAGNOSIS — Z5189 Encounter for other specified aftercare: Secondary | ICD-10-CM | POA: Diagnosis not present

## 2021-12-06 DIAGNOSIS — Z87891 Personal history of nicotine dependence: Secondary | ICD-10-CM | POA: Insufficient documentation

## 2021-12-06 DIAGNOSIS — T451X5A Adverse effect of antineoplastic and immunosuppressive drugs, initial encounter: Secondary | ICD-10-CM

## 2021-12-06 DIAGNOSIS — D701 Agranulocytosis secondary to cancer chemotherapy: Secondary | ICD-10-CM | POA: Diagnosis not present

## 2021-12-06 DIAGNOSIS — C782 Secondary malignant neoplasm of pleura: Secondary | ICD-10-CM | POA: Insufficient documentation

## 2021-12-06 DIAGNOSIS — Z5112 Encounter for antineoplastic immunotherapy: Secondary | ICD-10-CM | POA: Diagnosis present

## 2021-12-06 DIAGNOSIS — C7951 Secondary malignant neoplasm of bone: Secondary | ICD-10-CM | POA: Insufficient documentation

## 2021-12-06 LAB — CBC WITH DIFFERENTIAL/PLATELET
Abs Immature Granulocytes: 0.02 10*3/uL (ref 0.00–0.07)
Basophils Absolute: 0.1 10*3/uL (ref 0.0–0.1)
Basophils Relative: 2 %
Eosinophils Absolute: 0.3 10*3/uL (ref 0.0–0.5)
Eosinophils Relative: 8 %
HCT: 36.1 % — ABNORMAL LOW (ref 39.0–52.0)
Hemoglobin: 11.5 g/dL — ABNORMAL LOW (ref 13.0–17.0)
Immature Granulocytes: 1 %
Lymphocytes Relative: 36 %
Lymphs Abs: 1.5 10*3/uL (ref 0.7–4.0)
MCH: 27.6 pg (ref 26.0–34.0)
MCHC: 31.9 g/dL (ref 30.0–36.0)
MCV: 86.6 fL (ref 80.0–100.0)
Monocytes Absolute: 0.7 10*3/uL (ref 0.1–1.0)
Monocytes Relative: 17 %
Neutro Abs: 1.4 10*3/uL — ABNORMAL LOW (ref 1.7–7.7)
Neutrophils Relative %: 36 %
Platelets: 507 10*3/uL — ABNORMAL HIGH (ref 150–400)
RBC: 4.17 MIL/uL — ABNORMAL LOW (ref 4.22–5.81)
RDW: 17.7 % — ABNORMAL HIGH (ref 11.5–15.5)
WBC: 3.9 10*3/uL — ABNORMAL LOW (ref 4.0–10.5)
nRBC: 0 % (ref 0.0–0.2)

## 2021-12-06 LAB — COMPREHENSIVE METABOLIC PANEL
ALT: 13 U/L (ref 0–44)
AST: 16 U/L (ref 15–41)
Albumin: 3.6 g/dL (ref 3.5–5.0)
Alkaline Phosphatase: 88 U/L (ref 38–126)
Anion gap: 9 (ref 5–15)
BUN: 13 mg/dL (ref 6–20)
CO2: 26 mmol/L (ref 22–32)
Calcium: 9.1 mg/dL (ref 8.9–10.3)
Chloride: 100 mmol/L (ref 98–111)
Creatinine, Ser: 1.15 mg/dL (ref 0.61–1.24)
GFR, Estimated: >60 mL/min (ref >60)
Glucose, Bld: 82 mg/dL (ref 70–99)
Potassium: 3.5 mmol/L (ref 3.5–5.1)
Sodium: 135 mmol/L (ref 135–145)
Total Bilirubin: 0.6 mg/dL (ref 0.3–1.2)
Total Protein: 7.5 g/dL (ref 6.5–8.1)

## 2021-12-06 MED ORDER — SODIUM CHLORIDE 0.9% FLUSH
10.0000 mL | INTRAVENOUS | Status: DC | PRN
  Administered 2021-12-06: 10 mL
  Filled 2021-12-06: qty 10

## 2021-12-06 MED ORDER — DIPHENHYDRAMINE HCL 25 MG PO CAPS
50.0000 mg | ORAL_CAPSULE | Freq: Once | ORAL | Status: AC
  Administered 2021-12-06: 50 mg via ORAL
  Filled 2021-12-06: qty 2

## 2021-12-06 MED ORDER — HEPARIN SOD (PORK) LOCK FLUSH 100 UNIT/ML IV SOLN
500.0000 [IU] | Freq: Once | INTRAVENOUS | Status: AC | PRN
  Administered 2021-12-06: 500 [IU]
  Filled 2021-12-06: qty 5

## 2021-12-06 MED ORDER — DEXTROSE 5 % IV SOLN
Freq: Once | INTRAVENOUS | Status: AC
  Filled 2021-12-06: qty 250

## 2021-12-06 MED ORDER — FAM-TRASTUZUMAB DERUXTECAN-NXKI CHEMO 100 MG IV SOLR
5.4000 mg/kg | Freq: Once | INTRAVENOUS | Status: AC
  Administered 2021-12-06: 360 mg via INTRAVENOUS
  Filled 2021-12-06: qty 18

## 2021-12-06 MED ORDER — SODIUM CHLORIDE 0.9% FLUSH
10.0000 mL | Freq: Once | INTRAVENOUS | Status: AC
  Administered 2021-12-06: 10 mL via INTRAVENOUS
  Filled 2021-12-06: qty 10

## 2021-12-06 MED ORDER — ACETAMINOPHEN 325 MG PO TABS
650.0000 mg | ORAL_TABLET | Freq: Once | ORAL | Status: AC
  Administered 2021-12-06: 650 mg via ORAL
  Filled 2021-12-06: qty 2

## 2021-12-06 MED ORDER — PALONOSETRON HCL INJECTION 0.25 MG/5ML
0.2500 mg | Freq: Once | INTRAVENOUS | Status: AC
  Administered 2021-12-06: 0.25 mg via INTRAVENOUS
  Filled 2021-12-06: qty 5

## 2021-12-06 MED ORDER — HEPARIN SOD (PORK) LOCK FLUSH 100 UNIT/ML IV SOLN
INTRAVENOUS | Status: AC
  Filled 2021-12-06: qty 5

## 2021-12-06 MED ORDER — SODIUM CHLORIDE 0.9 % IV SOLN
10.0000 mg | Freq: Once | INTRAVENOUS | Status: AC
  Administered 2021-12-06: 10 mg via INTRAVENOUS
  Filled 2021-12-06: qty 10

## 2021-12-06 MED ORDER — ALTEPLASE 2 MG IJ SOLR
2.0000 mg | Freq: Once | INTRAMUSCULAR | Status: AC | PRN
  Administered 2021-12-06: 2 mg
  Filled 2021-12-06: qty 2

## 2021-12-06 NOTE — Patient Instructions (Signed)
[facility] AT [place]  Discharge Instructions: Thank you for choosing [facility] to provide your oncology and hematology care.  If you have a lab appointment with the [facility], please go directly to the [facility] and check in at the registration area.  Wear comfortable clothing and clothing appropriate for easy access to any Portacath or PICC line.   We strive to give you quality time with your provider. You may need to reschedule your appointment if you arrive late (15 or more minutes).  Arriving late affects you and other patients whose appointments are after yours.  Also, if you miss three or more appointments without notifying the office, you may be dismissed from the clinic at the provider's discretion.      For prescription refill requests, have your pharmacy contact our office and allow 72 hours for refills to be completed.    Today you received the following chemotherapy and/or immunotherapy agents Enhertu      To help prevent nausea and vomiting after your treatment, we encourage you to take your nausea medication as directed.  BELOW ARE SYMPTOMS THAT SHOULD BE REPORTED IMMEDIATELY: *FEVER GREATER THAN 100.4 F (38 C) OR HIGHER *CHILLS OR SWEATING *NAUSEA AND VOMITING THAT IS NOT CONTROLLED WITH YOUR NAUSEA MEDICATION *UNUSUAL SHORTNESS OF BREATH *UNUSUAL BRUISING OR BLEEDING *URINARY PROBLEMS (pain or burning when urinating, or frequent urination) *BOWEL PROBLEMS (unusual diarrhea, constipation, pain near the anus) TENDERNESS IN MOUTH AND THROAT WITH OR WITHOUT PRESENCE OF ULCERS (sore throat, sores in mouth, or a toothache) UNUSUAL RASH, SWELLING OR PAIN  UNUSUAL VAGINAL DISCHARGE OR ITCHING   Items with * indicate a potential emergency and should be followed up as soon as possible or go to the Emergency Department if any problems should occur.  Please show the CHEMOTHERAPY ALERT CARD or IMMUNOTHERAPY ALERT CARD at check-in to the  Emergency Department and triage nurse.  Should you have questions after your visit or need to cancel or reschedule your appointment, please contact [facility] [place] AT [place]  [phone] and follow the prompts.  Office hours are 8:00 a.m. to 4:30 p.m. Monday - Friday. Please note that voicemails left after 4:00 p.m. may not be returned until the following business day.  We are closed weekends and major holidays. You have access to a nurse at all times for urgent questions. Please call the main number to the clinic [phone] and follow the prompts.  For any non-urgent questions, you may also contact your provider using MyChart. We now offer e-Visits for anyone 74 and older to request care online for non-urgent symptoms. For details visit [email or web address].   Also download the MyChart app! Go to the app store, search "MyChart", open the app, select [facility], and log in with your MyChart username and password.  Due to Covid, a mask is required upon entering the hospital/clinic. If you do not have a mask, one will be given to you upon arrival. For doctor visits, patients may have 1 support person aged 23 or older with them. For treatment visits, patients cannot have anyone with them due to current Covid guidelines and our immunocompromised population.

## 2021-12-06 NOTE — Progress Notes (Signed)
ANC 1.4. Per Faythe Casa, NP, proceed with treatment.  Flushed port upon arrival to infusion suite for treatment. No blood return noted but blood return was present with port access. Port flushes with no resistance and no edema/pain noted at this time. Last port verification was 11/2019. Rulon Abide, NP made aware. Per Sonia Baller, NP, proceed with Cathflo.   1117: Attempted to check for blood return after 30 minutes of Cathflo, no blood return noted. IV started to start treatment. Rulon Abide, NP made aware.  1247: 2 Hours post Cathflo. Brisk blood return noted. Aspirated 5 MLs of blood per protocol. Port flushed and deaccessed.

## 2021-12-06 NOTE — Progress Notes (Signed)
Hematology/Oncology Consult note [facility]  [phone] [phone]  Patient Care Team: Default, Provider, MD as PCP - General Telford Nab, RN as Registered Nurse Sindy Guadeloupe, MD as Consulting Physician (Hematology and Oncology) Sindy Guadeloupe, MD as Consulting Physician (Hematology and Oncology)   Name of the patient: Kyle Guerrero  [id number]  08/28/1971   Date of visit: 12/06/21  Diagnosis-  Non-small cell lung cancer stage IV acT2 cN2 cM1 a with pleural involvement as well as large cervical neck mass  Chief complaint/ Reason for visit-on treatment assessment prior to cycle 3 of Enhertu  Heme/Onc history: patient is a 50 year old male who presented to the ER with symptoms of heaviness in his chest and upper left chest discomfort he underwent CT angio chest to rule out PE which showed 3.8 x 3.3 cm left upper palpable lung mass along with 4.4 x 3.3 cm lobulated mass in the aortopulmonary window and 2.6 x 2.4 cm left hilar mass all concerning for malignancy.  Patient has also seen pulmonary and has been set up for bronchoscopy and EBUS guided biopsy on 11/23/2019.  Patient underwent.  PET CT scan which showed a hypermetabolic spiculated 3.5 cm of 5 left upper lobe lung mass, adjacent hypermetabolic 3.2 x 1.2 cm pleural metastases in the medial posterior by the left pleural space along with scalloping of the adjacent posterior left third rib.  Hypermetabolic infiltrative left perihilar conglomerate nodal metastases measuring up to 7.3 x 3.6 cm and 0.8 cm high left mediastinal node between the left brachiocephalic vein and left subclavian artery.  No evidence of distant metastatic disease   Biopsy showed non-small cell lung cancer but further characterization could not be determined.  Insufficient tissue for NGS testing. Repeat biopsy done. Results of NGS testing showed PD-L1 50%.  Tumor mutational burden high.  ERBB2 copy number again. 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    Patient completed concurrent chemoradiation with carbotaxol chemotherapy followed by 2 cycles of carbotaxol Keytruda and was on maintenance Keytruda   Patient was complaining of left shoulder pain and underwent MRI of the shoulder which showed tear in the supraspinatus tendon.  He was subsequently seen by [facility] and underwent MRI of the cervical spine which showed a 4.6 x 4.8 x 8.2 cm mass in the left prevertebral region from C2-C7 levels.  The mass partially encases the left vertebral artery and abuts the preforaminal segment.  Invades the vertebral bodies and edema of the left C5 articular pillar.  There is slight extension into the left C4-C5 thecal sac without central thecal sac compression.  The mass displaces the left pharynx anteriorly along the left carotid sheath.  Patient had a repeat biopsy which was consistent with adenocarcinoma.   Patient underwent palliative radiation treatment to his neck mass and completed 4 cycles of carboplatin and [name] with excellent response to treatment and is currently on maintenance [name].  Repeat NGS testing was also performed which did not show any evidence of actionable mutations other than ERBB2 gain   Patient had recurrence of his neck pain and repeat imaging showedInterim increase in the size of his neck mass and patient went for second opinion for both medical oncology as well as neurosurgery continue current was decided to cooperate upon this mass for definitive resection.  Patient underwent resection of cervical tumor which showed poorly differentiated metastatic adenocarcinoma on 08/29/2021.  TTF-1 negative.  Interval history-reports tolerating new treatment well.  Denies any  significant left upper extremity pain but no improvement with range of motion or muscle.  He has a nerve study at [facility] next week.  Currently not seeing PT until after nerve study.  ECOG PS- 1 Pain scale- 0 Opioid  associated constipation- no  Review of systems- Review of Systems  Musculoskeletal:  Positive for myalgias.      No Known Allergies   Past Medical History:  Diagnosis Date   Asthma    Lung cancer ([place])      Past Surgical History:  Procedure Laterality Date   CYST EXCISION     IR IMAGING GUIDED PORT INSERTION  12/05/2019   VIDEO BRONCHOSCOPY WITH ENDOBRONCHIAL ULTRASOUND Left 11/22/2019   Procedure: VIDEO BRONCHOSCOPY WITH ENDOBRONCHIAL ULTRASOUND;  Surgeon: Tyler Pita, MD;  Location: [facility] ORS;  Service: Thoracic;  Laterality: Left;    Social History   Socioeconomic History   Marital status: Single    Spouse name: Not on file   Number of children: Not on file   Years of education: Not on file   Highest education level: Not on file  Occupational History   Not on file  Tobacco Use   Smoking status: Former    Packs/day: 2.00    Types: Cigarettes    Quit date: 11/08/2019    Years since quitting: 2.0   Smokeless tobacco: Never  Vaping Use   Vaping Use: Never used  Substance and Sexual Activity   Alcohol use: Not Currently   Drug use: Yes    Types: Marijuana   Sexual activity: Not on file  Other Topics Concern   Not on file  Social History Narrative   Not on file   Social Determinants of Health   Financial Resource Strain: Not on file  Food Insecurity: Not on file  Transportation Needs: Not on file  Physical Activity: Not on file  Stress: Not on file  Social Connections: Not on file  Intimate Partner Violence: Not on file    Family History  Problem Relation Age of Onset   Healthy Mother    Healthy Father      Current Outpatient Medications:    baclofen (LIORESAL) 10 MG tablet, TAKE 1 TABLET BY MOUTH THREE TIMES A DAY, Disp: 90 tablet, Rfl: 1   docusate sodium (COLACE) 100 MG capsule, Take 100 mg by mouth daily., Disp: , Rfl:    metoprolol tartrate (LOPRESSOR) 25 MG tablet, TAKE 1/2 TABLETS BY MOUTH TWICE A DAY, Disp: 90 tablet, Rfl: 1    oxyCODONE (OXY IR/ROXICODONE) 5 MG immediate release tablet, Take 1 tablet (5 mg total) by mouth every 4 (four) hours as needed for moderate pain or severe pain., Disp: 180 tablet, Rfl: 0   pregabalin (LYRICA) 75 MG capsule, Take 1 capsule (75 mg total) by mouth 2 (two) times daily., Disp: 60 capsule, Rfl: 2   sucralfate (CARAFATE) 1 g tablet, Take by mouth., Disp: , Rfl:    prochlorperazine (COMPAZINE) 10 MG tablet, Take 1 tablet (10 mg total) by mouth every 6 (six) hours as needed (Nausea or vomiting). (Patient not taking: Reported on 11/04/2021), Disp: 30 tablet, Rfl: 1 No current facility-administered medications for this visit.  Facility-Administered Medications Ordered in Other Visits:    acetaminophen (TYLENOL) tablet 650 mg, 650 mg, Oral, Once, Sindy Guadeloupe, MD   dexamethasone (DECADRON) 10 mg in sodium chloride 0.9 % 50 mL IVPB, 10 mg, Intravenous, Once, Sindy Guadeloupe, MD   dextrose 5 % solution, , Intravenous, Once, Sindy Guadeloupe,  MD   diphenhydrAMINE (BENADRYL) capsule 50 mg, 50 mg, Oral, Once, Sindy Guadeloupe, MD   fam-trastuzumab deruxtecan-nxki (ENHERTU) 360 mg in dextrose 5 % 100 mL chemo infusion, 5.4 mg/kg (Treatment Plan Recorded), Intravenous, Once, Sindy Guadeloupe, MD   heparin lock flush 100 unit/mL, 500 Units, Intravenous, Once, Sindy Guadeloupe, MD   heparin lock flush 100 unit/mL, 500 Units, Intravenous, Once, Sindy Guadeloupe, MD   heparin lock flush 100 unit/mL, 500 Units, Intracatheter, Once PRN, Sindy Guadeloupe, MD   palonosetron (ALOXI) injection 0.25 mg, 0.25 mg, Intravenous, Once, Sindy Guadeloupe, MD   sodium chloride flush (NS) 0.9 % injection 10 mL, 10 mL, Intravenous, PRN, Sindy Guadeloupe, MD, 10 mL at 05/01/20 0900   sodium chloride flush (NS) 0.9 % injection 10 mL, 10 mL, Intravenous, PRN, Sindy Guadeloupe, MD, 10 mL at 01/29/21 0835   sodium chloride flush (NS) 0.9 % injection 10 mL, 10 mL, Intracatheter, PRN, Sindy Guadeloupe, MD  Physical exam:  Vitals:   12/06/21  0923  BP: 125/86  Pulse: 99  Resp: 20  Temp: 97.8 F (36.6 C)  SpO2: 100%  Weight: 149 lb 9.6 oz (67.9 kg)   Physical Exam Constitutional:      Appearance: Normal appearance.  Cardiovascular:     Rate and Rhythm: Normal rate and regular rhythm.  Musculoskeletal:     Left shoulder: Decreased range of motion.     Comments: Left arm in sling. Can move fingers.   Neurological:     Mental Status: He is alert and oriented to person, place, and time.     CMP Latest Ref Rng & Units 12/06/2021  Glucose 70 - 99 mg/dL 82  BUN 6 - 20 mg/dL 13  Creatinine 0.61 - 1.24 mg/dL 1.15  Sodium 135 - 145 mmol/L 135  Potassium 3.5 - 5.1 mmol/L 3.5  Chloride 98 - 111 mmol/L 100  CO2 22 - 32 mmol/L 26  Calcium 8.9 - 10.3 mg/dL 9.1  Total Protein 6.5 - 8.1 g/dL 7.5  Total Bilirubin 0.3 - 1.2 mg/dL 0.6  Alkaline Phos 38 - 126 U/L 88  AST 15 - 41 U/L 16  ALT 0 - 44 U/L 13   CBC Latest Ref Rng & Units 12/06/2021  WBC 4.0 - 10.5 K/uL 3.9(L)  Hemoglobin 13.0 - 17.0 g/dL 11.5(L)  Hematocrit 39.0 - 52.0 % 36.1(L)  Platelets 150 - 400 K/uL 507(H)    No images are attached to the encounter.  CT SOFT TISSUE NECK W CONTRAST  Result Date: 12/03/2021 CLINICAL DATA:  Restaging of non-small cell lung carcinoma EXAM: CT NECK WITH CONTRAST TECHNIQUE: Multidetector CT imaging of the neck was performed using the standard protocol following the bolus administration of intravenous contrast. CONTRAST:  114m OMNIPAQUE IOHEXOL 300 MG/ML  SOLN COMPARISON:  10/04/2021 FINDINGS: PHARYNX AND LARYNX: The nasopharynx, oropharynx and larynx are normal. Visible portions of the oral cavity, tongue base and floor of mouth are normal. Normal epiglottis, vallecula and pyriform sinuses. The larynx is normal. No retropharyngeal abscess, effusion or lymphadenopathy. SALIVARY GLANDS: Normal parotid, submandibular and sublingual glands. THYROID: Normal. LYMPH NODES: No enlarged or abnormal density lymph nodes. VASCULAR: Major cervical  vessels are patent. LIMITED INTRACRANIAL: Normal. VISUALIZED ORBITS: Normal. MASTOIDS AND VISUALIZED PARANASAL SINUSES: No fluid levels or advanced mucosal thickening. No mastoid effusion. SKELETON: Status post C4-6 corpectomy and C3-7 anterior fusion. There is posterior fusion from C2-T2. Previously demonstrated left paraspinal tumor has greatly regressed and there is no  residual tumor visible, allowing for significant streak artifact from the adjacent spinal hardware. UPPER CHEST: Please refer to report for concomitant CT of the chest OTHER: None. IMPRESSION: 1. Resolution of previously demonstrated left paraspinal tumor, allowing for limited visualization due to streak artifact from spinal hardware. 2. No cervical lymphadenopathy. Electronically Signed   By: Ulyses Jarred M.D.   On: 12/03/2021 16:45   CT CHEST ABDOMEN PELVIS W CONTRAST  Result Date: 12/04/2021 CLINICAL DATA:  History of left upper lobe lung cancer for restaging. Non-small cell lung cancer left upper lobe diagnosed December 2020 post chemotherapy and radiation treatment. Patient on Keytruda maintenance injections monthly. EXAM: CT CHEST, ABDOMEN, AND PELVIS WITH CONTRAST TECHNIQUE: Multidetector CT imaging of the chest, abdomen and pelvis was performed following the standard protocol during bolus administration of intravenous contrast. CONTRAST:  125m OMNIPAQUE IOHEXOL 300 MG/ML  SOLN COMPARISON:  10/04/2021, 05/05/2021, PET-CT 07/19/2021 FINDINGS: CT CHEST FINDINGS Cardiovascular: Right upper chest wall Port-A-Cath with tip at the cavoatrial junction. Heart is normal size. Thoracic aorta is normal in caliber. Remaining vascular structures are unremarkable. Mediastinum/Nodes: No mediastinal or hilar adenopathy. Remaining mediastinal structures are unremarkable. Lungs/Pleura: Mild stable volume loss left lung with subtle elevation of the left hemidiaphragm. Stable 1.4 cm oval nodule over the left apex. Adjacent right paramediastinal/apical  consolidation unchanged likely due to post radiation/post treatment changes. Lung is clear. Airways unremarkable. Musculoskeletal: Anterior and posterior fusion hardware over the cervicothoracic spine causing moderate streak artifact. Stable fractures of the left posterior third and fourth ribs likely related to radiation treatment. CT ABDOMEN PELVIS FINDINGS Hepatobiliary: Liver, gallbladder and biliary tree are normal. Pancreas: Normal. Spleen: Normal. Adrenals/Urinary Tract: Adrenal glands are normal. Kidneys are normal in size without hydronephrosis or nephrolithiasis. Patchy areas of low attenuation over the renal cortex on the delayed phase images unchanged from May 2022. Bladder and ureters are otherwise unremarkable. Stomach/Bowel: Mild gastric distension. Subtle wall thickening involving the third portion of the duodenum without significant change. Small bowel is otherwise unremarkable. Mild fecal retention throughout the colon which is otherwise unremarkable. Appendix not visualized. Vascular/Lymphatic: Minimal calcified plaque over the abdominal aorta which is normal in caliber. Remaining vascular structures are unremarkable. No adenopathy. Reproductive: Normal. Other: No free fluid. Musculoskeletal: No focal abnormality. IMPRESSION: 1. No evidence of metastatic disease within the chest, abdomen or pelvis. 2. Stable 1.4 cm oval nodule over the left apex. Stable adjacent right paramediastinal/apical consolidation likely due to post radiation/post treatment changes. Stable fractures of the left posterior third and fourth ribs likely related to prior radiation treatment. 3. Patchy areas of low attenuation over the renal cortex unchanged from May 2022. 4. Aortic atherosclerosis. Aortic Atherosclerosis (ICD10-I70.0). Electronically Signed   By: DMarin OlpM.D.   On: 12/04/2021 08:22     Assessment and plan- Patient is a 50y.o. male with recurrent metastatic adenocarcinoma of the lung stage IV presently  with a large tumor involving the cervical spine s/p surgery and adjuvant radiation treatment with residual tumor.  He is here for treatment assessment prior to cycle 3 of Enhertu.   Reviewed CT scan with patient which showed resolution of previously demonstrated left paraspinal tumor without cervical lymphadenopathy.  No evidence of metastatic disease within the chest abdomen or pelvis and a stable 1.4 cm oval nodule over the left apex.  Stable adjacent right paramediastinal apical consolidation likely due to postradiation and treatment changes.  Stable fractures of left posterior third and fourth ribs likely related to prior radiation treatment.  Counts okay to proceed with treatment with Udenyca on day 3.  Return to clinic in 3 weeks and he will be seen by Dr. Janese Banks for follow-up prior to cycle 4.    Left upper extremity weakness- He is scheduled to have nerve conduction study done on 12/10/2021.  He will follow-up with physical therapy after.  He has been evaluated with Dr. Mickeal Skinner and is status post surgery and radiation.  Neutropenia- WBC 3.9 with an ANC of 1.4.  Will receive Udenyca on day 3.  Discussed neutropenic precautions.  Disposition- Treatment today with Udenyca on day 3. Return to clinic as scheduled to see Dr. Janese Banks for cycle 4. Nerve conduction study on 12/10/2021 at [facility].  I spent 20 minutes dedicated to the care of this patient (face-to-face and non-face-to-face) on the date of the encounter to include what is described in the assessment and plan.  Visit Diagnosis 1. Malignant neoplasm of upper lobe of left lung ([place])    Faythe Casa, NP 12/06/2021 10:57 AM

## 2021-12-08 ENCOUNTER — Other Ambulatory Visit: Payer: Self-pay | Admitting: Oncology

## 2021-12-08 ENCOUNTER — Inpatient Hospital Stay: Payer: 59

## 2021-12-08 ENCOUNTER — Other Ambulatory Visit: Payer: Self-pay

## 2021-12-08 DIAGNOSIS — Z5112 Encounter for antineoplastic immunotherapy: Secondary | ICD-10-CM | POA: Diagnosis not present

## 2021-12-08 DIAGNOSIS — C3412 Malignant neoplasm of upper lobe, left bronchus or lung: Secondary | ICD-10-CM

## 2021-12-08 MED ORDER — PEGFILGRASTIM-CBQV 6 MG/0.6ML ~~LOC~~ SOSY
6.0000 mg | PREFILLED_SYRINGE | Freq: Once | SUBCUTANEOUS | Status: AC
  Administered 2021-12-08: 15:00:00 6 mg via SUBCUTANEOUS
  Filled 2021-12-08: qty 0.6

## 2021-12-09 ENCOUNTER — Encounter: Payer: Self-pay | Admitting: Oncology

## 2021-12-23 ENCOUNTER — Encounter: Payer: Self-pay | Admitting: Oncology

## 2021-12-23 ENCOUNTER — Telehealth: Payer: Self-pay | Admitting: Oncology

## 2021-12-23 NOTE — Telephone Encounter (Signed)
Spoke with patient about the need to reschedule his appointments (per Darlena Clarke--we will need additional time for auth as patient switches from [facility] to [facility]). He is agreeable to new day and time.

## 2021-12-28 ENCOUNTER — Inpatient Hospital Stay: Payer: 59

## 2021-12-28 ENCOUNTER — Inpatient Hospital Stay: Payer: 59 | Admitting: Oncology

## 2021-12-30 ENCOUNTER — Inpatient Hospital Stay: Payer: 59

## 2022-01-05 ENCOUNTER — Other Ambulatory Visit: Payer: Self-pay

## 2022-01-05 ENCOUNTER — Inpatient Hospital Stay: Payer: 59

## 2022-01-05 ENCOUNTER — Inpatient Hospital Stay (HOSPITAL_BASED_OUTPATIENT_CLINIC_OR_DEPARTMENT_OTHER): Payer: 59 | Admitting: Oncology

## 2022-01-05 ENCOUNTER — Encounter: Payer: Self-pay | Admitting: Oncology

## 2022-01-05 ENCOUNTER — Inpatient Hospital Stay: Payer: 59 | Attending: Oncology

## 2022-01-05 VITALS — BP 122/87 | HR 82 | Temp 95.9°F | Resp 20 | Wt 150.0 lb

## 2022-01-05 DIAGNOSIS — Z87891 Personal history of nicotine dependence: Secondary | ICD-10-CM | POA: Diagnosis not present

## 2022-01-05 DIAGNOSIS — C3412 Malignant neoplasm of upper lobe, left bronchus or lung: Secondary | ICD-10-CM | POA: Diagnosis not present

## 2022-01-05 DIAGNOSIS — Z923 Personal history of irradiation: Secondary | ICD-10-CM | POA: Insufficient documentation

## 2022-01-05 DIAGNOSIS — Z5112 Encounter for antineoplastic immunotherapy: Secondary | ICD-10-CM | POA: Insufficient documentation

## 2022-01-05 DIAGNOSIS — C7989 Secondary malignant neoplasm of other specified sites: Secondary | ICD-10-CM | POA: Diagnosis not present

## 2022-01-05 DIAGNOSIS — C782 Secondary malignant neoplasm of pleura: Secondary | ICD-10-CM | POA: Diagnosis not present

## 2022-01-05 DIAGNOSIS — Z79899 Other long term (current) drug therapy: Secondary | ICD-10-CM | POA: Insufficient documentation

## 2022-01-05 DIAGNOSIS — C7951 Secondary malignant neoplasm of bone: Secondary | ICD-10-CM | POA: Insufficient documentation

## 2022-01-05 LAB — CBC WITH DIFFERENTIAL/PLATELET
Abs Immature Granulocytes: 0.03 10*3/uL (ref 0.00–0.07)
Basophils Absolute: 0.1 10*3/uL (ref 0.0–0.1)
Basophils Relative: 1 %
Eosinophils Absolute: 0.3 10*3/uL (ref 0.0–0.5)
Eosinophils Relative: 7 %
HCT: 37.3 % — ABNORMAL LOW (ref 39.0–52.0)
Hemoglobin: 12.1 g/dL — ABNORMAL LOW (ref 13.0–17.0)
Immature Granulocytes: 1 %
Lymphocytes Relative: 30 %
Lymphs Abs: 1.5 10*3/uL (ref 0.7–4.0)
MCH: 28.9 pg (ref 26.0–34.0)
MCHC: 32.4 g/dL (ref 30.0–36.0)
MCV: 89 fL (ref 80.0–100.0)
Monocytes Absolute: 0.8 10*3/uL (ref 0.1–1.0)
Monocytes Relative: 15 %
Neutro Abs: 2.5 10*3/uL (ref 1.7–7.7)
Neutrophils Relative %: 46 %
Platelets: 483 10*3/uL — ABNORMAL HIGH (ref 150–400)
RBC: 4.19 MIL/uL — ABNORMAL LOW (ref 4.22–5.81)
RDW: 18.6 % — ABNORMAL HIGH (ref 11.5–15.5)
WBC: 5.2 10*3/uL (ref 4.0–10.5)
nRBC: 0 % (ref 0.0–0.2)

## 2022-01-05 LAB — COMPREHENSIVE METABOLIC PANEL
ALT: 10 U/L (ref 0–44)
AST: 15 U/L (ref 15–41)
Albumin: 3.9 g/dL (ref 3.5–5.0)
Alkaline Phosphatase: 81 U/L (ref 38–126)
Anion gap: 5 (ref 5–15)
BUN: 16 mg/dL (ref 6–20)
CO2: 26 mmol/L (ref 22–32)
Calcium: 8.9 mg/dL (ref 8.9–10.3)
Chloride: 103 mmol/L (ref 98–111)
Creatinine, Ser: 1.15 mg/dL (ref 0.61–1.24)
GFR, Estimated: >60 mL/min (ref >60)
Glucose, Bld: 85 mg/dL (ref 70–99)
Potassium: 3.5 mmol/L (ref 3.5–5.1)
Sodium: 134 mmol/L — ABNORMAL LOW (ref 135–145)
Total Bilirubin: 0.6 mg/dL (ref 0.3–1.2)
Total Protein: 7.3 g/dL (ref 6.5–8.1)

## 2022-01-05 MED ORDER — DIPHENHYDRAMINE HCL 25 MG PO CAPS
50.0000 mg | ORAL_CAPSULE | Freq: Once | ORAL | Status: AC
  Administered 2022-01-05: 50 mg via ORAL
  Filled 2022-01-05: qty 2

## 2022-01-05 MED ORDER — HEPARIN SOD (PORK) LOCK FLUSH 100 UNIT/ML IV SOLN
500.0000 [IU] | Freq: Once | INTRAVENOUS | Status: AC | PRN
  Administered 2022-01-05: 500 [IU]
  Filled 2022-01-05: qty 5

## 2022-01-05 MED ORDER — DEXTROSE 5 % IV SOLN
Freq: Once | INTRAVENOUS | Status: AC
  Filled 2022-01-05: qty 250

## 2022-01-05 MED ORDER — HEPARIN SOD (PORK) LOCK FLUSH 100 UNIT/ML IV SOLN
500.0000 [IU] | Freq: Once | INTRAVENOUS | Status: DC
  Filled 2022-01-05: qty 5

## 2022-01-05 MED ORDER — PALONOSETRON HCL INJECTION 0.25 MG/5ML
0.2500 mg | Freq: Once | INTRAVENOUS | Status: AC
  Administered 2022-01-05: 0.25 mg via INTRAVENOUS
  Filled 2022-01-05: qty 5

## 2022-01-05 MED ORDER — ACETAMINOPHEN 325 MG PO TABS
650.0000 mg | ORAL_TABLET | Freq: Once | ORAL | Status: AC
  Administered 2022-01-05: 650 mg via ORAL
  Filled 2022-01-05: qty 2

## 2022-01-05 MED ORDER — SODIUM CHLORIDE 0.9% FLUSH
10.0000 mL | INTRAVENOUS | Status: DC | PRN
  Administered 2022-01-05: 10 mL via INTRAVENOUS
  Filled 2022-01-05: qty 10

## 2022-01-05 MED ORDER — FAM-TRASTUZUMAB DERUXTECAN-NXKI CHEMO 100 MG IV SOLR
5.4000 mg/kg | Freq: Once | INTRAVENOUS | Status: AC
  Administered 2022-01-05: 360 mg via INTRAVENOUS
  Filled 2022-01-05: qty 18

## 2022-01-05 MED ORDER — SODIUM CHLORIDE 0.9 % IV SOLN
10.0000 mg | Freq: Once | INTRAVENOUS | Status: AC
  Administered 2022-01-05: 10 mg via INTRAVENOUS
  Filled 2022-01-05: qty 10

## 2022-01-05 NOTE — Progress Notes (Signed)
Nutrition Follow-up:  Patient with recurrent metastatic adenocarcinoma of lung.  Large tumor found in cervical spine with mass displacing the left pharynx, s/p surgery.  Patient has completed radiation and receiving chemotherapy.    Spoke with patient during infusion.  Patient says that his appetite is better. He has been trying to eat more to increase his weight.  Has not been drinking ensure as much as cause bloating when not eating much.  Now that he is eating more solid foods thinks ensure will sit better on his stomach.  Says that he normally eats out for breakfast every other day 2 eggs, sausage, grits or hashbrowns and toast.  Also eats sub sandwiches and able to swallow the bread.      Medications: reviewed  Labs: reviewed  Anthropometrics:   Weight 150 lb today  144 lb 12.8 on 11/10 146 lb on 11/10 153 lb on 9/21 174 lb on 7/26   NUTRITION DIAGNOSIS: Inadequate oral intake improving with weight gain   INTERVENTION:  Reviewed ways to add calories and protein in diet Samples of ensure complete given to patient along with coupons.     MONITORING, EVALUATION, GOAL: weight trends, intake   NEXT VISIT: Wednesday, Feb 1 during infusion  Kyle Guerrero B. Zenia Resides, [place], [place] Registered Dietitian [phone] (mobile)

## 2022-01-05 NOTE — Progress Notes (Signed)
Pt struggling to gain wt. With the work he had got done with surgery he is not able to gain wt. Hard to get anything down, he staes that he has seen Joli about nutrition. He drinks ensure 1-2 month

## 2022-01-05 NOTE — Patient Instructions (Signed)
[facility] AT [place]  Discharge Instructions: Thank you for choosing [facility] to provide your oncology and hematology care.  If you have a lab appointment with the [facility], please go directly to the [facility] and check in at the registration area.  Wear comfortable clothing and clothing appropriate for easy access to any Portacath or PICC line.   We strive to give you quality time with your provider. You may need to reschedule your appointment if you arrive late (15 or more minutes).  Arriving late affects you and other patients whose appointments are after yours.  Also, if you miss three or more appointments without notifying the office, you may be dismissed from the clinic at the providers discretion.      For prescription refill requests, have your pharmacy contact our office and allow 72 hours for refills to be completed.    Today you received the following chemotherapy and/or immunotherapy agents: Enhertu       To help prevent nausea and vomiting after your treatment, we encourage you to take your nausea medication as directed.  BELOW ARE SYMPTOMS THAT SHOULD BE REPORTED IMMEDIATELY: *FEVER GREATER THAN 100.4 F (38 C) OR HIGHER *CHILLS OR SWEATING *NAUSEA AND VOMITING THAT IS NOT CONTROLLED WITH YOUR NAUSEA MEDICATION *UNUSUAL SHORTNESS OF BREATH *UNUSUAL BRUISING OR BLEEDING *URINARY PROBLEMS (pain or burning when urinating, or frequent urination) *BOWEL PROBLEMS (unusual diarrhea, constipation, pain near the anus) TENDERNESS IN MOUTH AND THROAT WITH OR WITHOUT PRESENCE OF ULCERS (sore throat, sores in mouth, or a toothache) UNUSUAL RASH, SWELLING OR PAIN  UNUSUAL VAGINAL DISCHARGE OR ITCHING   Items with * indicate a potential emergency and should be followed up as soon as possible or go to the Emergency Department if any problems should occur.  Please show the CHEMOTHERAPY ALERT CARD or IMMUNOTHERAPY ALERT CARD at check-in to  the Emergency Department and triage nurse.  Should you have questions after your visit or need to cancel or reschedule your appointment, please contact [facility] [place] AT [place]  [phone] and follow the prompts.  Office hours are 8:00 a.m. to 4:30 p.m. Monday - Friday. Please note that voicemails left after 4:00 p.m. may not be returned until the following business day.  We are closed weekends and major holidays. You have access to a nurse at all times for urgent questions. Please call the main number to the clinic [phone] and follow the prompts.  For any non-urgent questions, you may also contact your provider using MyChart. We now offer e-Visits for anyone 31 and older to request care online for non-urgent symptoms. For details visit [email or web address].   Also download the MyChart app! Go to the app store, search "MyChart", open the app, select [facility], and log in with your MyChart username and password.  Due to Covid, a mask is required upon entering the hospital/clinic. If you do not have a mask, one will be given to you upon arrival. For doctor visits, patients may have 1 support person aged 17 or older with them. For treatment visits, patients cannot have anyone with them due to current Covid guidelines and our immunocompromised population. Fam-Trastuzumab deruxtecan injection What is this medication? TRASTUZUMAB DERUXTECAN (tras TOOZ eu mab DER ux TEE kan) is a chemotherapy medicine and a monoclonal antibody. It treats certain types of cancer. Some of the cancers treated are breast cancer and gastric cancer. This medicine may be used for other purposes; ask your health care provider or  pharmacist if you have questions. COMMON BRAND NAME(S): ENHERTU What should I tell my care team before I take this medication? They need to know if you have any of these conditions: heart disease heart failure infection (especially a virus infection such as chickenpox, cold  sores, or herpes) liver disease lung or breathing disease, like asthma an unusual or allergic reaction to fam-trastuzumab deruxtecan, other medications, foods, dyes, or preservatives pregnant or trying to get pregnant breast-feeding How should I use this medication? This medicine is for infusion into a vein. It is given by a health care professional in a hospital or clinic setting. Talk to your pediatrician regarding the use of this medicine in children. Special care may be needed. Overdosage: If you think you have taken too much of this medicine contact a poison control center or emergency room at once. NOTE: This medicine is only for you. Do not share this medicine with others. What if I miss a dose? It is important not to miss your dose. Call your doctor or health care professional if you are unable to keep an appointment. What may interact with this medication? Interaction studies have not been performed. This list may not describe all possible interactions. Give your health care provider a list of all the medicines, herbs, non-prescription drugs, or dietary supplements you use. Also tell them if you smoke, drink alcohol, or use illegal drugs. Some items may interact with your medicine. What should I watch for while using this medication? Visit your healthcare professional for regular checks on your progress. Tell your healthcare professional if your symptoms do not start to get better or if they get worse. Your condition will be monitored carefully while you are receiving this medicine. Do not become pregnant while taking this medicine or for 7 months after stopping it. Women should inform their healthcare professional if they wish to become pregnant or think they might be pregnant. Men should not father a child while taking this medicine and for 4 months after stopping it. There is potential for serious side effects to an unborn child. Talk to your healthcare professional for more  information. Do not breast-feed an infant while taking this medicine or for 7 months after the last dose. This medicine has caused decreased sperm counts in some men. This may make it more difficult to father a child. Talk to your healthcare professional if you are concerned about your fertility. This medicine may increase your risk to bruise or bleed. Call your health care professional if you notice any unusual bleeding. Be careful brushing or flossing your teeth or using a toothpick because you may get an infection or bleed more easily. If you have any dental work done, tell your dentist you are receiving this medicine. This medicine may cause dry eyes [and blurred vision]. If you wear contact lenses, you may feel some discomfort. Lubricating eye drops may help. See your healthcare professional if the problem does not go away or is severe. Call your healthcare professional for advice if you get a fever, chills, or sore throat, or other symptoms of a cold or flu. Do not treat yourself. This medicine decreases your body's ability to fight infections. Try to avoid being around people who are sick. Avoid taking medicines that contain aspirin, acetaminophen, ibuprofen, naproxen, or ketoprofen unless instructed by your healthcare professional. These medicines may hide a fever. What side effects may I notice from receiving this medication? Side effects that you should report to your doctor or health care professional  as soon as possible: allergic reactions like skin rash, itching or hives, swelling of the face, lips, or tongue breathing problems cough nausea, vomiting signs and symptoms of bleeding such as bloody or black, tarry stools; red or dark-brown urine; spitting up blood or brown material that looks like coffee grounds; red spots on the skin; unusual bruising or bleeding from the eye, gums, or nose signs and symptoms of heart failure like breathing problems, fast, irregular heartbeat, sudden weight  gain; swelling of the ankles, feet, hands; unusually weak or tired signs and symptoms of infection like fever; chills; cough; sore throat; pain or trouble passing urine signs and symptoms of low red blood cells or anemia such as unusually weak or tired; feeling faint or lightheaded; falls; breathing problems Side effects that usually do not require medical attention (report these to your doctor or health care professional if they continue or are bothersome): constipation diarrhea dry eyes hair loss loss of appetite mouth sores rash This list may not describe all possible side effects. Call your doctor for medical advice about side effects. You may report side effects to FDA at 1-800-FDA-1088. Where should I keep my medication? This drug is given in a hospital or clinic and will not be stored at home. NOTE: This sheet is a summary. It may not cover all possible information. If you have questions about this medicine, talk to your doctor, pharmacist, or health care provider.  2022 Elsevier/Gold Standard (2021-08-31 00:00:00)

## 2022-01-05 NOTE — Progress Notes (Signed)
Hematology/Oncology Consult note [facility]  Telephone:([phone] [phone]  Patient Care Team: Default, Provider, MD as PCP - General Telford Nab, RN as Registered Nurse Sindy Guadeloupe, MD as Consulting Physician (Hematology and Oncology) Sindy Guadeloupe, MD as Consulting Physician (Hematology and Oncology)   Name of the patient: Kyle Guerrero  [id number]  1971/12/08   Date of visit: 01/05/22  Diagnosis- Non-small cell lung cancer stage IV acT2 cN2 cM1 a with pleural involvement as well as large cervical neck mass  Chief complaint/ Reason for visit-on treatment assessment prior to cycle 4 of Enhertu  Heme/Onc history: patient is a 51 year old male who presented to the ER with symptoms of heaviness in his chest and upper left chest discomfort he underwent CT angio chest to rule out PE which showed 3.8 x 3.3 cm left upper palpable lung mass along with 4.4 x 3.3 cm lobulated mass in the aortopulmonary window and 2.6 x 2.4 cm left hilar mass all concerning for malignancy.  Patient has also seen pulmonary and has been set up for bronchoscopy and EBUS guided biopsy on 11/23/2019.  Patient underwent.  PET CT scan which showed a hypermetabolic spiculated 3.5 cm of 5 left upper lobe lung mass, adjacent hypermetabolic 3.2 x 1.2 cm pleural metastases in the medial posterior by the left pleural space along with scalloping of the adjacent posterior left third rib.  Hypermetabolic infiltrative left perihilar conglomerate nodal metastases measuring up to 7.3 x 3.6 cm and 0.8 cm high left mediastinal node between the left brachiocephalic vein and left subclavian artery.  No evidence of distant metastatic disease   Biopsy showed non-small cell lung cancer but further characterization could not be determined.  Insufficient tissue for NGS testing. Repeat biopsy done. Results of NGS testing showed PD-L1 50%.  Tumor mutational burden high.  ERBB2 copy number again. 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    Patient completed concurrent chemoradiation with carbotaxol chemotherapy followed by 2 cycles of carbotaxol Keytruda and was on maintenance Keytruda   Patient was complaining of left shoulder pain and underwent MRI of the shoulder which showed tear in the supraspinatus tendon.  He was subsequently seen by [facility] and underwent MRI of the cervical spine which showed a 4.6 x 4.8 x 8.2 cm mass in the left prevertebral region from C2-C7 levels.  The mass partially encases the left vertebral artery and abuts the preforaminal segment.  Invades the vertebral bodies and edema of the left C5 articular pillar.  There is slight extension into the left C4-C5 thecal sac without central thecal sac compression.  The mass displaces the left pharynx anteriorly along the left carotid sheath.  Patient had a repeat biopsy which was consistent with adenocarcinoma.   Patient underwent palliative radiation treatment to his neck mass and completed 4 cycles of carboplatin and [name] with excellent response to treatment and is currently on maintenance [name].  Repeat NGS testing was also performed which did not show any evidence of actionable mutations other than ERBB2 gain   Patient had recurrence of his neck pain and repeat imaging showedInterim increase in the size of his neck mass and patient went for second opinion for both medical oncology as well as neurosurgery continue current was decided to cooperate upon this mass for definitive resection.  Patient underwent resection of cervical tumor which showed poorly differentiated metastatic adenocarcinoma on 08/29/2021.  TTF-1 negative.    Interval history-patient has not recovered any neurological function  in his left arm.  He is however able to function with using right arm alone including activities such as driving.  Denies other complaints at this time and is otherwise tolerating Enhertu well without any significant side  effects  ECOG PS- 1 Pain scale- 3 Opioid associated constipation- no  Review of systems- Review of Systems  Constitutional:  Positive for malaise/fatigue.  Neurological:        Left arm weakness     No Known Allergies   Past Medical History:  Diagnosis Date   Asthma    Lung cancer ([place])      Past Surgical History:  Procedure Laterality Date   CYST EXCISION     IR IMAGING GUIDED PORT INSERTION  12/05/2019   VIDEO BRONCHOSCOPY WITH ENDOBRONCHIAL ULTRASOUND Left 11/22/2019   Procedure: VIDEO BRONCHOSCOPY WITH ENDOBRONCHIAL ULTRASOUND;  Surgeon: Tyler Pita, MD;  Location: [facility] ORS;  Service: Thoracic;  Laterality: Left;    Social History   Socioeconomic History   Marital status: Single    Spouse name: Not on file   Number of children: Not on file   Years of education: Not on file   Highest education level: Not on file  Occupational History   Not on file  Tobacco Use   Smoking status: Former    Packs/day: 2.00    Types: Cigarettes    Quit date: 11/08/2019    Years since quitting: 2.1   Smokeless tobacco: Never  Vaping Use   Vaping Use: Never used  Substance and Sexual Activity   Alcohol use: Not Currently   Drug use: Yes    Types: Marijuana   Sexual activity: Not Currently  Other Topics Concern   Not on file  Social History Narrative   Not on file   Social Determinants of Health   Financial Resource Strain: Not on file  Food Insecurity: Not on file  Transportation Needs: Not on file  Physical Activity: Not on file  Stress: Not on file  Social Connections: Not on file  Intimate Partner Violence: Not on file    Family History  Problem Relation Age of Onset   Healthy Mother    Healthy Father      Current Outpatient Medications:    baclofen (LIORESAL) 10 MG tablet, TAKE 1 TABLET BY MOUTH THREE TIMES A DAY, Disp: 270 tablet, Rfl: 1   metoprolol tartrate (LOPRESSOR) 25 MG tablet, TAKE 1/2 TABLETS BY MOUTH TWICE A DAY, Disp: 90 tablet, Rfl:  1   oxyCODONE (OXY IR/ROXICODONE) 5 MG immediate release tablet, Take 1 tablet (5 mg total) by mouth every 4 (four) hours as needed for moderate pain or severe pain., Disp: 180 tablet, Rfl: 0   pregabalin (LYRICA) 75 MG capsule, Take 1 capsule (75 mg total) by mouth 2 (two) times daily., Disp: 60 capsule, Rfl: 2   sucralfate (CARAFATE) 1 g tablet, Take 1 g by mouth 4 (four) times daily -  with meals and at bedtime., Disp: , Rfl:    docusate sodium (COLACE) 100 MG capsule, Take 100 mg by mouth daily. (Patient not taking: Reported on 01/05/2022), Disp: , Rfl:    prochlorperazine (COMPAZINE) 10 MG tablet, Take 1 tablet (10 mg total) by mouth every 6 (six) hours as needed (Nausea or vomiting). (Patient not taking: Reported on 11/04/2021), Disp: 30 tablet, Rfl: 1 No current facility-administered medications for this visit.  Facility-Administered Medications Ordered in Other Visits:    heparin lock flush 100 unit/mL, 500 Units, Intravenous, Once, Sindy Guadeloupe,  MD   heparin lock flush 100 unit/mL, 500 Units, Intravenous, Once, Sindy Guadeloupe, MD   sodium chloride flush (NS) 0.9 % injection 10 mL, 10 mL, Intravenous, PRN, Sindy Guadeloupe, MD, 10 mL at 05/01/20 0900   sodium chloride flush (NS) 0.9 % injection 10 mL, 10 mL, Intravenous, PRN, Sindy Guadeloupe, MD, 10 mL at 01/29/21 [id number]  Physical exam:  Physical Exam Constitutional:      General: He is not in acute distress. Neck:     Comments: Surgical changes noted in his neck. Changes of hyperpigmentation noted in the neck Cardiovascular:     Rate and Rhythm: Normal rate and regular rhythm.     Heart sounds: Normal heart sounds.  Pulmonary:     Effort: Pulmonary effort is normal.     Breath sounds: Normal breath sounds.  Abdominal:     General: Bowel sounds are normal.     Palpations: Abdomen is soft.  Skin:    General: Skin is warm and dry.  Neurological:     Mental Status: He is alert and oriented to person, place, and time.     Comments:  Muscle power is intact in his hand muscles but otherwise no power in his arm or forearm muscles of the left upper extremity.     CMP Latest Ref Rng & Units 01/05/2022  Glucose 70 - 99 mg/dL 85  BUN 6 - 20 mg/dL 16  Creatinine 0.61 - 1.24 mg/dL 1.15  Sodium 135 - 145 mmol/L 134(L)  Potassium 3.5 - 5.1 mmol/L 3.5  Chloride 98 - 111 mmol/L 103  CO2 22 - 32 mmol/L 26  Calcium 8.9 - 10.3 mg/dL 8.9  Total Protein 6.5 - 8.1 g/dL 7.3  Total Bilirubin 0.3 - 1.2 mg/dL 0.6  Alkaline Phos 38 - 126 U/L 81  AST 15 - 41 U/L 15  ALT 0 - 44 U/L 10   CBC Latest Ref Rng & Units 01/05/2022  WBC 4.0 - 10.5 K/uL 5.2  Hemoglobin 13.0 - 17.0 g/dL 12.1(L)  Hematocrit 39.0 - 52.0 % 37.3(L)  Platelets 150 - 400 K/uL 483(H)     Assessment and plan- Patient is a 51 y.o. male  with recurrent metastatic adenocarcinoma of the lung stage IV presently with a large tumor involving the cervical spine s/p surgery and adjuvant radiation treatment with residual tumor.  He is here for on treatment assessment prior to cycle 4 of Enhertu  Counts okay to proceed with cycle 4 of Enhertu today and I will see him back in 3 weeks for cycle 5.  So far he is tolerating treatment well without any significant side effects.  No signs and symptoms suggestive of pneumonitis.  He will get an echocardiogram prior to his next visit with me.  I also reviewed CT chest abdomen pelvis as well as CT soft tissue neck findings with him.  Patient does not have any significant malignancy noted in his chest abdomen and pelvis.  1.4 cm left upper lobe lung nodule is overall stable.  CT soft tissue neck also shows resolution of left paraspinal tumor after surgery and radiation.  Plan is to continue Enhertu until progression or toxicity.  Unfortunately patient has not made any recovery of his neurological functions in his left upper extremity.  This is unlikely to recover at this time.  Patient understands that.  He will be following up with neurosurgery as  well   Visit Diagnosis 1. Malignant neoplasm of upper lobe of left lung ([place])  2. Encounter for monoclonal antibody treatment for malignancy      Dr. Randa Evens, MD, MPH [facility] [phone] 01/05/2022 4:10 PM

## 2022-01-07 ENCOUNTER — Inpatient Hospital Stay: Payer: 59

## 2022-01-11 ENCOUNTER — Other Ambulatory Visit: Payer: Self-pay

## 2022-01-11 ENCOUNTER — Telehealth: Payer: Self-pay | Admitting: *Deleted

## 2022-01-11 ENCOUNTER — Ambulatory Visit
Admission: RE | Admit: 2022-01-11 | Discharge: 2022-01-11 | Disposition: A | Discharge status: Home / Self Care | Payer: 59 | Source: Ambulatory Visit | Attending: Oncology | Admitting: Oncology

## 2022-01-11 DIAGNOSIS — Z5181 Encounter for therapeutic drug level monitoring: Secondary | ICD-10-CM | POA: Diagnosis not present

## 2022-01-11 DIAGNOSIS — C3412 Malignant neoplasm of upper lobe, left bronchus or lung: Secondary | ICD-10-CM | POA: Diagnosis not present

## 2022-01-11 DIAGNOSIS — R931 Abnormal findings on diagnostic imaging of heart and coronary circulation: Secondary | ICD-10-CM

## 2022-01-11 DIAGNOSIS — C3490 Malignant neoplasm of unspecified part of unspecified bronchus or lung: Secondary | ICD-10-CM

## 2022-01-11 DIAGNOSIS — Z0189 Encounter for other specified special examinations: Secondary | ICD-10-CM | POA: Diagnosis not present

## 2022-01-11 LAB — ECHOCARDIOGRAM COMPLETE
AR max vel: 2.9 cm2
AV Area VTI: 2.81 cm2
AV Area mean vel: 2.8 cm2
AV Mean grad: 2 mmHg
AV Peak grad: 4.5 mmHg
Ao pk vel: 1.06 m/s
Area-P 1/2: 5.88 cm2
Calc EF: 40 %
MV VTI: 2.46 cm2
S' Lateral: 3.73 cm
Single Plane A2C EF: 47.1 %
Single Plane A4C EF: 35.2 %

## 2022-01-11 NOTE — Telephone Encounter (Signed)
Per Dr. Janese Banks, pt has decreased ejection fraction on recent ECHO and needs to be referred to cardiology. Alos, recommended to repeat echo in 4 weeks and reschedule next treatment to after repeat echo. Pt called and made aware of results and recommendations. All questions answered during visit. Instructed to call back with any questions or needs.

## 2022-01-11 NOTE — Progress Notes (Signed)
*  PRELIMINARY RESULTS* Echocardiogram 2D Echocardiogram has been performed.  Kyle Guerrero 01/11/2022, 9:38 AM

## 2022-01-12 ENCOUNTER — Encounter: Payer: Self-pay | Admitting: Oncology

## 2022-01-20 ENCOUNTER — Other Ambulatory Visit: Payer: Self-pay

## 2022-01-20 ENCOUNTER — Encounter: Payer: Self-pay | Admitting: Cardiology

## 2022-01-20 ENCOUNTER — Ambulatory Visit (INDEPENDENT_AMBULATORY_CARE_PROVIDER_SITE_OTHER): Payer: 59 | Admitting: Cardiology

## 2022-01-20 VITALS — BP 120/82 | HR 79 | Ht 71.0 in | Wt 149.0 lb

## 2022-01-20 DIAGNOSIS — C3490 Malignant neoplasm of unspecified part of unspecified bronchus or lung: Secondary | ICD-10-CM | POA: Diagnosis not present

## 2022-01-20 DIAGNOSIS — I5020 Unspecified systolic (congestive) heart failure: Secondary | ICD-10-CM

## 2022-01-20 MED ORDER — METOPROLOL SUCCINATE ER 25 MG PO TB24: 25.0000 mg | ORAL_TABLET | Freq: Every day | ORAL | 5 refills | Status: AC

## 2022-01-20 MED ORDER — METOPROLOL TARTRATE 100 MG PO TABS: 100.0000 mg | ORAL_TABLET | Freq: Once | ORAL | 0 refills | Status: DC

## 2022-01-20 MED ORDER — IVABRADINE HCL 5 MG PO TABS: 10.0000 mg | ORAL_TABLET | Freq: Once | ORAL | 0 refills | Status: AC

## 2022-01-20 MED ORDER — LOSARTAN POTASSIUM 25 MG PO TABS: 25.0000 mg | ORAL_TABLET | Freq: Every day | ORAL | 3 refills | Status: AC

## 2022-01-20 NOTE — Progress Notes (Signed)
Cardiology Office Note:    Date:  01/20/2022   ID:  Kyle Guerrero, DOB [birth year], MRN [id number]  PCP:  Default, Provider, MD   [place] [facility] Cardiologist:  Kate Sable, MD     Referring MD: Sindy Guadeloupe, MD   Chief Complaint  Patient presents with   New Patient (Initial Visit)    Referred by PCP for Decreased EF. Meds reviewed verbally with patient.    Kyle Guerrero is a 51 y.o. male who is being seen today for the evaluation of abnormal echocardiogram at the request of Sindy Guadeloupe, MD.   History of Present Illness:    Kyle Guerrero is a 51 y.o. male with a hx of non-small cell lung cancer stage IV on chemo, former smoker x20+ years who presents due to abnormal echocardiogram.  Patient was diagnosed with lung cancer in 2020.  He was started on chemotherapy for lung cancer treatment.  Cervical mass has been noted, underwent resection at [facility].  He has left arm weakness with difficulty using his biceps.  Denies chest pain, shortness of breath, edema.  Echocardiogram dated 09/2021 showed EF 55 to 60%.  Repeat echo 01/11/2022 Showed reduction in EF to 40 to 50%.  Chemotherapy was held after echo showed reduced EF  Chart review indicated patient underwent CarboTaxol chemotherapy followed by CarboTaxol and Keytruda.  Was on Keytruda maintenance.    Past Medical History:  Diagnosis Date   Asthma    Lung cancer ([place])     Past Surgical History:  Procedure Laterality Date   CYST EXCISION     IR IMAGING GUIDED PORT INSERTION  12/05/2019   VIDEO BRONCHOSCOPY WITH ENDOBRONCHIAL ULTRASOUND Left 11/22/2019   Procedure: VIDEO BRONCHOSCOPY WITH ENDOBRONCHIAL ULTRASOUND;  Surgeon: Tyler Pita, MD;  Location: [facility] ORS;  Service: Thoracic;  Laterality: Left;    Current Medications: Current Meds  Medication Sig   baclofen (LIORESAL) 10 MG tablet TAKE 1 TABLET BY MOUTH THREE TIMES A DAY   docusate sodium (COLACE) 100 MG capsule Take 100 mg by mouth  daily.   ivabradine (CORLANOR) 5 MG TABS tablet Take 2 tablets (10 mg total) by mouth once for 1 dose. Take 2 hours prior to your CT scan.   losartan (COZAAR) 25 MG tablet Take 1 tablet (25 mg total) by mouth daily.   metoprolol succinate (TOPROL XL) 25 MG 24 hr tablet Take 1 tablet (25 mg total) by mouth daily.   metoprolol tartrate (LOPRESSOR) 100 MG tablet Take 1 tablet (100 mg total) by mouth once for 1 dose. Take 2 hours prior to your CT scan.   oxyCODONE (OXY IR/ROXICODONE) 5 MG immediate release tablet Take 1 tablet (5 mg total) by mouth every 4 (four) hours as needed for moderate pain or severe pain.   pregabalin (LYRICA) 75 MG capsule Take 1 capsule (75 mg total) by mouth 2 (two) times daily.   prochlorperazine (COMPAZINE) 10 MG tablet Take 1 tablet (10 mg total) by mouth every 6 (six) hours as needed (Nausea or vomiting).   sucralfate (CARAFATE) 1 g tablet Take 1 g by mouth 4 (four) times daily -  with meals and at bedtime.   [DISCONTINUED] metoprolol tartrate (LOPRESSOR) 25 MG tablet TAKE 1/2 TABLETS BY MOUTH TWICE A DAY     Allergies:   Patient has no known allergies.   Social History   Socioeconomic History   Marital status: Single    Spouse name: Not on file   Number of children:  Not on file   Years of education: Not on file   Highest education level: Not on file  Occupational History   Not on file  Tobacco Use   Smoking status: Former    Packs/day: 2.00    Types: Cigarettes    Quit date: 11/08/2019    Years since quitting: 2.2   Smokeless tobacco: Never  Vaping Use   Vaping Use: Never used  Substance and Sexual Activity   Alcohol use: Not Currently   Drug use: Yes    Types: Marijuana   Sexual activity: Not Currently  Other Topics Concern   Not on file  Social History Narrative   Not on file   Social Determinants of Health   Financial Resource Strain: Not on file  Food Insecurity: Not on file  Transportation Needs: Not on file  Physical Activity: Not on  file  Stress: Not on file  Social Connections: Not on file     Family History: The patient's family history includes Healthy in his father and mother.  ROS:   Please see the history of present illness.     All other systems reviewed and are negative.  EKGs/Labs/Other Studies Reviewed:    The following studies were reviewed today:   EKG:  EKG is  ordered today.  The ekg ordered today demonstrates normal sinus rhythm, nonspecific ST changes  Recent Labs: 06/04/2021: TSH 1.273 01/05/2022: ALT 10; BUN 16; Creatinine, Ser 1.15; Hemoglobin 12.1; Platelets 483; Potassium 3.5; Sodium 134  Recent Lipid Panel No results found for: CHOL, TRIG, HDL, CHOLHDL, VLDL, LDLCALC, LDLDIRECT   Risk Assessment/Calculations:          Physical Exam:    VS:  BP 120/82 (BP Location: Right Arm, Patient Position: Sitting, Cuff Size: Normal)    Pulse 79    Ht 5\' 11"  (1.803 m)    Wt 149 lb (67.6 kg)    SpO2 99%    BMI 20.78 kg/m     Wt Readings from Last 3 Encounters:  01/20/22 149 lb (67.6 kg)  01/05/22 150 lb (68 kg)  12/06/21 149 lb 9.6 oz (67.9 kg)     GEN:  Well nourished, well developed in no acute distress HEENT: Normal NECK: No JVD; No carotid bruits LYMPHATICS: No lymphadenopathy CARDIAC: RRR, no murmurs, rubs, gallops RESPIRATORY:  Clear to auscultation without rales, wheezing or rhonchi  ABDOMEN: Soft, non-tender, non-distended MUSCULOSKELETAL:  No edema; left arm weakness noted SKIN: Warm and dry NEUROLOGIC:  Alert and oriented x 3 PSYCHIATRIC:  Normal affect   ASSESSMENT:    1. HFrEF (heart failure with reduced ejection fraction) ([place])   2. Malignant neoplasm of lung, unspecified laterality, unspecified part of lung ([place])    PLAN:    In order of problems listed above:  Mild to moderately reduced EF 40 to 45%.  Likely nonischemic from chemotherapy as patient used CarboTaxol which is known to be cardiotoxic.  There are reports of cardiotoxicity with Keytruda although rare  about 1%.  Switch Lopressor to Toprol-XL 25 mg daily.  Start low-dose losartan 25 mg daily.  Obtain coronary CTA to rule out CAD.  Plan to repeat echo in about 3 months Metastatic lung cancer, management as per oncology.  Follow-up in 1 month       Medication Adjustments/Labs and Tests Ordered: Current medicines are reviewed at length with the patient today.  Concerns regarding medicines are outlined above.  Orders Placed This Encounter  Procedures   CT CORONARY MORPH W/CTA COR W/SCORE  W/CA W/CM &/OR WO/CM   EKG 12-Lead   Meds ordered this encounter  Medications   metoprolol tartrate (LOPRESSOR) 100 MG tablet    Sig: Take 1 tablet (100 mg total) by mouth once for 1 dose. Take 2 hours prior to your CT scan.    Dispense:  1 tablet    Refill:  0   metoprolol succinate (TOPROL XL) 25 MG 24 hr tablet    Sig: Take 1 tablet (25 mg total) by mouth daily.    Dispense:  30 tablet    Refill:  5   ivabradine (CORLANOR) 5 MG TABS tablet    Sig: Take 2 tablets (10 mg total) by mouth once for 1 dose. Take 2 hours prior to your CT scan.    Dispense:  2 tablet    Refill:  0   losartan (COZAAR) 25 MG tablet    Sig: Take 1 tablet (25 mg total) by mouth daily.    Dispense:  30 tablet    Refill:  3    Patient Instructions  Medication Instructions:   Your physician has recommended you make the following change in your medication:     STOP taking your Metoprolol Tartrate (Lopressor)  2.    START taking Metoprolol Succinate (Toprol XL) 25 MG once a day.  3.    START taking Losartan (Cozaar) 25 MG once a day.    *If you need a refill on your cardiac medications before your next appointment, please call your pharmacy*   Lab Work:  None Ordered If you have labs (blood work) drawn today and your tests are completely normal, you will receive your results only by: [place] (if you have MyChart) OR A paper copy in the mail If you have any lab test that is abnormal or we need to  change your treatment, we will call you to review the results.   Testing/Procedures:   Your physician has requested that you have cardiac CT. Cardiac computed tomography (CT) is a painless test that uses an x-ray machine to take clear, detailed pictures of your heart.   Your cardiac CT will be scheduled at:  [facility] [address] [place], [place] [address] [phone]   On Mon 01/31/22 at 10:15   Please arrive 15 mins early for check-in and test prep.    Please follow these instructions carefully (unless otherwise directed):    On the Night Before the Test: Be sure to Drink plenty of water. Do not consume any caffeinated/decaffeinated beverages or chocolate 12 hours prior to your test.   On the Day of the Test: Drink plenty of water until 1 hour prior to the test. Do not eat any food 4 hours prior to the test. Do not take your Losartan the morning of your CT, may take all other medications prior to the test.  Take metoprolol (Lopressor) two hours prior to test. Take Ivabradine (Corlanor) two hours prior to test.        After the Test: Drink plenty of water. After receiving IV contrast, you may experience a mild flushed feeling. This is normal. On occasion, you may experience a mild rash up to 24 hours after the test. This is not dangerous. If this occurs, you can take Benadryl 25 mg and increase your fluid intake. If you experience trouble breathing, this can be serious. If it is severe call 911 IMMEDIATELY. If it is mild, please call our office. If you take any of  these medications: Glipizide/Metformin, Avandament, Glucavance, please do not take 48 hours after completing test unless otherwise instructed.  Please allow 2-4 weeks for scheduling of routine cardiac CTs. Some insurance companies require a pre-authorization which may delay scheduling of this test.   For non-scheduling related questions, please contact the  cardiac imaging nurse navigator should you have any questions/concerns: Marchia Bond, Cardiac Imaging Nurse Navigator Gordy Clement, Cardiac Imaging Nurse Navigator [facility] Direct Office Dial: [phone]   For scheduling needs, including cancellations and rescheduling, please call [place], [phone].    Follow-Up: At [facility], you and your health needs are our priority.  As part of our continuing mission to provide you with exceptional heart care, we have created designated Provider Care Teams.  These Care Teams include your primary Cardiologist (physician) and Advanced Practice Providers (APPs -  Physician Assistants and Nurse Practitioners) who all work together to provide you with the care you need, when you need it.  We recommend signing up for the patient portal called "MyChart".  Sign up information is provided on this After Visit Summary.  MyChart is used to connect with patients for Virtual Visits (Telemedicine).  Patients are able to view lab/test results, encounter notes, upcoming appointments, etc.  Non-urgent messages can be sent to your provider as well.   To learn more about what you can do with MyChart, go to [email or web address].    Your next appointment:   1 month(s)  The format for your next appointment:   In Person  Provider:   Kate Sable, MD  ONLY    Other Instructions     Signed, Kate Sable, MD  01/20/2022 12:28 PM    [place]

## 2022-01-20 NOTE — Patient Instructions (Addendum)
Medication Instructions:   Your physician has recommended you make the following change in your medication:     STOP taking your Metoprolol Tartrate (Lopressor)  2.    START taking Metoprolol Succinate (Toprol XL) 25 MG once a day.  3.    START taking Losartan (Cozaar) 25 MG once a day.    *If you need a refill on your cardiac medications before your next appointment, please call your pharmacy*   Lab Work:  None Ordered If you have labs (blood work) drawn today and your tests are completely normal, you will receive your results only by: [place] (if you have MyChart) OR A paper copy in the mail If you have any lab test that is abnormal or we need to change your treatment, we will call you to review the results.   Testing/Procedures:   Your physician has requested that you have cardiac CT. Cardiac computed tomography (CT) is a painless test that uses an x-ray machine to take clear, detailed pictures of your heart.   Your cardiac CT will be scheduled at:  [facility] [address] [place], [place] [address] [phone]   On Mon 01/31/22 at 10:15   Please arrive 15 mins early for check-in and test prep.    Please follow these instructions carefully (unless otherwise directed):    On the Night Before the Test: Be sure to Drink plenty of water. Do not consume any caffeinated/decaffeinated beverages or chocolate 12 hours prior to your test.   On the Day of the Test: Drink plenty of water until 1 hour prior to the test. Do not eat any food 4 hours prior to the test. Do not take your Losartan the morning of your CT, may take all other medications prior to the test.  Take metoprolol (Lopressor) two hours prior to test. Take Ivabradine (Corlanor) two hours prior to test.        After the Test: Drink plenty of water. After receiving IV contrast, you may experience a mild flushed feeling. This is normal. On  occasion, you may experience a mild rash up to 24 hours after the test. This is not dangerous. If this occurs, you can take Benadryl 25 mg and increase your fluid intake. If you experience trouble breathing, this can be serious. If it is severe call 911 IMMEDIATELY. If it is mild, please call our office. If you take any of these medications: Glipizide/Metformin, Avandament, Glucavance, please do not take 48 hours after completing test unless otherwise instructed.  Please allow 2-4 weeks for scheduling of routine cardiac CTs. Some insurance companies require a pre-authorization which may delay scheduling of this test.   For non-scheduling related questions, please contact the cardiac imaging nurse navigator should you have any questions/concerns: Marchia Bond, Cardiac Imaging Nurse Navigator Gordy Clement, Cardiac Imaging Nurse Navigator [place] [facility] Direct Office Dial: [phone]   For scheduling needs, including cancellations and rescheduling, please call [place], [phone].    Follow-Up: At [facility], you and your health needs are our priority.  As part of our continuing mission to provide you with exceptional heart care, we have created designated Provider Care Teams.  These Care Teams include your primary Cardiologist (physician) and Advanced Practice Providers (APPs -  Physician Assistants and Nurse Practitioners) who all work together to provide you with the care you need, when you need it.  We recommend signing up for the patient portal called "MyChart".  Sign up information  is provided on this After Visit Summary.  MyChart is used to connect with patients for Virtual Visits (Telemedicine).  Patients are able to view lab/test results, encounter notes, upcoming appointments, etc.  Non-urgent messages can be sent to your provider as well.   To learn more about what you can do with MyChart, go to [email or web address].    Your next appointment:   1  month(s)  The format for your next appointment:   In Person  Provider:   Kate Sable, MD  ONLY    Other Instructions

## 2022-01-26 ENCOUNTER — Ambulatory Visit: Payer: 59 | Admitting: Oncology

## 2022-01-26 ENCOUNTER — Ambulatory Visit: Payer: 59

## 2022-01-26 ENCOUNTER — Other Ambulatory Visit: Payer: Self-pay

## 2022-01-26 ENCOUNTER — Inpatient Hospital Stay: Payer: 59 | Attending: Oncology

## 2022-01-26 ENCOUNTER — Other Ambulatory Visit: Payer: 59

## 2022-01-26 DIAGNOSIS — Z85118 Personal history of other malignant neoplasm of bronchus and lung: Secondary | ICD-10-CM | POA: Diagnosis not present

## 2022-01-26 DIAGNOSIS — R29898 Other symptoms and signs involving the musculoskeletal system: Secondary | ICD-10-CM | POA: Diagnosis not present

## 2022-01-26 DIAGNOSIS — C3490 Malignant neoplasm of unspecified part of unspecified bronchus or lung: Secondary | ICD-10-CM | POA: Diagnosis not present

## 2022-01-26 DIAGNOSIS — M4323 Fusion of spine, cervicothoracic region: Secondary | ICD-10-CM | POA: Diagnosis not present

## 2022-01-26 DIAGNOSIS — M47816 Spondylosis without myelopathy or radiculopathy, lumbar region: Secondary | ICD-10-CM | POA: Diagnosis not present

## 2022-01-26 DIAGNOSIS — M47814 Spondylosis without myelopathy or radiculopathy, thoracic region: Secondary | ICD-10-CM | POA: Diagnosis not present

## 2022-01-26 DIAGNOSIS — C7951 Secondary malignant neoplasm of bone: Secondary | ICD-10-CM | POA: Diagnosis not present

## 2022-01-26 DIAGNOSIS — M47812 Spondylosis without myelopathy or radiculopathy, cervical region: Secondary | ICD-10-CM | POA: Diagnosis not present

## 2022-01-26 DIAGNOSIS — Z23 Encounter for immunization: Secondary | ICD-10-CM | POA: Diagnosis not present

## 2022-01-26 DIAGNOSIS — Z981 Arthrodesis status: Secondary | ICD-10-CM | POA: Diagnosis not present

## 2022-01-26 NOTE — Progress Notes (Signed)
Nutrition Follow-up:  Patient with recurrent metastatic adenocarcinoma of lung.  Large tumor found in cervical spine with mass displacing the left pharynx, s/p surgery.  Patient has completed radiation and receiving chemotherapy.    Spoke with patient via phone for nutrition follow-up.  Patient reports that he continues to go out to eat for breakfast 2-3 times per week.  Eats lunch and dinner.  Says that he tried ensure complete and able to drink 1 a day without stomach upset or diarrhea.  Wants to start walking to increase his appetite.  "I want to gain weight."    Denies any nutrition questions or concerns   Medications: reviewed  Labs: reviewed  Anthropometrics:   Weight 149 lb on 1/26  150 lb 1/11 144 lb 12.8 oz on 11/10 146 lb on 11/10 153 lb on 9/21 174 lb on 7/26   NUTRITION DIAGNOSIS: Inadequate oral intake improving   INTERVENTION:  Encouraged patient to continue high calorie, high protein foods for weight gain Continue high calorie oral nutrition supplement    MONITORING, EVALUATION, GOAL: weight trends, intake   NEXT VISIT: as needed  Elfego Giammarino B. Zenia Resides, [place], [place] Registered Dietitian [phone] (mobile)

## 2022-01-28 ENCOUNTER — Telehealth (HOSPITAL_COMMUNITY): Payer: Self-pay | Admitting: *Deleted

## 2022-01-28 ENCOUNTER — Ambulatory Visit: Payer: 59

## 2022-01-28 DIAGNOSIS — R8271 Bacteriuria: Secondary | ICD-10-CM | POA: Diagnosis not present

## 2022-01-28 DIAGNOSIS — R69 Illness, unspecified: Secondary | ICD-10-CM | POA: Diagnosis not present

## 2022-01-28 DIAGNOSIS — R350 Frequency of micturition: Secondary | ICD-10-CM | POA: Diagnosis not present

## 2022-01-28 DIAGNOSIS — N390 Urinary tract infection, site not specified: Secondary | ICD-10-CM | POA: Diagnosis not present

## 2022-01-28 NOTE — Telephone Encounter (Signed)
Reaching out to patient to offer assistance regarding upcoming cardiac imaging study; pt verbalizes understanding of appt date/time, parking situation and where to check in, pre-test NPO status and medications ordered, and verified current allergies; name and call back number provided for further questions should they arise  Gordy Clement RN Navigator Cardiac Imaging [facility] [phone] office [phone] cell  Patient to take 100mg  metoprolol tartrate and 10mg  ivabradine two hours prior to cardiac CT scan.

## 2022-01-31 ENCOUNTER — Other Ambulatory Visit: Payer: Self-pay

## 2022-01-31 ENCOUNTER — Ambulatory Visit
Admission: RE | Admit: 2022-01-31 | Discharge: 2022-01-31 | Disposition: A | Discharge status: Home / Self Care | Payer: 59 | Source: Ambulatory Visit | Attending: Cardiology | Admitting: Cardiology

## 2022-01-31 DIAGNOSIS — I5020 Unspecified systolic (congestive) heart failure: Secondary | ICD-10-CM | POA: Insufficient documentation

## 2022-01-31 IMAGING — CT CT HEART MORP W/ CTA COR W/ SCORE W/ CA W/CM &/OR W/O CM
1 of 12 series · 3 of 20 positions shown, 4 images · non-contrast
Comparison: Chest CT [DATE].

Addendum:
CLINICAL DATA: Reduced LVEF, evaluate for CAD

EXAM:
Cardiac/Coronary  CTA
TECHNIQUE: The patient was scanned on a Siemens Somatom go.Top scanner.

[Series 6: multiphase % cta coronary 0.60 · axial · 0.31mm/px · z∈[-1109,-1049]mm · 3 of 3267 slices shown, 4 images]
[im 817/3267  vessel]
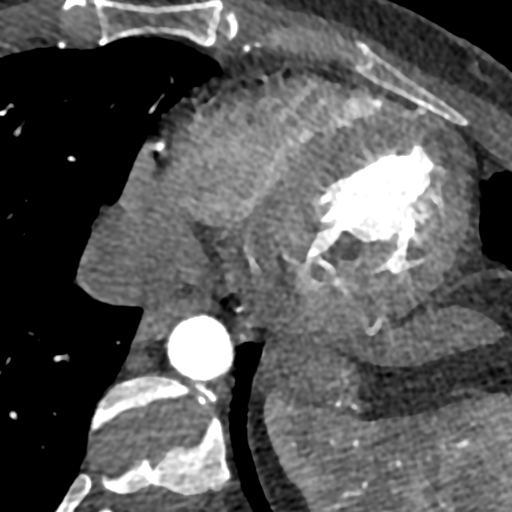
[im 817/3267  lung]
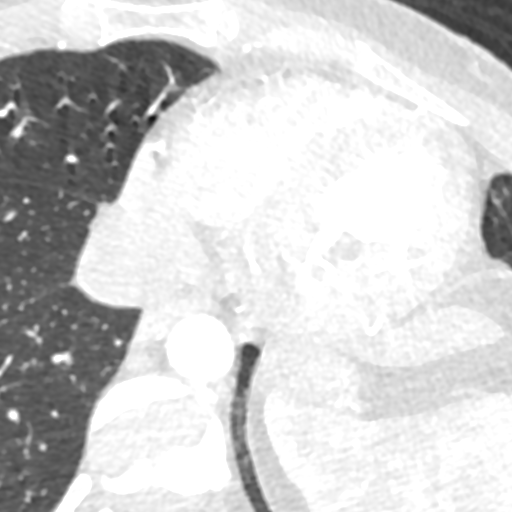
[im 1634/3267  vessel]
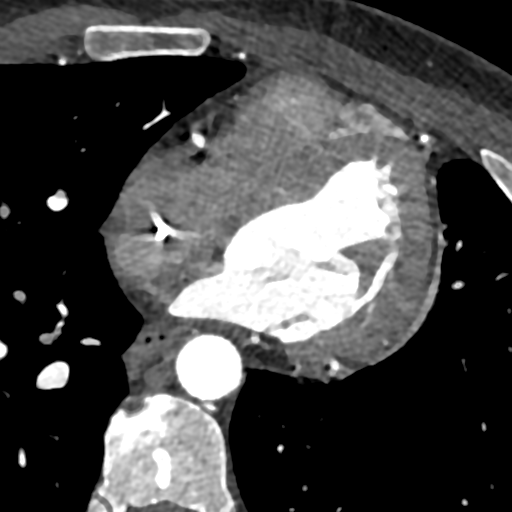
[im 2450/3267  vessel]
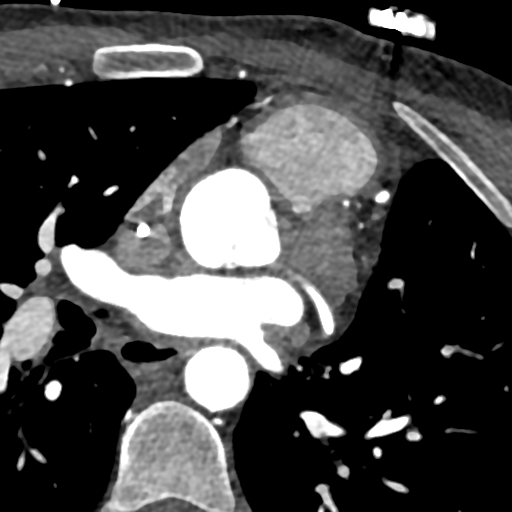

[3 of 20 positions shown; findings below may reference images not displayed]

:
A retrospective scan was triggered in the descending thoracic aorta.
Axial non-contrast 3 mm slices were carried out through the heart.
The data set was analyzed on a dedicated work station and scored
using the Agatson method. Gantry rotation speed was 330 msecs and
collimation was .6 mm. 100mg of metoprolol, 10mg iv ivabradine and
0.8 mg of sl NTG was given. The 3D data set was reconstructed in 5%
intervals of the 60-95 % of the R-R cycle. Diastolic phases were
analyzed on a dedicated work station using MPR, MIP and VRT modes.
The patient received 75 cc of contrast.
FINDINGS: Aorta:  Normal size.  No calcifications.  No dissection.

Aortic Valve:  Trileaflet.  No calcifications.

Coronary Arteries:  Normal coronary origin.  Right dominance.

RCA is a dominant artery that gives rise to PDA and PLA. There is no
plaque.

Left main is a large artery that gives rise to LAD and LCX arteries.

LAD has no plaque.

LCX is a non-dominant artery that gives rise to one OM1 branch.
There is no plaque.

Other findings:

Normal pulmonary vein drainage into the left atrium.

Normal left atrial appendage without a thrombus.

Normal size of the pulmonary artery.
IMPRESSION: 1. Normal coronary calcium score of 0. Patient is low risk for
coronary events.

2. Normal coronary origin with right dominance.

3. No evidence of CAD.

4. CAD-RADS 0. Consider non-ischemic causes of cardiomyopathy.

EXAM:
OVER-READ INTERPRETATION  CT CHEST

The following report is an over-read performed by radiologist Dr.
does not include interpretation of cardiac or coronary anatomy or
pathology. The coronary CTA interpretation by the cardiologist is
attached.
FINDINGS: Vascular: Visualized thoracic aorta is normal in size. There is a
central venous catheter tip terminating in the right atrium.

Mediastinum/Nodes: No lymphadenopathy. The distal trachea is
unremarkable.

Lungs/Pleura: Partially visualized left apical medial consolidation
and bronchiectasis likely due to post radiation/post treatment
changes with atelectasis. Known oval nodule in the left apex is not
visualized within the field of view on this exam. No other acute
pulmonary findings within the field of view.

Upper Abdomen: No acute abnormality within the field of view.

Musculoskeletal: No acute osseous abnormality.No suspicious lytic or
blastic lesions.
IMPRESSION: Partially visualized left apical medial consolidation,
bronchiectasis, and atelectasis likely due to post radiation/post
treatment changes. No other acute extracardiac findings within the
field of view.

*** End of Addendum ***
:
A retrospective scan was triggered in the descending thoracic aorta.
Axial non-contrast 3 mm slices were carried out through the heart.
The data set was analyzed on a dedicated work station and scored
using the Agatson method. Gantry rotation speed was 330 msecs and
collimation was .6 mm. 100mg of metoprolol, 10mg iv ivabradine and
0.8 mg of sl NTG was given. The 3D data set was reconstructed in 5%
intervals of the 60-95 % of the R-R cycle. Diastolic phases were
analyzed on a dedicated work station using MPR, MIP and VRT modes.
The patient received 75 cc of contrast.
FINDINGS: Aorta:  Normal size.  No calcifications.  No dissection.

Aortic Valve:  Trileaflet.  No calcifications.

Coronary Arteries:  Normal coronary origin.  Right dominance.

RCA is a dominant artery that gives rise to PDA and PLA. There is no
plaque.

Left main is a large artery that gives rise to LAD and LCX arteries.

LAD has no plaque.

LCX is a non-dominant artery that gives rise to one OM1 branch.
There is no plaque.

Other findings:

Normal pulmonary vein drainage into the left atrium.

Normal left atrial appendage without a thrombus.

Normal size of the pulmonary artery.
IMPRESSION: 1. Normal coronary calcium score of 0. Patient is low risk for
coronary events.

2. Normal coronary origin with right dominance.

3. No evidence of CAD.

4. CAD-RADS 0. Consider non-ischemic causes of cardiomyopathy.

## 2022-01-31 MED ORDER — NITROGLYCERIN 0.4 MG SL SUBL: 0.8000 mg | SUBLINGUAL_TABLET | Freq: Once | SUBLINGUAL | Status: DC

## 2022-01-31 MED ORDER — METOPROLOL TARTRATE 5 MG/5ML IV SOLN: 10.0000 mg | Freq: Once | INTRAVENOUS | Status: DC

## 2022-01-31 MED ORDER — NITROGLYCERIN 0.4 MG SL SUBL
0.4000 mg | SUBLINGUAL_TABLET | Freq: Once | SUBLINGUAL | Status: AC
  Administered 2022-01-31: 0.4 mg via SUBLINGUAL

## 2022-01-31 MED ORDER — IOHEXOL 350 MG/ML SOLN
75.0000 mL | Freq: Once | INTRAVENOUS | Status: AC | PRN
  Administered 2022-01-31: 75 mL via INTRAVENOUS

## 2022-01-31 NOTE — Progress Notes (Signed)
Patient tolerated procedure well. Ambulate w/o difficulty. Denies any lightheadedness or being dizzy. Pt denies any pain at this time. Sitting in chair, drinking water provided. P is encouraged to drink additional water throughout the day and reason explained to patient. Patient verbalized understanding and all questions answered. ABC intact. No further needs at this time. Discharge from procedure area w/o issues.

## 2022-02-09 ENCOUNTER — Other Ambulatory Visit: Payer: Self-pay

## 2022-02-09 ENCOUNTER — Ambulatory Visit
Admission: RE | Admit: 2022-02-09 | Discharge: 2022-02-09 | Disposition: A | Discharge status: Home / Self Care | Payer: 59 | Source: Ambulatory Visit | Attending: Oncology | Admitting: Oncology

## 2022-02-09 DIAGNOSIS — I340 Nonrheumatic mitral (valve) insufficiency: Secondary | ICD-10-CM | POA: Diagnosis not present

## 2022-02-09 DIAGNOSIS — C3490 Malignant neoplasm of unspecified part of unspecified bronchus or lung: Secondary | ICD-10-CM | POA: Insufficient documentation

## 2022-02-09 DIAGNOSIS — Z01818 Encounter for other preprocedural examination: Secondary | ICD-10-CM | POA: Insufficient documentation

## 2022-02-09 DIAGNOSIS — Z0189 Encounter for other specified special examinations: Secondary | ICD-10-CM | POA: Diagnosis not present

## 2022-02-09 LAB — ECHOCARDIOGRAM COMPLETE
AR max vel: 2.11 cm2
AV Area VTI: 2.01 cm2
AV Area mean vel: 1.93 cm2
AV Mean grad: 3 mmHg
AV Peak grad: 4.4 mmHg
Ao pk vel: 1.05 m/s
Area-P 1/2: 3.95 cm2
S' Lateral: 3 cm

## 2022-02-09 NOTE — Progress Notes (Signed)
*  PRELIMINARY RESULTS* Echocardiogram 2D Echocardiogram has been performed.  Kyle Guerrero 02/09/2022, 10:39 AM

## 2022-02-11 ENCOUNTER — Inpatient Hospital Stay: Payer: 59

## 2022-02-11 ENCOUNTER — Other Ambulatory Visit: Payer: Self-pay | Admitting: *Deleted

## 2022-02-11 ENCOUNTER — Inpatient Hospital Stay (HOSPITAL_BASED_OUTPATIENT_CLINIC_OR_DEPARTMENT_OTHER): Payer: 59 | Admitting: Oncology

## 2022-02-11 DIAGNOSIS — H5712 Ocular pain, left eye: Secondary | ICD-10-CM | POA: Diagnosis not present

## 2022-02-11 DIAGNOSIS — C3490 Malignant neoplasm of unspecified part of unspecified bronchus or lung: Secondary | ICD-10-CM | POA: Diagnosis not present

## 2022-02-11 DIAGNOSIS — R519 Headache, unspecified: Secondary | ICD-10-CM

## 2022-02-11 NOTE — Progress Notes (Signed)
I connected with Kyle Guerrero on 02/11/22 at  9:45 AM EST by video enabled telemedicine visit and verified that I am speaking with the correct person using two identifiers.   I discussed the limitations, risks, security and privacy concerns of performing an evaluation and management service by telemedicine and the availability of in-person appointments. I also discussed with the patient that there may be a patient responsible charge related to this service. The patient expressed understanding and agreed to proceed.  Other persons participating in the visit and their role in the encounter:  none  Patient's location:  home Provider's location:  home (due to illness)  Chief Complaint: Discuss echocardiogram results and further management  History of present illness: patient is a 51 year old male who presented to the ER with symptoms of heaviness in his chest and upper left chest discomfort he underwent CT angio chest to rule out PE which showed 3.8 x 3.3 cm left upper palpable lung mass along with 4.4 x 3.3 cm lobulated mass in the aortopulmonary window and 2.6 x 2.4 cm left hilar mass all concerning for malignancy.  Patient has also seen pulmonary and has been set up for bronchoscopy and EBUS guided biopsy on 11/23/2019.  Patient underwent.  PET CT scan which showed a hypermetabolic spiculated 3.5 cm of 5 left upper lobe lung mass, adjacent hypermetabolic 3.2 x 1.2 cm pleural metastases in the medial posterior by the left pleural space along with scalloping of the adjacent posterior left third rib.  Hypermetabolic infiltrative left perihilar conglomerate nodal metastases measuring up to 7.3 x 3.6 cm and 0.8 cm high left mediastinal node between the left brachiocephalic vein and left subclavian artery.  No evidence of distant metastatic disease   Biopsy showed non-small cell lung cancer but further characterization could not be determined.  Insufficient tissue for NGS testing. Repeat biopsy done.  Results of NGS testing showed PD-L1 50%.  Tumor mutational burden high.  ERBB2 copy number again. 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    Patient completed concurrent chemoradiation with carbotaxol chemotherapy followed by 2 cycles of carbotaxol Keytruda and was on maintenance Keytruda   Patient was complaining of left shoulder pain and underwent MRI of the shoulder which showed tear in the supraspinatus tendon.  He was subsequently seen by [facility] and underwent MRI of the cervical spine which showed a 4.6 x 4.8 x 8.2 cm mass in the left prevertebral region from C2-C7 levels.  The mass partially encases the left vertebral artery and abuts the preforaminal segment.  Invades the vertebral bodies and edema of the left C5 articular pillar.  There is slight extension into the left C4-C5 thecal sac without central thecal sac compression.  The mass displaces the left pharynx anteriorly along the left carotid sheath.  Patient had a repeat biopsy which was consistent with adenocarcinoma.   Patient underwent palliative radiation treatment to his neck mass and completed 4 cycles of carboplatin and [name] with excellent response to treatment and is currently on maintenance [name].  Repeat NGS testing was also performed which did not show any evidence of actionable mutations other than ERBB2 gain   Patient had recurrence of his neck pain and repeat imaging showedInterim increase in the size of his neck mass and patient went for second opinion for both medical oncology as well as neurosurgery continue current was decided to cooperate upon this mass for definitive resection.  Patient underwent resection of cervical tumor which showed poorly differentiated metastatic adenocarcinoma on 08/29/2021.  TTF-1 negative.      Interval history reports having frequent headaches which particularly get worse when patient goes out in the sun.  Says that the headache is often centered around his left eye.  Denies  any pain in his left eye or changes in visual acuity or swelling.   Review of Systems  Constitutional:  Positive for malaise/fatigue. Negative for chills, fever and weight loss.  HENT:  Negative for congestion, ear discharge and nosebleeds.   Eyes:  Negative for blurred vision.  Respiratory:  Negative for cough, hemoptysis, sputum production, shortness of breath and wheezing.   Cardiovascular:  Negative for chest pain, palpitations, orthopnea and claudication.  Gastrointestinal:  Negative for abdominal pain, blood in stool, constipation, diarrhea, heartburn, melena, nausea and vomiting.  Genitourinary:  Negative for dysuria, flank pain, frequency, hematuria and urgency.  Musculoskeletal:  Negative for back pain, joint pain and myalgias.  Skin:  Negative for rash.  Neurological:  Negative for dizziness, tingling, focal weakness, seizures, weakness and headaches.  Endo/Heme/Allergies:  Does not bruise/bleed easily.  Psychiatric/Behavioral:  Negative for depression and suicidal ideas. The patient does not have insomnia.    No Known Allergies  Past Medical History:  Diagnosis Date   Asthma    Lung cancer ([place])     Past Surgical History:  Procedure Laterality Date   CYST EXCISION     IR IMAGING GUIDED PORT INSERTION  12/05/2019   VIDEO BRONCHOSCOPY WITH ENDOBRONCHIAL ULTRASOUND Left 11/22/2019   Procedure: VIDEO BRONCHOSCOPY WITH ENDOBRONCHIAL ULTRASOUND;  Surgeon: Tyler Pita, MD;  Location: [facility] ORS;  Service: Thoracic;  Laterality: Left;    Social History   Socioeconomic History   Marital status: Single    Spouse name: Not on file   Number of children: Not on file   Years of education: Not on file   Highest education level: Not on file  Occupational History   Not on file  Tobacco Use   Smoking status: Former    Packs/day: 2.00    Types: Cigarettes    Quit date: 11/08/2019    Years since quitting: 2.2   Smokeless tobacco: Never  Vaping Use   Vaping Use: Never  used  Substance and Sexual Activity   Alcohol use: Not Currently   Drug use: Yes    Types: Marijuana   Sexual activity: Not Currently  Other Topics Concern   Not on file  Social History Narrative   Not on file   Social Determinants of Health   Financial Resource Strain: Not on file  Food Insecurity: Not on file  Transportation Needs: Not on file  Physical Activity: Not on file  Stress: Not on file  Social Connections: Not on file  Intimate Partner Violence: Not on file    Family History  Problem Relation Age of Onset   Healthy Mother    Healthy Father      Current Outpatient Medications:    baclofen (LIORESAL) 10 MG tablet, TAKE 1 TABLET BY MOUTH THREE TIMES A DAY, Disp: 270 tablet, Rfl: 1   docusate sodium (COLACE) 100 MG capsule, Take 100 mg by mouth daily., Disp: , Rfl:    losartan (COZAAR) 25 MG tablet, Take 1 tablet (25 mg total) by mouth daily., Disp: 30 tablet, Rfl: 3   metoprolol succinate (TOPROL XL) 25 MG 24 hr tablet, Take 1 tablet (25 mg total) by mouth daily., Disp: 30 tablet, Rfl: 5   oxyCODONE (OXY IR/ROXICODONE) 5 MG immediate release tablet, Take 1 tablet (5 mg  total) by mouth every 4 (four) hours as needed for moderate pain or severe pain., Disp: 180 tablet, Rfl: 0   pregabalin (LYRICA) 75 MG capsule, Take 1 capsule (75 mg total) by mouth 2 (two) times daily., Disp: 60 capsule, Rfl: 2   metoprolol tartrate (LOPRESSOR) 100 MG tablet, Take 1 tablet (100 mg total) by mouth once for 1 dose. Take 2 hours prior to your CT scan., Disp: 1 tablet, Rfl: 0   prochlorperazine (COMPAZINE) 10 MG tablet, Take 1 tablet (10 mg total) by mouth every 6 (six) hours as needed (Nausea or vomiting). (Patient not taking: Reported on 02/11/2022), Disp: 30 tablet, Rfl: 1 No current facility-administered medications for this visit.  Facility-Administered Medications Ordered in Other Visits:    heparin lock flush 100 unit/mL, 500 Units, Intravenous, Once, Sindy Guadeloupe, MD   heparin  lock flush 100 unit/mL, 500 Units, Intravenous, Once, Sindy Guadeloupe, MD   sodium chloride flush (NS) 0.9 % injection 10 mL, 10 mL, Intravenous, PRN, Sindy Guadeloupe, MD, 10 mL at 05/01/20 0900   sodium chloride flush (NS) 0.9 % injection 10 mL, 10 mL, Intravenous, PRN, Sindy Guadeloupe, MD, 10 mL at 01/29/21 2683  CT CORONARY MORPH W/CTA COR W/SCORE W/CA W/CM &/OR WO/CM  Addendum Date: 01/31/2022   ADDENDUM REPORT: 01/31/2022 13:35 EXAM: OVER-READ INTERPRETATION  CT CHEST The following report is an over-read performed by radiologist Dr. Jason Nest [facility] on 01/31/2022. This over-read does not include interpretation of cardiac or coronary anatomy or pathology. The coronary CTA interpretation by the cardiologist is attached. COMPARISON:  Chest CT 12/03/2021. FINDINGS: Vascular: Visualized thoracic aorta is normal in size. There is a central venous catheter tip terminating in the right atrium. Mediastinum/Nodes: No lymphadenopathy. The distal trachea is unremarkable. Lungs/Pleura: Partially visualized left apical medial consolidation and bronchiectasis likely due to post radiation/post treatment changes with atelectasis. Known oval nodule in the left apex is not visualized within the field of view on this exam. No other acute pulmonary findings within the field of view. Upper Abdomen: No acute abnormality within the field of view. Musculoskeletal: No acute osseous abnormality.No suspicious lytic or blastic lesions. IMPRESSION: Partially visualized left apical medial consolidation, bronchiectasis, and atelectasis likely due to post radiation/post treatment changes. No other acute extracardiac findings within the field of view. Electronically Signed   By: Maurine Simmering M.D.   On: 01/31/2022 13:35   Result Date: 01/31/2022 CLINICAL DATA:  Reduced LVEF, evaluate for CAD EXAM: Cardiac/Coronary  CTA TECHNIQUE: The patient was scanned on a Siemens Somatom go.Top scanner. : A retrospective scan was  triggered in the descending thoracic aorta. Axial non-contrast 3 mm slices were carried out through the heart. The data set was analyzed on a dedicated work station and scored using the Crane. Gantry rotation speed was 330 msecs and collimation was .6 mm. 1108m of metoprolol, 162miv ivabradine and 0.8 mg of sl NTG was given. The 3D data set was reconstructed in 5% intervals of the 60-95 % of the R-R cycle. Diastolic phases were analyzed on a dedicated work station using MPR, MIP and VRT modes. The patient received 75 cc of contrast. FINDINGS: Aorta:  Normal size.  No calcifications.  No dissection. Aortic Valve:  Trileaflet.  No calcifications. Coronary Arteries:  Normal coronary origin.  Right dominance. RCA is a dominant artery that gives rise to PDA and PLA. There is no plaque. Left main is a large artery that gives rise to LAD and LCX  arteries. LAD has no plaque. LCX is a non-dominant artery that gives rise to one OM1 branch. There is no plaque. Other findings: Normal pulmonary vein drainage into the left atrium. Normal left atrial appendage without a thrombus. Normal size of the pulmonary artery. IMPRESSION: 1. Normal coronary calcium score of 0. Patient is low risk for coronary events. 2. Normal coronary origin with right dominance. 3. No evidence of CAD. 4. CAD-RADS 0. Consider non-ischemic causes of cardiomyopathy. Electronically Signed: By: Kate Sable M.D. On: 01/31/2022 12:36   ECHOCARDIOGRAM COMPLETE  Result Date: 02/09/2022    ECHOCARDIOGRAM REPORT   Patient Name:   Kyle Guerrero Date of Exam: 02/09/2022 Medical Rec #:  [id number]         Height:       71.0 in Accession #:    [id number]        Weight:       149.0 lb Date of Birth:  [birth year]         BSA:          1.861 m Patient Age:    51 years          BP:           120/82 mmHg Patient Gender: M                 HR:           79 bpm. Exam Location:  [facility] Procedure: 2D Echo, Color Doppler, Cardiac Doppler and Strain Analysis  Indications:     Chemo Z09  History:         Patient has prior history of Echocardiogram examinations, most                  recent 01/11/2022. Lung cancer.  Sonographer:     Sherrie Sport Referring Phys:  [id number] [facility] C Fernie Grimm Diagnosing Phys: Kate Sable MD  Sonographer Comments: Global longitudinal strain was attempted. IMPRESSIONS  1. Left ventricular ejection fraction, by estimation, is 45 to 50%. The left ventricle has mildly decreased function. The left ventricle demonstrates global hypokinesis. Left ventricular diastolic parameters were normal.  2. Right ventricular systolic function is low normal. The right ventricular size is normal.  3. The mitral valve is normal in structure. Mild mitral valve regurgitation.  4. The aortic valve is tricuspid. Aortic valve regurgitation is not visualized.  5. The inferior vena cava is normal in size with greater than 50% respiratory variability, suggesting right atrial pressure of 3 mmHg. FINDINGS  Left Ventricle: Left ventricular ejection fraction, by estimation, is 45 to 50%. The left ventricle has mildly decreased function. The left ventricle demonstrates global hypokinesis. Global longitudinal strain performed but not reported based on interpreter judgement due to suboptimal tracking. 3D left ventricular ejection fraction analysis performed but not reported based on interpreter judgement due to suboptimal tracking. The left ventricular internal cavity size was normal in size. There is no left ventricular hypertrophy. Left ventricular diastolic parameters were normal. Right Ventricle: The right ventricular size is normal. No increase in right ventricular wall thickness. Right ventricular systolic function is low normal. Left Atrium: Left atrial size was normal in size. Right Atrium: Right atrial size was normal in size. Pericardium: There is no evidence of pericardial effusion. Mitral Valve: The mitral valve is normal in structure. Mild mitral valve regurgitation.  Tricuspid Valve: The tricuspid valve is normal in structure. Tricuspid valve regurgitation is mild. Aortic Valve: The aortic valve is tricuspid. Aortic valve regurgitation is not  visualized. Aortic valve mean gradient measures 3.0 mmHg. Aortic valve peak gradient measures 4.4 mmHg. Aortic valve area, by VTI measures 2.01 cm. Pulmonic Valve: The pulmonic valve was normal in structure. Pulmonic valve regurgitation is not visualized. Aorta: The aortic root is normal in size and structure. Venous: The inferior vena cava is normal in size with greater than 50% respiratory variability, suggesting right atrial pressure of 3 mmHg. IAS/Shunts: No atrial level shunt detected by color flow Doppler.  LEFT VENTRICLE PLAX 2D LVIDd:         5.00 cm   Diastology LVIDs:         3.00 cm   LV e' medial:    10.80 cm/s LV PW:         1.10 cm   LV E/e' medial:  5.2 LV IVS:        0.80 cm   LV e' lateral:   11.70 cm/s LVOT diam:     2.00 cm   LV E/e' lateral: 4.8 LV SV:         36 LV SV Index:   19 LVOT Area:     3.14 cm  RIGHT VENTRICLE RV S prime:     12.00 cm/s TAPSE (M-mode): 1.6 cm LEFT ATRIUM             Index        RIGHT ATRIUM           Index LA diam:        2.30 cm 1.24 cm/m   RA Area:     10.00 cm LA Vol (A2C):   35.4 ml 19.03 ml/m  RA Volume:   20.90 ml  11.23 ml/m LA Vol (A4C):   20.3 ml 10.91 ml/m LA Biplane Vol: 26.5 ml 14.24 ml/m  AORTIC VALVE                    PULMONIC VALVE AV Area (Vmax):    2.11 cm     PV Vmax:        0.57 m/s AV Area (Vmean):   1.93 cm     PV Vmean:       39.150 cm/s AV Area (VTI):     2.01 cm     PV VTI:         0.107 m AV Vmax:           105.00 cm/s  PV Peak grad:   1.3 mmHg AV Vmean:          78.100 cm/s  PV Mean grad:   1.0 mmHg AV VTI:            0.180 m      RVOT Peak grad: 3 mmHg AV Peak Grad:      4.4 mmHg AV Mean Grad:      3.0 mmHg LVOT Vmax:         70.50 cm/s LVOT Vmean:        48.000 cm/s LVOT VTI:          0.115 m LVOT/AV VTI ratio: 0.64  AORTA Ao Root diam: 3.20 cm MITRAL  VALVE MV Area (PHT): 3.95 cm    SHUNTS MV Decel Time: 192 msec    Systemic VTI:  0.12 m MV E velocity: 56.10 cm/s  Systemic Diam: 2.00 cm MV A velocity: 42.40 cm/s  Pulmonic VTI:  0.161 m MV E/A ratio:  1.32 Kate Sable MD Electronically signed by Kate Sable MD Signature Date/Time: 02/09/2022/2:53:27 PM  Final     No images are attached to the encounter.   CMP Latest Ref Rng & Units 01/05/2022  Glucose 70 - 99 mg/dL 85  BUN 6 - 20 mg/dL 16  Creatinine 0.61 - 1.24 mg/dL 1.15  Sodium 135 - 145 mmol/L 134(L)  Potassium 3.5 - 5.1 mmol/L 3.5  Chloride 98 - 111 mmol/L 103  CO2 22 - 32 mmol/L 26  Calcium 8.9 - 10.3 mg/dL 8.9  Total Protein 6.5 - 8.1 g/dL 7.3  Total Bilirubin 0.3 - 1.2 mg/dL 0.6  Alkaline Phos 38 - 126 U/L 81  AST 15 - 41 U/L 15  ALT 0 - 44 U/L 10   CBC Latest Ref Rng & Units 01/05/2022  WBC 4.0 - 10.5 K/uL 5.2  Hemoglobin 13.0 - 17.0 g/dL 12.1(L)  Hematocrit 39.0 - 52.0 % 37.3(L)  Platelets 150 - 400 K/uL 483(H)    Observation/objective: Appears in no acute distress over video visit today.  No overt swelling noted in the left eye.  No limitation in range of motion in the left eye  Assessment and plan: Patient is a 51 year old male with recurrent metastatic adenocarcinoma of the lung stage IV with cervical spine involvement s/p surgery and adjuvant radiation therapy.  He is s/p 4 cycles of Enhertu and we will discuss further management  Patient was noted to have a drop in his EF from 50 to 55% to 40 to 45% in January 2023 with global left ventricular hypokinesis.  Enhertu was held and repeat echocardiogram a month later shows mild improvement in EF to 45 to 50% but continues to have global hypokinesis of the left ventricle.  I did reach out to Dr. Garen Lah who is okay with restarting Enhertu with close monitoring of his echocardiogram.  However given that his ejection fraction has not returned back to baseline I will continue to hold Enhertu at this time.  I  will plan to get a repeat CT chest abdomen and pelvis with contrast in about 2 to 3 weeks time.If there is evidence of disease progression I will plan to switch him from Enhertu to third line chemotherapy.  Headache/left eye pain: We will obtain MRI brain with and without contrast at this time  Follow-up instructions: I will see him back after MRI brain and CT scan results are back  I discussed the assessment and treatment plan with the patient. The patient was provided an opportunity to ask questions and all were answered. The patient agreed with the plan and demonstrated an understanding of the instructions.   The patient was advised to call back or seek an in-person evaluation if the symptoms worsen or if the condition fails to improve as anticipated.  Visit Diagnosis: 1. Malignant neoplasm of lung, unspecified laterality, unspecified part of lung ([place])   2. Headache around the eyes   3. Left eye pain     Dr. Randa Evens, MD, MPH [facility] Tel- [phone] 02/11/2022 11:43 AM

## 2022-02-12 ENCOUNTER — Encounter: Payer: Self-pay | Admitting: Oncology

## 2022-02-14 ENCOUNTER — Inpatient Hospital Stay: Payer: 59

## 2022-02-16 ENCOUNTER — Ambulatory Visit
Admission: RE | Admit: 2022-02-16 | Discharge: 2022-02-16 | Disposition: A | Discharge status: Home / Self Care | Payer: 59 | Source: Ambulatory Visit | Attending: Oncology | Admitting: Oncology

## 2022-02-16 ENCOUNTER — Other Ambulatory Visit: Payer: Self-pay

## 2022-02-16 DIAGNOSIS — R519 Headache, unspecified: Secondary | ICD-10-CM | POA: Insufficient documentation

## 2022-02-16 DIAGNOSIS — C3490 Malignant neoplasm of unspecified part of unspecified bronchus or lung: Secondary | ICD-10-CM | POA: Diagnosis not present

## 2022-02-16 DIAGNOSIS — H5712 Ocular pain, left eye: Secondary | ICD-10-CM | POA: Diagnosis not present

## 2022-02-16 MED ORDER — GADOBUTROL 1 MMOL/ML IV SOLN
6.0000 mL | Freq: Once | INTRAVENOUS | Status: AC | PRN
  Administered 2022-02-16: 6 mL via INTRAVENOUS

## 2022-02-22 DIAGNOSIS — M6281 Muscle weakness (generalized): Secondary | ICD-10-CM | POA: Diagnosis not present

## 2022-02-24 ENCOUNTER — Ambulatory Visit (INDEPENDENT_AMBULATORY_CARE_PROVIDER_SITE_OTHER): Payer: 59 | Admitting: Cardiology

## 2022-02-24 ENCOUNTER — Other Ambulatory Visit: Payer: Self-pay

## 2022-02-24 ENCOUNTER — Encounter: Payer: Self-pay | Admitting: Cardiology

## 2022-02-24 VITALS — BP 110/70 | HR 84 | Ht 71.0 in | Wt 146.0 lb

## 2022-02-24 DIAGNOSIS — I5020 Unspecified systolic (congestive) heart failure: Secondary | ICD-10-CM

## 2022-02-24 DIAGNOSIS — C3490 Malignant neoplasm of unspecified part of unspecified bronchus or lung: Secondary | ICD-10-CM

## 2022-02-24 NOTE — Progress Notes (Signed)
Cardiology Office Note:    Date:  02/24/2022   ID:  Kyle Guerrero, DOB 1971-05-10, MRN [id number]  PCP:  Kathyrn Lass   [facility] Providers Cardiologist:  Kate Sable, MD     Referring MD: No ref. provider found   Chief Complaint  Patient presents with   Other    1 month follow up -- Meds reviewed verbally with patient.     History of Present Illness:    Kyle Guerrero is a 51 y.o. male with a hx of NICM, non-small cell lung cancer stage IV on chemo, former smoker x20+ years who presents for follow-up.    Being seen due to nonischemic cardiomyopathy secondary to chemotherapy.  EF as low as 40 to 45%.  Chemotherapy was held, repeat echocardiogram obtained 02/09/2022 showed improvement in EF to now 45 to 50%.  Toprol-XL, losartan was started for cardioprotection and cardiomyopathy.  Coronary CTA was obtained, no evidence of CAD noted.  States blood pressure has been better ever since starting Toprol-XL and losartan.  Feels well, tolerating medications as prescribed.  Saw spine specialist, and nerve transfer/transplant is being planned to help with weakness in his left arm.   Prior notes Echo 01/2021 EF 45 to 50% Coronary CTA 01/2022, calcium score 0, no evidence of CAD Echocardiogram dated 09/2021 showed EF 55 to 60%.   Repeat echo 01/11/2022 Showed reduction in EF to 40 to 50%.  Chemotherapy was held after echo showed reduced EF  Chart review indicated patient underwent CarboTaxol chemotherapy followed by CarboTaxol and Keytruda.  Was on Keytruda maintenance.    Past Medical History:  Diagnosis Date   Asthma    Lung cancer ([place])     Past Surgical History:  Procedure Laterality Date   CYST EXCISION     IR IMAGING GUIDED PORT INSERTION  12/05/2019   VIDEO BRONCHOSCOPY WITH ENDOBRONCHIAL ULTRASOUND Left 11/22/2019   Procedure: VIDEO BRONCHOSCOPY WITH ENDOBRONCHIAL ULTRASOUND;  Surgeon: Tyler Pita, MD;  Location: [facility] ORS;  Service: Thoracic;  Laterality:  Left;    Current Medications: Current Meds  Medication Sig   baclofen (LIORESAL) 10 MG tablet TAKE 1 TABLET BY MOUTH THREE TIMES A DAY   docusate sodium (COLACE) 100 MG capsule Take 100 mg by mouth daily.   losartan (COZAAR) 25 MG tablet Take 1 tablet (25 mg total) by mouth daily.   metoprolol succinate (TOPROL XL) 25 MG 24 hr tablet Take 1 tablet (25 mg total) by mouth daily.   oxyCODONE (OXY IR/ROXICODONE) 5 MG immediate release tablet Take 1 tablet (5 mg total) by mouth every 4 (four) hours as needed for moderate pain or severe pain.   pregabalin (LYRICA) 75 MG capsule Take 1 capsule (75 mg total) by mouth 2 (two) times daily.   prochlorperazine (COMPAZINE) 10 MG tablet Take 1 tablet (10 mg total) by mouth every 6 (six) hours as needed (Nausea or vomiting).     Allergies:   Patient has no known allergies.   Social History   Socioeconomic History   Marital status: Single    Spouse name: Not on file   Number of children: Not on file   Years of education: Not on file   Highest education level: Not on file  Occupational History   Not on file  Tobacco Use   Smoking status: Former    Packs/day: 2.00    Types: Cigarettes    Quit date: 11/08/2019    Years since quitting: 2.2   Smokeless tobacco: Never  Vaping Use   Vaping Use: Never used  Substance and Sexual Activity   Alcohol use: Not Currently   Drug use: Yes    Types: Marijuana   Sexual activity: Not Currently  Other Topics Concern   Not on file  Social History Narrative   Not on file   Social Determinants of Health   Financial Resource Strain: Not on file  Food Insecurity: Not on file  Transportation Needs: Not on file  Physical Activity: Not on file  Stress: Not on file  Social Connections: Not on file     Family History: The patient's family history includes Healthy in his father and mother.  ROS:   Please see the history of present illness.     All other systems reviewed and are  negative.  EKGs/Labs/Other Studies Reviewed:    The following studies were reviewed today:   EKG:  EKG not ordered today.    Recent Labs: 06/04/2021: TSH 1.273 01/05/2022: ALT 10; BUN 16; Creatinine, Ser 1.15; Hemoglobin 12.1; Platelets 483; Potassium 3.5; Sodium 134  Recent Lipid Panel No results found for: CHOL, TRIG, HDL, CHOLHDL, VLDL, LDLCALC, LDLDIRECT   Risk Assessment/Calculations:          Physical Exam:    VS:  BP 110/70 (BP Location: Left Arm, Patient Position: Sitting, Cuff Size: Normal)    Pulse 84    Ht 5\' 11"  (1.803 m)    Wt 146 lb (66.2 kg)    SpO2 99%    BMI 20.36 kg/m     Wt Readings from Last 3 Encounters:  02/24/22 146 lb (66.2 kg)  01/20/22 149 lb (67.6 kg)  01/05/22 150 lb (68 kg)     GEN:  Well nourished, well developed in no acute distress HEENT: Normal NECK: No JVD; No carotid bruits CARDIAC: RRR, no murmurs, rubs, gallops RESPIRATORY:  Clear to auscultation without rales, wheezing or rhonchi  ABDOMEN: Soft, non-tender, non-distended MUSCULOSKELETAL:  No edema; left arm weakness noted SKIN: Warm and dry NEUROLOGIC:  Alert and oriented x 3 PSYCHIATRIC:  Normal affect   ASSESSMENT:    1. HFrEF (heart failure with reduced ejection fraction) ([place])   2. Malignant neoplasm of lung, unspecified laterality, unspecified part of lung ([place])     PLAN:    In order of problems listed above:  NICM, (EF  as low as 40-45% 12/2021). Last EF 01/2022 45-50% after hold chemo. Coronary CTA with calcium score 0, no evidence for CAD.  Etiology of cardiomyopathy is chemotherapy as patient used CarboTaxol which is known to be cardiotoxic and Keytruda (although rare reported at about 1%). Chemo being held with improvement in EF. cont cardioprotective meds-Toprol-XL 25 mg daily, losartan 25 mg daily.  Plan to repeat echo in 2 months. Baseline EF 55-60%. Alternative chemo agents being considered as per oncology. Metastatic lung cancer, management as per  oncology.  Follow-up in 2 months after repeat echo       Medication Adjustments/Labs and Tests Ordered: Current medicines are reviewed at length with the patient today.  Concerns regarding medicines are outlined above.  Orders Placed This Encounter  Procedures   ECHOCARDIOGRAM COMPLETE   No orders of the defined types were placed in this encounter.   Patient Instructions  Medication Instructions:  Your physician recommends that you continue on your current medications as directed. Please refer to the Current Medication list given to you today.  *If you need a refill on your cardiac medications before your next appointment, please call your pharmacy*  Lab Work: None ordered If you have labs (blood work) drawn today and your tests are completely normal, you will receive your results only by: [place] (if you have MyChart) OR A paper copy in the mail If you have any lab test that is abnormal or we need to change your treatment, we will call you to review the results.   Testing/Procedures:  Your physician has requested that you have an echocardiogram in 2 months. Echocardiography is a painless test that uses sound waves to create images of your heart. It provides your doctor with information about the size and shape of your heart and how well your hearts chambers and valves are working. This procedure takes approximately one hour. There are no restrictions for this procedure.    Follow-Up: At [facility], you and your health needs are our priority.  As part of our continuing mission to provide you with exceptional heart care, we have created designated Provider Care Teams.  These Care Teams include your primary Cardiologist (physician) and Advanced Practice Providers (APPs -  Physician Assistants and Nurse Practitioners) who all work together to provide you with the care you need, when you need it.  We recommend signing up for the patient portal called "MyChart".  Sign  up information is provided on this After Visit Summary.  MyChart is used to connect with patients for Virtual Visits (Telemedicine).  Patients are able to view lab/test results, encounter notes, upcoming appointments, etc.  Non-urgent messages can be sent to your provider as well.   To learn more about what you can do with MyChart, go to [email or web address].    Your next appointment:   2 month(s) after Echo  The format for your next appointment:   In Person  Provider:   You may see Kate Sable, MD or one of the following Advanced Practice Providers on your designated Care Team:   Murray Hodgkins, NP Christell Faith, PA-C Cadence Kathlen Mody, [place]    Other Instructions     Signed, Kate Sable, MD  02/24/2022 12:26 PM    [place]

## 2022-02-24 NOTE — Patient Instructions (Signed)
Medication Instructions:  ?Your physician recommends that you continue on your current medications as directed. Please refer to the Current Medication list given to you today. ? ?*If you need a refill on your cardiac medications before your next appointment, please call your pharmacy* ? ? ?Lab Work: ?None ordered ?If you have labs (blood work) drawn today and your tests are completely normal, you will receive your results only by: ?MyChart Message (if you have MyChart) OR ?A paper copy in the mail ?If you have any lab test that is abnormal or we need to change your treatment, we will call you to review the results. ? ? ?Testing/Procedures: ? ?Your physician has requested that you have an echocardiogram in 2 months. Echocardiography is a painless test that uses sound waves to create images of your heart. It provides your doctor with information about the size and shape of your heart and how well your heart?s chambers and valves are working. This procedure takes approximately one hour. There are no restrictions for this procedure. ? ? ? ?Follow-Up: ?At [facility], you and your health needs are our priority.  As part of our continuing mission to provide you with exceptional heart care, we have created designated Provider Care Teams.  These Care Teams include your primary Cardiologist (physician) and Advanced Practice Providers (APPs -  Physician Assistants and Nurse Practitioners) who all work together to provide you with the care you need, when you need it. ? ?We recommend signing up for the patient portal called "MyChart".  Sign up information is provided on this After Visit Summary.  MyChart is used to connect with patients for Virtual Visits (Telemedicine).  Patients are able to view lab/test results, encounter notes, upcoming appointments, etc.  Non-urgent messages can be sent to your provider as well.   ?To learn more about what you can do with MyChart, go to [email or web address].   ? ?Your next  appointment:   ?2 month(s) after Echo ? ?The format for your next appointment:   ?In Person ? ?Provider:   ?You may see Kate Sable, MD or one of the following Advanced Practice Providers on your designated Care Team:   ?Murray Hodgkins, NP ?Christell Faith, PA-C ?Cadence Kathlen Mody, PA-C  ? ? ?Other Instructions ? ? ?

## 2022-02-28 DIAGNOSIS — R29898 Other symptoms and signs involving the musculoskeletal system: Secondary | ICD-10-CM | POA: Diagnosis not present

## 2022-02-28 DIAGNOSIS — M542 Cervicalgia: Secondary | ICD-10-CM | POA: Diagnosis not present

## 2022-03-01 ENCOUNTER — Telehealth: Payer: Self-pay | Admitting: *Deleted

## 2022-03-01 ENCOUNTER — Ambulatory Visit
Admission: RE | Admit: 2022-03-01 | Discharge: 2022-03-01 | Disposition: A | Discharge status: Home / Self Care | Payer: 59 | Source: Ambulatory Visit | Attending: Oncology | Admitting: Oncology

## 2022-03-01 ENCOUNTER — Other Ambulatory Visit: Payer: Self-pay

## 2022-03-01 DIAGNOSIS — C3490 Malignant neoplasm of unspecified part of unspecified bronchus or lung: Secondary | ICD-10-CM | POA: Insufficient documentation

## 2022-03-01 DIAGNOSIS — H5712 Ocular pain, left eye: Secondary | ICD-10-CM | POA: Diagnosis not present

## 2022-03-01 DIAGNOSIS — Z5111 Encounter for antineoplastic chemotherapy: Secondary | ICD-10-CM | POA: Diagnosis not present

## 2022-03-01 DIAGNOSIS — R519 Headache, unspecified: Secondary | ICD-10-CM | POA: Diagnosis not present

## 2022-03-01 DIAGNOSIS — R918 Other nonspecific abnormal finding of lung field: Secondary | ICD-10-CM | POA: Diagnosis not present

## 2022-03-01 IMAGING — CT CT CHEST-ABD-PELV W/ CM
2 of 5 series · 9 of 36 positions shown, 15 images · IV contrast (agent unspecified)
Comparison: CT [DATE]

CLINICAL DATA: Lung cancer with metastasis. Chemotherapy complete
[DATE]. * onc *

EXAM:
CT CHEST, ABDOMEN, AND PELVIS WITH CONTRAST
TECHNIQUE: Multidetector CT imaging of the chest, abdomen and pelvis was
performed following the standard protocol during bolus
administration of intravenous contrast.

[Series 2: axials cap 5.00 · axial · 0.73mm/px · z∈[-1601,-1146]mm · 6 of 129 slices shown, 11 images]
[im 19/129  mediastinal]
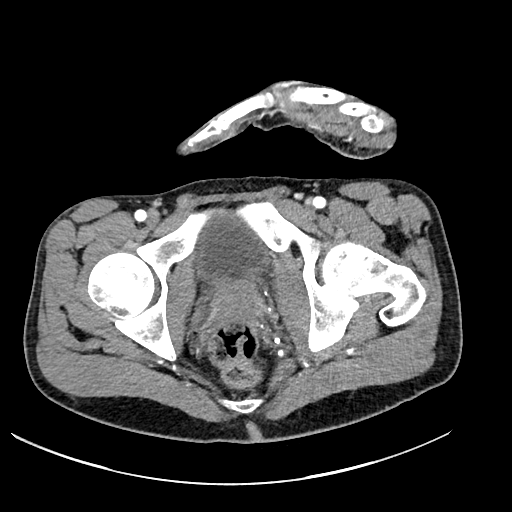
[im 19/129  bone]
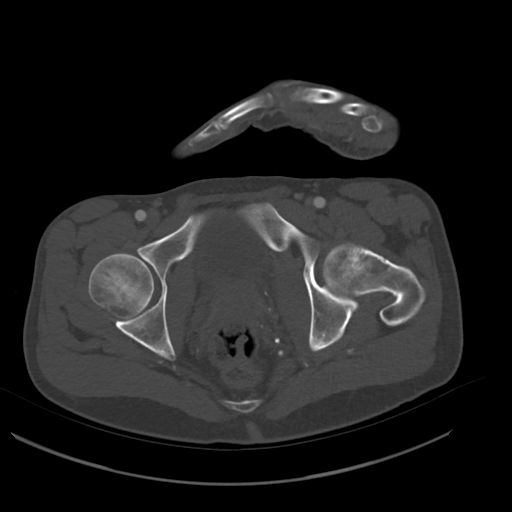
[im 37/129  mediastinal]
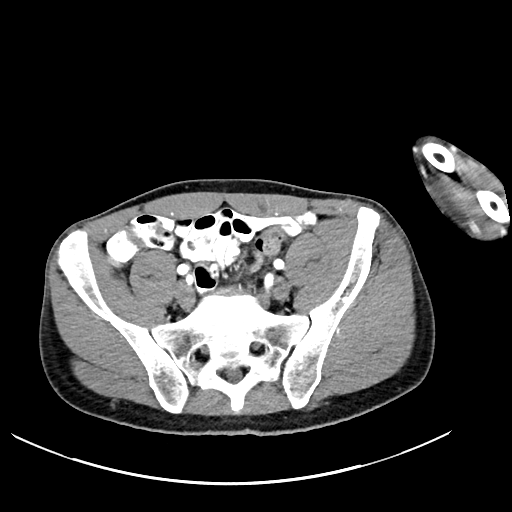
[im 55/129  mediastinal]
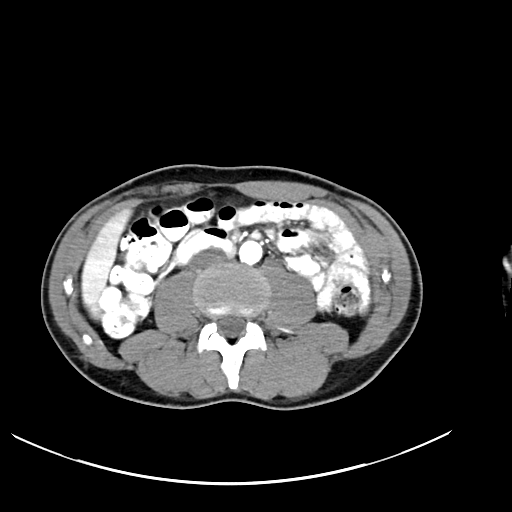
[im 55/129  lung]
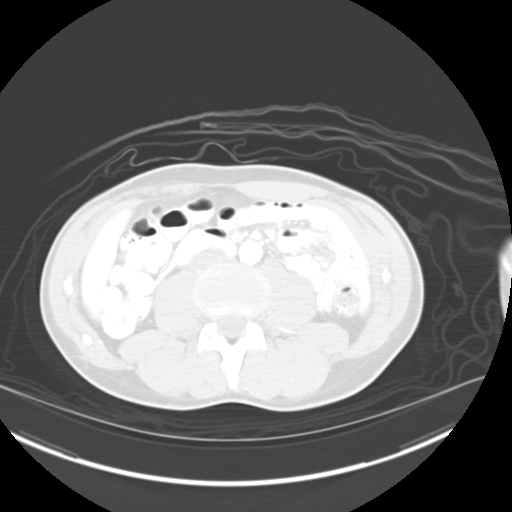
[im 74/129  mediastinal]
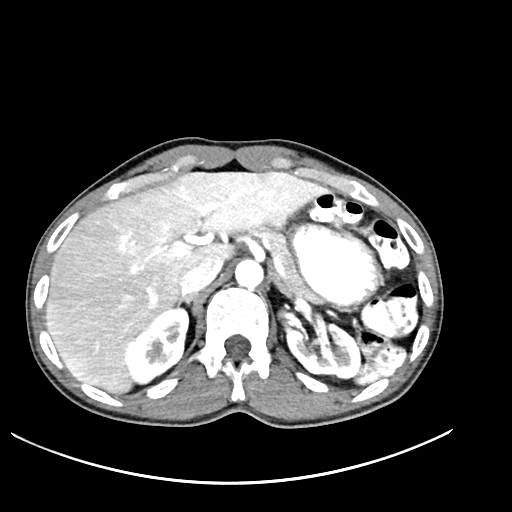
[im 74/129  lung]
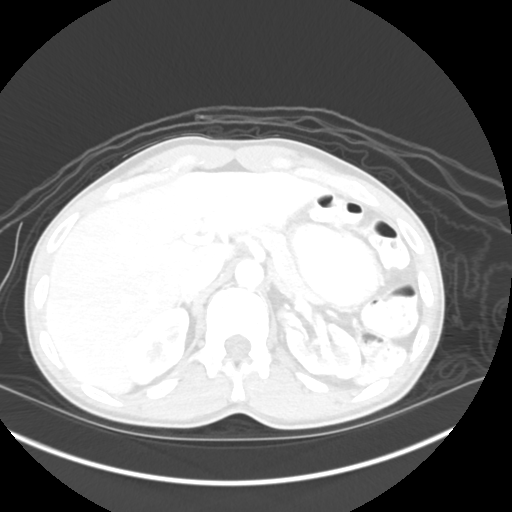
[im 92/129  mediastinal]
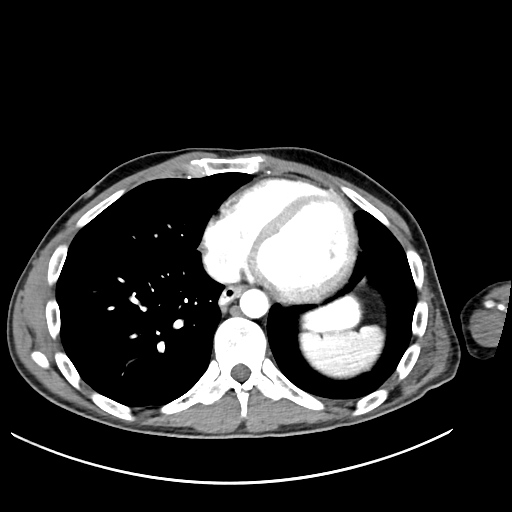
[im 92/129  lung]
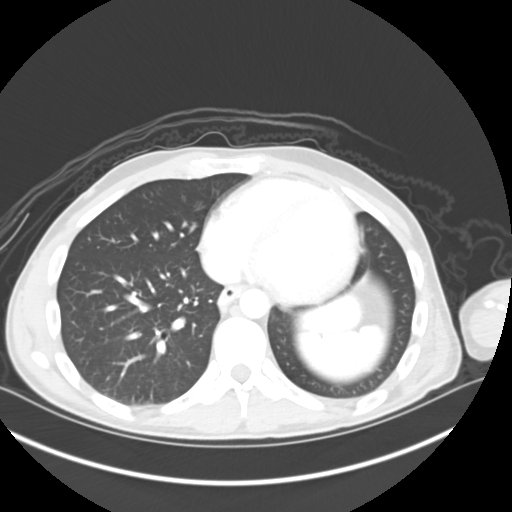
[im 110/129  mediastinal]
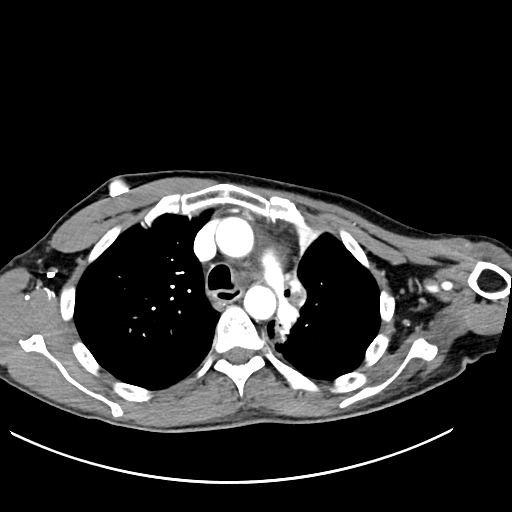
[im 110/129  lung]
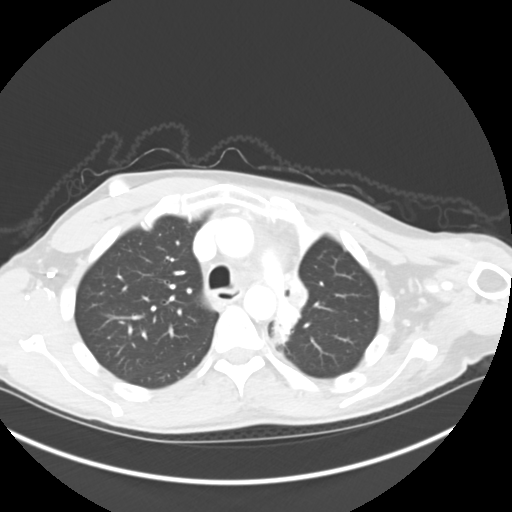

[Series 4: coronals cap 2.00 cor · coronal · 0.73mm/px · 3 of 131 slices shown, 4 images]
[im 27/131  mediastinal]
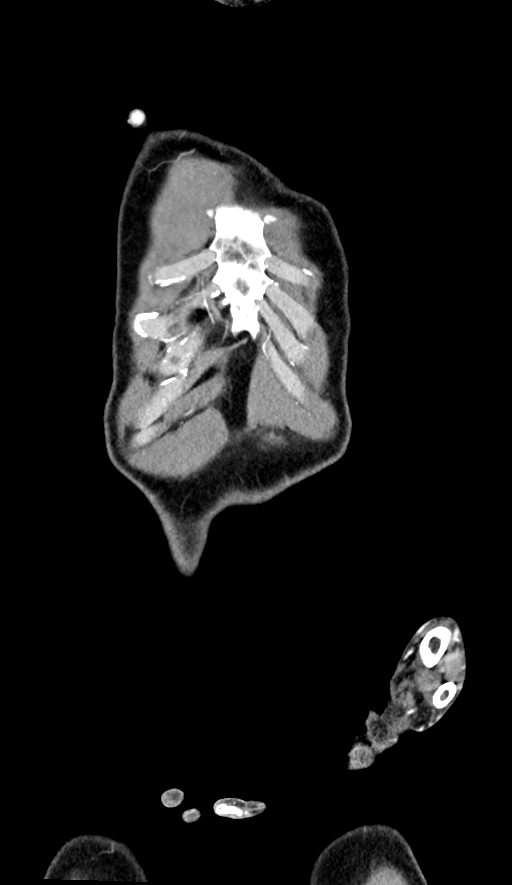
[im 53/131  mediastinal]
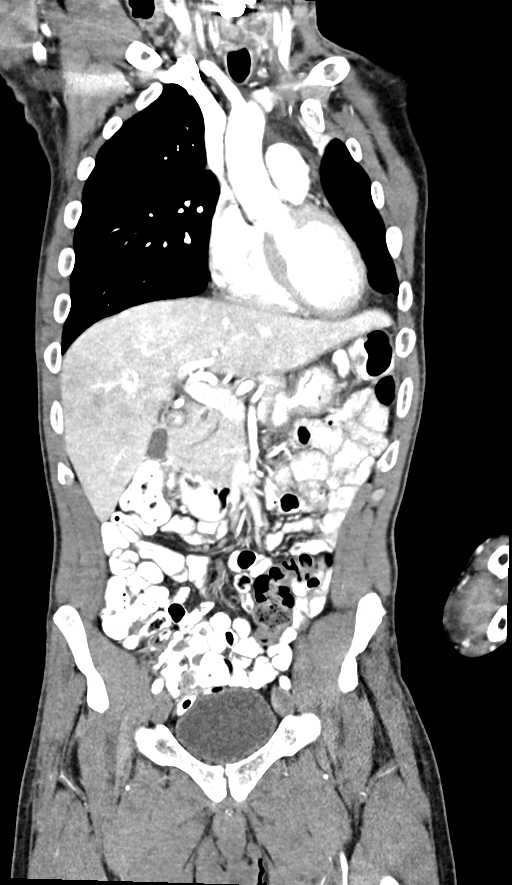
[im 53/131  bone]
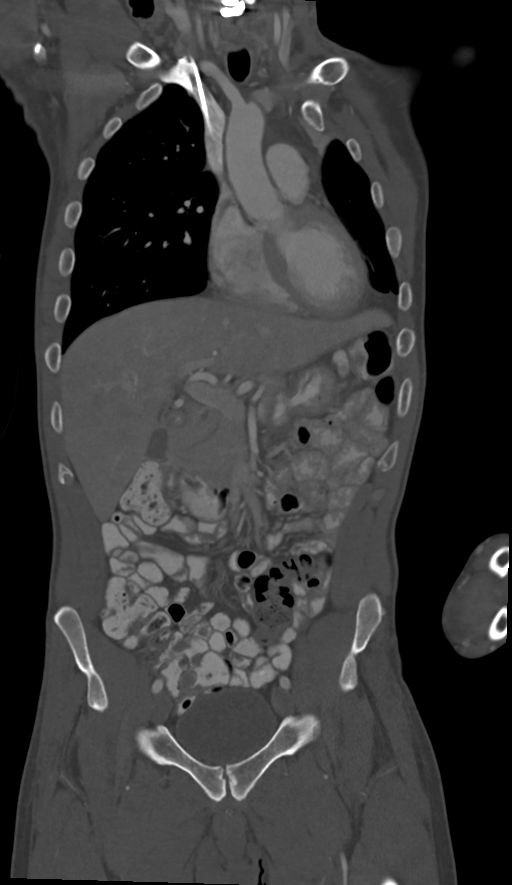
[im 79/131  mediastinal]
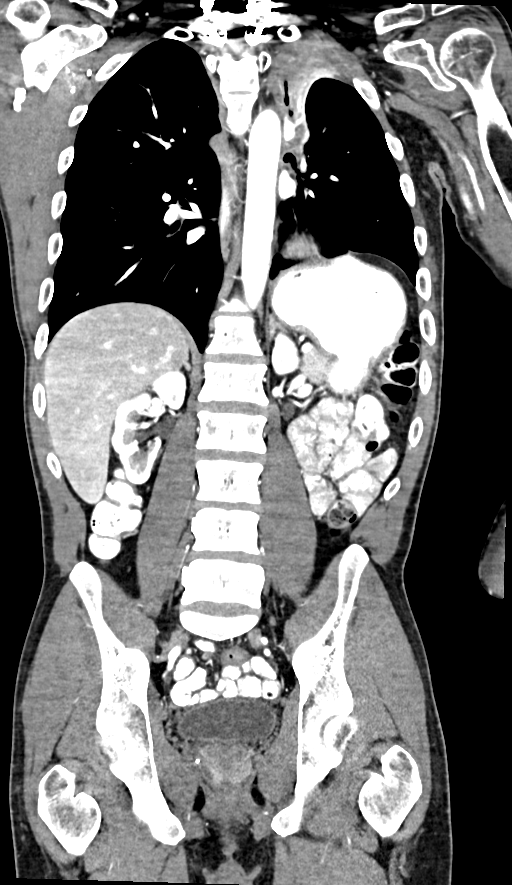

[9 of 36 positions shown; findings below may reference images not displayed]

RADIATION DOSE REDUCTION: This exam was performed according to the
departmental dose-optimization program which includes automated
exposure control, adjustment of the mA and/or kV according to
patient size and/or use of iterative reconstruction technique.

CONTRAST:  100mL OMNIPAQUE IOHEXOL 300 MG/ML  SOLN
FINDINGS: CT CHEST FINDINGS

Cardiovascular: Port in the anterior chest wall with tip in distal
SVC.

Mediastinum/Nodes: There is new enhancing nodularity in the upper
mediastinum/thoracic inlet. For example 16 mm enhancing nodule just
LEFT of midline adjacent esophagus measuring 16 mm (image [DATE]).
Smaller round enhancing lesion deep to the RIGHT thyroid gland
measuring 5 mm on image 5/series 2. These peripheral enhancing nodes
are seen well on coronal image 55/4).

No lower mediastinal adenopathy.  No hilar adenopathy.

Lungs/Pleura:

Increased consolidation at the LEFT lung apex with decreased
nodularity. On coronal imaging there is a small enhancing nodule at
the most superior apex measuring 7 mm (image 68/4)

RIGHT lung is mildly hyperexpanded. No nodularity.

Musculoskeletal: No aggressive osseous lesion.

CT ABDOMEN AND PELVIS FINDINGS

Hepatobiliary: No focal hepatic lesion. No biliary ductal
dilatation. Gallbladder is normal. Common bile duct is normal.

Pancreas: Pancreas is normal. No ductal dilatation. No pancreatic
inflammation.

Spleen: Normal spleen

Adrenals/urinary tract: Adrenal glands and kidneys are normal. The
ureters and bladder normal.

Stomach/Bowel: Stomach, small bowel, appendix, and cecum are normal.
The colon and rectosigmoid colon are normal.

Vascular/Lymphatic: Abdominal aorta is normal caliber. There is no
retroperitoneal or periportal lymphadenopathy. No pelvic
lymphadenopathy.

Reproductive: Prostate unremarkable

Other: No peritoneal metastasis

Musculoskeletal: Multiple small subcentimeter calcifications in the
iliac bones unchanged from prior.
IMPRESSION: Chest Impression:

1. Concern for metastatic nodal recurrence within the anterior
central neck with enhancing nodes adjacent to the thyroid gland.
Recommend FDG PET scan for further characterization.
2. Increased consolidation in the LEFT upper lobe with differential
including tumor progression versus radiation change progression.
Recommend PET-CT as above.

Abdomen / Pelvis Impression:

1.  No evidence of metastatic disease in the abdomen pelvis.

2.   No evidence skeletal metastasis.

## 2022-03-01 MED ORDER — IOHEXOL 300 MG/ML  SOLN
100.0000 mL | Freq: Once | INTRAMUSCULAR | Status: AC | PRN
  Administered 2022-03-01: 100 mL via INTRAVENOUS

## 2022-03-01 NOTE — Telephone Encounter (Signed)
Called report ? ?IMPRESSION: ?Chest Impression: ?  ?1. Concern for metastatic nodal recurrence within the anterior ?central neck with enhancing nodes adjacent to the thyroid gland. ?Recommend FDG PET scan for further characterization. ?2. Increased consolidation in the LEFT upper lobe with differential ?including tumor progression versus radiation change progression. ?Recommend PET-CT as above. ?  ?Abdomen / Pelvis Impression: ?  ?1.  No evidence of metastatic disease in the abdomen pelvis. ?  ?2.   No evidence skeletal metastasis. ?  ?  ?Electronically Signed ?  By: Suzy Bouchard M.D. ?  On: 03/01/2022 15:19 ?  ?

## 2022-03-02 ENCOUNTER — Encounter: Payer: Self-pay | Admitting: Oncology

## 2022-03-04 ENCOUNTER — Other Ambulatory Visit: Payer: Self-pay

## 2022-03-04 ENCOUNTER — Inpatient Hospital Stay: Payer: 59 | Attending: Oncology

## 2022-03-04 ENCOUNTER — Encounter: Payer: Self-pay | Admitting: Oncology

## 2022-03-04 ENCOUNTER — Inpatient Hospital Stay (HOSPITAL_BASED_OUTPATIENT_CLINIC_OR_DEPARTMENT_OTHER): Payer: 59 | Admitting: Oncology

## 2022-03-04 VITALS — BP 126/93 | HR 96 | Temp 96.4°F | Resp 16 | Wt 146.8 lb

## 2022-03-04 DIAGNOSIS — Z95828 Presence of other vascular implants and grafts: Secondary | ICD-10-CM

## 2022-03-04 DIAGNOSIS — C782 Secondary malignant neoplasm of pleura: Secondary | ICD-10-CM | POA: Diagnosis not present

## 2022-03-04 DIAGNOSIS — C3412 Malignant neoplasm of upper lobe, left bronchus or lung: Secondary | ICD-10-CM

## 2022-03-04 DIAGNOSIS — Z7189 Other specified counseling: Secondary | ICD-10-CM | POA: Diagnosis not present

## 2022-03-04 LAB — COMPREHENSIVE METABOLIC PANEL
ALT: 14 U/L (ref 0–44)
AST: 14 U/L — ABNORMAL LOW (ref 15–41)
Albumin: 3.9 g/dL (ref 3.5–5.0)
Alkaline Phosphatase: 96 U/L (ref 38–126)
Anion gap: 9 (ref 5–15)
BUN: 13 mg/dL (ref 6–20)
CO2: 26 mmol/L (ref 22–32)
Calcium: 9.1 mg/dL (ref 8.9–10.3)
Chloride: 98 mmol/L (ref 98–111)
Creatinine, Ser: 1.05 mg/dL (ref 0.61–1.24)
GFR, Estimated: >60 mL/min (ref >60)
Glucose, Bld: 100 mg/dL — ABNORMAL HIGH (ref 70–99)
Potassium: 3.5 mmol/L (ref 3.5–5.1)
Sodium: 133 mmol/L — ABNORMAL LOW (ref 135–145)
Total Bilirubin: 0.5 mg/dL (ref 0.3–1.2)
Total Protein: 7.9 g/dL (ref 6.5–8.1)

## 2022-03-04 LAB — CBC WITH DIFFERENTIAL/PLATELET
Abs Immature Granulocytes: 0.01 10*3/uL (ref 0.00–0.07)
Basophils Absolute: 0 10*3/uL (ref 0.0–0.1)
Basophils Relative: 1 %
Eosinophils Absolute: 0.2 10*3/uL (ref 0.0–0.5)
Eosinophils Relative: 5 %
HCT: 39.3 % (ref 39.0–52.0)
Hemoglobin: 12.6 g/dL — ABNORMAL LOW (ref 13.0–17.0)
Immature Granulocytes: 0 %
Lymphocytes Relative: 33 %
Lymphs Abs: 1.5 10*3/uL (ref 0.7–4.0)
MCH: 28.8 pg (ref 26.0–34.0)
MCHC: 32.1 g/dL (ref 30.0–36.0)
MCV: 89.9 fL (ref 80.0–100.0)
Monocytes Absolute: 0.4 10*3/uL (ref 0.1–1.0)
Monocytes Relative: 10 %
Neutro Abs: 2.4 10*3/uL (ref 1.7–7.7)
Neutrophils Relative %: 51 %
Platelets: 411 10*3/uL — ABNORMAL HIGH (ref 150–400)
RBC: 4.37 MIL/uL (ref 4.22–5.81)
RDW: 13.9 % (ref 11.5–15.5)
WBC: 4.7 10*3/uL (ref 4.0–10.5)
nRBC: 0 % (ref 0.0–0.2)

## 2022-03-04 MED ORDER — HEPARIN SOD (PORK) LOCK FLUSH 100 UNIT/ML IV SOLN
500.0000 [IU] | Freq: Once | INTRAVENOUS | Status: AC
  Administered 2022-03-04: 500 [IU] via INTRAVENOUS
  Filled 2022-03-04: qty 5

## 2022-03-04 MED ORDER — SODIUM CHLORIDE 0.9% FLUSH
10.0000 mL | Freq: Once | INTRAVENOUS | Status: AC
  Administered 2022-03-04: 10 mL via INTRAVENOUS
  Filled 2022-03-04: qty 10

## 2022-03-04 NOTE — Progress Notes (Signed)
Pt states that for the past 2-3 weeks his rt arm feels like he is dealing w/ arthritis and the bone in the back of his neck is painful. States he is having a nerve transplant at the end of the month.  ?

## 2022-03-04 NOTE — Progress Notes (Unsigned)
Hematology/Oncology Consult note [facility]  [phone] [phone]  Patient Care Team: Pcp, No as PCP - General Kate Sable, MD as PCP - Cardiology (Cardiology) Telford Nab, RN as Registered Nurse Sindy Guadeloupe, MD as Consulting Physician (Hematology and Oncology) Sindy Guadeloupe, MD as Consulting Physician (Hematology and Oncology)   Name of the patient: Kyle Guerrero  [id number]  December 20, 1971   Date of visit: 03/04/22  Diagnosis- ***  Chief complaint/ Reason for visit- ***  Heme/Onc history: ***  Interval history- ***  ECOG PS- *** Pain scale- *** Opioid associated constipation- ***  Review of systems- ROS   Current treatment- ***  No Known Allergies   Past Medical History:  Diagnosis Date   Asthma    Lung cancer ([place])      Past Surgical History:  Procedure Laterality Date   CYST EXCISION     IR IMAGING GUIDED PORT INSERTION  12/05/2019   VIDEO BRONCHOSCOPY WITH ENDOBRONCHIAL ULTRASOUND Left 11/22/2019   Procedure: VIDEO BRONCHOSCOPY WITH ENDOBRONCHIAL ULTRASOUND;  Surgeon: Tyler Pita, MD;  Location: [facility] ORS;  Service: Thoracic;  Laterality: Left;    Social History   Socioeconomic History   Marital status: Single    Spouse name: Not on file   Number of children: Not on file   Years of education: Not on file   Highest education level: Not on file  Occupational History   Not on file  Tobacco Use   Smoking status: Former    Packs/day: 2.00    Types: Cigarettes    Quit date: 11/08/2019    Years since quitting: 2.3   Smokeless tobacco: Never  Vaping Use   Vaping Use: Never used  Substance and Sexual Activity   Alcohol use: Not Currently   Drug use: Yes    Types: Marijuana   Sexual activity: Not Currently  Other Topics Concern   Not on file  Social History Narrative   Not on file   Social Determinants of Health   Financial Resource Strain: Not on file   Food Insecurity: Not on file  Transportation Needs: Not on file  Physical Activity: Not on file  Stress: Not on file  Social Connections: Not on file  Intimate Partner Violence: Not on file    Family History  Problem Relation Age of Onset   Healthy Mother    Healthy Father      Current Outpatient Medications:    baclofen (LIORESAL) 10 MG tablet, TAKE 1 TABLET BY MOUTH THREE TIMES A DAY, Disp: 270 tablet, Rfl: 1   docusate sodium (COLACE) 100 MG capsule, Take 100 mg by mouth daily., Disp: , Rfl:    losartan (COZAAR) 25 MG tablet, Take 1 tablet (25 mg total) by mouth daily., Disp: 30 tablet, Rfl: 3   metoprolol succinate (TOPROL XL) 25 MG 24 hr tablet, Take 1 tablet (25 mg total) by mouth daily., Disp: 30 tablet, Rfl: 5   pregabalin (LYRICA) 75 MG capsule, Take 1 capsule (75 mg total) by mouth 2 (two) times daily., Disp: 60 capsule, Rfl: 2   prochlorperazine (COMPAZINE) 10 MG tablet, Take 1 tablet (10 mg total) by mouth every 6 (six) hours as needed (Nausea or vomiting)., Disp: 30 tablet, Rfl: 1   oxyCODONE (OXY IR/ROXICODONE) 5 MG immediate release tablet, Take 1 tablet (5 mg total) by mouth every 4 (four) hours as needed for moderate pain or severe pain. (Patient not taking: Reported on 03/04/2022), Disp: 180 tablet, Rfl: 0  No current facility-administered medications for this visit.  Facility-Administered Medications Ordered in Other Visits:    heparin lock flush 100 unit/mL, 500 Units, Intravenous, Once, Sindy Guadeloupe, MD   heparin lock flush 100 unit/mL, 500 Units, Intravenous, Once, Sindy Guadeloupe, MD   sodium chloride flush (NS) 0.9 % injection 10 mL, 10 mL, Intravenous, PRN, Sindy Guadeloupe, MD, 10 mL at 05/01/20 0900   sodium chloride flush (NS) 0.9 % injection 10 mL, 10 mL, Intravenous, PRN, Sindy Guadeloupe, MD, 10 mL at 01/29/21 0835  Physical exam:  Vitals:   03/04/22 1024  BP: (!) 126/93  Pulse: 96  Resp: 16  Temp: (!) 96.4 F (35.8 C)  SpO2: 99%   Weight: 146 lb 12.8 oz (66.6 kg)   Physical Exam   CMP Latest Ref Rng & Units 03/04/2022  Glucose 70 - 99 mg/dL 100(H)  BUN 6 - 20 mg/dL 13  Creatinine 0.61 - 1.24 mg/dL 1.05  Sodium 135 - 145 mmol/L 133(L)  Potassium 3.5 - 5.1 mmol/L 3.5  Chloride 98 - 111 mmol/L 98  CO2 22 - 32 mmol/L 26  Calcium 8.9 - 10.3 mg/dL 9.1  Total Protein 6.5 - 8.1 g/dL 7.9  Total Bilirubin 0.3 - 1.2 mg/dL 0.5  Alkaline Phos 38 - 126 U/L 96  AST 15 - 41 U/L 14(L)  ALT 0 - 44 U/L 14   CBC Latest Ref Rng & Units 03/04/2022  WBC 4.0 - 10.5 K/uL 4.7  Hemoglobin 13.0 - 17.0 g/dL 12.6(L)  Hematocrit 39.0 - 52.0 % 39.3  Platelets 150 - 400 K/uL 411(H)    No images are attached to the encounter.  MR Brain W Wo Contrast  Result Date: 02/17/2022 CLINICAL DATA:  Headache mainly at left eye, light bothering him on left side EXAM: MRI HEAD WITHOUT AND WITH CONTRAST TECHNIQUE: Multiplanar, multiecho pulse sequences of the brain and surrounding structures were obtained without and with intravenous contrast. CONTRAST:  2mL GADAVIST GADOBUTROL 1 MMOL/ML IV SOLN COMPARISON:  Brain MRI 12/02/2019 FINDINGS: Brain: There is no evidence of acute intracranial hemorrhage, extra-axial fluid collection, or acute infarct. Parenchymal volume is normal. The ventricles are normal in size. Small areas encephalomalacia in the left superior frontal gyrus and left frontal operculum are unchanged, likely reflecting remote infarcts. There are a few small foci of FLAIR signal abnormality in the subcortical and periventricular white matter, nonspecific but possibly due to mild chronic white matter microangiopathy, also not significantly changed. There is no abnormal enhancement. There is no mass lesion. There is no mass effect or midline shift. Vascular: Normal flow voids. Skull and upper cervical spine: There is a nonenhancing focus of T1 and T2 hypointensity in the midline frontal calvarium, unchanged since 2020 and most likely benign. There  is no other marrow signal abnormality. Cervical spine fusion hardware is noted, incompletely imaged. Sinuses/Orbits: The paranasal sinuses are clear. The globes and orbits are unremarkable. Other: None. IMPRESSION: 1. No acute intracranial pathology or evidence of intracranial metastatic disease. 2. Unchanged focus of signal abnormality in the midline frontal calvarium since 2020, likely benign. Electronically Signed   By: Valetta Mole M.D.   On: 02/17/2022 15:30   CT CHEST ABDOMEN PELVIS W CONTRAST  Result Date: 03/01/2022 CLINICAL DATA:  Lung cancer with metastasis. Chemotherapy complete January 2023. * onc * EXAM: CT CHEST, ABDOMEN, AND PELVIS WITH CONTRAST TECHNIQUE: Multidetector CT imaging of the chest, abdomen and pelvis was performed following the standard protocol during bolus administration of intravenous contrast.  RADIATION DOSE REDUCTION: This exam was performed according to the departmental dose-optimization program which includes automated exposure control, adjustment of the mA and/or kV according to patient size and/or use of iterative reconstruction technique. CONTRAST:  168mL OMNIPAQUE IOHEXOL 300 MG/ML  SOLN COMPARISON:  CT 12/03/2021 FINDINGS: CT CHEST FINDINGS Cardiovascular: Port in the anterior chest wall with tip in distal SVC. Mediastinum/Nodes: There is new enhancing nodularity in the upper mediastinum/thoracic inlet. For example 16 mm enhancing nodule just LEFT of midline adjacent esophagus measuring 16 mm (image 7/2). Smaller round enhancing lesion deep to the RIGHT thyroid gland measuring 5 mm on image 5/series 2. These peripheral enhancing nodes are seen well on coronal image 55/4). No lower mediastinal adenopathy.  No hilar adenopathy. Lungs/Pleura: Increased consolidation at the LEFT lung apex with decreased nodularity. On coronal imaging there is a small enhancing nodule at the most superior apex measuring 7 mm (image 68/4) RIGHT lung is mildly hyperexpanded. No nodularity.  Musculoskeletal: No aggressive osseous lesion. CT ABDOMEN AND PELVIS FINDINGS Hepatobiliary: No focal hepatic lesion. No biliary ductal dilatation. Gallbladder is normal. Common bile duct is normal. Pancreas: Pancreas is normal. No ductal dilatation. No pancreatic inflammation. Spleen: Normal spleen Adrenals/urinary tract: Adrenal glands and kidneys are normal. The ureters and bladder normal. Stomach/Bowel: Stomach, small bowel, appendix, and cecum are normal. The colon and rectosigmoid colon are normal. Vascular/Lymphatic: Abdominal aorta is normal caliber. There is no retroperitoneal or periportal lymphadenopathy. No pelvic lymphadenopathy. Reproductive: Prostate unremarkable Other: No peritoneal metastasis Musculoskeletal: Multiple small subcentimeter calcifications in the iliac bones unchanged from prior. IMPRESSION: Chest Impression: 1. Concern for metastatic nodal recurrence within the anterior central neck with enhancing nodes adjacent to the thyroid gland. Recommend FDG PET scan for further characterization. 2. Increased consolidation in the LEFT upper lobe with differential including tumor progression versus radiation change progression. Recommend PET-CT as above. Abdomen / Pelvis Impression: 1.  No evidence of metastatic disease in the abdomen pelvis. 2.   No evidence skeletal metastasis. Electronically Signed   By: Suzy Bouchard M.D.   On: 03/01/2022 15:19   ECHOCARDIOGRAM COMPLETE  Result Date: 02/09/2022    ECHOCARDIOGRAM REPORT   Patient Name:   Kyle Guerrero Date of Exam: 02/09/2022 Medical Rec #:  [id number]         Height:       71.0 in Accession #:    [id number]        Weight:       149.0 lb Date of Birth:  1971-05-04         BSA:          1.861 m Patient Age:    89 years          BP:           120/82 mmHg Patient Gender: M                 HR:           79 bpm. Exam Location:  [facility] Procedure: 2D Echo, Color Doppler, Cardiac Doppler and Strain Analysis Indications:     Chemo Z09  History:          Patient has prior history of Echocardiogram examinations, most                  recent 01/11/2022. Lung cancer.  Sonographer:     Sherrie Sport Referring Phys:  [id number] [facility] Hritz Diagnosing Phys: Kate Sable MD  Sonographer Comments: Global longitudinal strain was attempted.  IMPRESSIONS  1. Left ventricular ejection fraction, by estimation, is 45 to 50%. The left ventricle has mildly decreased function. The left ventricle demonstrates global hypokinesis. Left ventricular diastolic parameters were normal.  2. Right ventricular systolic function is low normal. The right ventricular size is normal.  3. The mitral valve is normal in structure. Mild mitral valve regurgitation.  4. The aortic valve is tricuspid. Aortic valve regurgitation is not visualized.  5. The inferior vena cava is normal in size with greater than 50% respiratory variability, suggesting right atrial pressure of 3 mmHg. FINDINGS  Left Ventricle: Left ventricular ejection fraction, by estimation, is 45 to 50%. The left ventricle has mildly decreased function. The left ventricle demonstrates global hypokinesis. Global longitudinal strain performed but not reported based on interpreter judgement due to suboptimal tracking. 3D left ventricular ejection fraction analysis performed but not reported based on interpreter judgement due to suboptimal tracking. The left ventricular internal cavity size was normal in size. There is no left ventricular hypertrophy. Left ventricular diastolic parameters were normal. Right Ventricle: The right ventricular size is normal. No increase in right ventricular wall thickness. Right ventricular systolic function is low normal. Left Atrium: Left atrial size was normal in size. Right Atrium: Right atrial size was normal in size. Pericardium: There is no evidence of pericardial effusion. Mitral Valve: The mitral valve is normal in structure. Mild mitral valve regurgitation. Tricuspid Valve: The tricuspid valve is  normal in structure. Tricuspid valve regurgitation is mild. Aortic Valve: The aortic valve is tricuspid. Aortic valve regurgitation is not visualized. Aortic valve mean gradient measures 3.0 mmHg. Aortic valve peak gradient measures 4.4 mmHg. Aortic valve area, by VTI measures 2.01 cm. Pulmonic Valve: The pulmonic valve was normal in structure. Pulmonic valve regurgitation is not visualized. Aorta: The aortic root is normal in size and structure. Venous: The inferior vena cava is normal in size with greater than 50% respiratory variability, suggesting right atrial pressure of 3 mmHg. IAS/Shunts: No atrial level shunt detected by color flow Doppler.  LEFT VENTRICLE PLAX 2D LVIDd:         5.00 cm   Diastology LVIDs:         3.00 cm   LV e' medial:    10.80 cm/s LV PW:         1.10 cm   LV E/e' medial:  5.2 LV IVS:        0.80 cm   LV e' lateral:   11.70 cm/s LVOT diam:     2.00 cm   LV E/e' lateral: 4.8 LV SV:         36 LV SV Index:   19 LVOT Area:     3.14 cm  RIGHT VENTRICLE RV S prime:     12.00 cm/s TAPSE (M-mode): 1.6 cm LEFT ATRIUM             Index        RIGHT ATRIUM           Index LA diam:        2.30 cm 1.24 cm/m   RA Area:     10.00 cm LA Vol (A2C):   35.4 ml 19.03 ml/m  RA Volume:   20.90 ml  11.23 ml/m LA Vol (A4C):   20.3 ml 10.91 ml/m LA Biplane Vol: 26.5 ml 14.24 ml/m  AORTIC VALVE                    PULMONIC VALVE AV Area (Vmax):  2.11 cm     PV Vmax:        0.57 m/s AV Area (Vmean):   1.93 cm     PV Vmean:       39.150 cm/s AV Area (VTI):     2.01 cm     PV VTI:         0.107 m AV Vmax:           105.00 cm/s  PV Peak grad:   1.3 mmHg AV Vmean:          78.100 cm/s  PV Mean grad:   1.0 mmHg AV VTI:            0.180 m      RVOT Peak grad: 3 mmHg AV Peak Grad:      4.4 mmHg AV Mean Grad:      3.0 mmHg LVOT Vmax:         70.50 cm/s LVOT Vmean:        48.000 cm/s LVOT VTI:          0.115 m LVOT/AV VTI ratio: 0.64  AORTA Ao Root diam: 3.20 cm MITRAL VALVE MV Area (PHT): 3.95 cm    SHUNTS MV  Decel Time: 192 msec    Systemic VTI:  0.12 m MV E velocity: 56.10 cm/s  Systemic Diam: 2.00 cm MV A velocity: 42.40 cm/s  Pulmonic VTI:  0.161 m MV E/A ratio:  1.32 Kate Sable MD Electronically signed by Kate Sable MD Signature Date/Time: 02/09/2022/2:53:27 PM    Final      Assessment and plan- Patient is a 51 y.o. male ***   Visit Diagnosis 1. Malignant neoplasm of upper lobe of left lung ([place])      Dr. Randa Evens, MD, MPH [facility] [phone] 03/04/2022 1:29 PM

## 2022-03-13 ENCOUNTER — Encounter: Payer: Self-pay | Admitting: Oncology

## 2022-03-16 ENCOUNTER — Ambulatory Visit (HOSPITAL_COMMUNITY)
Admission: RE | Admit: 2022-03-16 | Discharge: 2022-03-16 | Disposition: A | Discharge status: Home / Self Care | Payer: 59 | Source: Ambulatory Visit | Attending: Oncology | Admitting: Oncology

## 2022-03-16 ENCOUNTER — Other Ambulatory Visit: Payer: Self-pay

## 2022-03-16 DIAGNOSIS — Z5111 Encounter for antineoplastic chemotherapy: Secondary | ICD-10-CM | POA: Diagnosis not present

## 2022-03-16 DIAGNOSIS — S2242XA Multiple fractures of ribs, left side, initial encounter for closed fracture: Secondary | ICD-10-CM | POA: Diagnosis not present

## 2022-03-16 DIAGNOSIS — C3412 Malignant neoplasm of upper lobe, left bronchus or lung: Secondary | ICD-10-CM | POA: Diagnosis not present

## 2022-03-16 DIAGNOSIS — C7951 Secondary malignant neoplasm of bone: Secondary | ICD-10-CM | POA: Diagnosis not present

## 2022-03-16 DIAGNOSIS — C3490 Malignant neoplasm of unspecified part of unspecified bronchus or lung: Secondary | ICD-10-CM | POA: Diagnosis not present

## 2022-03-16 LAB — GLUCOSE, CAPILLARY: Glucose-Capillary: 100 mg/dL — ABNORMAL HIGH (ref 70–99)

## 2022-03-16 IMAGING — PT NM PET TUM IMG RESTAG (PS) SKULL BASE T - THIGH
1 of 8 series · 1 of 25 positions shown · non-contrast
Comparison: [DATE] chest abdomen and pelvic CTs. Most recent
PET of [DATE]

CLINICAL DATA: Subsequent treatment strategy for restaging of
non-small-cell lung cancer. Known metastasis to cervical spine.
Chemotherapy in [3L].

EXAM:
NUCLEAR MEDICINE PET SKULL BASE TO THIGH
TECHNIQUE: 7.3 mCi F-18 FDG was injected intravenously. Full-ring PET imaging
was performed from the skull base to thigh after the radiotracer. CT
data was obtained and used for attenuation correction and anatomic
localization.
Fasting blood glucose: 100 mg/dl

[Series 4: ct sk_thigh 5.0 bf37 · axial · 5.0mm · 0.98mm/px · 1 of 236 slices shown]
[im 236/236  brain]
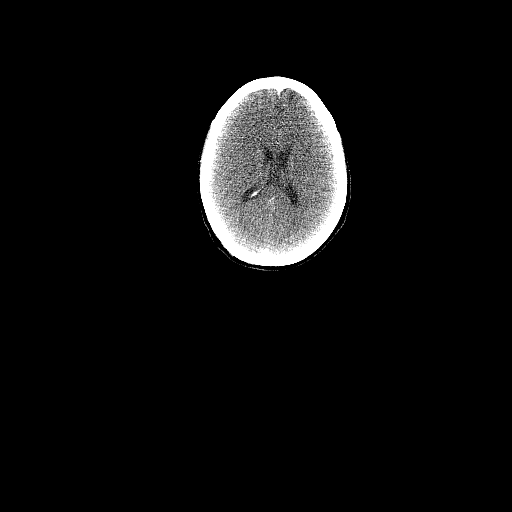

[1 of 25 positions shown; findings below may reference images not displayed]

FINDINGS: Mediastinal blood pool activity: SUV max

Liver activity: SUV max NA

NECK: The area of residual disease centered anterior to the
cervicothoracic junction measures a S.U.V. max of 13.9 on 50/4
versus a S.U.V. max of 11.2 on the prior exam.

However, there is extension about the anterior right side of the
lower cervical vertebral bodies including at a S.U.V. max of 14.2 on
approximately image 33/4. This continues to the craniocaudal level
of the palatine tonsils.

A low right jugular/supraclavicular node measures 1.4 cm and a
S.U.V. max of 12.9 on 51/4, new since the prior PET.

A presumed anterior low jugular nodal focus of hypermetabolism is
difficult to delineate on CT but measures a S.U.V. max of 8.6 on
approximately image 53/4.

Incidental CT findings: Ill definition of fat planes secondary to
radiation therapy in the lower neck.

CHEST: Within the area of radiated left apex, a small focus of
hypermetabolism is without CT correlate and suspicious for local
recurrence. Example at a S.U.V. max of 8.7 on approximately image
63/4.

No thoracic nodal hypermetabolism.

Incidental CT findings: Deferred to recent diagnostic CT. Right
Port-A-Cath tip high right atrium. Small left pleural effusion is
unchanged.

ABDOMEN/PELVIS: No abdominopelvic parenchymal or nodal
hypermetabolism.

Incidental CT findings: Normal adrenal glands. Normal noncontrast
appearance of the pancreas, gallbladder, liver, kidneys, prostate.

SKELETON: No abnormal marrow activity.

Incidental CT findings: Extensive surgical changes about the
cervical and upper thoracic spine. Chronic fractures of the
posterior left third and fourth ribs, likely radiation induced.
IMPRESSION: 1. Progressive disease throughout the low neck, centered about the
prevertebral space with new extension anteriorly and rightward.
2. Focal hypermetabolism within the radiated left apex, suspicious
for small volume residual or recurrent disease.
3. No subdiaphragmatic hypermetabolic metastasis identified.
4. Similar trace left pleural fluid.

## 2022-03-16 MED ORDER — FLUDEOXYGLUCOSE F - 18 (FDG) INJECTION
7.5000 | Freq: Once | INTRAVENOUS | Status: AC
  Administered 2022-03-16: 7.28 via INTRAVENOUS

## 2022-03-21 ENCOUNTER — Other Ambulatory Visit: Payer: Self-pay | Admitting: Oncology

## 2022-03-21 DIAGNOSIS — C7951 Secondary malignant neoplasm of bone: Secondary | ICD-10-CM | POA: Diagnosis not present

## 2022-03-21 DIAGNOSIS — J45909 Unspecified asthma, uncomplicated: Secondary | ICD-10-CM | POA: Diagnosis not present

## 2022-03-21 DIAGNOSIS — C3412 Malignant neoplasm of upper lobe, left bronchus or lung: Secondary | ICD-10-CM

## 2022-03-21 DIAGNOSIS — I428 Other cardiomyopathies: Secondary | ICD-10-CM | POA: Diagnosis not present

## 2022-03-21 DIAGNOSIS — Z01818 Encounter for other preprocedural examination: Secondary | ICD-10-CM | POA: Diagnosis not present

## 2022-03-21 DIAGNOSIS — C3492 Malignant neoplasm of unspecified part of left bronchus or lung: Secondary | ICD-10-CM | POA: Diagnosis not present

## 2022-03-21 DIAGNOSIS — Z20822 Contact with and (suspected) exposure to covid-19: Secondary | ICD-10-CM | POA: Diagnosis not present

## 2022-03-21 MED ORDER — LIDOCAINE-PRILOCAINE 2.5-2.5 % EX CREA: TOPICAL_CREAM | CUTANEOUS | 3 refills | Status: AC

## 2022-03-21 MED ORDER — LORAZEPAM 0.5 MG PO TABS: 0.5000 mg | ORAL_TABLET | Freq: Four times a day (QID) | ORAL | 0 refills | Status: AC | PRN

## 2022-03-21 MED ORDER — PROCHLORPERAZINE MALEATE 10 MG PO TABS: 10.0000 mg | ORAL_TABLET | Freq: Four times a day (QID) | ORAL | 1 refills | Status: DC | PRN

## 2022-03-21 MED ORDER — DEXAMETHASONE 4 MG PO TABS: 8.0000 mg | ORAL_TABLET | Freq: Two times a day (BID) | ORAL | 1 refills | Status: DC

## 2022-03-21 MED ORDER — ONDANSETRON HCL 8 MG PO TABS: 8.0000 mg | ORAL_TABLET | Freq: Two times a day (BID) | ORAL | 1 refills | Status: AC | PRN

## 2022-03-21 NOTE — Progress Notes (Signed)
DISCONTINUE OFF PATHWAY REGIMEN - Other ? ? ?OFF03553:Carboplatin AUC=5 + Pemetrexed 500 mg/m2 q21 Days: ?  A cycle is every 21 days: ?    Pemetrexed  ?    Carboplatin  ? ?**Always confirm dose/schedule in your pharmacy ordering system** ? ?REASON: Disease Progression ?PRIOR TREATMENT: Carboplatin AUC=5 + Pemetrexed 500 mg/m2 q21 Days ?TREATMENT RESPONSE: Progressive Disease (PD) ? ?START OFF PATHWAY REGIMEN - Other ? ? ?OFF00006:Docetaxel 100 mg/m2 IV D1 q21 Days: ?  A cycle is every 21 days: ?    Docetaxel  ? ?**Always confirm dose/schedule in your pharmacy ordering system** ? ?Patient Characteristics: ?Intent of Therapy: ?Non-Curative / Palliative Intent, Discussed with Patient ?

## 2022-03-21 NOTE — Progress Notes (Signed)
The following biosimilar Fulphila ([id number]) has been selected for use in this patient.  ? ?Henreitta Leber, PharmD ?03/21/2022 @ 1015 ?

## 2022-03-23 DIAGNOSIS — S143XXA Injury of brachial plexus, initial encounter: Secondary | ICD-10-CM | POA: Diagnosis not present

## 2022-03-23 DIAGNOSIS — Z85830 Personal history of malignant neoplasm of bone: Secondary | ICD-10-CM | POA: Diagnosis not present

## 2022-03-25 ENCOUNTER — Telehealth: Payer: Self-pay | Admitting: *Deleted

## 2022-03-25 ENCOUNTER — Inpatient Hospital Stay: Payer: 59

## 2022-03-25 ENCOUNTER — Inpatient Hospital Stay: Payer: 59 | Admitting: Oncology

## 2022-03-25 NOTE — Telephone Encounter (Signed)
Message from answering service that patient called to cancel his appointment today ?

## 2022-03-29 ENCOUNTER — Encounter: Payer: Self-pay | Admitting: Oncology

## 2022-03-31 NOTE — Telephone Encounter (Signed)
Hey! Just following up on him--do we need to do anything at this point?

## 2022-04-05 DIAGNOSIS — Z4889 Encounter for other specified surgical aftercare: Secondary | ICD-10-CM | POA: Diagnosis not present

## 2022-04-07 NOTE — Progress Notes (Signed)
Pharmacist Chemotherapy Monitoring - Initial Assessment   ? ?Anticipated start date: 04/15/22  ? ?The following has been reviewed per standard work regarding the patient's treatment regimen: ?The patient's diagnosis, treatment plan and drug doses, and organ/hematologic function ?Lab orders and baseline tests specific to treatment regimen  ?The treatment plan start date, drug sequencing, and pre-medications ?Prior authorization status  ?Patient's documented medication list, including drug-drug interaction screen and prescriptions for anti-emetics and supportive care specific to the treatment regimen ?The drug concentrations, fluid compatibility, administration routes, and timing of the medications to be used ?The patient's access for treatment and lifetime cumulative dose history, if applicable  ?The patient's medication allergies and previous infusion related reactions, if applicable  ? ?Changes made to treatment plan:  ?treatment plan date ? ?Follow up needed:  ?Pending authorization for treatment  ? ? ?Kyle Guerrero, [facility], ?04/07/2022  1:32 PM  ?

## 2022-04-08 ENCOUNTER — Other Ambulatory Visit: Payer: Self-pay | Admitting: *Deleted

## 2022-04-08 MED ORDER — OXYCODONE HCL 5 MG PO TABS: 5.0000 mg | ORAL_TABLET | ORAL | 0 refills | Status: DC | PRN

## 2022-04-08 MED ORDER — PREGABALIN 75 MG PO CAPS: 75.0000 mg | ORAL_CAPSULE | Freq: Two times a day (BID) | ORAL | 0 refills | Status: DC

## 2022-04-08 NOTE — Telephone Encounter (Signed)
Pt of Dr. Elroy Channel requesting refill of oxycodone and lyrica to be sent into [facility] on [address].  ?

## 2022-04-15 ENCOUNTER — Inpatient Hospital Stay: Payer: 59

## 2022-04-15 ENCOUNTER — Inpatient Hospital Stay (HOSPITAL_BASED_OUTPATIENT_CLINIC_OR_DEPARTMENT_OTHER): Payer: 59 | Admitting: Oncology

## 2022-04-15 ENCOUNTER — Telehealth: Payer: Self-pay

## 2022-04-15 ENCOUNTER — Inpatient Hospital Stay: Payer: 59 | Attending: Oncology

## 2022-04-15 ENCOUNTER — Encounter: Payer: Self-pay | Admitting: Oncology

## 2022-04-15 VITALS — BP 108/77 | HR 102 | Temp 97.2°F | Resp 16 | Ht 71.0 in | Wt 133.9 lb

## 2022-04-15 VITALS — BP 105/80 | HR 88 | Temp 98.3°F | Resp 18

## 2022-04-15 DIAGNOSIS — Z5111 Encounter for antineoplastic chemotherapy: Secondary | ICD-10-CM | POA: Diagnosis not present

## 2022-04-15 DIAGNOSIS — Z7189 Other specified counseling: Secondary | ICD-10-CM

## 2022-04-15 DIAGNOSIS — G893 Neoplasm related pain (acute) (chronic): Secondary | ICD-10-CM | POA: Diagnosis not present

## 2022-04-15 DIAGNOSIS — C3412 Malignant neoplasm of upper lobe, left bronchus or lung: Secondary | ICD-10-CM | POA: Insufficient documentation

## 2022-04-15 DIAGNOSIS — Z5189 Encounter for other specified aftercare: Secondary | ICD-10-CM | POA: Insufficient documentation

## 2022-04-15 DIAGNOSIS — C782 Secondary malignant neoplasm of pleura: Secondary | ICD-10-CM | POA: Insufficient documentation

## 2022-04-15 DIAGNOSIS — Z79899 Other long term (current) drug therapy: Secondary | ICD-10-CM | POA: Insufficient documentation

## 2022-04-15 DIAGNOSIS — Z87891 Personal history of nicotine dependence: Secondary | ICD-10-CM | POA: Diagnosis not present

## 2022-04-15 LAB — COMPREHENSIVE METABOLIC PANEL
ALT: 25 U/L (ref 0–44)
AST: 22 U/L (ref 15–41)
Albumin: 3.5 g/dL (ref 3.5–5.0)
Alkaline Phosphatase: 97 U/L (ref 38–126)
Anion gap: 9 (ref 5–15)
BUN: 11 mg/dL (ref 6–20)
CO2: 28 mmol/L (ref 22–32)
Calcium: 9.5 mg/dL (ref 8.9–10.3)
Chloride: 97 mmol/L — ABNORMAL LOW (ref 98–111)
Creatinine, Ser: 0.97 mg/dL (ref 0.61–1.24)
GFR, Estimated: >60 mL/min (ref >60)
Glucose, Bld: 107 mg/dL — ABNORMAL HIGH (ref 70–99)
Potassium: 3.8 mmol/L (ref 3.5–5.1)
Sodium: 134 mmol/L — ABNORMAL LOW (ref 135–145)
Total Bilirubin: 0.5 mg/dL (ref 0.3–1.2)
Total Protein: 7.9 g/dL (ref 6.5–8.1)

## 2022-04-15 LAB — CBC WITH DIFFERENTIAL/PLATELET
Abs Immature Granulocytes: 0.01 10*3/uL (ref 0.00–0.07)
Basophils Absolute: 0 10*3/uL (ref 0.0–0.1)
Basophils Relative: 0 %
Eosinophils Absolute: 0.1 10*3/uL (ref 0.0–0.5)
Eosinophils Relative: 2 %
HCT: 37 % — ABNORMAL LOW (ref 39.0–52.0)
Hemoglobin: 12 g/dL — ABNORMAL LOW (ref 13.0–17.0)
Immature Granulocytes: 0 %
Lymphocytes Relative: 28 %
Lymphs Abs: 1.7 10*3/uL (ref 0.7–4.0)
MCH: 28 pg (ref 26.0–34.0)
MCHC: 32.4 g/dL (ref 30.0–36.0)
MCV: 86.4 fL (ref 80.0–100.0)
Monocytes Absolute: 0.7 10*3/uL (ref 0.1–1.0)
Monocytes Relative: 12 %
Neutro Abs: 3.4 10*3/uL (ref 1.7–7.7)
Neutrophils Relative %: 58 %
Platelets: 522 10*3/uL — ABNORMAL HIGH (ref 150–400)
RBC: 4.28 MIL/uL (ref 4.22–5.81)
RDW: 14.1 % (ref 11.5–15.5)
WBC: 6 10*3/uL (ref 4.0–10.5)
nRBC: 0 % (ref 0.0–0.2)

## 2022-04-15 MED ORDER — HEPARIN SOD (PORK) LOCK FLUSH 100 UNIT/ML IV SOLN
500.0000 [IU] | Freq: Once | INTRAVENOUS | Status: AC | PRN
  Administered 2022-04-15: 500 [IU]
  Filled 2022-04-15: qty 5

## 2022-04-15 MED ORDER — SODIUM CHLORIDE 0.9 % IV SOLN
Freq: Once | INTRAVENOUS | Status: AC
  Filled 2022-04-15: qty 250

## 2022-04-15 MED ORDER — SODIUM CHLORIDE 0.9% FLUSH
10.0000 mL | Freq: Once | INTRAVENOUS | Status: AC
  Administered 2022-04-15: 10 mL via INTRAVENOUS
  Filled 2022-04-15: qty 10

## 2022-04-15 MED ORDER — SODIUM CHLORIDE 0.9 % IV SOLN
75.0000 mg/m2 | Freq: Once | INTRAVENOUS | Status: AC
  Administered 2022-04-15: 140 mg via INTRAVENOUS
  Filled 2022-04-15: qty 14

## 2022-04-15 MED ORDER — OLANZAPINE 10 MG PO TABS: 10.0000 mg | ORAL_TABLET | Freq: Every day | ORAL | 0 refills | Status: DC

## 2022-04-15 MED ORDER — SODIUM CHLORIDE 0.9 % IV SOLN
10.0000 mg | Freq: Once | INTRAVENOUS | Status: AC
  Administered 2022-04-15: 10 mg via INTRAVENOUS
  Filled 2022-04-15: qty 10
  Filled 2022-04-15: qty 1

## 2022-04-15 NOTE — Progress Notes (Addendum)
Nutrition Follow-up: ? ?Patient with recurrent metastatic adenocarcinoma of lung.  Large tumor found in cervical spine with mass displacing the left pharynx, s/p surgery.  Patient starting 3rd line treatment of docetaxel.   ? ?Met with patient in infusion. Patient reports that swallowing is hard.  "I have this "cage" in my throat." Had surgery in Sept 2022.   Says that he has to drink so much liquid to get foods down that he fills up quick.  "I don't do anything to have an appetite either." "Just sit around all day."  Says that yesterday he was able to eat grits, 2 eggs, bacon from [employer].  Also ate a hot dog with mustard and chili. Drank 2 boost yesterday.  Says that the shakes are not upsetting his stomach.  ? ?Medications: reviewed ? ?Labs: reviewed ? ?Anthropometrics:  ? ?Weight 133 lb 14.4 oz today ? ?146 lb 12.8 oz on 3/10 ?149 lb on 1/26 ?150 lb on 1/11 ?144 lb 12.8 oz on 11/10 ?146 lb on 11/10 ?153 lb on 09/15/21 ? ?9% weight loss in the last month, significant ? ? ?NUTRITION DIAGNOSIS: Inadequate oral intake ongoing ? ? ?INTERVENTION:  ?Recommend evaluation with SLP to evaluate swallowing. Message sent to MD and discussed with patient ?Continue high calorie, high protein soft, moist foods for ease of swallowing.  ?Continue oral nutrition supplements.  Samples given to patient today along with coupons. ? ?  ? ?MONITORING, EVALUATION, GOAL: weight trends, intake ? ? ?NEXT VISIT: Tuesday, May 16 phone call ? ?Charlize Hathaway B. Zenia Resides, RD, LDN ?Registered Dietitian ?[phone] ? ? ?

## 2022-04-15 NOTE — Progress Notes (Signed)
HR 102 ok to proceed ?

## 2022-04-15 NOTE — Telephone Encounter (Signed)
An appeal has been sent to [facility] in regards to pts LYRICA has been faxed to [phone] and receieved a fax confirmation.  ?

## 2022-04-15 NOTE — Progress Notes (Signed)
Pt having swallowing problems with solid foods, drinks 2 boost/ensure  per day. Like to drink colas also. He has neck and shoulder pain at all times he says 6 now. ?

## 2022-04-15 NOTE — Progress Notes (Signed)
? ? ? ?Hematology/Oncology Consult note ?[place]  ?[phone] [phone] ? ?Patient Care Team: ?Pcp, No as PCP - General ?Kate Sable, MD as PCP - Cardiology (Cardiology) ?Telford Nab, RN as Equities trader ?Sindy Guadeloupe, MD as Consulting Physician (Hematology and Oncology) ?Sindy Guadeloupe, MD as Consulting Physician (Hematology and Oncology)  ? ?Name of the patient: Kyle Guerrero  ?[id number]  ?05/30/1971  ? ?Date of visit: 04/15/22 ? ?Diagnosis-  Non-small cell lung cancer stage IV acT2 cN2 cM1 a with pleural involvement as well as large cervical neck mass ? ?Chief complaint/ Reason for visit-on treatment assessment prior to cycle 1 of docetaxel chemotherapy ? ?Heme/Onc history: patient is a 51 year old male who presented to the ER with symptoms of heaviness in his chest and upper left chest discomfort he underwent CT angio chest to rule out PE which showed 3.8 x 3.3 cm left upper palpable lung mass along with 4.4 x 3.3 cm lobulated mass in the aortopulmonary window and 2.6 x 2.4 cm left hilar mass all concerning for malignancy.  Patient has also seen pulmonary and has been set up for bronchoscopy and EBUS guided biopsy on 11/23/2019.  Patient underwent.  PET CT scan which showed a hypermetabolic spiculated 3.5 cm of 5 left upper lobe lung mass, adjacent hypermetabolic 3.2 x 1.2 cm pleural metastases in the medial posterior by the left pleural space along with scalloping of the adjacent posterior left third rib.  Hypermetabolic infiltrative left perihilar conglomerate nodal metastases measuring up to 7.3 x 3.6 cm and 0.8 cm high left mediastinal node between the left brachiocephalic vein and left subclavian artery.  No evidence of distant metastatic disease ?  ?Biopsy showed non-small cell lung cancer but further characterization could not be determined.  Insufficient tissue for NGS testing. Repeat biopsy done. Results of NGS testing showed PD-L1 50%.   Tumor mutational burden high.  ERBB2 copy number again. 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  ?  ?Patient completed concurrent chemoradiation with carbotaxol chemotherapy followed by 2 cycles of carbotaxol Keytruda and was on maintenance Keytruda ?  ?Patient was complaining of left shoulder pain and underwent MRI of the shoulder which showed tear in the supraspinatus tendon.  He was subsequently seen by [facility] and underwent MRI of the cervical spine which showed a 4.6 x 4.8 x 8.2 cm mass in the left prevertebral region from C2-C7 levels.  The mass partially encases the left vertebral artery and abuts the preforaminal segment.  Invades the vertebral bodies and edema of the left C5 articular pillar.  There is slight extension into the left C4-C5 thecal sac without central thecal sac compression.  The mass displaces the left pharynx anteriorly along the left carotid sheath.  Patient had a repeat biopsy which was consistent with adenocarcinoma. ?  ?Patient underwent palliative radiation treatment to his neck mass and completed 4 cycles of carboplatin and [name] with excellent response to treatment and is currently on maintenance [name].  Repeat NGS testing was also performed which did not show any evidence of actionable mutations other than ERBB2 gain ?  ?Patient had recurrence of his neck pain and repeat imaging showedInterim increase in the size of his neck mass and patient went for second opinion for both medical oncology as well as neurosurgery continue current was decided to cooperate upon this mass for definitive resection.  Patient underwent resection of cervical tumor which showed poorly differentiated metastatic adenocarcinoma on 08/29/2021.  TTF-1 negative. ?  ?  Patient received 3-4 cycles of Enhertu but had a drop in his ejection fraction which did not recover.  Scan showed evidence of progression and patient was switched to docetaxel ? ?Interval history- Patient was seen [facility] and  underwent nerve transplant and brachial neuroplasty on 03/23/2022.  He continues to report pain in his left arm and does not have much of recovery of function yet.  Also complains of neck pain.  Appetite is poor and he has lost 13 pounds in the last 6 weeks. ? ?ECOG PS- 1 ?Pain scale- 4 ?Opioid associated constipation- no ? ?Review of systems- Review of Systems  ?Constitutional:  Positive for malaise/fatigue and weight loss. Negative for chills and fever.  ?HENT:  Negative for congestion, ear discharge and nosebleeds.   ?     Neck pain  ?Eyes:  Negative for blurred vision.  ?Respiratory:  Negative for cough, hemoptysis, sputum production, shortness of breath and wheezing.   ?Cardiovascular:  Negative for chest pain, palpitations, orthopnea and claudication.  ?Gastrointestinal:  Negative for abdominal pain, blood in stool, constipation, diarrhea, heartburn, melena, nausea and vomiting.  ?Genitourinary:  Negative for dysuria, flank pain, frequency, hematuria and urgency.  ?Musculoskeletal:  Negative for back pain, joint pain and myalgias.  ?Skin:  Negative for rash.  ?Neurological:  Negative for dizziness, tingling, focal weakness, seizures, weakness and headaches.  ?Endo/Heme/Allergies:  Does not bruise/bleed easily.  ?Psychiatric/Behavioral:  Negative for depression and suicidal ideas. The patient does not have insomnia.    ? ? ? ?No Known Allergies ? ? ?Past Medical History:  ?Diagnosis Date  ? Asthma   ? Lung cancer ([place])   ? ? ? ?Past Surgical History:  ?Procedure Laterality Date  ? CYST EXCISION    ? IR IMAGING GUIDED PORT INSERTION  12/05/2019  ? VIDEO BRONCHOSCOPY WITH ENDOBRONCHIAL ULTRASOUND Left 11/22/2019  ? Procedure: VIDEO BRONCHOSCOPY WITH ENDOBRONCHIAL ULTRASOUND;  Surgeon: Tyler Pita, MD;  Location: [facility] ORS;  Service: Thoracic;  Laterality: Left;  ? ? ?Social History  ? ?Socioeconomic History  ? Marital status: Single  ?  Spouse name: Not on file  ? Number of children: Not on file  ? Years of  education: Not on file  ? Highest education level: Not on file  ?Occupational History  ? Not on file  ?Tobacco Use  ? Smoking status: Former  ?  Packs/day: 2.00  ?  Types: Cigarettes  ?  Quit date: 11/08/2019  ?  Years since quitting: 2.4  ? Smokeless tobacco: Never  ?Vaping Use  ? Vaping Use: Never used  ?Substance and Sexual Activity  ? Alcohol use: Not Currently  ? Drug use: Yes  ?  Types: Marijuana  ? Sexual activity: Not Currently  ?Other Topics Concern  ? Not on file  ?Social History Narrative  ? Not on file  ? ?Social Determinants of Health  ? ?Financial Resource Strain: Not on file  ?Food Insecurity: Not on file  ?Transportation Needs: Not on file  ?Physical Activity: Not on file  ?Stress: Not on file  ?Social Connections: Not on file  ?Intimate Partner Violence: Not on file  ? ? ?Family History  ?Problem Relation Age of Onset  ? Healthy Mother   ? Healthy Father   ? ? ? ?Current Outpatient Medications:  ?  baclofen (LIORESAL) 10 MG tablet, TAKE 1 TABLET BY MOUTH THREE TIMES A DAY, Disp: 270 tablet, Rfl: 1 ?  docusate sodium (COLACE) 100 MG capsule, Take 100 mg by mouth daily., Disp: ,  Rfl:  ?  lidocaine-prilocaine (EMLA) cream, Apply to affected area once, Disp: 30 g, Rfl: 3 ?  losartan (COZAAR) 25 MG tablet, Take 1 tablet (25 mg total) by mouth daily., Disp: 30 tablet, Rfl: 3 ?  metoprolol succinate (TOPROL XL) 25 MG 24 hr tablet, Take 1 tablet (25 mg total) by mouth daily., Disp: 30 tablet, Rfl: 5 ?  oxyCODONE (OXY IR/ROXICODONE) 5 MG immediate release tablet, Take 1 tablet (5 mg total) by mouth every 4 (four) hours as needed for moderate pain or severe pain., Disp: 30 tablet, Rfl: 0 ?  dexamethasone (DECADRON) 4 MG tablet, Take 2 tablets (8 mg total) by mouth 2 (two) times daily. Start the day before Taxotere. Then daily after chemo for 2 days. (Patient not taking: Reported on 04/15/2022), Disp: 30 tablet, Rfl: 1 ?  LORazepam (ATIVAN) 0.5 MG tablet, Take 1 tablet (0.5 mg total) by mouth every 6 (six)  hours as needed (Nausea or vomiting). (Patient not taking: Reported on 04/15/2022), Disp: 30 tablet, Rfl: 0 ?  ondansetron (ZOFRAN) 8 MG tablet, Take 1 tablet (8 mg total) by mouth 2 (two) times daily as need

## 2022-04-15 NOTE — Patient Instructions (Signed)
[facility] [place]  Discharge Instructions: ?Thank you for choosing [facility] to provide your oncology and hematology care.  ?If you have a lab appointment with the [facility], please go directly to the [facility] and check in at the registration area. ? ?Wear comfortable clothing and clothing appropriate for easy access to any Portacath or PICC line.  ? ?We strive to give you quality time with your provider. You may need to reschedule your appointment if you arrive late (15 or more minutes).  Arriving late affects you and other patients whose appointments are after yours.  Also, if you miss three or more appointments without notifying the office, you may be dismissed from the clinic at the provider?s discretion.    ?  ?For prescription refill requests, have your pharmacy contact our office and allow 72 hours for refills to be completed.   ? ?Today you received the following chemotherapy and/or immunotherapy agents: Taxotere    ?  ?To help prevent nausea and vomiting after your treatment, we encourage you to take your nausea medication as directed. ? ?BELOW ARE SYMPTOMS THAT SHOULD BE REPORTED IMMEDIATELY: ?*FEVER GREATER THAN 100.4 F (38 ?C) OR HIGHER ?*CHILLS OR SWEATING ?*NAUSEA AND VOMITING THAT IS NOT CONTROLLED WITH YOUR NAUSEA MEDICATION ?*UNUSUAL SHORTNESS OF BREATH ?*UNUSUAL BRUISING OR BLEEDING ?*URINARY PROBLEMS (pain or burning when urinating, or frequent urination) ?*BOWEL PROBLEMS (unusual diarrhea, constipation, pain near the anus) ?TENDERNESS IN MOUTH AND THROAT WITH OR WITHOUT PRESENCE OF ULCERS (sore throat, sores in mouth, or a toothache) ?UNUSUAL RASH, SWELLING OR PAIN  ?UNUSUAL VAGINAL DISCHARGE OR ITCHING  ? ?Items with * indicate a potential emergency and should be followed up as soon as possible or go to the Emergency Department if any problems should occur. ? ?Please show the CHEMOTHERAPY ALERT CARD or IMMUNOTHERAPY ALERT CARD at check-in to  the Emergency Department and triage nurse. ? ?Should you have questions after your visit or need to cancel or reschedule your appointment, please contact [facility] [place] AT [place]  [phone] and follow the prompts.  Office hours are 8:00 a.m. to 4:30 p.m. Monday - Friday. Please note that voicemails left after 4:00 p.m. may not be returned until the following business day.  We are closed weekends and major holidays. You have access to a nurse at all times for urgent questions. Please call the main number to the clinic [phone] and follow the prompts. ? ?For any non-urgent questions, you may also contact your provider using MyChart. We now offer e-Visits for anyone 54 and older to request care online for non-urgent symptoms. For details visit [email or web address]. ?  ?Also download the MyChart app! Go to the app store, search "MyChart", open the app, select [facility], and log in with your MyChart username and password. ? ?Due to Covid, a mask is required upon entering the hospital/clinic. If you do not have a mask, one will be given to you upon arrival. For doctor visits, patients may have 1 support person aged 54 or older with them. For treatment visits, patients cannot have anyone with them due to current Covid guidelines and our immunocompromised population. Docetaxel injection ?What is this medication? ?DOCETAXEL (doe se TAX el) is a chemotherapy drug. It targets fast dividing cells, like cancer cells, and causes these cells to die. This medicine is used to treat many types of cancers like breast cancer, certain stomach cancers, head and neck cancer, lung cancer, and prostate cancer. ?This medicine may  be used for other purposes; ask your health care provider or pharmacist if you have questions. ?COMMON BRAND NAME(S): Docefrez, Taxotere ?What should I tell my care team before I take this medication? ?They need to know if you have any of these conditions: ?infection (especially a  virus infection such as chickenpox, cold sores, or herpes) ?liver disease ?low blood counts, like low white cell, platelet, or red cell counts ?an unusual or allergic reaction to docetaxel, polysorbate 80, other chemotherapy agents, other medicines, foods, dyes, or preservatives ?pregnant or trying to get pregnant ?breast-feeding ?How should I use this medication? ?This drug is given as an infusion into a vein. It is administered in a hospital or clinic by a specially trained health care professional. ?Talk to your pediatrician regarding the use of this medicine in children. Special care may be needed. ?Overdosage: If you think you have taken too much of this medicine contact a poison control center or emergency room at once. ?NOTE: This medicine is only for you. Do not share this medicine with others. ?What if I miss a dose? ?It is important not to miss your dose. Call your doctor or health care professional if you are unable to keep an appointment. ?What may interact with this medication? ?Do not take this medicine with any of the following medications: ?live virus vaccines ?This medicine may also interact with the following medications: ?aprepitant ?certain antibiotics like erythromycin or clarithromycin ?certain antivirals for HIV or hepatitis ?certain medicines for fungal infections like fluconazole, itraconazole, ketoconazole, posaconazole, or voriconazole ?cimetidine ?ciprofloxacin ?conivaptan ?cyclosporine ?dronedarone ?fluvoxamine ?grapefruit juice ?imatinib ?verapamil ?This list may not describe all possible interactions. Give your health care provider a list of all the medicines, herbs, non-prescription drugs, or dietary supplements you use. Also tell them if you smoke, drink alcohol, or use illegal drugs. Some items may interact with your medicine. ?What should I watch for while using this medication? ?Your condition will be monitored carefully while you are receiving this medicine. You will need  important blood work done while you are taking this medicine. ?Call your doctor or health care professional for advice if you get a fever, chills or sore throat, or other symptoms of a cold or flu. Do not treat yourself. This drug decreases your body's ability to fight infections. Try to avoid being around people who are sick. ?Some products may contain alcohol. Ask your health care professional if this medicine contains alcohol. Be sure to tell all health care professionals you are taking this medicine. Certain medicines, like metronidazole and disulfiram, can cause an unpleasant reaction when taken with alcohol. The reaction includes flushing, headache, nausea, vomiting, sweating, and increased thirst. The reaction can last from 30 minutes to several hours. ?You may get drowsy or dizzy. Do not drive, use machinery, or do anything that needs mental alertness until you know how this medicine affects you. Do not stand or sit up quickly, especially if you are an older patient. This reduces the risk of dizzy or fainting spells. Alcohol may interfere with the effect of this medicine. ?Talk to your health care professional about your risk of cancer. You may be more at risk for certain types of cancer if you take this medicine. ?Do not become pregnant while taking this medicine or for 6 months after stopping it. Women should inform their doctor if they wish to become pregnant or think they might be pregnant. There is a potential for serious side effects to an unborn child. Talk to your health care  professional or pharmacist for more information. Do not breast-feed an infant while taking this medicine or for 1 week after stopping it. ?Males who get this medicine must use a condom during sex with females who can get pregnant. If you get a woman pregnant, the baby could have birth defects. The baby could die before they are born. You will need to continue wearing a condom for 3 months after stopping the medicine. Tell your  health care provider right away if your partner becomes pregnant while you are taking this medicine. ?This may interfere with the ability to father a child. You should talk to your doctor or health care professio

## 2022-04-18 ENCOUNTER — Inpatient Hospital Stay: Payer: 59

## 2022-04-18 DIAGNOSIS — C3412 Malignant neoplasm of upper lobe, left bronchus or lung: Secondary | ICD-10-CM

## 2022-04-18 DIAGNOSIS — Z5111 Encounter for antineoplastic chemotherapy: Secondary | ICD-10-CM | POA: Diagnosis not present

## 2022-04-18 MED ORDER — PEGFILGRASTIM-BMEZ 6 MG/0.6ML ~~LOC~~ SOSY
6.0000 mg | PREFILLED_SYRINGE | Freq: Once | SUBCUTANEOUS | Status: AC
  Administered 2022-04-18: 6 mg via SUBCUTANEOUS
  Filled 2022-04-18: qty 0.6

## 2022-04-19 ENCOUNTER — Other Ambulatory Visit: Payer: Self-pay | Admitting: *Deleted

## 2022-04-19 MED ORDER — OXYCODONE HCL 5 MG PO TABS: 5.0000 mg | ORAL_TABLET | ORAL | 0 refills | Status: AC | PRN

## 2022-04-19 MED ORDER — FENTANYL 12 MCG/HR TD PT72: 1.0000 | MEDICATED_PATCH | TRANSDERMAL | 0 refills | Status: AC

## 2022-04-19 NOTE — Telephone Encounter (Signed)
Follow up phone call made to patient to check on how his pain was doing after restarting fentanyl 62mcg patches last week. Pt states that his pain is a little bit better. Advised to continue wearing the pain patch and that will ask Dr. Janese Banks to send in refill for fentanyl patches and oxycodone to use for breakthrough pain. Pt verbalized understanding. ?

## 2022-04-21 ENCOUNTER — Other Ambulatory Visit: Payer: Self-pay

## 2022-04-21 ENCOUNTER — Telehealth: Payer: Self-pay

## 2022-04-21 MED ORDER — DULOXETINE HCL 30 MG PO CPEP: ORAL_CAPSULE | ORAL | 0 refills | Status: DC

## 2022-04-21 NOTE — Telephone Encounter (Signed)
New PA was done ([id number]) if no determination in 24 hours contact [phone] ?

## 2022-04-21 NOTE — Telephone Encounter (Signed)
Pt is aware of medication change: insurance has denied LYRICA twice. Per Dr. Janese Banks she has switched pt to duloxetine 30mg  daily for one week; then increase to 60mg  if tolerated well. Pt agrees and understands.  ?

## 2022-04-25 ENCOUNTER — Telehealth: Payer: Self-pay | Admitting: *Deleted

## 2022-04-25 ENCOUNTER — Other Ambulatory Visit: Payer: Self-pay | Admitting: *Deleted

## 2022-04-25 DIAGNOSIS — C3412 Malignant neoplasm of upper lobe, left bronchus or lung: Secondary | ICD-10-CM

## 2022-04-25 DIAGNOSIS — R1310 Dysphagia, unspecified: Secondary | ICD-10-CM

## 2022-04-25 NOTE — Telephone Encounter (Signed)
-----   Message from Jennet Maduro, [place] sent at 04/15/2022 11:35 AM EDT ----- ?Dr Janese Banks, ? ?Recommend evaluation by SLP Stormy Fabian) due to difficulty swallowing.  Patient is agreeable to seeing SLP to evaluation swallowing.  If you agree, will you please place referral (SLP to evaluate and treat with option of MBSS if needed) ? ?Thanks, ?Joli ? ?

## 2022-04-25 NOTE — Telephone Encounter (Signed)
Called pt and asked if he was ok to get speech swallow study due to his difficulty swallowing and he said yes. I put ref. In the [facility] system and patient should get a call with appt at [facility] regional in the physical therapy area. Also I told pt that his insurance will not approve the lyrica and instead they will allow cymbalta and he says he is picking up today. ?

## 2022-04-25 NOTE — Progress Notes (Signed)
Orders entered

## 2022-04-26 ENCOUNTER — Ambulatory Visit (INDEPENDENT_AMBULATORY_CARE_PROVIDER_SITE_OTHER): Payer: 59

## 2022-04-26 DIAGNOSIS — I5020 Unspecified systolic (congestive) heart failure: Secondary | ICD-10-CM | POA: Diagnosis not present

## 2022-04-26 LAB — ECHOCARDIOGRAM LIMITED
Area-P 1/2: 3.01 cm2
Calc EF: 40.8 %
S' Lateral: 3.4 cm
Single Plane A2C EF: 40.9 %
Single Plane A4C EF: 40.6 %

## 2022-04-28 ENCOUNTER — Other Ambulatory Visit: Payer: Self-pay | Admitting: *Deleted

## 2022-04-28 ENCOUNTER — Other Ambulatory Visit: Payer: Self-pay

## 2022-04-28 DIAGNOSIS — C3412 Malignant neoplasm of upper lobe, left bronchus or lung: Secondary | ICD-10-CM

## 2022-04-29 ENCOUNTER — Other Ambulatory Visit: Payer: Self-pay | Admitting: Oncology

## 2022-04-29 ENCOUNTER — Ambulatory Visit: Payer: 59 | Attending: Oncology | Admitting: Speech Pathology

## 2022-04-29 ENCOUNTER — Ambulatory Visit: Admission: RE | Admit: 2022-04-29 | Payer: 59 | Source: Ambulatory Visit

## 2022-04-29 DIAGNOSIS — R1312 Dysphagia, oropharyngeal phase: Secondary | ICD-10-CM | POA: Insufficient documentation

## 2022-04-29 DIAGNOSIS — C3412 Malignant neoplasm of upper lobe, left bronchus or lung: Secondary | ICD-10-CM

## 2022-04-29 DIAGNOSIS — C3410 Malignant neoplasm of upper lobe, unspecified bronchus or lung: Secondary | ICD-10-CM | POA: Insufficient documentation

## 2022-04-30 NOTE — Therapy (Signed)
[facility] ?[place], [place], [address] ?Phone: [phone]   Fax:  [phone] ? ?Speech Language Pathology Evaluation ?Clinical Swallow Evaluation ? ?Patient Details  ?Name: Kyle Guerrero ?MRN: [id number] ?Date of Birth: 1971/03/15 ?Referring Provider (SLP): Dr Randa Evens ? ? ?Encounter Date: 04/29/2022 ? ? End of Session - 04/30/22 1326   ? ? Visit Number 1   ? Number of Visits 25   ? Date for SLP Re-Evaluation 10/22/22   ? Authorization Type [employer] and Medicaid [place] Access   ? Authorization Time Period 04/29/2022 thru 10/22/2022   ? Authorization - Visit Number 1   ? Progress Note Due on Visit 10   ? SLP Start Time 0900   ? SLP Stop Time  1000   ? SLP Time Calculation (min) 60 min   ? Activity Tolerance Patient tolerated treatment well   ? ?  ?  ? ?  ? ? ?Past Medical History:  ?Diagnosis Date  ? Asthma   ? Lung cancer ([place])   ? ? ?Past Surgical History:  ?Procedure Laterality Date  ? CYST EXCISION    ? IR IMAGING GUIDED PORT INSERTION  12/05/2019  ? VIDEO BRONCHOSCOPY WITH ENDOBRONCHIAL ULTRASOUND Left 11/22/2019  ? Procedure: VIDEO BRONCHOSCOPY WITH ENDOBRONCHIAL ULTRASOUND;  Surgeon: Tyler Pita, MD;  Location: [facility] ORS;  Service: Thoracic;  Laterality: Left;  ? ? ?There were no vitals filed for this visit. ? ? Subjective Assessment - 04/30/22 1319   ? ? Subjective "I am really tired"   ? Currently in Pain? No/denies   ? ?  ?  ? ?  ? ? ? ? ? SLP Evaluation [facility] - 04/30/22 1319   ? ?  ? SLP Visit Information  ? SLP Received On 04/29/22   ? Referring Provider (SLP) Dr Randa Evens   ? Onset Date 04/15/2022   ? Medical Diagnosis Dysphagia d/t metastatic adenocarcinoma of the lung with disease progression around cervical spine   ?  ? General Information  ? HPI Patient is a 51 y.o. male with metastatic adenocarcinoma of the lung with disease progression around cervical spine. Specifically, MRI of the cervical spine  which showed a 4.6 x 4.8 x 8.2 cm mass in the left prevertebral region from C2-C7 levels.  The mass partially encases the left vertebral artery and abuts the preforaminal segment.  Invades the vertebral bodies and edema of the left C5 articular pillar.  There is slight extension into the left C4-C5 thecal sac without central thecal sac compression. Pt to receive palliative docetaxel treatments. Pt's appetite remains poor and is currently taking 10mg  olanzapine for appetite stimulation. Chart reveals that pt received 30 Gy to his left cervical spine with last treatment on 10/11/2021.   ? Behavioral/Cognition appropriate   ? Mobility Status ambulatory   ?  ? Balance Screen  ? Has the patient fallen in the past 6 months No   ? Has the patient had a decrease in activity level because of a fear of falling?  No   ? Is the patient reluctant to leave their home because of a fear of falling?  No   ?  ? Expression  ? Primary Mode of Expression Verbal   ?  ? Oral Motor/Sensory Function  ? Overall Oral Motor/Sensory Function Appears within functional limits for tasks assessed   ?  ? Motor Speech  ? Overall Motor Speech Impaired   ? Respiration Impaired   ?  Level of Impairment Sentence   ? Phonation Wet;Hoarse;Low vocal intensity   ? Resonance Within functional limits   ? Articulation Within functional limits   ? Intelligibility Intelligibility reduced   ? Word 75-100% accurate   ? Phrase 75-100% accurate   ? Sentence 75-100% accurate   75%  ? Motor Planning Within functional limits   ?  ? Standardized Assessments  ? Standardized Assessments  --   Clinical Swallow Evaluation  ? ?  ?  ? ?  ? ? ? ? ? ? ? ? ? ? ? ? ? ? ? ? ? ? SLP Education - 04/30/22 1325   ? ? Education Details s/s of dysphagia and aspiration, impact of radiation on muscles used with swallowing, Modified Barium Swallow Study   ? Person(s) Educated Patient   ? Methods Explanation;Demonstration;Verbal cues   ? Comprehension Verbalized understanding;Need further  instruction   ? ?  ?  ? ?  ? ? ? SLP Short Term Goals - 04/30/22 1332   ? ?  ? SLP SHORT TERM GOAL #1  ? Title Patient will independently utilize compensatory behaviors for  increased safety during swallowing and to reduce aspiration risk   ? Baseline new goal   ? Time 10   ? Period --   sessions  ? Status New   ? ?  ?  ? ?  ? ? ? SLP Long Term Goals - 04/30/22 1333   ? ?  ? SLP LONG TERM GOAL #1  ? Title Patient will demonstrate comprehension of presence of dysphagia, risks associated with aspiration, and rationale for adhering    to management recommendations   ? Baseline new goal   ? Time 12   ? Period Weeks   ? Status New   ? Target Date 07/23/22   ? ?  ?  ? ?  ? ? ? Plan - 04/30/22 1327   ? ? Clinical Impression Statement Pt referred for a clinical swallow evaluation on 04/15/2022 by his oncologist Dr Randa Evens d/t pt report of difficulty swallowing. Uncertain at this time if swallowing is secondary to any neural involvement in the cervical area. Pt presents today as thin and cachectic with chronic wet vocal quality that is c/b moderately hoarse vocal quality. He reports significant fatigue and as a result, he has a weak unproductive cough. No report of odynphagia or dysgeusia. When consuming thin liquids via cup, pt presents with significantly audible swallow, immediate throat clearing as well as wet burping. Pt states "it feels like my windpipe needs to move out of the way to let it go down." At this time, recommend a Modified Barium Swallow Study for assessment and management of potential effects of neoplasm and radiation might be having on pt's swallow.   ? Speech Therapy Frequency 1 x /week   ? Duration 12 weeks   ? Treatment/Interventions Aspiration precaution training;SLP instruction and feedback;Pharyngeal strengthening exercises;Diet toleration management by SLP;Trials of upgraded texture/liquids;Patient/family education;Oral motor exercises   ? Potential to [place]   ? Potential  Considerations Severity of impairments;Medical prognosis   ? Consulted and Agree with Plan of Care Patient   ? ?  ?  ? ?  ? ? ?Patient will benefit from skilled therapeutic intervention in order to improve the following deficits and impairments:   ?Dysphagia, oropharyngeal phase ? ?Malignant neoplasm of upper lobe of left lung ([place]) ? ?Malignant neoplasm of upper lobe of lung, unspecified laterality ([place]) ? ? ? ?Problem List ?  Patient Active Problem List  ? Diagnosis Date Noted  ? Spine metastasis 08/29/2021  ? Weakness of left arm 07/10/2020  ? Goals of care, counseling/discussion 11/29/2019  ? Malignant neoplasm of upper lobe of left lung ([place]) 11/29/2019  ? Non-small cell cancer of left lung ([place]) 11/29/2019  ? Hemoptysis 11/29/2019  ? ?Bowyn Mercier B. Rutherford Nail, M.S., CCC-SLP, CBIS ?Speech-Language Pathologist ?Rehabilitation Services ?Office [phone] ? ?Zabella Wease, CCC-SLP ?04/30/2022, 1:42 PM ? ?[facility] [place] [facility] ?[place], [place], [address] ?Phone: [phone]   Fax:  [phone] ? ?Name: Kyle Guerrero ?MRN: [id number] ?Date of Birth: 1971/04/23 ?

## 2022-05-02 ENCOUNTER — Ambulatory Visit: Payer: 59 | Admitting: Cardiology

## 2022-05-03 ENCOUNTER — Telehealth: Payer: Self-pay

## 2022-05-03 ENCOUNTER — Ambulatory Visit
Admission: RE | Admit: 2022-05-03 | Discharge: 2022-05-03 | Disposition: A | Discharge status: Home / Self Care | Payer: 59 | Source: Ambulatory Visit | Attending: Oncology | Admitting: Oncology

## 2022-05-03 ENCOUNTER — Encounter: Payer: Self-pay | Admitting: Cardiology

## 2022-05-03 DIAGNOSIS — R29898 Other symptoms and signs involving the musculoskeletal system: Secondary | ICD-10-CM | POA: Diagnosis not present

## 2022-05-03 DIAGNOSIS — C3412 Malignant neoplasm of upper lobe, left bronchus or lung: Secondary | ICD-10-CM | POA: Diagnosis not present

## 2022-05-03 DIAGNOSIS — S143XXD Injury of brachial plexus, subsequent encounter: Secondary | ICD-10-CM | POA: Diagnosis not present

## 2022-05-03 DIAGNOSIS — I5020 Unspecified systolic (congestive) heart failure: Secondary | ICD-10-CM

## 2022-05-03 DIAGNOSIS — M79602 Pain in left arm: Secondary | ICD-10-CM | POA: Diagnosis not present

## 2022-05-03 DIAGNOSIS — X58XXXD Exposure to other specified factors, subsequent encounter: Secondary | ICD-10-CM | POA: Diagnosis not present

## 2022-05-03 DIAGNOSIS — M6281 Muscle weakness (generalized): Secondary | ICD-10-CM | POA: Diagnosis not present

## 2022-05-03 NOTE — Telephone Encounter (Signed)
Sent Patient a MyChart message with his instructions as requested by him.  ? ?Will route to scheduling to call patient and schedule the Limited Echo and follow up in 6 months. ?

## 2022-05-03 NOTE — Telephone Encounter (Signed)
-----   Message from Kate Sable, MD sent at 05/02/2022  5:15 PM EDT ----- ?Continue medications as prescribed, repeat limited echo in 6 months.  Follow-up after limited echo in 6 months.  Thank you ?

## 2022-05-03 NOTE — Telephone Encounter (Signed)
LMOV to schedule  

## 2022-05-04 NOTE — Progress Notes (Signed)
Modified Barium Swallow Progress Note ? ?Patient Details  ?Name: Kyle Guerrero ?MRN: [id number] ?Date of Birth: 1971-10-30 ? ?Today's Date: 05/04/2022 ? ?Modified Barium Swallow completed.  Full report located under Chart Review in the Imaging Section. ? ?Brief recommendations include the following: ? ?Clinical Impression ? Pt presents with moderate pharyngoesophageal dysphagia.  Cervical spine fusion hardware is noted. As such, penetration and aspiration is confirmed with pt in oblique position. Pt's esophagus is grossly patent. This prevents him from generating sufficient pressure required to propel boluses thru the pharynx. As a result, pt produces multiple swallows per bolus with moderate pharyngeal residue remaining after each swallow. While pt does intermittently aspirate before the swallowing, he consistently penetrates and aspiration pharyngeal residue post swallow. All aspiration is silent. However, when cued to cough, pt produces a strong cough that expels ALL aspirates. While pt is at a high risk of aspirating, he was able to return demonstrate sip, swallow, cough over 10 trials after the study. Pt also demonstrated good comprehension of aspiration and the need to utilize a cough to reduce risk of aspiration. At this time, recommend pt continue a regular diet with thin liquids via cup using strategy SIP, SWALLOW, COUGH. Should pt experience any respiratory decline, recommend re-evaluation to assess for potential diet modification at that time. ?  ?Swallow Evaluation Recommendations ? ?   ? ? SLP Diet Recommendations: Regular solids;Thin liquid ? ? Liquid Administration via: Cup ? ? Medication Administration: Whole meds with liquid ? ? Supervision: Patient able to self feed ? ? Compensations: Minimize environmental distractions;Slow rate;Small sips/bites ? ? Postural Changes: Seated upright at 90 degrees ? ? Oral Care Recommendations: Oral care BID ? ?   ? ?Deanndra Kirley B. Rutherford Nail, M.S., CCC-SLP,  CBIS ?Speech-Language Pathologist ?Rehabilitation Services ?Office [phone] ? ? ?Joycelynn Fritsche ?05/04/2022,2:35 PM ?

## 2022-05-05 MED FILL — Dexamethasone Sodium Phosphate Inj 100 MG/10ML: INTRAMUSCULAR | Qty: 1 | Status: AC

## 2022-05-06 ENCOUNTER — Inpatient Hospital Stay: Payer: 59

## 2022-05-06 ENCOUNTER — Telehealth: Payer: Self-pay | Admitting: Oncology

## 2022-05-06 ENCOUNTER — Inpatient Hospital Stay: Payer: 59 | Admitting: Oncology

## 2022-05-06 NOTE — Telephone Encounter (Signed)
Scheduled

## 2022-05-06 NOTE — Telephone Encounter (Signed)
Spoke with patient to confirm appointment time on 5/19. He was agreeable.  ?

## 2022-05-09 ENCOUNTER — Inpatient Hospital Stay: Payer: 59

## 2022-05-10 ENCOUNTER — Inpatient Hospital Stay: Payer: 59 | Attending: Oncology

## 2022-05-10 NOTE — Progress Notes (Signed)
Nutrition Follow-up: ? ?Patient with recurrent metastatic adenocarcinoma of lung.  Large tumor found in cervical spine with mass displacing the left pharynx, s/p surgery.  Patient on third line treatment of docetaxel.   ? ?Spoke with patient via phone.  Patient reports that appetite is no better.  Says that he has pain in his back and restless at night.  "I can't get no sleep."  Ate [place] [employer] sub last night (4inch) for supper.  Eating cereal now.  "I just got up."   ? ?Noted SLP recommendation of regular diet with thin liquids and cough.   ? ? ? ?Medications: reviewed ? ?Labs: reviewed ? ?Anthropometrics:  ? ?Weight 133 lb 4 /21 ?146 lb on 3/10 ?149 lb on 1/26 ?150 lb on 1/11 ?144 lb on 11/10 ? ? ?NUTRITION DIAGNOSIS: Inadequate oral intake ongoing ? ? ?INTERVENTION:  ?Patient may benefit from trial of appetite stimulant.  Message sent to MD ?Encouraged patient to discuss pain and inability to sleep with MD at visit on 5/19 ?Encouraged patient to push calories and protein ?Continue oral nutrition supplements ?  ? ?MONITORING, EVALUATION, GOAL: weight trends, intake ? ? ?NEXT VISIT: as needed ? ? ?Minas Bonser B. Zenia Resides, RD, LDN ?Registered Dietitian ?[phone] ? ? ?

## 2022-05-12 MED FILL — Dexamethasone Sodium Phosphate Inj 100 MG/10ML: INTRAMUSCULAR | Qty: 1 | Status: AC

## 2022-05-13 ENCOUNTER — Inpatient Hospital Stay: Payer: 59 | Attending: Oncology

## 2022-05-13 ENCOUNTER — Inpatient Hospital Stay (HOSPITAL_BASED_OUTPATIENT_CLINIC_OR_DEPARTMENT_OTHER): Payer: 59 | Admitting: Oncology

## 2022-05-13 ENCOUNTER — Inpatient Hospital Stay: Payer: 59

## 2022-05-13 ENCOUNTER — Encounter: Payer: Self-pay | Admitting: Oncology

## 2022-05-13 ENCOUNTER — Other Ambulatory Visit: Payer: Self-pay | Admitting: Oncology

## 2022-05-13 VITALS — BP 133/95 | HR 133 | Temp 98.2°F | Ht 71.0 in | Wt 128.5 lb

## 2022-05-13 DIAGNOSIS — R221 Localized swelling, mass and lump, neck: Secondary | ICD-10-CM | POA: Diagnosis not present

## 2022-05-13 DIAGNOSIS — C3412 Malignant neoplasm of upper lobe, left bronchus or lung: Secondary | ICD-10-CM | POA: Insufficient documentation

## 2022-05-13 DIAGNOSIS — Z87891 Personal history of nicotine dependence: Secondary | ICD-10-CM | POA: Insufficient documentation

## 2022-05-13 DIAGNOSIS — M542 Cervicalgia: Secondary | ICD-10-CM | POA: Diagnosis not present

## 2022-05-13 DIAGNOSIS — G893 Neoplasm related pain (acute) (chronic): Secondary | ICD-10-CM

## 2022-05-13 DIAGNOSIS — C7951 Secondary malignant neoplasm of bone: Secondary | ICD-10-CM | POA: Insufficient documentation

## 2022-05-13 DIAGNOSIS — C782 Secondary malignant neoplasm of pleura: Secondary | ICD-10-CM | POA: Insufficient documentation

## 2022-05-13 LAB — CBC WITH DIFFERENTIAL/PLATELET
Abs Immature Granulocytes: 0.02 10*3/uL (ref 0.00–0.07)
Basophils Absolute: 0 10*3/uL (ref 0.0–0.1)
Basophils Relative: 1 %
Eosinophils Absolute: 0.1 10*3/uL (ref 0.0–0.5)
Eosinophils Relative: 2 %
HCT: 32.4 % — ABNORMAL LOW (ref 39.0–52.0)
Hemoglobin: 10.6 g/dL — ABNORMAL LOW (ref 13.0–17.0)
Immature Granulocytes: 0 %
Lymphocytes Relative: 16 %
Lymphs Abs: 1.2 10*3/uL (ref 0.7–4.0)
MCH: 27.7 pg (ref 26.0–34.0)
MCHC: 32.7 g/dL (ref 30.0–36.0)
MCV: 84.6 fL (ref 80.0–100.0)
Monocytes Absolute: 1 10*3/uL (ref 0.1–1.0)
Monocytes Relative: 13 %
Neutro Abs: 5.3 10*3/uL (ref 1.7–7.7)
Neutrophils Relative %: 68 %
Platelets: 654 10*3/uL — ABNORMAL HIGH (ref 150–400)
RBC: 3.83 MIL/uL — ABNORMAL LOW (ref 4.22–5.81)
RDW: 15.3 % (ref 11.5–15.5)
WBC: 7.7 10*3/uL (ref 4.0–10.5)
nRBC: 0 % (ref 0.0–0.2)

## 2022-05-13 LAB — COMPREHENSIVE METABOLIC PANEL
ALT: 23 U/L (ref 0–44)
AST: 24 U/L (ref 15–41)
Albumin: 3.4 g/dL — ABNORMAL LOW (ref 3.5–5.0)
Alkaline Phosphatase: 99 U/L (ref 38–126)
Anion gap: 10 (ref 5–15)
BUN: 11 mg/dL (ref 6–20)
CO2: 27 mmol/L (ref 22–32)
Calcium: 9 mg/dL (ref 8.9–10.3)
Chloride: 92 mmol/L — ABNORMAL LOW (ref 98–111)
Creatinine, Ser: 0.86 mg/dL (ref 0.61–1.24)
GFR, Estimated: >60 mL/min (ref >60)
Glucose, Bld: 145 mg/dL — ABNORMAL HIGH (ref 70–99)
Potassium: 3.4 mmol/L — ABNORMAL LOW (ref 3.5–5.1)
Sodium: 129 mmol/L — ABNORMAL LOW (ref 135–145)
Total Bilirubin: 0.6 mg/dL (ref 0.3–1.2)
Total Protein: 8.1 g/dL (ref 6.5–8.1)

## 2022-05-13 MED ORDER — DEXAMETHASONE 4 MG PO TABS: 4.0000 mg | ORAL_TABLET | Freq: Two times a day (BID) | ORAL | 0 refills | Status: AC

## 2022-05-13 MED ORDER — HEPARIN SOD (PORK) LOCK FLUSH 100 UNIT/ML IV SOLN
500.0000 [IU] | Freq: Once | INTRAVENOUS | Status: AC
  Administered 2022-05-13: 500 [IU] via INTRAVENOUS
  Filled 2022-05-13: qty 5

## 2022-05-13 NOTE — Progress Notes (Signed)
Hematology/Oncology Consult note [facility]  Telephone:([phone] [phone]  Patient Care Team: Pcp, No as PCP - General Kate Sable, MD as PCP - Cardiology (Cardiology) Telford Nab, RN as Registered Nurse Sindy Guadeloupe, MD as Consulting Physician (Hematology and Oncology) Sindy Guadeloupe, MD as Consulting Physician (Hematology and Oncology)   Name of the patient: Kyle Guerrero  [id number]  [birth year]   Date of visit: 05/13/22  Diagnosis- Non-small cell lung cancer stage IV acT2 cN2 cM1 a with pleural involvement as well as large cervical neck mass  Chief complaint/ Reason for visit-on treatment assessment prior to cycle 2 of docetaxel chemotherapy  Heme/Onc history: patient is a 51 year old male who presented to the ER with symptoms of heaviness in his chest and upper left chest discomfort he underwent CT angio chest to rule out PE which showed 3.8 x 3.3 cm left upper palpable lung mass along with 4.4 x 3.3 cm lobulated mass in the aortopulmonary window and 2.6 x 2.4 cm left hilar mass all concerning for malignancy.  Patient has also seen pulmonary and has been set up for bronchoscopy and EBUS guided biopsy on 11/23/2019.  Patient underwent.  PET CT scan which showed a hypermetabolic spiculated 3.5 cm of 5 left upper lobe lung mass, adjacent hypermetabolic 3.2 x 1.2 cm pleural metastases in the medial posterior by the left pleural space along with scalloping of the adjacent posterior left third rib.  Hypermetabolic infiltrative left perihilar conglomerate nodal metastases measuring up to 7.3 x 3.6 cm and 0.8 cm high left mediastinal node between the left brachiocephalic vein and left subclavian artery.  No evidence of distant metastatic disease   Biopsy showed non-small cell lung cancer but further characterization could not be determined.  Insufficient tissue for NGS testing. Repeat biopsy done. Results of NGS testing showed PD-L1 50%.   Tumor mutational burden high.  ERBB2 copy number again. 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    Patient completed concurrent chemoradiation with carbotaxol chemotherapy followed by 2 cycles of carbotaxol Keytruda and was on maintenance Keytruda   Patient was complaining of left shoulder pain and underwent MRI of the shoulder which showed tear in the supraspinatus tendon.  He was subsequently seen by [facility] and underwent MRI of the cervical spine which showed a 4.6 x 4.8 x 8.2 cm mass in the left prevertebral region from C2-C7 levels.  The mass partially encases the left vertebral artery and abuts the preforaminal segment.  Invades the vertebral bodies and edema of the left C5 articular pillar.  There is slight extension into the left C4-C5 thecal sac without central thecal sac compression.  The mass displaces the left pharynx anteriorly along the left carotid sheath.  Patient had a repeat biopsy which was consistent with adenocarcinoma.   Patient underwent palliative radiation treatment to his neck mass and completed 4 cycles of carboplatin and [name] with excellent response to treatment and is currently on maintenance [name].  Repeat NGS testing was also performed which did not show any evidence of actionable mutations other than ERBB2 gain   Patient had recurrence of his neck pain and repeat imaging showedInterim increase in the size of his neck mass and patient went for second opinion for both medical oncology as well as neurosurgery continue current was decided to cooperate upon this mass for definitive resection.  Patient underwent resection of cervical tumor which showed poorly differentiated metastatic adenocarcinoma on 08/29/2021.  TTF-1 negative.   Patient  received 3-4 cycles of Enhertu but had a drop in his ejection fraction which did not recover.  Scan showed evidence of progression and patient was switched to docetaxel  Interval history-patient is doing poorly overall.   Appetite is poor and he is losing weight.Also reports worsening pain now in his right upper extremity for the last 2 weeks.  His prior problems have all been in his left upper extremity.  He reports pain and inability to raise his arm overhead.  ECOG PS- 2 Pain scale- 8 Opioid associated constipation- no  Review of systems- Review of Systems  Constitutional:  Positive for malaise/fatigue and weight loss. Negative for chills and fever.  HENT:  Negative for congestion, ear discharge and nosebleeds.   Eyes:  Negative for blurred vision.  Respiratory:  Negative for cough, hemoptysis, sputum production, shortness of breath and wheezing.   Cardiovascular:  Negative for chest pain, palpitations, orthopnea and claudication.  Gastrointestinal:  Negative for abdominal pain, blood in stool, constipation, diarrhea, heartburn, melena, nausea and vomiting.  Genitourinary:  Negative for dysuria, flank pain, frequency, hematuria and urgency.  Musculoskeletal:  Negative for back pain, joint pain and myalgias.       Right arm pain  Skin:  Negative for rash.  Neurological:  Negative for dizziness, tingling, focal weakness, seizures, weakness and headaches.  Endo/Heme/Allergies:  Does not bruise/bleed easily.  Psychiatric/Behavioral:  Negative for depression and suicidal ideas. The patient does not have insomnia.      No Known Allergies   Past Medical History:  Diagnosis Date   Asthma    Lung cancer ([place])      Past Surgical History:  Procedure Laterality Date   CYST EXCISION     IR IMAGING GUIDED PORT INSERTION  12/05/2019   VIDEO BRONCHOSCOPY WITH ENDOBRONCHIAL ULTRASOUND Left 11/22/2019   Procedure: VIDEO BRONCHOSCOPY WITH ENDOBRONCHIAL ULTRASOUND;  Surgeon: Tyler Pita, MD;  Location: [facility] ORS;  Service: Thoracic;  Laterality: Left;    Social History   Socioeconomic History   Marital status: Single    Spouse name: Not on file   Number of children: Not on file   Years of education:  Not on file   Highest education level: Not on file  Occupational History   Not on file  Tobacco Use   Smoking status: Former    Packs/day: 2.00    Types: Cigarettes    Quit date: 11/08/2019    Years since quitting: 2.5   Smokeless tobacco: Never  Vaping Use   Vaping Use: Never used  Substance and Sexual Activity   Alcohol use: Not Currently   Drug use: Yes    Types: Marijuana   Sexual activity: Not Currently  Other Topics Concern   Not on file  Social History Narrative   Not on file   Social Determinants of Health   Financial Resource Strain: Not on file  Food Insecurity: Not on file  Transportation Needs: Not on file  Physical Activity: Not on file  Stress: Not on file  Social Connections: Not on file  Intimate Partner Violence: Not on file    Family History  Problem Relation Age of Onset   Healthy Mother    Healthy Father      Current Outpatient Medications:    baclofen (LIORESAL) 10 MG tablet, TAKE 1 TABLET BY MOUTH THREE TIMES A DAY, Disp: 270 tablet, Rfl: 1   docusate sodium (COLACE) 100 MG capsule, Take 100 mg by mouth daily., Disp: , Rfl:    DULoxetine (  CYMBALTA) 30 MG capsule, Take 1 capsule (30 mg total) by mouth daily for 7 days, THEN 2 capsules (60 mg total) daily for 23 days., Disp: 53 capsule, Rfl: 0   fentaNYL (DURAGESIC) 12 MCG/HR, Place 1 patch onto the skin every 3 (three) days., Disp: 10 patch, Rfl: 0   lidocaine-prilocaine (EMLA) cream, Apply to affected area once, Disp: 30 g, Rfl: 3   losartan (COZAAR) 25 MG tablet, Take 1 tablet (25 mg total) by mouth daily., Disp: 30 tablet, Rfl: 3   metoprolol succinate (TOPROL XL) 25 MG 24 hr tablet, Take 1 tablet (25 mg total) by mouth daily., Disp: 30 tablet, Rfl: 5   oxyCODONE (OXY IR/ROXICODONE) 5 MG immediate release tablet, Take 1 tablet (5 mg total) by mouth every 4 (four) hours as needed for moderate pain or severe pain., Disp: 60 tablet, Rfl: 0   LORazepam (ATIVAN) 0.5 MG tablet, Take 1 tablet (0.5 mg  total) by mouth every 6 (six) hours as needed (Nausea or vomiting). (Patient not taking: Reported on 04/15/2022), Disp: 30 tablet, Rfl: 0   OLANZapine (ZYPREXA) 10 MG tablet, TAKE 1 TABLET BY MOUTH EVERYDAY AT BEDTIME, Disp: 90 tablet, Rfl: 1   ondansetron (ZOFRAN) 8 MG tablet, Take 1 tablet (8 mg total) by mouth 2 (two) times daily as needed for refractory nausea / vomiting. (Patient not taking: Reported on 04/15/2022), Disp: 30 tablet, Rfl: 1   prochlorperazine (COMPAZINE) 10 MG tablet, Take 1 tablet (10 mg total) by mouth every 6 (six) hours as needed (Nausea or vomiting). (Patient not taking: Reported on 04/15/2022), Disp: 30 tablet, Rfl: 1 No current facility-administered medications for this visit.  Facility-Administered Medications Ordered in Other Visits:    heparin lock flush 100 unit/mL, 500 Units, Intravenous, Once, Sindy Guadeloupe, MD   heparin lock flush 100 unit/mL, 500 Units, Intravenous, Once, Sindy Guadeloupe, MD   sodium chloride flush (NS) 0.9 % injection 10 mL, 10 mL, Intravenous, PRN, Sindy Guadeloupe, MD, 10 mL at 05/01/20 0900   sodium chloride flush (NS) 0.9 % injection 10 mL, 10 mL, Intravenous, PRN, Sindy Guadeloupe, MD, 10 mL at 01/29/21 0835  Physical exam:  Vitals:   05/13/22 1035 05/13/22 1048  BP: (!) 133/95   Pulse: (!) 133 (!) 133  Temp: 98.2 F (36.8 C)   TempSrc: Oral   Weight: 128 lb 8 oz (58.3 kg)   Height: _0  (1.803 m)    Physical Exam Constitutional:      Comments: Looks frail and fatigued  Cardiovascular:     Rate and Rhythm: Regular rhythm. Tachycardia present.     Heart sounds: Normal heart sounds.  Pulmonary:     Effort: Pulmonary effort is normal.     Breath sounds: Normal breath sounds.  Abdominal:     General: Bowel sounds are normal.     Palpations: Abdomen is soft.  Skin:    General: Skin is warm and dry.  Neurological:     Mental Status: He is alert and oriented to person, place, and time.        Latest Ref Rng & Units 05/13/2022     9:46 AM  CMP  Glucose 70 - 99 mg/dL 145    BUN 6 - 20 mg/dL 11    Creatinine 0.61 - 1.24 mg/dL 0.86    Sodium 135 - 145 mmol/L 129    Potassium 3.5 - 5.1 mmol/L 3.4    Chloride 98 - 111 mmol/L 92    CO2  22 - 32 mmol/L 27    Calcium 8.9 - 10.3 mg/dL 9.0    Total Protein 6.5 - 8.1 g/dL 8.1    Total Bilirubin 0.3 - 1.2 mg/dL 0.6    Alkaline Phos 38 - 126 U/L 99    AST 15 - 41 U/L 24    ALT 0 - 44 U/L 23        Latest Ref Rng & Units 05/13/2022    9:46 AM  CBC  WBC 4.0 - 10.5 K/uL 7.7    Hemoglobin 13.0 - 17.0 g/dL 10.6    Hematocrit 39.0 - 52.0 % 32.4    Platelets 150 - 400 K/uL 654      No images are attached to the encounter.  Leanne Chang SPEECH PATH  Result Date: 05/04/2022 Table formatting from the original result was not included. Images from the original result were not included. Objective Swallowing Evaluation: Type of Study: MBS-Modified Barium Swallow Study  Patient Details Name: DEANGLEO PASSAGE MRN: [id number] Date of Birth: December 08, 1971 Today's Date: 05/04/2022 Time: SLP Start Time (ACUTE ONLY): 1510 -SLP Stop Time (ACUTE ONLY): 1545 SLP Time Calculation (min) (ACUTE ONLY): 35 min Past Medical History: Past Medical History: Diagnosis Date  Asthma   Lung cancer ([place])  Past Surgical History: Past Surgical History: Procedure Laterality Date  CYST EXCISION    IR IMAGING GUIDED PORT INSERTION  12/05/2019  VIDEO BRONCHOSCOPY WITH ENDOBRONCHIAL ULTRASOUND Left 11/22/2019  Procedure: VIDEO BRONCHOSCOPY WITH ENDOBRONCHIAL ULTRASOUND;  Surgeon: Tyler Pita, MD;  Location: [facility] ORS;  Service: Thoracic;  Laterality: Left; HPI: Patient is a 51 y.o. male with metastatic adenocarcinoma of the lung with disease progression around cervical spine. Specifically, MRI of the cervical spine which showed a 4.6 x 4.8 x 8.2 cm mass in the left prevertebral region from C2-C7 levels.  The mass partially encases the left vertebral artery and abuts the preforaminal segment.  Invades the vertebral  bodies and edema of the left C5 articular pillar.  There is slight extension into the left C4-C5 thecal sac without central thecal sac compression. Pt to receive palliative docetaxel treatments. Pt's appettiate remains poor and is currently taking 64m olanzapine for appetite stimulation. Chart reveals that pt received 30 Gy to his left cervical spine with last treatment on 10/11/2021.  Subjective: pt pleasant, conversant, known to this writer  Recommendations for follow up therapy are one component of a multi-disciplinary discharge planning process, led by the attending physician.  Recommendations may be updated based on patient status, additional functional criteria and insurance authorization. Assessment / Plan / Recommendation   05/04/2022   2:00 PM Clinical Impressions Clinical Impression Pt presents with moderate pharyngoesophageal dysphagia.  Cervical spine fusion hardware is noted. As such, penetration and aspiration is confirmed with pt in oblique position. Pt's esophagus is grossly patent. This prevents him from generating sufficient pressure required to propell boluses thru the pharynx. As a result, pt produces multiple swallows per bolus with moderate pharyngeal residue remaining after each swallow. While pt does intermittently aspirate before the swallowing, he consistently penetrates and aspiration pharyngeal residue post swallow. All aspiration is silent. However, when cued to cough, pt produces a strong cough that expells ALL aspirates. While pt is at a high risk of aspirating, he was able to return demonstrate sip, swallow, cough over 10 trials after the study. Pt also demontrated good comprehension of aspiration and the need to utilize a cough to reduce risk of aspiration. At this time, recommend pt continue a  regular diet with thin liquids via cup using strategy SIP, SWALLOW, COUGH. Should pt experience any respiratory decline, recommend re-evaluation to assess for potential diet modification at  that time. SLP Visit Diagnosis Dysphagia, pharyngoesophageal phase (R13.14) Impact on safety and function Moderate aspiration risk     05/04/2022   2:00 PM Treatment Recommendations Treatment Recommendations No treatment recommended at this time      View : No data to display.      05/04/2022   2:00 PM Diet Recommendations SLP Diet Recommendations Regular solids;Thin liquid Liquid Administration via Cup Medication Administration Whole meds with liquid Compensations Minimize environmental distractions;Slow rate;Small sips/bites Postural Changes Seated upright at 90 degrees     05/04/2022   2:00 PM Other Recommendations Oral Care Recommendations Oral care BID Follow Up Recommendations No SLP follow up    View : No data to display.        05/04/2022   2:00 PM Oral Phase Oral Phase [facility]    05/04/2022   2:00 PM Pharyngeal Phase Pharyngeal Phase Impaired Pharyngeal- Nectar Teaspoon Reduced pharyngeal peristalsis;Reduced epiglottic inversion;Reduced anterior laryngeal mobility;Reduced laryngeal elevation;Reduced airway/laryngeal closure;Penetration/Aspiration before swallow;Penetration/Apiration after swallow;Pharyngeal residue - valleculae;Pharyngeal residue - pyriform;Pharyngeal residue - posterior pharnyx Pharyngeal Material enters airway, remains ABOVE vocal cords and not ejected out Pharyngeal- Nectar Cup Reduced pharyngeal peristalsis;Reduced epiglottic inversion;Reduced anterior laryngeal mobility;Reduced laryngeal elevation;Reduced airway/laryngeal closure;Penetration/Aspiration before swallow;Penetration/Apiration after swallow;Pharyngeal residue - valleculae;Pharyngeal residue - pyriform;Pharyngeal residue - posterior pharnyx Pharyngeal Material enters airway, remains ABOVE vocal cords and not ejected out;Material enters airway, passes BELOW cords without attempt by patient to eject out (silent aspiration) Pharyngeal- Thin Teaspoon Reduced pharyngeal peristalsis;Reduced epiglottic inversion;Reduced anterior laryngeal  mobility;Reduced laryngeal elevation;Reduced airway/laryngeal closure;Penetration/Aspiration before swallow;Penetration/Apiration after swallow;Pharyngeal residue - valleculae;Pharyngeal residue - pyriform;Pharyngeal residue - posterior pharnyx Pharyngeal Material enters airway, passes BELOW cords without attempt by patient to eject out (silent aspiration) Pharyngeal- Thin Cup Reduced pharyngeal peristalsis;Reduced epiglottic inversion;Reduced anterior laryngeal mobility;Reduced laryngeal elevation;Reduced airway/laryngeal closure;Penetration/Aspiration before swallow;Penetration/Apiration after swallow;Pharyngeal residue - valleculae;Pharyngeal residue - pyriform;Pharyngeal residue - posterior pharnyx Pharyngeal Material enters airway, remains ABOVE vocal cords and not ejected out;Material enters airway, passes BELOW cords without attempt by patient to eject out (silent aspiration) Pharyngeal- Puree Reduced pharyngeal peristalsis;Reduced epiglottic inversion;Reduced anterior laryngeal mobility;Reduced laryngeal elevation;Reduced airway/laryngeal closure;Penetration/Aspiration before swallow;Penetration/Apiration after swallow;Pharyngeal residue - valleculae;Pharyngeal residue - pyriform;Pharyngeal residue - posterior pharnyx Pharyngeal Material enters airway, remains ABOVE vocal cords and not ejected out;Material enters airway, passes BELOW cords without attempt by patient to eject out (silent aspiration) Pharyngeal- Pill [facility]    05/04/2022   2:00 PM Cervical Esophageal Phase  Cervical Esophageal Phase Impaired Nectar Teaspoon Esophageal backflow into the pharynx Nectar Cup Esophageal backflow into cervical esophagus;Esophageal backflow into the pharynx Thin Teaspoon Esophageal backflow into cervical esophagus;Esophageal backflow into the pharynx Thin Cup Esophageal backflow into cervical esophagus;Esophageal backflow into the pharynx Puree Esophageal backflow into cervical esophagus;Esophageal backflow into the  pharynx Pill -- Happi B. Rutherford Nail M.S., CCC-SLP, [place] Office [phone] Stormy Fabian 05/04/2022, 2:37 PM                     ECHOCARDIOGRAM LIMITED  Result Date: 04/26/2022    ECHOCARDIOGRAM LIMITED REPORT   Patient Name:   MENACHEM URBANEK Date of Exam: 04/26/2022 Medical Rec #:  [id number]         Height:       71.0 in Accession #:    [id number]        Weight:  133.9 lb Date of Birth:  June 18, 1971         BSA:          1.778 m Patient Age:    68 years          BP:           104/74 mmHg Patient Gender: M                 HR:           92 bpm. Exam Location:  [place] Procedure: Limited Echo and Limited Color Doppler Indications:    I50.20* Unspecified systolic (congestive) heart failure  History:        Patient has prior history of Echocardiogram examinations, most                 recent 02/09/2022. CHF. H/o non-small cell lung cancer, stage IV                 Chemotherapy and radiation.  Sonographer:    Pilar Jarvis RDMS, RVT, RDCS Referring Phys: [id number] [facility]  Sonographer Comments: Technically challenging study due to limited acoustic windows. IMPRESSIONS  1. Left ventricular ejection fraction, by estimation, is 45 to 50%. The left ventricle has mildly decreased function. The left ventricle demonstrates global hypokinesis. Left ventricular diastolic parameters are consistent with Grade I diastolic dysfunction (impaired relaxation). The average left ventricular global longitudinal strain is -9.1 %.  2. Right ventricular systolic function is normal. The right ventricular size is normal. Tricuspid regurgitation signal is inadequate for assessing PA pressure.  3. The mitral valve is normal in structure. No evidence of mitral valve regurgitation. No evidence of mitral stenosis.  4. The aortic valve is normal in structure. Aortic valve regurgitation is not visualized. No aortic stenosis is present.  5. The inferior vena cava is normal in size with  greater than 50% respiratory variability, suggesting right atrial pressure of 3 mmHg. FINDINGS  Left Ventricle: Left ventricular ejection fraction, by estimation, is 45 to 50%. The left ventricle has mildly decreased function. The left ventricle demonstrates global hypokinesis. The average left ventricular global longitudinal strain is -9.1 %. The  left ventricular internal cavity size was normal in size. There is no left ventricular hypertrophy. Left ventricular diastolic parameters are consistent with Grade I diastolic dysfunction (impaired relaxation). Right Ventricle: The right ventricular size is normal. No increase in right ventricular wall thickness. Right ventricular systolic function is normal. Tricuspid regurgitation signal is inadequate for assessing PA pressure. Left Atrium: Left atrial size was normal in size. Right Atrium: Right atrial size was normal in size. Pericardium: There is no evidence of pericardial effusion. Mitral Valve: The mitral valve is normal in structure. No evidence of mitral valve stenosis. Tricuspid Valve: The tricuspid valve is normal in structure. Tricuspid valve regurgitation is not demonstrated. No evidence of tricuspid stenosis. Aortic Valve: The aortic valve is normal in structure. Aortic valve regurgitation is not visualized. No aortic stenosis is present. Pulmonic Valve: The pulmonic valve was normal in structure. Pulmonic valve regurgitation is not visualized. No evidence of pulmonic stenosis. Aorta: The aortic root is normal in size and structure. Venous: The inferior vena cava is normal in size with greater than 50% respiratory variability, suggesting right atrial pressure of 3 mmHg. IAS/Shunts: No atrial level shunt detected by color flow Doppler. LEFT VENTRICLE PLAX 2D LVIDd:         4.50 cm      Diastology LVIDs:  3.40 cm      LV e' medial:    9.90 cm/s LV PW:         1.00 cm      LV E/e' medial:  4.4 LV IVS:        0.80 cm      LV e' lateral:   7.40 cm/s                              LV E/e' lateral: 5.9  LV Volumes (MOD)            2D Longitudinal Strain LV vol d, MOD A2C: 95.2 ml  2D Strain GLS Avg:     -9.1 % LV vol d, MOD A4C: 100.0 ml LV vol s, MOD A2C: 56.3 ml LV vol s, MOD A4C: 59.4 ml LV SV MOD A2C:     38.9 ml LV SV MOD A4C:     100.0 ml LV SV MOD BP:      40.1 ml RIGHT VENTRICLE RV S prime:     10.30 cm/s MITRAL VALVE MV Area (PHT): 3.01 cm MV Decel Time: 252 msec MV E velocity: 43.80 cm/s MV A velocity: 52.20 cm/s MV E/A ratio:  0.84 Ida Rogue MD Electronically signed by Ida Rogue MD Signature Date/Time: 04/26/2022/1:24:53 PM    Final      Assessment and plan- Patient is a 51 y.o. male  with metastatic adenocarcinoma of the lung with disease progression around cervical spine.  He is here for on treatment assessment prior to cycle 2 of docetaxel chemotherapy  Patient does not have any systemic disease below the thoracic inlet.  His main area of disease has been revolving around the cervical spine.  PreviouslyResulted in significant left upper extremity weakness and a large paravertebral mass involving C4-C5 and C6 on the left side.  He initially underwent palliative radiation for this.  He had progression again in this area and underwent surgery at [facility] followed by a second round of radiation.  His most recent PET scan in March 2023 showed progressive disease in the lower neck around the prevertebral space with extension anteriorly and rightward.  He was therefore started on docetaxel chemotherapy and has received 1 dose so far.  Presently patient complains of worsening pain in his right upper extremity and I am concerned that there is progressive spread of disease involving his cervical spine now on the right side.  His pain is not adequately controlled at this time.  We discussed the following options moving forward  Proceeding with inpatient hospitalization preferably at [facility] since he has had prior surgery for his cervical spine there versus  hospitalization at [facility] If the patient absolutely declines hospitalization then I will get an MRI of his cervical thoracic and lumbar spine as an outpatient while continuing to work on his pain management.  Patient is agreeable to getting hospitalized but does not want to do so until Monday morning.  I am holding off on MRI spine imaging at this time but he knows that he would be going to [facility] if he does not feel better over the weekend but latest by Monday morning.  I am also sending a prescription for Decadron 4 mg twice daily in the interim and increasing his fentanyl patch to 25 mcg.  He will continue as needed oxycodone which we will titrate as needed.  Suspect sinus tachycardia secondary to uncontrolled pain.  He is not short of  breath and suspicion for PE is low.  However he will require repeat imaging when he gets admitted to [facility] as well.  He does not wish to receive any IV fluids today.  I am holding off on giving him chemotherapy today.  Follow-up to be decided based on what patient decides on Monday   Visit Diagnosis 1. Neoplasm related pain   2. Malignant neoplasm of upper lobe of left lung ([place])      Dr. Randa Evens, MD, MPH [facility] [phone] 05/13/2022 4:05 PM

## 2022-05-13 NOTE — Progress Notes (Signed)
Pt right shoulder is not worse that his left. Has trouble eating but seen SLP and he was told to cough after each bite and it goes down., he can't sleep due to the pain. He eats 2 good meals a day and protein shake a day. He has high b/p probably by some of the pain

## 2022-05-17 ENCOUNTER — Telehealth: Payer: Self-pay | Admitting: *Deleted

## 2022-05-17 DIAGNOSIS — M5489 Other dorsalgia: Secondary | ICD-10-CM | POA: Diagnosis not present

## 2022-05-17 DIAGNOSIS — R531 Weakness: Secondary | ICD-10-CM | POA: Diagnosis not present

## 2022-05-17 NOTE — Telephone Encounter (Signed)
Pt left message stating that he is waiting to be admitted at [facility] due to worsening right arm pain. Spoke with Dr. Elroy Channel clinical team who advised that pt needs to be evaluated at the ER at [facility] to be admitted quickly. Pt has been made aware and verbalized understanding.

## 2022-05-17 NOTE — Telephone Encounter (Signed)
Hayley can you f/u with him tomorrow and make sure he is at [facility]?

## 2022-05-18 DIAGNOSIS — C760 Malignant neoplasm of head, face and neck: Secondary | ICD-10-CM | POA: Diagnosis not present

## 2022-05-18 DIAGNOSIS — E440 Moderate protein-calorie malnutrition: Secondary | ICD-10-CM | POA: Diagnosis not present

## 2022-05-18 DIAGNOSIS — Z79899 Other long term (current) drug therapy: Secondary | ICD-10-CM | POA: Diagnosis not present

## 2022-05-18 DIAGNOSIS — I427 Cardiomyopathy due to drug and external agent: Secondary | ICD-10-CM | POA: Diagnosis not present

## 2022-05-18 DIAGNOSIS — T451X5A Adverse effect of antineoplastic and immunosuppressive drugs, initial encounter: Secondary | ICD-10-CM | POA: Diagnosis not present

## 2022-05-18 DIAGNOSIS — R531 Weakness: Secondary | ICD-10-CM | POA: Diagnosis not present

## 2022-05-18 DIAGNOSIS — Z981 Arthrodesis status: Secondary | ICD-10-CM | POA: Diagnosis not present

## 2022-05-18 DIAGNOSIS — Z923 Personal history of irradiation: Secondary | ICD-10-CM | POA: Diagnosis not present

## 2022-05-18 DIAGNOSIS — C3412 Malignant neoplasm of upper lobe, left bronchus or lung: Secondary | ICD-10-CM | POA: Diagnosis not present

## 2022-05-18 DIAGNOSIS — S2242XA Multiple fractures of ribs, left side, initial encounter for closed fracture: Secondary | ICD-10-CM | POA: Diagnosis not present

## 2022-05-18 DIAGNOSIS — C7951 Secondary malignant neoplasm of bone: Secondary | ICD-10-CM | POA: Diagnosis not present

## 2022-05-18 DIAGNOSIS — M79601 Pain in right arm: Secondary | ICD-10-CM | POA: Diagnosis not present

## 2022-05-18 DIAGNOSIS — C782 Secondary malignant neoplasm of pleura: Secondary | ICD-10-CM | POA: Diagnosis not present

## 2022-05-18 DIAGNOSIS — I119 Hypertensive heart disease without heart failure: Secondary | ICD-10-CM | POA: Diagnosis not present

## 2022-05-18 DIAGNOSIS — I6501 Occlusion and stenosis of right vertebral artery: Secondary | ICD-10-CM | POA: Diagnosis not present

## 2022-05-18 DIAGNOSIS — M5412 Radiculopathy, cervical region: Secondary | ICD-10-CM | POA: Diagnosis not present

## 2022-05-18 DIAGNOSIS — R29898 Other symptoms and signs involving the musculoskeletal system: Secondary | ICD-10-CM | POA: Diagnosis not present

## 2022-05-18 DIAGNOSIS — I5022 Chronic systolic (congestive) heart failure: Secondary | ICD-10-CM | POA: Diagnosis not present

## 2022-05-18 DIAGNOSIS — X58XXXA Exposure to other specified factors, initial encounter: Secondary | ICD-10-CM | POA: Diagnosis not present

## 2022-05-18 DIAGNOSIS — D6859 Other primary thrombophilia: Secondary | ICD-10-CM | POA: Diagnosis not present

## 2022-05-18 DIAGNOSIS — M4802 Spinal stenosis, cervical region: Secondary | ICD-10-CM | POA: Diagnosis not present

## 2022-05-18 DIAGNOSIS — Z515 Encounter for palliative care: Secondary | ICD-10-CM | POA: Diagnosis not present

## 2022-05-18 DIAGNOSIS — M5489 Other dorsalgia: Secondary | ICD-10-CM | POA: Diagnosis not present

## 2022-05-18 DIAGNOSIS — E222 Syndrome of inappropriate secretion of antidiuretic hormone: Secondary | ICD-10-CM | POA: Diagnosis not present

## 2022-05-18 DIAGNOSIS — J45909 Unspecified asthma, uncomplicated: Secondary | ICD-10-CM | POA: Diagnosis not present

## 2022-05-18 DIAGNOSIS — C3492 Malignant neoplasm of unspecified part of left bronchus or lung: Secondary | ICD-10-CM | POA: Diagnosis not present

## 2022-05-18 DIAGNOSIS — Z681 Body mass index (BMI) 19 or less, adult: Secondary | ICD-10-CM | POA: Diagnosis not present

## 2022-05-18 DIAGNOSIS — Z87891 Personal history of nicotine dependence: Secondary | ICD-10-CM | POA: Diagnosis not present

## 2022-05-18 DIAGNOSIS — Z01818 Encounter for other preprocedural examination: Secondary | ICD-10-CM | POA: Diagnosis not present

## 2022-05-18 DIAGNOSIS — X58XXXD Exposure to other specified factors, subsequent encounter: Secondary | ICD-10-CM | POA: Diagnosis not present

## 2022-05-18 DIAGNOSIS — S143XXD Injury of brachial plexus, subsequent encounter: Secondary | ICD-10-CM | POA: Diagnosis not present

## 2022-05-19 ENCOUNTER — Other Ambulatory Visit: Payer: Self-pay | Admitting: Oncology

## 2022-05-25 ENCOUNTER — Inpatient Hospital Stay: Payer: 59 | Admitting: Hospice and Palliative Medicine

## 2022-05-25 ENCOUNTER — Inpatient Hospital Stay: Payer: 59 | Admitting: Oncology

## 2022-05-27 ENCOUNTER — Inpatient Hospital Stay (HOSPITAL_BASED_OUTPATIENT_CLINIC_OR_DEPARTMENT_OTHER): Payer: 59 | Admitting: Hospice and Palliative Medicine

## 2022-05-27 ENCOUNTER — Inpatient Hospital Stay: Payer: 59 | Attending: Oncology | Admitting: Oncology

## 2022-05-27 ENCOUNTER — Ambulatory Visit: Payer: 59 | Admitting: Oncology

## 2022-05-27 ENCOUNTER — Encounter: Payer: Self-pay | Admitting: Oncology

## 2022-05-27 ENCOUNTER — Inpatient Hospital Stay: Payer: 59

## 2022-05-27 VITALS — BP 91/71 | HR 112 | Temp 96.6°F | Resp 17 | Wt 123.0 lb

## 2022-05-27 DIAGNOSIS — C770 Secondary and unspecified malignant neoplasm of lymph nodes of head, face and neck: Secondary | ICD-10-CM | POA: Diagnosis not present

## 2022-05-27 DIAGNOSIS — E871 Hypo-osmolality and hyponatremia: Secondary | ICD-10-CM | POA: Diagnosis not present

## 2022-05-27 DIAGNOSIS — Z87891 Personal history of nicotine dependence: Secondary | ICD-10-CM | POA: Diagnosis not present

## 2022-05-27 DIAGNOSIS — R221 Localized swelling, mass and lump, neck: Secondary | ICD-10-CM | POA: Insufficient documentation

## 2022-05-27 DIAGNOSIS — Z7952 Long term (current) use of systemic steroids: Secondary | ICD-10-CM | POA: Insufficient documentation

## 2022-05-27 DIAGNOSIS — Z5189 Encounter for other specified aftercare: Secondary | ICD-10-CM | POA: Diagnosis not present

## 2022-05-27 DIAGNOSIS — Z515 Encounter for palliative care: Secondary | ICD-10-CM | POA: Insufficient documentation

## 2022-05-27 DIAGNOSIS — Z7189 Other specified counseling: Secondary | ICD-10-CM

## 2022-05-27 DIAGNOSIS — J45909 Unspecified asthma, uncomplicated: Secondary | ICD-10-CM | POA: Insufficient documentation

## 2022-05-27 DIAGNOSIS — M542 Cervicalgia: Secondary | ICD-10-CM | POA: Insufficient documentation

## 2022-05-27 DIAGNOSIS — Z923 Personal history of irradiation: Secondary | ICD-10-CM | POA: Insufficient documentation

## 2022-05-27 DIAGNOSIS — C7951 Secondary malignant neoplasm of bone: Secondary | ICD-10-CM | POA: Insufficient documentation

## 2022-05-27 DIAGNOSIS — Z79899 Other long term (current) drug therapy: Secondary | ICD-10-CM | POA: Insufficient documentation

## 2022-05-27 DIAGNOSIS — Z5111 Encounter for antineoplastic chemotherapy: Secondary | ICD-10-CM | POA: Diagnosis not present

## 2022-05-27 DIAGNOSIS — C3412 Malignant neoplasm of upper lobe, left bronchus or lung: Secondary | ICD-10-CM | POA: Diagnosis not present

## 2022-05-27 MED ORDER — FENTANYL 25 MCG/HR TD PT72: 1.0000 | MEDICATED_PATCH | TRANSDERMAL | 0 refills | Status: DC

## 2022-05-27 NOTE — Progress Notes (Signed)
[place] at [facility] [phone] [phone]   Name: Kyle Guerrero Date: 05/27/2022 MRN: [id number]  DOB: Nov 19, 1971  Patient Care Team: Pcp, No as PCP - General Kate Sable, MD as PCP - Cardiology (Cardiology) Telford Nab, RN as Registered Nurse Sindy Guadeloupe, MD as Consulting Physician (Hematology and Oncology) Sindy Guadeloupe, MD as Consulting Physician (Hematology and Oncology)    REASON FOR CONSULTATION: Kyle Guerrero is a 51 y.o. male with multiple medical problems including stage IV non-small cell lung cancer with pleural involvement and a recurrent large cervical neck mass.  Patient is status post concurrent chemoradiation and underwent surgical resection of his spinal mass.  Unfortunately, he has had residual upper extremity weakness.  He has had ongoing pain.  Palliative care was consulted to help address goals and manage ongoing symptoms.  SOCIAL HISTORY:     reports that he quit smoking about 2 years ago. His smoking use included cigarettes. He smoked an average of 2 packs per day. He has never used smokeless tobacco. He reports that he does not currently use alcohol. He reports current drug use. Drug: Marijuana.  Patient is unmarried.  He lives at home alone.  He does have a significant other/girlfriend who is involved and sees him daily.  His parents are deceased.  He has a sister from whom he is reportedly estranged.  Patient previously worked in a [employer].  ADVANCE DIRECTIVES:  Does not have  CODE STATUS: DNR/DNI (DNR form signed on 05/27/2022)  PAST MEDICAL HISTORY: Past Medical History:  Diagnosis Date   Asthma    Lung cancer ([place])     PAST SURGICAL HISTORY:  Past Surgical History:  Procedure Laterality Date   CYST EXCISION     IR IMAGING GUIDED PORT INSERTION  12/05/2019   VIDEO BRONCHOSCOPY WITH ENDOBRONCHIAL ULTRASOUND Left 11/22/2019   Procedure: VIDEO  BRONCHOSCOPY WITH ENDOBRONCHIAL ULTRASOUND;  Surgeon: Tyler Pita, MD;  Location: [facility] ORS;  Service: Thoracic;  Laterality: Left;    HEMATOLOGY/ONCOLOGY HISTORY:  Oncology History  Malignant neoplasm of upper lobe of left lung ([place])  11/29/2019 Initial Diagnosis   Malignant neoplasm of upper lobe of left lung ([place])    11/29/2019 Cancer Staging   Staging form: Lung, AJCC 8th Edition - Clinical stage from 11/29/2019: Stage IVA (cT2, cN2, cM1a) - Signed by Sindy Guadeloupe, MD on 11/29/2019    12/03/2019 - 01/16/2020 Chemotherapy   The patient had dexamethasone (DECADRON) 4 MG tablet, 8 mg, Oral, Daily, 1 of 1 cycle, Start date: 11/29/2019, End date: 02/04/2020 palonosetron (ALOXI) injection 0.25 mg, 0.25 mg, Intravenous,  Once, 7 of 7 cycles Administration: 0.25 mg (12/03/2019), 0.25 mg (12/12/2019), 0.25 mg (12/19/2019), 0.25 mg (12/26/2019), 0.25 mg (01/02/2020), 0.25 mg (01/09/2020), 0.25 mg (01/16/2020) CARBOplatin (PARAPLATIN) 260 mg in sodium chloride 0.9 % 250 mL chemo infusion, 260 mg (100 % of original dose 262 mg), Intravenous,  Once, 7 of 7 cycles Dose modification:   (original dose 262 mg, Cycle 1) Administration: 260 mg (12/03/2019), 260 mg (12/12/2019), 290 mg (12/19/2019), 290 mg (12/26/2019), 290 mg (01/02/2020), 290 mg (01/09/2020), 290 mg (01/16/2020) PACLitaxel (TAXOL) 84 mg in sodium chloride 0.9 % 250 mL chemo infusion (</= 23m/m2), 45 mg/m2 = 84 mg, Intravenous,  Once, 7 of 7 cycles Administration: 84 mg (12/03/2019), 84 mg (12/12/2019), 84 mg (12/19/2019), 84 mg (12/26/2019), 84 mg (01/02/2020), 84 mg (01/09/2020), 84 mg (01/16/2020)   for chemotherapy treatment.  02/21/2020 - 07/24/2020 Chemotherapy   The patient had palonosetron (ALOXI) injection 0.25 mg, 0.25 mg, Intravenous,  Once, 2 of 2 cycles Administration: 0.25 mg (02/21/2020), 0.25 mg (03/13/2020) pegfilgrastim (NEULASTA ONPRO KIT) injection 6 mg, 6 mg, Subcutaneous, Once, 2 of 2 cycles Administration: 6 mg (02/21/2020), 6  mg (03/13/2020) CARBOplatin (PARAPLATIN) 750 mg in sodium chloride 0.9 % 250 mL chemo infusion, 750 mg (114 % of original dose 658 mg), Intravenous,  Once, 2 of 2 cycles Dose modification:   (original dose 658 mg, Cycle 1) Administration: 750 mg (02/21/2020), 670 mg (03/13/2020) fosaprepitant (EMEND) 150 mg in sodium chloride 0.9 % 145 mL IVPB, 150 mg, Intravenous,  Once, 2 of 2 cycles Administration: 150 mg (02/21/2020), 150 mg (03/13/2020) PACLitaxel (TAXOL) 342 mg in sodium chloride 0.9 % 500 mL chemo infusion (> 11m/m2), 175 mg/m2 = 342 mg (100 % of original dose 175 mg/m2), Intravenous,  Once, 2 of 2 cycles Dose modification: 175 mg/m2 (original dose 175 mg/m2, Cycle 1, Reason: Other (see comments), Comment: cytopenia) Administration: 342 mg (02/21/2020), 342 mg (03/13/2020) pembrolizumab (KEYTRUDA) 200 mg in sodium chloride 0.9 % 50 mL chemo infusion, 200 mg, Intravenous, Once, 8 of 9 cycles Administration: 200 mg (02/21/2020), 200 mg (04/10/2020), 200 mg (05/01/2020), 200 mg (03/13/2020), 200 mg (05/22/2020), 200 mg (06/12/2020), 200 mg (07/03/2020), 200 mg (07/24/2020)   for chemotherapy treatment.     08/21/2020 - 06/25/2021 Chemotherapy   Patient is on Treatment Plan : LUNG NSCLC Pemetrexed ([name]) / Carboplatin q21d x 4 cycles      10/25/2021 - 01/05/2022 Chemotherapy   Patient is on Treatment Plan : Lung Fam-Trastuzumab Deruxtecan-nxki (Enhertu) (6.4) q21d      04/15/2022 -  Chemotherapy   Patient is on Treatment Plan : lung Docetaxel q21d        ALLERGIES:  has No Known Allergies.  MEDICATIONS:  Current Outpatient Medications  Medication Sig Dispense Refill   DULoxetine (CYMBALTA) 30 MG capsule Take by mouth.     baclofen (LIORESAL) 10 MG tablet TAKE 1 TABLET BY MOUTH THREE TIMES A DAY 270 tablet 1   dexamethasone (DECADRON) 4 MG tablet Take 1 tablet (4 mg total) by mouth 2 (two) times daily with a meal. 20 tablet 0   docusate sodium (COLACE) 100 MG capsule Take 100 mg by mouth daily.      DULoxetine (CYMBALTA) 30 MG capsule TAKE 1 CAPSULE BY MOUTH DAILY FOR 7 DAYS, THEN 2 CAPSULES DAILY FOR 23 DAYS. 159 capsule 1   fentaNYL (DURAGESIC) 12 MCG/HR      lidocaine-prilocaine (EMLA) cream Apply to affected area once 30 g 3   LORazepam (ATIVAN) 0.5 MG tablet Take 1 tablet (0.5 mg total) by mouth every 6 (six) hours as needed (Nausea or vomiting). (Patient not taking: Reported on 04/15/2022) 30 tablet 0   losartan (COZAAR) 25 MG tablet Take 1 tablet (25 mg total) by mouth daily. 30 tablet 3   metoprolol succinate (TOPROL XL) 25 MG 24 hr tablet Take 1 tablet (25 mg total) by mouth daily. 30 tablet 5   OLANZapine (ZYPREXA) 10 MG tablet TAKE 1 TABLET BY MOUTH EVERYDAY AT BEDTIME 90 tablet 1   ondansetron (ZOFRAN) 8 MG tablet Take 1 tablet (8 mg total) by mouth 2 (two) times daily as needed for refractory nausea / vomiting. (Patient not taking: Reported on 04/15/2022) 30 tablet 1   oxyCODONE (OXY IR/ROXICODONE) 5 MG immediate release tablet Take 1 tablet (5 mg total) by mouth every 4 (four) hours as  needed for moderate pain or severe pain. 60 tablet 0   prochlorperazine (COMPAZINE) 10 MG tablet Take 1 tablet (10 mg total) by mouth every 6 (six) hours as needed (Nausea or vomiting). (Patient not taking: Reported on 04/15/2022) 30 tablet 1   No current facility-administered medications for this visit.   Facility-Administered Medications Ordered in Other Visits  Medication Dose Route Frequency Provider Last Rate Last Admin   heparin lock flush 100 unit/mL  500 Units Intravenous Once Sindy Guadeloupe, MD       heparin lock flush 100 unit/mL  500 Units Intravenous Once Sindy Guadeloupe, MD       sodium chloride flush (NS) 0.9 % injection 10 mL  10 mL Intravenous PRN Sindy Guadeloupe, MD   10 mL at 05/01/20 0900   sodium chloride flush (NS) 0.9 % injection 10 mL  10 mL Intravenous PRN Sindy Guadeloupe, MD   10 mL at 01/29/21 0835    VITAL SIGNS: BP 91/71   Pulse (!) 112   Temp (!) 96.6 F (35.9 C)  (Tympanic)   Resp 17   Wt 123 lb (55.8 kg)   SpO2 100%   BMI 17.16 kg/m  Filed Weights   05/27/22 1306  Weight: 123 lb (55.8 kg)    Estimated body mass index is 17.16 kg/m as calculated from the following:   Height as of 05/13/22: 5' 11" (1.803 m).   Weight as of this encounter: 123 lb (55.8 kg).  LABS: CBC:    Component Value Date/Time   WBC 7.7 05/13/2022 0946   HGB 10.6 (L) 05/13/2022 0946   HCT 32.4 (L) 05/13/2022 0946   PLT 654 (H) 05/13/2022 0946   MCV 84.6 05/13/2022 0946   NEUTROABS 5.3 05/13/2022 0946   LYMPHSABS 1.2 05/13/2022 0946   MONOABS 1.0 05/13/2022 0946   EOSABS 0.1 05/13/2022 0946   BASOSABS 0.0 05/13/2022 0946   Comprehensive Metabolic Panel:    Component Value Date/Time   NA 129 (L) 05/13/2022 0946   K 3.4 (L) 05/13/2022 0946   CL 92 (L) 05/13/2022 0946   CO2 27 05/13/2022 0946   BUN 11 05/13/2022 0946   CREATININE 0.86 05/13/2022 0946   GLUCOSE 145 (H) 05/13/2022 0946   CALCIUM 9.0 05/13/2022 0946   AST 24 05/13/2022 0946   ALT 23 05/13/2022 0946   ALKPHOS 99 05/13/2022 0946   BILITOT 0.6 05/13/2022 0946   PROT 8.1 05/13/2022 0946   ALBUMIN 3.4 (L) 05/13/2022 0946    RADIOGRAPHIC STUDIES: DG SWALLOW FUNC SPEECH PATH  Result Date: 05/04/2022 Table formatting from the original result was not included. Images from the original result were not included. Objective Swallowing Evaluation: Type of Study: MBS-Modified Barium Swallow Study  Patient Details Name: Kyle Guerrero MRN: [id number] Date of Birth: 12/31/1970 Today's Date: 05/04/2022 Time: SLP Start Time (ACUTE ONLY): 1510 -SLP Stop Time (ACUTE ONLY): 1545 SLP Time Calculation (min) (ACUTE ONLY): 35 min Past Medical History: Past Medical History: Diagnosis Date  Asthma   Lung cancer ([place])  Past Surgical History: Past Surgical History: Procedure Laterality Date  CYST EXCISION    IR IMAGING GUIDED PORT INSERTION  12/05/2019  VIDEO BRONCHOSCOPY WITH ENDOBRONCHIAL ULTRASOUND Left 11/22/2019   Procedure: VIDEO BRONCHOSCOPY WITH ENDOBRONCHIAL ULTRASOUND;  Surgeon: Tyler Pita, MD;  Location: [facility] ORS;  Service: Thoracic;  Laterality: Left; HPI: Patient is a 51 y.o. male with metastatic adenocarcinoma of the lung with disease progression around cervical spine. Specifically, MRI of the cervical spine  which showed a 4.6 x 4.8 x 8.2 cm mass in the left prevertebral region from C2-C7 levels.  The mass partially encases the left vertebral artery and abuts the preforaminal segment.  Invades the vertebral bodies and edema of the left C5 articular pillar.  There is slight extension into the left C4-C5 thecal sac without central thecal sac compression. Pt to receive palliative docetaxel treatments. Pt's appettiate remains poor and is currently taking 42m olanzapine for appetite stimulation. Chart reveals that pt received 30 Gy to his left cervical spine with last treatment on 10/11/2021.  Subjective: pt pleasant, conversant, known to this writer  Recommendations for follow up therapy are one component of a multi-disciplinary discharge planning process, led by the attending physician.  Recommendations may be updated based on patient status, additional functional criteria and insurance authorization. Assessment / Plan / Recommendation   05/04/2022   2:00 PM Clinical Impressions Clinical Impression Pt presents with moderate pharyngoesophageal dysphagia.  Cervical spine fusion hardware is noted. As such, penetration and aspiration is confirmed with pt in oblique position. Pt's esophagus is grossly patent. This prevents him from generating sufficient pressure required to propell boluses thru the pharynx. As a result, pt produces multiple swallows per bolus with moderate pharyngeal residue remaining after each swallow. While pt does intermittently aspirate before the swallowing, he consistently penetrates and aspiration pharyngeal residue post swallow. All aspiration is silent. However, when cued to cough, pt  produces a strong cough that expells ALL aspirates. While pt is at a high risk of aspirating, he was able to return demonstrate sip, swallow, cough over 10 trials after the study. Pt also demontrated good comprehension of aspiration and the need to utilize a cough to reduce risk of aspiration. At this time, recommend pt continue a regular diet with thin liquids via cup using strategy SIP, SWALLOW, COUGH. Should pt experience any respiratory decline, recommend re-evaluation to assess for potential diet modification at that time. SLP Visit Diagnosis Dysphagia, pharyngoesophageal phase (R13.14) Impact on safety and function Moderate aspiration risk     05/04/2022   2:00 PM Treatment Recommendations Treatment Recommendations No treatment recommended at this time      View : No data to display.      05/04/2022   2:00 PM Diet Recommendations SLP Diet Recommendations Regular solids;Thin liquid Liquid Administration via Cup Medication Administration Whole meds with liquid Compensations Minimize environmental distractions;Slow rate;Small sips/bites Postural Changes Seated upright at 90 degrees     05/04/2022   2:00 PM Other Recommendations Oral Care Recommendations Oral care BID Follow Up Recommendations No SLP follow up    View : No data to display.        05/04/2022   2:00 PM [facility]   05/04/2022   2:00 PM Pharyngeal Phase Pharyngeal Phase Impaired Pharyngeal- Nectar Teaspoon Reduced pharyngeal peristalsis;Reduced epiglottic inversion;Reduced anterior laryngeal mobility;Reduced laryngeal elevation;Reduced airway/laryngeal closure;Penetration/Aspiration before swallow;Penetration/Apiration after swallow;Pharyngeal residue - valleculae;Pharyngeal residue - pyriform;Pharyngeal residue - posterior pharnyx Pharyngeal Material enters airway, remains ABOVE vocal cords and not ejected out Pharyngeal- Nectar Cup Reduced pharyngeal peristalsis;Reduced epiglottic inversion;Reduced anterior laryngeal mobility;Reduced  laryngeal elevation;Reduced airway/laryngeal closure;Penetration/Aspiration before swallow;Penetration/Apiration after swallow;Pharyngeal residue - valleculae;Pharyngeal residue - pyriform;Pharyngeal residue - posterior pharnyx Pharyngeal Material enters airway, remains ABOVE vocal cords and not ejected out;Material enters airway, passes BELOW cords without attempt by patient to eject out (silent aspiration) Pharyngeal- Thin Teaspoon Reduced pharyngeal peristalsis;Reduced epiglottic inversion;Reduced anterior laryngeal mobility;Reduced laryngeal elevation;Reduced airway/laryngeal closure;Penetration/Aspiration before swallow;Penetration/Apiration after swallow;Pharyngeal residue -  valleculae;Pharyngeal residue - pyriform;Pharyngeal residue - posterior pharnyx Pharyngeal Material enters airway, passes BELOW cords without attempt by patient to eject out (silent aspiration) Pharyngeal- Thin Cup Reduced pharyngeal peristalsis;Reduced epiglottic inversion;Reduced anterior laryngeal mobility;Reduced laryngeal elevation;Reduced airway/laryngeal closure;Penetration/Aspiration before swallow;Penetration/Apiration after swallow;Pharyngeal residue - valleculae;Pharyngeal residue - pyriform;Pharyngeal residue - posterior pharnyx Pharyngeal Material enters airway, remains ABOVE vocal cords and not ejected out;Material enters airway, passes BELOW cords without attempt by patient to eject out (silent aspiration) Pharyngeal- Puree Reduced pharyngeal peristalsis;Reduced epiglottic inversion;Reduced anterior laryngeal mobility;Reduced laryngeal elevation;Reduced airway/laryngeal closure;Penetration/Aspiration before swallow;Penetration/Apiration after swallow;Pharyngeal residue - valleculae;Pharyngeal residue - pyriform;Pharyngeal residue - posterior pharnyx Pharyngeal Material enters airway, remains ABOVE vocal cords and not ejected out;Material enters airway, passes BELOW cords without attempt by patient to eject out (silent  aspiration) Pharyngeal- Pill [facility]    05/04/2022   2:00 PM Cervical Esophageal Phase  Cervical Esophageal Phase Impaired Nectar Teaspoon Esophageal backflow into the pharynx Nectar Cup Esophageal backflow into cervical esophagus;Esophageal backflow into the pharynx Thin Teaspoon Esophageal backflow into cervical esophagus;Esophageal backflow into the pharynx Thin Cup Esophageal backflow into cervical esophagus;Esophageal backflow into the pharynx Puree Esophageal backflow into cervical esophagus;Esophageal backflow into the pharynx Pill -- Happi B. Rutherford Nail M.S., CCC-SLP, [place] Office [phone] Stormy Fabian 05/04/2022, 2:37 PM                      PERFORMANCE STATUS (ECOG) : 2 - Symptomatic, <50% confined to bed  Review of Systems Unless otherwise noted, a complete review of systems is negative.  Physical Exam General: NAD Cardiovascular: regular rate and rhythm Pulmonary: clear ant fields Abdomen: soft, nontender, + bowel sounds GU: no suprapubic tenderness Extremities: no edema, no joint deformities Skin: no rashes Neurological: Left-sided weakness  IMPRESSION: I met with patient following his visit with Dr. Janese Banks.  Unfortunately, patient has had rapidly expanding neck/cervical mass now with some right upper extremity weakness.  He does have underlying left upper extremity weakness from previous mass, which underwent resection at [facility].  Patient has also had XRT to the previous mass.  Unclear if he would be a candidate for repeat XRT but will reach out to radiation oncology.  Dr. Janese Banks plans to start patient on chemotherapy next week.  Hospice was also discussed as an option but patient would like to proceed with treatment.  Patient verbalized understanding to me that overall prognosis is poor.  He is interested in proceeding with treatment but recognizes that there are likely limited future options.  We did discuss future hospice  involvement.  Patient lives at home alone and has limited social support.  We will consult community palliative care.  And social work  He does have a girlfriend who is involved and sees him daily and helps with his care.  Patient says that his closest living relative is his sister but that they are estranged.  They reportedly had a falling out after the passing of his mother.  I encourage patient to complete healthcare power of attorney and living will documents.  I sent him home with ACP documents today.  He says that he thinks he has a cousin that would agree to be his HCPOA or his girlfriend.  We discussed CODE STATUS.  Patient states that he saw his mother and grandmother on a ventilator and that he would not want that for himself.  He says that he would just want to be left alone and have a natural passing when it is his  time.  He was in agreement with DNR/DNI.  I sent him home with a signed DNR order today.  Symptomatically, he has had persistent and severe neck pain stemming from his mass.  However, he says that he is infrequently taking oxycodone and was unable to offer nasal nation for this.  He also says that he has not used the fentanyl patches in over a week but again does not offer much of an explanation.  We did discuss maximizing utilization of his oxycodone.  He is in agreement with restarting the transdermal fentanyl and I will increase the dose to 25 mcg every 72 hours.  Continue daily bowel regimen.  PLAN: -Continue current scope of treatment -Referrals to community palliative care and social work -Patient would likely benefit from ACP clinic -ACP documents sent home with him today -DNR/DNI -Continue oxycodone -Increase fentanyl 25 mcg every 72 hours.  Refill sent to pharmacy. -Continue daily bowel regimen -RTC 1 week  Case and plan discussed with Dr. Janese Banks   Patient expressed understanding and was in agreement with this plan. He also understands that He can call the  clinic at any time with any questions, concerns, or complaints.     Time Total: 30 minutes  Visit consisted of counseling and education dealing with the complex and emotionally intense issues of symptom management and palliative care in the setting of serious and potentially life-threatening illness.Greater than 50%  of this time was spent counseling and coordinating care related to the above assessment and plan.  Signed by: Altha Harm, PhD, NP-C

## 2022-05-27 NOTE — Progress Notes (Signed)
Returns for f/u. Recently discharged from [facility]. Left arm in sling. Rates pain 8/10 to neck and back. States that pain meds do not help much.

## 2022-05-31 ENCOUNTER — Encounter: Payer: Self-pay | Admitting: Licensed Clinical Social Worker

## 2022-05-31 MED FILL — Dexamethasone Sodium Phosphate Inj 100 MG/10ML: INTRAMUSCULAR | Qty: 1 | Status: AC

## 2022-05-31 NOTE — Progress Notes (Signed)
[place] Work  Clinical Social Work was referred by Art therapist for assessment of psychosocial needs.  Clinical Social Worker contacted patient by phone  to offer support and assess for needs.  CSW spoke with patient who was not interested in speaking with CSW and stated, "I don't have any needs and there is nothing you can help me with." CSW encouraged patient to contact CSW if he has any new concerns or needs arise.  Patient verbalized understanding.   Adelene Amas, [place] Worker [facility]

## 2022-06-01 ENCOUNTER — Inpatient Hospital Stay: Payer: 59

## 2022-06-01 ENCOUNTER — Inpatient Hospital Stay (HOSPITAL_BASED_OUTPATIENT_CLINIC_OR_DEPARTMENT_OTHER): Payer: 59 | Admitting: Hospice and Palliative Medicine

## 2022-06-01 ENCOUNTER — Other Ambulatory Visit: Payer: Self-pay

## 2022-06-01 VITALS — BP 118/88 | HR 103 | Temp 96.1°F | Resp 16 | Wt 124.0 lb

## 2022-06-01 DIAGNOSIS — G893 Neoplasm related pain (acute) (chronic): Secondary | ICD-10-CM | POA: Diagnosis not present

## 2022-06-01 DIAGNOSIS — C3412 Malignant neoplasm of upper lobe, left bronchus or lung: Secondary | ICD-10-CM

## 2022-06-01 DIAGNOSIS — M542 Cervicalgia: Secondary | ICD-10-CM | POA: Diagnosis not present

## 2022-06-01 DIAGNOSIS — E871 Hypo-osmolality and hyponatremia: Secondary | ICD-10-CM | POA: Diagnosis not present

## 2022-06-01 DIAGNOSIS — Z79899 Other long term (current) drug therapy: Secondary | ICD-10-CM | POA: Diagnosis not present

## 2022-06-01 DIAGNOSIS — J45909 Unspecified asthma, uncomplicated: Secondary | ICD-10-CM | POA: Diagnosis not present

## 2022-06-01 DIAGNOSIS — E876 Hypokalemia: Secondary | ICD-10-CM

## 2022-06-01 DIAGNOSIS — C770 Secondary and unspecified malignant neoplasm of lymph nodes of head, face and neck: Secondary | ICD-10-CM | POA: Diagnosis not present

## 2022-06-01 DIAGNOSIS — Z515 Encounter for palliative care: Secondary | ICD-10-CM

## 2022-06-01 DIAGNOSIS — Z87891 Personal history of nicotine dependence: Secondary | ICD-10-CM | POA: Diagnosis not present

## 2022-06-01 DIAGNOSIS — Z923 Personal history of irradiation: Secondary | ICD-10-CM | POA: Diagnosis not present

## 2022-06-01 DIAGNOSIS — Z5111 Encounter for antineoplastic chemotherapy: Secondary | ICD-10-CM | POA: Diagnosis not present

## 2022-06-01 DIAGNOSIS — Z5189 Encounter for other specified aftercare: Secondary | ICD-10-CM | POA: Diagnosis not present

## 2022-06-01 DIAGNOSIS — C7951 Secondary malignant neoplasm of bone: Secondary | ICD-10-CM | POA: Diagnosis not present

## 2022-06-01 DIAGNOSIS — Z7952 Long term (current) use of systemic steroids: Secondary | ICD-10-CM | POA: Diagnosis not present

## 2022-06-01 DIAGNOSIS — R221 Localized swelling, mass and lump, neck: Secondary | ICD-10-CM | POA: Diagnosis not present

## 2022-06-01 LAB — COMPREHENSIVE METABOLIC PANEL
ALT: 27 U/L (ref 0–44)
AST: 28 U/L (ref 15–41)
Albumin: 3.3 g/dL — ABNORMAL LOW (ref 3.5–5.0)
Alkaline Phosphatase: 99 U/L (ref 38–126)
Anion gap: 7 (ref 5–15)
BUN: 11 mg/dL (ref 6–20)
CO2: 30 mmol/L (ref 22–32)
Calcium: 8.9 mg/dL (ref 8.9–10.3)
Chloride: 91 mmol/L — ABNORMAL LOW (ref 98–111)
Creatinine, Ser: 0.8 mg/dL (ref 0.61–1.24)
GFR, Estimated: >60 mL/min (ref >60)
Glucose, Bld: 130 mg/dL — ABNORMAL HIGH (ref 70–99)
Potassium: 3.2 mmol/L — ABNORMAL LOW (ref 3.5–5.1)
Sodium: 128 mmol/L — ABNORMAL LOW (ref 135–145)
Total Bilirubin: 0.6 mg/dL (ref 0.3–1.2)
Total Protein: 7.8 g/dL (ref 6.5–8.1)

## 2022-06-01 LAB — CBC WITH DIFFERENTIAL/PLATELET
Abs Immature Granulocytes: 0.03 10*3/uL (ref 0.00–0.07)
Basophils Absolute: 0 10*3/uL (ref 0.0–0.1)
Basophils Relative: 0 %
Eosinophils Absolute: 0.2 10*3/uL (ref 0.0–0.5)
Eosinophils Relative: 2 %
HCT: 32.1 % — ABNORMAL LOW (ref 39.0–52.0)
Hemoglobin: 10.4 g/dL — ABNORMAL LOW (ref 13.0–17.0)
Immature Granulocytes: 0 %
Lymphocytes Relative: 21 %
Lymphs Abs: 1.6 10*3/uL (ref 0.7–4.0)
MCH: 27.1 pg (ref 26.0–34.0)
MCHC: 32.4 g/dL (ref 30.0–36.0)
MCV: 83.6 fL (ref 80.0–100.0)
Monocytes Absolute: 1 10*3/uL (ref 0.1–1.0)
Monocytes Relative: 14 %
Neutro Abs: 4.7 10*3/uL (ref 1.7–7.7)
Neutrophils Relative %: 63 %
Platelets: 605 10*3/uL — ABNORMAL HIGH (ref 150–400)
RBC: 3.84 MIL/uL — ABNORMAL LOW (ref 4.22–5.81)
RDW: 15.3 % (ref 11.5–15.5)
WBC: 7.6 10*3/uL (ref 4.0–10.5)
nRBC: 0 % (ref 0.0–0.2)

## 2022-06-01 LAB — MAGNESIUM: Magnesium: 1.7 mg/dL (ref 1.7–2.4)

## 2022-06-01 MED ORDER — HEPARIN SOD (PORK) LOCK FLUSH 100 UNIT/ML IV SOLN
500.0000 [IU] | Freq: Once | INTRAVENOUS | Status: AC | PRN
  Administered 2022-06-01: 500 [IU]
  Filled 2022-06-01: qty 5

## 2022-06-01 MED ORDER — POTASSIUM CHLORIDE IN NACL 20-0.9 MEQ/L-% IV SOLN
INTRAVENOUS | Status: AC
  Filled 2022-06-01: qty 1000

## 2022-06-01 MED ORDER — SODIUM CHLORIDE 0.9 % IV SOLN
75.0000 mg/m2 | Freq: Once | INTRAVENOUS | Status: AC
  Administered 2022-06-01: 130 mg via INTRAVENOUS
  Filled 2022-06-01: qty 13

## 2022-06-01 MED ORDER — POTASSIUM CHLORIDE 20 MEQ/100ML IV SOLN
20.0000 meq | Freq: Once | INTRAVENOUS | Status: AC
  Administered 2022-06-01: 20 meq via INTRAVENOUS

## 2022-06-01 MED ORDER — SODIUM CHLORIDE 0.9 % IV SOLN
10.0000 mg | Freq: Once | INTRAVENOUS | Status: AC
  Administered 2022-06-01: 10 mg via INTRAVENOUS
  Filled 2022-06-01: qty 10
  Filled 2022-06-01 (×2): qty 1

## 2022-06-01 MED ORDER — SODIUM CHLORIDE 0.9 % IV SOLN: 75.0000 mg/m2 | Freq: Once | INTRAVENOUS | Status: DC

## 2022-06-01 MED ORDER — SODIUM CHLORIDE 0.9 % IV SOLN
Freq: Once | INTRAVENOUS | Status: AC
  Filled 2022-06-01: qty 250

## 2022-06-01 NOTE — Patient Instructions (Signed)
[facility] CANCER CTR AT [place]  Discharge Instructions: Thank you for choosing [facility] to provide your oncology and hematology care.  If you have a lab appointment with the [facility], please go directly to the [facility] and check in at the registration area.  Wear comfortable clothing and clothing appropriate for easy access to any Portacath or PICC line.   We strive to give you quality time with your provider. You may need to reschedule your appointment if you arrive late (15 or more minutes).  Arriving late affects you and other patients whose appointments are after yours.  Also, if you miss three or more appointments without notifying the office, you may be dismissed from the clinic at the provider's discretion.      For prescription refill requests, have your pharmacy contact our office and allow 72 hours for refills to be completed.    Today you received the following chemotherapy and/or immunotherapy agents: Taxotere       To help prevent nausea and vomiting after your treatment, we encourage you to take your nausea medication as directed.  BELOW ARE SYMPTOMS THAT SHOULD BE REPORTED IMMEDIATELY: *FEVER GREATER THAN 100.4 F (38 C) OR HIGHER *CHILLS OR SWEATING *NAUSEA AND VOMITING THAT IS NOT CONTROLLED WITH YOUR NAUSEA MEDICATION *UNUSUAL SHORTNESS OF BREATH *UNUSUAL BRUISING OR BLEEDING *URINARY PROBLEMS (pain or burning when urinating, or frequent urination) *BOWEL PROBLEMS (unusual diarrhea, constipation, pain near the anus) TENDERNESS IN MOUTH AND THROAT WITH OR WITHOUT PRESENCE OF ULCERS (sore throat, sores in mouth, or a toothache) UNUSUAL RASH, SWELLING OR PAIN  UNUSUAL VAGINAL DISCHARGE OR ITCHING   Items with * indicate a potential emergency and should be followed up as soon as possible or go to the Emergency Department if any problems should occur.  Please show the CHEMOTHERAPY ALERT CARD or IMMUNOTHERAPY ALERT CARD at check-in to  the Emergency Department and triage nurse.  Should you have questions after your visit or need to cancel or reschedule your appointment, please contact [facility] CANCER [place] AT [place]  [phone] and follow the prompts.  Office hours are 8:00 a.m. to 4:30 p.m. Monday - Friday. Please note that voicemails left after 4:00 p.m. may not be returned until the following business day.  We are closed weekends and major holidays. You have access to a nurse at all times for urgent questions. Please call the main number to the clinic [phone] and follow the prompts.  For any non-urgent questions, you may also contact your provider using MyChart. We now offer e-Visits for anyone 79 and older to request care online for non-urgent symptoms. For details visit [email or web address].   Also download the MyChart app! Go to the app store, search "MyChart", open the app, select [facility], and log in with your MyChart username and password.  Due to Covid, a mask is required upon entering the hospital/clinic. If you do not have a mask, one will be given to you upon arrival. For doctor visits, patients may have 1 support person aged 49 or older with them. For treatment visits, patients cannot have anyone with them due to current Covid guidelines and our immunocompromised population.

## 2022-06-01 NOTE — Progress Notes (Signed)
[place] at [facility] Telephone:([phone] Fax:([phone]) [phone]   Name: Kyle Guerrero Date: 06/01/2022 MRN: [id number]  DOB: 1971-09-16  Patient Care Team: Pcp, No as PCP - General Kate Sable, MD as PCP - Cardiology (Cardiology) Telford Nab, RN as Registered Nurse Sindy Guadeloupe, MD as Consulting Physician (Hematology and Oncology) Sindy Guadeloupe, MD as Consulting Physician (Hematology and Oncology)    REASON FOR CONSULTATION: Kyle Guerrero is a 50 y.o. male with multiple medical problems including stage IV non-small cell lung cancer with pleural involvement and a recurrent large cervical neck mass.  Patient is status post concurrent chemoradiation and underwent surgical resection of his spinal mass.  Unfortunately, he has had residual upper extremity weakness.  He has had ongoing pain.  Palliative care was consulted to help address goals and manage ongoing symptoms.  SOCIAL HISTORY:     reports that he quit smoking about 2 years ago. His smoking use included cigarettes. He smoked an average of 2 packs per day. He has never used smokeless tobacco. He reports that he does not currently use alcohol. He reports current drug use. Drug: Marijuana.  Patient is unmarried.  He lives at home alone.  He does have a significant other/girlfriend who is involved and sees him daily.  His parents are deceased.  He has a sister from whom he is reportedly estranged.  Patient previously worked in a [employer].  ADVANCE DIRECTIVES:  Does not have  CODE STATUS: DNR/DNI (DNR form signed on 05/27/2022)  PAST MEDICAL HISTORY: Past Medical History:  Diagnosis Date   Asthma    Lung cancer ([place])     PAST SURGICAL HISTORY:  Past Surgical History:  Procedure Laterality Date   CYST EXCISION     IR IMAGING GUIDED PORT INSERTION  12/05/2019   VIDEO BRONCHOSCOPY WITH ENDOBRONCHIAL ULTRASOUND Left 11/22/2019   Procedure: VIDEO  BRONCHOSCOPY WITH ENDOBRONCHIAL ULTRASOUND;  Surgeon: Kyle Pita, MD;  Location: [facility] ORS;  Service: Thoracic;  Laterality: Left;    HEMATOLOGY/ONCOLOGY HISTORY:  Oncology History  Malignant neoplasm of upper lobe of left lung ([place])  11/29/2019 Initial Diagnosis   Malignant neoplasm of upper lobe of left lung ([place])   11/29/2019 Cancer Staging   Staging form: Lung, AJCC 8th Edition - Clinical stage from 11/29/2019: Stage IVA (cT2, cN2, cM1a) - Signed by Sindy Guadeloupe, MD on 11/29/2019   12/03/2019 - 01/16/2020 Chemotherapy   The patient had dexamethasone (DECADRON) 4 MG tablet, 8 mg, Oral, Daily, 1 of 1 cycle, Start date: 11/29/2019, End date: 02/04/2020 palonosetron (ALOXI) injection 0.25 mg, 0.25 mg, Intravenous,  Once, 7 of 7 cycles Administration: 0.25 mg (12/03/2019), 0.25 mg (12/12/2019), 0.25 mg (12/19/2019), 0.25 mg (12/26/2019), 0.25 mg (01/02/2020), 0.25 mg (01/09/2020), 0.25 mg (01/16/2020) CARBOplatin (PARAPLATIN) 260 mg in sodium chloride 0.9 % 250 mL chemo infusion, 260 mg (100 % of original dose 262 mg), Intravenous,  Once, 7 of 7 cycles Dose modification:   (original dose 262 mg, Cycle 1) Administration: 260 mg (12/03/2019), 260 mg (12/12/2019), 290 mg (12/19/2019), 290 mg (12/26/2019), 290 mg (01/02/2020), 290 mg (01/09/2020), 290 mg (01/16/2020) PACLitaxel (TAXOL) 84 mg in sodium chloride 0.9 % 250 mL chemo infusion (</= 52m/m2), 45 mg/m2 = 84 mg, Intravenous,  Once, 7 of 7 cycles Administration: 84 mg (12/03/2019), 84 mg (12/12/2019), 84 mg (12/19/2019), 84 mg (12/26/2019), 84 mg (01/02/2020), 84 mg (01/09/2020), 84 mg (01/16/2020)  for chemotherapy treatment.    02/21/2020 -  07/24/2020 Chemotherapy   The patient had palonosetron (ALOXI) injection 0.25 mg, 0.25 mg, Intravenous,  Once, 2 of 2 cycles Administration: 0.25 mg (02/21/2020), 0.25 mg (03/13/2020) pegfilgrastim (NEULASTA ONPRO KIT) injection 6 mg, 6 mg, Subcutaneous, Once, 2 of 2 cycles Administration: 6 mg (02/21/2020), 6 mg  (03/13/2020) CARBOplatin (PARAPLATIN) 750 mg in sodium chloride 0.9 % 250 mL chemo infusion, 750 mg (114 % of original dose 658 mg), Intravenous,  Once, 2 of 2 cycles Dose modification:   (original dose 658 mg, Cycle 1) Administration: 750 mg (02/21/2020), 670 mg (03/13/2020) fosaprepitant (EMEND) 150 mg in sodium chloride 0.9 % 145 mL IVPB, 150 mg, Intravenous,  Once, 2 of 2 cycles Administration: 150 mg (02/21/2020), 150 mg (03/13/2020) PACLitaxel (TAXOL) 342 mg in sodium chloride 0.9 % 500 mL chemo infusion (> 57m/m2), 175 mg/m2 = 342 mg (100 % of original dose 175 mg/m2), Intravenous,  Once, 2 of 2 cycles Dose modification: 175 mg/m2 (original dose 175 mg/m2, Cycle 1, Reason: Other (see comments), Comment: cytopenia) Administration: 342 mg (02/21/2020), 342 mg (03/13/2020) pembrolizumab (KEYTRUDA) 200 mg in sodium chloride 0.9 % 50 mL chemo infusion, 200 mg, Intravenous, Once, 8 of 9 cycles Administration: 200 mg (02/21/2020), 200 mg (04/10/2020), 200 mg (05/01/2020), 200 mg (03/13/2020), 200 mg (05/22/2020), 200 mg (06/12/2020), 200 mg (07/03/2020), 200 mg (07/24/2020)  for chemotherapy treatment.    08/21/2020 - 06/25/2021 Chemotherapy   Patient is on Treatment Plan : LUNG NSCLC Pemetrexed ([name]) / Carboplatin q21d x 4 cycles     10/25/2021 - 01/05/2022 Chemotherapy   Patient is on Treatment Plan : Lung Fam-Trastuzumab Deruxtecan-nxki (Enhertu) (6.4) q21d     04/15/2022 -  Chemotherapy   Patient is on Treatment Plan : lung Docetaxel q21d       ALLERGIES:  has No Known Allergies.  MEDICATIONS:  Current Outpatient Medications  Medication Sig Dispense Refill   baclofen (LIORESAL) 10 MG tablet TAKE 1 TABLET BY MOUTH THREE TIMES A DAY 270 tablet 1   dexamethasone (DECADRON) 4 MG tablet Take 1 tablet (4 mg total) by mouth 2 (two) times daily with a meal. 20 tablet 0   docusate sodium (COLACE) 100 MG capsule Take 100 mg by mouth daily.     DULoxetine (CYMBALTA) 30 MG capsule TAKE 1 CAPSULE BY MOUTH DAILY  FOR 7 DAYS, THEN 2 CAPSULES DAILY FOR 23 DAYS. 159 capsule 1   DULoxetine (CYMBALTA) 30 MG capsule Take by mouth.     fentaNYL (DURAGESIC) 25 MCG/HR Place 1 patch onto the skin every 3 (three) days. 5 patch 0   lidocaine-prilocaine (EMLA) cream Apply to affected area once 30 g 3   LORazepam (ATIVAN) 0.5 MG tablet Take 1 tablet (0.5 mg total) by mouth every 6 (six) hours as needed (Nausea or vomiting). (Patient not taking: Reported on 04/15/2022) 30 tablet 0   losartan (COZAAR) 25 MG tablet Take 1 tablet (25 mg total) by mouth daily. 30 tablet 3   metoprolol succinate (TOPROL XL) 25 MG 24 hr tablet Take 1 tablet (25 mg total) by mouth daily. 30 tablet 5   OLANZapine (ZYPREXA) 10 MG tablet TAKE 1 TABLET BY MOUTH EVERYDAY AT BEDTIME 90 tablet 1   ondansetron (ZOFRAN) 8 MG tablet Take 1 tablet (8 mg total) by mouth 2 (two) times daily as needed for refractory nausea / vomiting. (Patient not taking: Reported on 04/15/2022) 30 tablet 1   oxyCODONE (OXY IR/ROXICODONE) 5 MG immediate release tablet Take 1 tablet (5 mg total) by mouth every 4 (  four) hours as needed for moderate pain or severe pain. 60 tablet 0   prochlorperazine (COMPAZINE) 10 MG tablet Take 1 tablet (10 mg total) by mouth every 6 (six) hours as needed (Nausea or vomiting). (Patient not taking: Reported on 04/15/2022) 30 tablet 1   No current facility-administered medications for this visit.   Facility-Administered Medications Ordered in Other Visits  Medication Dose Route Frequency Provider Last Rate Last Admin   heparin lock flush 100 unit/mL  500 Units Intravenous Once Sindy Guadeloupe, MD       heparin lock flush 100 unit/mL  500 Units Intravenous Once Sindy Guadeloupe, MD       sodium chloride flush (NS) 0.9 % injection 10 mL  10 mL Intravenous PRN Sindy Guadeloupe, MD   10 mL at 05/01/20 0900   sodium chloride flush (NS) 0.9 % injection 10 mL  10 mL Intravenous PRN Sindy Guadeloupe, MD   10 mL at 01/29/21 0835    VITAL SIGNS: There were no  vitals taken for this visit. There were no vitals filed for this visit.   Estimated body mass index is 17.16 kg/m as calculated from the following:   Height as of 05/13/22: 5' 11"  (1.803 m).   Weight as of 05/27/22: 123 lb (55.8 kg).  LABS: CBC:    Component Value Date/Time   WBC 7.7 05/13/2022 0946   HGB 10.6 (L) 05/13/2022 0946   HCT 32.4 (L) 05/13/2022 0946   PLT 654 (H) 05/13/2022 0946   MCV 84.6 05/13/2022 0946   NEUTROABS 5.3 05/13/2022 0946   LYMPHSABS 1.2 05/13/2022 0946   MONOABS 1.0 05/13/2022 0946   EOSABS 0.1 05/13/2022 0946   BASOSABS 0.0 05/13/2022 0946   Comprehensive Metabolic Panel:    Component Value Date/Time   NA 129 (L) 05/13/2022 0946   K 3.4 (L) 05/13/2022 0946   CL 92 (L) 05/13/2022 0946   CO2 27 05/13/2022 0946   BUN 11 05/13/2022 0946   CREATININE 0.86 05/13/2022 0946   GLUCOSE 145 (H) 05/13/2022 0946   CALCIUM 9.0 05/13/2022 0946   AST 24 05/13/2022 0946   ALT 23 05/13/2022 0946   ALKPHOS 99 05/13/2022 0946   BILITOT 0.6 05/13/2022 0946   PROT 8.1 05/13/2022 0946   ALBUMIN 3.4 (L) 05/13/2022 0946    RADIOGRAPHIC STUDIES: DG SWALLOW FUNC SPEECH PATH  Result Date: 05/04/2022 Table formatting from the original result was not included. Images from the original result were not included. Objective Swallowing Evaluation: Type of Study: MBS-Modified Barium Swallow Study  Patient Details Name: Kyle Guerrero MRN: [id number] Date of Birth: 1971/05/13 Today's Date: 05/04/2022 Time: SLP Start Time (ACUTE ONLY): 1510 -SLP Stop Time (ACUTE ONLY): 1545 SLP Time Calculation (min) (ACUTE ONLY): 35 min Past Medical History: Past Medical History: Diagnosis Date  Asthma   Lung cancer ([place])  Past Surgical History: Past Surgical History: Procedure Laterality Date  CYST EXCISION    IR IMAGING GUIDED PORT INSERTION  12/05/2019  VIDEO BRONCHOSCOPY WITH ENDOBRONCHIAL ULTRASOUND Left 11/22/2019  Procedure: VIDEO BRONCHOSCOPY WITH ENDOBRONCHIAL ULTRASOUND;  Surgeon: Kyle Pita, MD;  Location: [facility] ORS;  Service: Thoracic;  Laterality: Left; HPI: Patient is a 51 y.o. male with metastatic adenocarcinoma of the lung with disease progression around cervical spine. Specifically, MRI of the cervical spine which showed a 4.6 x 4.8 x 8.2 cm mass in the left prevertebral region from C2-C7 levels.  The mass partially encases the left vertebral artery and abuts the preforaminal segment.  Invades the vertebral bodies and edema of the left C5 articular pillar.  There is slight extension into the left C4-C5 thecal sac without central thecal sac compression. Pt to receive palliative docetaxel treatments. Pt's appettiate remains poor and is currently taking 11m olanzapine for appetite stimulation. Chart reveals that pt received 30 Gy to his left cervical spine with last treatment on 10/11/2021.  Subjective: pt pleasant, conversant, known to this writer  Recommendations for follow up therapy are one component of a multi-disciplinary discharge planning process, led by the attending physician.  Recommendations may be updated based on patient status, additional functional criteria and insurance authorization. Assessment / Plan / Recommendation   05/04/2022   2:00 PM Clinical Impressions Clinical Impression Pt presents with moderate pharyngoesophageal dysphagia.  Cervical spine fusion hardware is noted. As such, penetration and aspiration is confirmed with pt in oblique position. Pt's esophagus is grossly patent. This prevents him from generating sufficient pressure required to propell boluses thru the pharynx. As a result, pt produces multiple swallows per bolus with moderate pharyngeal residue remaining after each swallow. While pt does intermittently aspirate before the swallowing, he consistently penetrates and aspiration pharyngeal residue post swallow. All aspiration is silent. However, when cued to cough, pt produces a strong cough that expells ALL aspirates. While pt is at a high risk of  aspirating, he was able to return demonstrate sip, swallow, cough over 10 trials after the study. Pt also demontrated good comprehension of aspiration and the need to utilize a cough to reduce risk of aspiration. At this time, recommend pt continue a regular diet with thin liquids via cup using strategy SIP, SWALLOW, COUGH. Should pt experience any respiratory decline, recommend re-evaluation to assess for potential diet modification at that time. SLP Visit Diagnosis Dysphagia, pharyngoesophageal phase (R13.14) Impact on safety and function Moderate aspiration risk     05/04/2022   2:00 PM Treatment Recommendations Treatment Recommendations No treatment recommended at this time      View : No data to display.      05/04/2022   2:00 PM Diet Recommendations SLP Diet Recommendations Regular solids;Thin liquid Liquid Administration via Cup Medication Administration Whole meds with liquid Compensations Minimize environmental distractions;Slow rate;Small sips/bites Postural Changes Seated upright at 90 degrees     05/04/2022   2:00 PM Other Recommendations Oral Care Recommendations Oral care BID Follow Up Recommendations No SLP follow up    View : No data to display.        05/04/2022   2:00 PM [facility]   05/04/2022   2:00 PM Pharyngeal Phase Pharyngeal Phase Impaired Pharyngeal- Nectar Teaspoon Reduced pharyngeal peristalsis;Reduced epiglottic inversion;Reduced anterior laryngeal mobility;Reduced laryngeal elevation;Reduced airway/laryngeal closure;Penetration/Aspiration before swallow;Penetration/Apiration after swallow;Pharyngeal residue - valleculae;Pharyngeal residue - pyriform;Pharyngeal residue - posterior pharnyx Pharyngeal Material enters airway, remains ABOVE vocal cords and not ejected out Pharyngeal- Nectar Cup Reduced pharyngeal peristalsis;Reduced epiglottic inversion;Reduced anterior laryngeal mobility;Reduced laryngeal elevation;Reduced airway/laryngeal closure;Penetration/Aspiration before  swallow;Penetration/Apiration after swallow;Pharyngeal residue - valleculae;Pharyngeal residue - pyriform;Pharyngeal residue - posterior pharnyx Pharyngeal Material enters airway, remains ABOVE vocal cords and not ejected out;Material enters airway, passes BELOW cords without attempt by patient to eject out (silent aspiration) Pharyngeal- Thin Teaspoon Reduced pharyngeal peristalsis;Reduced epiglottic inversion;Reduced anterior laryngeal mobility;Reduced laryngeal elevation;Reduced airway/laryngeal closure;Penetration/Aspiration before swallow;Penetration/Apiration after swallow;Pharyngeal residue - valleculae;Pharyngeal residue - pyriform;Pharyngeal residue - posterior pharnyx Pharyngeal Material enters airway, passes BELOW cords without attempt by patient to eject out (silent aspiration) Pharyngeal- Thin Cup Reduced pharyngeal peristalsis;Reduced epiglottic inversion;Reduced anterior  laryngeal mobility;Reduced laryngeal elevation;Reduced airway/laryngeal closure;Penetration/Aspiration before swallow;Penetration/Apiration after swallow;Pharyngeal residue - valleculae;Pharyngeal residue - pyriform;Pharyngeal residue - posterior pharnyx Pharyngeal Material enters airway, remains ABOVE vocal cords and not ejected out;Material enters airway, passes BELOW cords without attempt by patient to eject out (silent aspiration) Pharyngeal- Puree Reduced pharyngeal peristalsis;Reduced epiglottic inversion;Reduced anterior laryngeal mobility;Reduced laryngeal elevation;Reduced airway/laryngeal closure;Penetration/Aspiration before swallow;Penetration/Apiration after swallow;Pharyngeal residue - valleculae;Pharyngeal residue - pyriform;Pharyngeal residue - posterior pharnyx Pharyngeal Material enters airway, remains ABOVE vocal cords and not ejected out;Material enters airway, passes BELOW cords without attempt by patient to eject out (silent aspiration) Pharyngeal- Pill [facility]    05/04/2022   2:00 PM Cervical Esophageal Phase   Cervical Esophageal Phase Impaired Nectar Teaspoon Esophageal backflow into the pharynx Nectar Cup Esophageal backflow into cervical esophagus;Esophageal backflow into the pharynx Thin Teaspoon Esophageal backflow into cervical esophagus;Esophageal backflow into the pharynx Thin Cup Esophageal backflow into cervical esophagus;Esophageal backflow into the pharynx Puree Esophageal backflow into cervical esophagus;Esophageal backflow into the pharynx Pill -- Happi B. Rutherford Nail M.S., CCC-SLP, [place] Office [phone] Stormy Fabian 05/04/2022, 2:37 PM                      PERFORMANCE STATUS (ECOG) : 2 - Symptomatic, <50% confined to bed  Review of Systems Unless otherwise noted, a complete review of systems is negative.  Physical Exam General: NAD Cardiovascular: regular rate and rhythm Pulmonary: clear ant fields Abdomen: soft, nontender, + bowel sounds GU: no suprapubic tenderness Extremities: no edema, no joint deformities Skin: no rashes Neurological: Left-sided weakness  IMPRESSION: Routine follow-up visit.  Patient seen last week for rapidly enlarging neck mass, which had previously underwent resection at [facility] and XRT.  Dr. Janese Banks plans to start chemotherapy today.  Message was sent to Dr. Baruch Gouty to see if XRT would be an option. Hospice had been discussed but patient wanted to proceed with additional treatments.   Counts okay to proceed with chemotherapy today.  He is mildly hypokalemic and hyponatremic today.  We will give IV fluids and replete K.  Check serum mag.  Symptomatically, he reports improved pain after restarting fentanyl.  He says he is taking oxycodone once or twice a day and it is helping.  He denies other distressing symptoms.  However, he continues to endorse bilateral upper extremity weakness making his self-care difficult at home. Will see if Gwenette Greet can see him in our clinic and make a referral to home health for  PT/OT.  PLAN: -Continue current scope of treatment -Chemotherapy with docetaxel today/Udenyca on 6/9 -Continue oxycodone/fentanyl -Continue daily bowel regimen -Referral home health PT/OT, rehab screening -Patient to follow-up with Dr. Janese Banks on 6/13.  Telephone visit with me in 2 to 3 weeks   Patient expressed understanding and was in agreement with this plan. He also understands that He can call the clinic at any time with any questions, concerns, or complaints.     Time Total: 15 minutes  Visit consisted of counseling and education dealing with the complex and emotionally intense issues of symptom management and palliative care in the setting of serious and potentially life-threatening illness.Greater than 50%  of this time was spent counseling and coordinating care related to the above assessment and plan.  Signed by: Altha Harm, PhD, NP-C

## 2022-06-01 NOTE — Progress Notes (Signed)
Returns for follow-up and chemotherapy today. States that he is utilizing his pain medication. Rates pain 6/10. Reports dizziness this AM.

## 2022-06-01 NOTE — Progress Notes (Signed)
HR 103. Per Billey Chang NP okay to proceed with treatment today

## 2022-06-03 ENCOUNTER — Inpatient Hospital Stay: Payer: 59

## 2022-06-03 DIAGNOSIS — E871 Hypo-osmolality and hyponatremia: Secondary | ICD-10-CM | POA: Diagnosis not present

## 2022-06-03 DIAGNOSIS — Z87891 Personal history of nicotine dependence: Secondary | ICD-10-CM | POA: Diagnosis not present

## 2022-06-03 DIAGNOSIS — C770 Secondary and unspecified malignant neoplasm of lymph nodes of head, face and neck: Secondary | ICD-10-CM | POA: Diagnosis not present

## 2022-06-03 DIAGNOSIS — J45909 Unspecified asthma, uncomplicated: Secondary | ICD-10-CM | POA: Diagnosis not present

## 2022-06-03 DIAGNOSIS — R221 Localized swelling, mass and lump, neck: Secondary | ICD-10-CM | POA: Diagnosis not present

## 2022-06-03 DIAGNOSIS — Z5111 Encounter for antineoplastic chemotherapy: Secondary | ICD-10-CM | POA: Diagnosis not present

## 2022-06-03 DIAGNOSIS — C7951 Secondary malignant neoplasm of bone: Secondary | ICD-10-CM | POA: Diagnosis not present

## 2022-06-03 DIAGNOSIS — Z5189 Encounter for other specified aftercare: Secondary | ICD-10-CM | POA: Diagnosis not present

## 2022-06-03 DIAGNOSIS — M542 Cervicalgia: Secondary | ICD-10-CM | POA: Diagnosis not present

## 2022-06-03 DIAGNOSIS — C3412 Malignant neoplasm of upper lobe, left bronchus or lung: Secondary | ICD-10-CM

## 2022-06-03 DIAGNOSIS — Z79899 Other long term (current) drug therapy: Secondary | ICD-10-CM | POA: Diagnosis not present

## 2022-06-03 DIAGNOSIS — Z923 Personal history of irradiation: Secondary | ICD-10-CM | POA: Diagnosis not present

## 2022-06-03 DIAGNOSIS — Z7952 Long term (current) use of systemic steroids: Secondary | ICD-10-CM | POA: Diagnosis not present

## 2022-06-03 MED ORDER — PEGFILGRASTIM-BMEZ 6 MG/0.6ML ~~LOC~~ SOSY
6.0000 mg | PREFILLED_SYRINGE | Freq: Once | SUBCUTANEOUS | Status: AC
  Administered 2022-06-03: 6 mg via SUBCUTANEOUS
  Filled 2022-06-03: qty 0.6

## 2022-06-07 ENCOUNTER — Encounter: Payer: Self-pay | Admitting: Oncology

## 2022-06-07 ENCOUNTER — Inpatient Hospital Stay (HOSPITAL_BASED_OUTPATIENT_CLINIC_OR_DEPARTMENT_OTHER): Payer: 59 | Admitting: Hospice and Palliative Medicine

## 2022-06-07 ENCOUNTER — Inpatient Hospital Stay (HOSPITAL_BASED_OUTPATIENT_CLINIC_OR_DEPARTMENT_OTHER): Payer: 59 | Admitting: Oncology

## 2022-06-07 ENCOUNTER — Encounter: Payer: Self-pay | Admitting: Radiation Oncology

## 2022-06-07 ENCOUNTER — Inpatient Hospital Stay: Payer: 59

## 2022-06-07 ENCOUNTER — Ambulatory Visit
Admission: RE | Admit: 2022-06-07 | Discharge: 2022-06-07 | Disposition: A | Discharge status: Home / Self Care | Payer: 59 | Source: Ambulatory Visit | Attending: Radiation Oncology | Admitting: Radiation Oncology

## 2022-06-07 VITALS — BP 103/69 | HR 133 | Temp 97.1°F | Resp 16 | Ht 71.0 in | Wt 118.1 lb

## 2022-06-07 VITALS — BP 98/71 | HR 116 | Temp 98.3°F | Resp 18 | Wt 119.9 lb

## 2022-06-07 DIAGNOSIS — Z515 Encounter for palliative care: Secondary | ICD-10-CM | POA: Diagnosis not present

## 2022-06-07 DIAGNOSIS — C7951 Secondary malignant neoplasm of bone: Secondary | ICD-10-CM | POA: Insufficient documentation

## 2022-06-07 DIAGNOSIS — E860 Dehydration: Secondary | ICD-10-CM

## 2022-06-07 DIAGNOSIS — J45909 Unspecified asthma, uncomplicated: Secondary | ICD-10-CM | POA: Insufficient documentation

## 2022-06-07 DIAGNOSIS — Z923 Personal history of irradiation: Secondary | ICD-10-CM | POA: Insufficient documentation

## 2022-06-07 DIAGNOSIS — R221 Localized swelling, mass and lump, neck: Secondary | ICD-10-CM

## 2022-06-07 DIAGNOSIS — I428 Other cardiomyopathies: Secondary | ICD-10-CM | POA: Insufficient documentation

## 2022-06-07 DIAGNOSIS — Z5189 Encounter for other specified aftercare: Secondary | ICD-10-CM | POA: Diagnosis not present

## 2022-06-07 DIAGNOSIS — Z87891 Personal history of nicotine dependence: Secondary | ICD-10-CM | POA: Insufficient documentation

## 2022-06-07 DIAGNOSIS — E871 Hypo-osmolality and hyponatremia: Secondary | ICD-10-CM | POA: Diagnosis not present

## 2022-06-07 DIAGNOSIS — Z5111 Encounter for antineoplastic chemotherapy: Secondary | ICD-10-CM | POA: Diagnosis not present

## 2022-06-07 DIAGNOSIS — Z7189 Other specified counseling: Secondary | ICD-10-CM | POA: Diagnosis not present

## 2022-06-07 DIAGNOSIS — M542 Cervicalgia: Secondary | ICD-10-CM | POA: Diagnosis not present

## 2022-06-07 DIAGNOSIS — C770 Secondary and unspecified malignant neoplasm of lymph nodes of head, face and neck: Secondary | ICD-10-CM | POA: Insufficient documentation

## 2022-06-07 DIAGNOSIS — Z7952 Long term (current) use of systemic steroids: Secondary | ICD-10-CM | POA: Diagnosis not present

## 2022-06-07 DIAGNOSIS — C3412 Malignant neoplasm of upper lobe, left bronchus or lung: Secondary | ICD-10-CM | POA: Insufficient documentation

## 2022-06-07 DIAGNOSIS — Z79899 Other long term (current) drug therapy: Secondary | ICD-10-CM | POA: Diagnosis not present

## 2022-06-07 DIAGNOSIS — Z08 Encounter for follow-up examination after completed treatment for malignant neoplasm: Secondary | ICD-10-CM | POA: Diagnosis not present

## 2022-06-07 LAB — CBC WITH DIFFERENTIAL/PLATELET
Abs Immature Granulocytes: 0.53 10*3/uL — ABNORMAL HIGH (ref 0.00–0.07)
Basophils Absolute: 0.1 10*3/uL (ref 0.0–0.1)
Basophils Relative: 1 %
Eosinophils Absolute: 0.1 10*3/uL (ref 0.0–0.5)
Eosinophils Relative: 1 %
HCT: 30 % — ABNORMAL LOW (ref 39.0–52.0)
Hemoglobin: 9.6 g/dL — ABNORMAL LOW (ref 13.0–17.0)
Immature Granulocytes: 7 %
Lymphocytes Relative: 9 %
Lymphs Abs: 0.7 10*3/uL (ref 0.7–4.0)
MCH: 26.7 pg (ref 26.0–34.0)
MCHC: 32 g/dL (ref 30.0–36.0)
MCV: 83.6 fL (ref 80.0–100.0)
Monocytes Absolute: 0.6 10*3/uL (ref 0.1–1.0)
Monocytes Relative: 8 %
Neutro Abs: 5.8 10*3/uL (ref 1.7–7.7)
Neutrophils Relative %: 74 %
Platelets: 478 10*3/uL — ABNORMAL HIGH (ref 150–400)
RBC: 3.59 MIL/uL — ABNORMAL LOW (ref 4.22–5.81)
RDW: 15.4 % (ref 11.5–15.5)
WBC: 7.7 10*3/uL (ref 4.0–10.5)
nRBC: 0 % (ref 0.0–0.2)

## 2022-06-07 LAB — COMPREHENSIVE METABOLIC PANEL
ALT: 28 U/L (ref 0–44)
AST: 36 U/L (ref 15–41)
Albumin: 3.1 g/dL — ABNORMAL LOW (ref 3.5–5.0)
Alkaline Phosphatase: 98 U/L (ref 38–126)
Anion gap: 9 (ref 5–15)
BUN: 21 mg/dL — ABNORMAL HIGH (ref 6–20)
CO2: 28 mmol/L (ref 22–32)
Calcium: 9.1 mg/dL (ref 8.9–10.3)
Chloride: 92 mmol/L — ABNORMAL LOW (ref 98–111)
Creatinine, Ser: 1.09 mg/dL (ref 0.61–1.24)
GFR, Estimated: >60 mL/min (ref >60)
Glucose, Bld: 126 mg/dL — ABNORMAL HIGH (ref 70–99)
Potassium: 3.4 mmol/L — ABNORMAL LOW (ref 3.5–5.1)
Sodium: 129 mmol/L — ABNORMAL LOW (ref 135–145)
Total Bilirubin: 0.8 mg/dL (ref 0.3–1.2)
Total Protein: 7.5 g/dL (ref 6.5–8.1)

## 2022-06-07 MED ORDER — HEPARIN SOD (PORK) LOCK FLUSH 100 UNIT/ML IV SOLN
500.0000 [IU] | Freq: Once | INTRAVENOUS | Status: AC
  Administered 2022-06-07: 500 [IU] via INTRAVENOUS
  Filled 2022-06-07: qty 5

## 2022-06-07 MED ORDER — SODIUM CHLORIDE 0.9 % IV SOLN
Freq: Once | INTRAVENOUS | Status: AC
  Filled 2022-06-07: qty 250

## 2022-06-07 MED ORDER — SODIUM CHLORIDE 0.9% FLUSH
10.0000 mL | Freq: Once | INTRAVENOUS | Status: AC
  Administered 2022-06-07: 10 mL via INTRAVENOUS
  Filled 2022-06-07: qty 10

## 2022-06-07 NOTE — Progress Notes (Unsigned)
Rt arm went completely out about 3-4 days ago; goes completely dizzy when standin up; will like to know if he can have a wheelchair.

## 2022-06-07 NOTE — Progress Notes (Signed)
Hematology/Oncology Consult note [facility]  Telephone:([phone] [phone]  Patient Care Team: Pcp, No as PCP - General Kate Sable, MD as PCP - Cardiology (Cardiology) Telford Nab, RN as Registered Nurse Sindy Guadeloupe, MD as Consulting Physician (Hematology and Oncology) Sindy Guadeloupe, MD as Consulting Physician (Hematology and Oncology)   Name of the patient: Kyle Guerrero  [id number]  [birth year]   Date of visit: 06/07/22  Diagnosis- Non-small cell lung cancer stage IV acT2 cN2 cM1 a with pleural involvement as well as large cervical neck mass  Chief complaint/ Reason for visit-discuss further goals of care  Heme/Onc history: patient is a 51 year old male who presented to the ER with symptoms of heaviness in his chest and upper left chest discomfort he underwent CT angio chest to rule out PE which showed 3.8 x 3.3 cm left upper palpable lung mass along with 4.4 x 3.3 cm lobulated mass in the aortopulmonary window and 2.6 x 2.4 cm left hilar mass all concerning for malignancy.  Patient has also seen pulmonary and has been set up for bronchoscopy and EBUS guided biopsy on 11/23/2019.  Patient underwent.  PET CT scan which showed a hypermetabolic spiculated 3.5 cm of 5 left upper lobe lung mass, adjacent hypermetabolic 3.2 x 1.2 cm pleural metastases in the medial posterior by the left pleural space along with scalloping of the adjacent posterior left third rib.  Hypermetabolic infiltrative left perihilar conglomerate nodal metastases measuring up to 7.3 x 3.6 cm and 0.8 cm high left mediastinal node between the left brachiocephalic vein and left subclavian artery.  No evidence of distant metastatic disease   Biopsy showed non-small cell lung cancer but further characterization could not be determined.  Insufficient tissue for NGS testing. Repeat biopsy done. Results of NGS testing showed PD-L1 50%.  Tumor mutational burden high.  ERBB2 copy  number again. 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    Patient completed concurrent chemoradiation with carbotaxol chemotherapy followed by 2 cycles of carbotaxol Keytruda and was on maintenance Keytruda   Patient was complaining of left shoulder pain and underwent MRI of the shoulder which showed tear in the supraspinatus tendon.  He was subsequently seen by [facility] and underwent MRI of the cervical spine which showed a 4.6 x 4.8 x 8.2 cm mass in the left prevertebral region from C2-C7 levels.  The mass partially encases the left vertebral artery and abuts the preforaminal segment.  Invades the vertebral bodies and edema of the left C5 articular pillar.  There is slight extension into the left C4-C5 thecal sac without central thecal sac compression.  The mass displaces the left pharynx anteriorly along the left carotid sheath.  Patient had a repeat biopsy which was consistent with adenocarcinoma.   Patient underwent palliative radiation treatment to his neck mass and completed 4 cycles of carboplatin and [name] with excellent response to treatment and is currently on maintenance [name].  Repeat NGS testing was also performed which did not show any evidence of actionable mutations other than ERBB2 gain   Patient had recurrence of his neck pain and repeat imaging showedInterim increase in the size of his neck mass and patient went for second opinion for both medical oncology as well as neurosurgery continue current was decided to cooperate upon this mass for definitive resection.  Patient underwent resection of cervical tumor which showed poorly differentiated metastatic adenocarcinoma on 08/29/2021.  TTF-1 negative.   Patient received 3-4 cycles of Enhertu  but had a drop in his ejection fraction which did not recover.  Scan showed evidence of progression and patient was switched to docetaxel    Interval history-patient received docetaxel chemotherapy about a week ago.  Overall patient  is doing poorly.  Appetite is poor.  He has lost complete function of his right arm as well.  He spends most of his time in bed  ECOG PS- 3 Pain scale- 4 Opioid associated constipation- no  Review of systems- Review of Systems  Constitutional:  Positive for malaise/fatigue and weight loss. Negative for chills and fever.  HENT:  Negative for congestion, ear discharge and nosebleeds.   Eyes:  Negative for blurred vision.  Respiratory:  Negative for cough, hemoptysis, sputum production, shortness of breath and wheezing.   Cardiovascular:  Negative for chest pain, palpitations, orthopnea and claudication.  Gastrointestinal:  Negative for abdominal pain, blood in stool, constipation, diarrhea, heartburn, melena, nausea and vomiting.  Genitourinary:  Negative for dysuria, flank pain, frequency, hematuria and urgency.  Musculoskeletal:  Negative for back pain, joint pain and myalgias.       Neck pain and bilateral upper extremity weakness  Skin:  Negative for rash.  Neurological:  Negative for dizziness, tingling, focal weakness, seizures, weakness and headaches.  Endo/Heme/Allergies:  Does not bruise/bleed easily.  Psychiatric/Behavioral:  Negative for depression and suicidal ideas. The patient does not have insomnia.       No Known Allergies   Past Medical History:  Diagnosis Date   Asthma    Lung cancer ([place])      Past Surgical History:  Procedure Laterality Date   CYST EXCISION     IR IMAGING GUIDED PORT INSERTION  12/05/2019   VIDEO BRONCHOSCOPY WITH ENDOBRONCHIAL ULTRASOUND Left 11/22/2019   Procedure: VIDEO BRONCHOSCOPY WITH ENDOBRONCHIAL ULTRASOUND;  Surgeon: Tyler Pita, MD;  Location: [facility] ORS;  Service: Thoracic;  Laterality: Left;    Social History   Socioeconomic History   Marital status: Single    Spouse name: Not on file   Number of children: Not on file   Years of education: Not on file   Highest education level: Not on file  Occupational History    Not on file  Tobacco Use   Smoking status: Former    Packs/day: 2.00    Types: Cigarettes    Quit date: 11/08/2019    Years since quitting: 2.5   Smokeless tobacco: Never  Vaping Use   Vaping Use: Never used  Substance and Sexual Activity   Alcohol use: Not Currently   Drug use: Yes    Types: Marijuana   Sexual activity: Not Currently  Other Topics Concern   Not on file  Social History Narrative   Not on file   Social Determinants of Health   Financial Resource Strain: Not on file  Food Insecurity: Not on file  Transportation Needs: No Transportation Needs (05/27/2022)   PRAPARE - Hydrologist (Medical): No    Lack of Transportation (Non-Medical): No  Physical Activity: Not on file  Stress: Not on file  Social Connections: Not on file  Intimate Partner Violence: Not on file    Family History  Problem Relation Age of Onset   Healthy Mother    Healthy Father      Current Outpatient Medications:    baclofen (LIORESAL) 10 MG tablet, TAKE 1 TABLET BY MOUTH THREE TIMES A DAY, Disp: 270 tablet, Rfl: 1   dexamethasone (DECADRON) 4 MG tablet, Take 1  tablet (4 mg total) by mouth 2 (two) times daily with a meal., Disp: 20 tablet, Rfl: 0   docusate sodium (COLACE) 100 MG capsule, Take 100 mg by mouth daily., Disp: , Rfl:    DULoxetine (CYMBALTA) 30 MG capsule, TAKE 1 CAPSULE BY MOUTH DAILY FOR 7 DAYS, THEN 2 CAPSULES DAILY FOR 23 DAYS., Disp: 159 capsule, Rfl: 1   fentaNYL (DURAGESIC) 25 MCG/HR, Place 1 patch onto the skin every 3 (three) days., Disp: 5 patch, Rfl: 0   lidocaine-prilocaine (EMLA) cream, Apply to affected area once, Disp: 30 g, Rfl: 3   LORazepam (ATIVAN) 0.5 MG tablet, Take 1 tablet (0.5 mg total) by mouth every 6 (six) hours as needed (Nausea or vomiting)., Disp: 30 tablet, Rfl: 0   losartan (COZAAR) 25 MG tablet, Take 1 tablet (25 mg total) by mouth daily., Disp: 30 tablet, Rfl: 3   metoprolol succinate (TOPROL XL) 25 MG 24 hr tablet,  Take 1 tablet (25 mg total) by mouth daily., Disp: 30 tablet, Rfl: 5   OLANZapine (ZYPREXA) 10 MG tablet, TAKE 1 TABLET BY MOUTH EVERYDAY AT BEDTIME, Disp: 90 tablet, Rfl: 1   ondansetron (ZOFRAN) 8 MG tablet, Take 1 tablet (8 mg total) by mouth 2 (two) times daily as needed for refractory nausea / vomiting., Disp: 30 tablet, Rfl: 1   oxyCODONE (OXY IR/ROXICODONE) 5 MG immediate release tablet, Take 1 tablet (5 mg total) by mouth every 4 (four) hours as needed for moderate pain or severe pain., Disp: 60 tablet, Rfl: 0   DULoxetine (CYMBALTA) 30 MG capsule, Take by mouth. (Patient not taking: Reported on 06/07/2022), Disp: , Rfl:    prochlorperazine (COMPAZINE) 10 MG tablet, Take 1 tablet (10 mg total) by mouth every 6 (six) hours as needed (Nausea or vomiting). (Patient not taking: Reported on 04/15/2022), Disp: 30 tablet, Rfl: 1 No current facility-administered medications for this visit.  Facility-Administered Medications Ordered in Other Visits:    heparin lock flush 100 unit/mL, 500 Units, Intravenous, Once, Sindy Guadeloupe, MD   heparin lock flush 100 unit/mL, 500 Units, Intravenous, Once, Sindy Guadeloupe, MD   sodium chloride flush (NS) 0.9 % injection 10 mL, 10 mL, Intravenous, PRN, Sindy Guadeloupe, MD, 10 mL at 05/01/20 0900   sodium chloride flush (NS) 0.9 % injection 10 mL, 10 mL, Intravenous, PRN, Sindy Guadeloupe, MD, 10 mL at 01/29/21 0835  Physical exam:  Vitals:   06/07/22 1111  BP: 98/71  Pulse: (!) 116  Resp: 18  Temp: 98.3 F (36.8 C)  SpO2: 100%  Weight: 119 lb 14.4 oz (54.4 kg)   Physical Exam Constitutional:      Comments: He is thin and cachectic.  Appears fatigued  Cardiovascular:     Rate and Rhythm: Regular rhythm. Tachycardia present.     Heart sounds: Normal heart sounds.  Pulmonary:     Effort: Pulmonary effort is normal.     Breath sounds: Normal breath sounds.  Skin:    General: Skin is warm and dry.  Neurological:     Mental Status: He is alert and  oriented to person, place, and time.     Comments: No strength in his bilateral upper extremities         Latest Ref Rng & Units 06/07/2022   10:07 AM  CMP  Glucose 70 - 99 mg/dL 126   BUN 6 - 20 mg/dL 21   Creatinine 0.61 - 1.24 mg/dL 1.09   Sodium 135 - 145 mmol/L 129  Potassium 3.5 - 5.1 mmol/L 3.4   Chloride 98 - 111 mmol/L 92   CO2 22 - 32 mmol/L 28   Calcium 8.9 - 10.3 mg/dL 9.1   Total Protein 6.5 - 8.1 g/dL 7.5   Total Bilirubin 0.3 - 1.2 mg/dL 0.8   Alkaline Phos 38 - 126 U/L 98   AST 15 - 41 U/L 36   ALT 0 - 44 U/L 28       Latest Ref Rng & Units 06/07/2022   10:07 AM  CBC  WBC 4.0 - 10.5 K/uL 7.7   Hemoglobin 13.0 - 17.0 g/dL 9.6   Hematocrit 39.0 - 52.0 % 30.0   Platelets 150 - 400 K/uL 478      Assessment and plan- Patient is a 51 y.o. male with metastatic adenocarcinoma of the lung and progression on multiple lines of chemotherapy.  Most recently on fourth line docetaxel chemotherapy  Patient's performance status has declined significantly over the last 1 week.  He has now lost complete strength in his right upper extremity as well.  Appetite is poor and I do not think that he can tolerate any further systemic therapy or palliative radiation.  I have discussed all this in detail with the patient and his uncle.  Presently patient is living with his girlfriend and extended family is willing to help him out on a day-to-day basis as well.  I suspect his his prognosis is likely in days to weeks.  I recommend home hospice at this time which patient is agreeable to.  He is already a DNR.  He will be meeting NP Altha Harm today as well.   Visit Diagnosis 1. Goals of care, counseling/discussion   2. Malignant neoplasm of upper lobe of left lung ([place])      Dr. Randa Evens, MD, MPH [facility] [phone] 06/07/2022 7:39 PM

## 2022-06-07 NOTE — Progress Notes (Signed)
[place] at [facility] [phone] [phone]   Name: Kyle Guerrero Date: 06/07/2022 MRN: [id number]  DOB: [birth year]  Patient Care Team: Pcp, No as PCP - General Kate Sable, MD as PCP - Cardiology (Cardiology) Telford Nab, RN as Registered Nurse Sindy Guadeloupe, MD as Consulting Physician (Hematology and Oncology) Sindy Guadeloupe, MD as Consulting Physician (Hematology and Oncology)    REASON FOR CONSULTATION: Kyle Guerrero is a 51 y.o. male with multiple medical problems including stage IV non-small cell lung cancer with pleural involvement and a recurrent large cervical neck mass.  Patient is status post concurrent chemoradiation and underwent surgical resection of his spinal mass.  Unfortunately, he has had residual upper extremity weakness.  He has had ongoing pain.  Palliative care was consulted to help address goals and manage ongoing symptoms.  SOCIAL HISTORY:     reports that he quit smoking about 2 years ago. His smoking use included cigarettes. He smoked an average of 2 packs per day. He has never used smokeless tobacco. He reports that he does not currently use alcohol. He reports current drug use. Drug: Marijuana.  Patient is unmarried.  He lives at home alone.  He does have a significant other/girlfriend who is involved and sees him daily.  His parents are deceased.  He has a sister from whom he is reportedly estranged.  Patient previously worked in a [employer].  ADVANCE DIRECTIVES:  Does not have  CODE STATUS: DNR/DNI (DNR form signed on 05/27/2022)  PAST MEDICAL HISTORY: Past Medical History:  Diagnosis Date   Asthma    Lung cancer ([place])     PAST SURGICAL HISTORY:  Past Surgical History:  Procedure Laterality Date   CYST EXCISION     IR IMAGING GUIDED PORT INSERTION  12/05/2019   VIDEO BRONCHOSCOPY WITH ENDOBRONCHIAL ULTRASOUND Left 11/22/2019   Procedure: VIDEO  BRONCHOSCOPY WITH ENDOBRONCHIAL ULTRASOUND;  Surgeon: Tyler Pita, MD;  Location: [facility] ORS;  Service: Thoracic;  Laterality: Left;    HEMATOLOGY/ONCOLOGY HISTORY:  Oncology History  Malignant neoplasm of upper lobe of left lung ([place])  11/29/2019 Initial Diagnosis   Malignant neoplasm of upper lobe of left lung ([place])   11/29/2019 Cancer Staging   Staging form: Lung, AJCC 8th Edition - Clinical stage from 11/29/2019: Stage IVA (cT2, cN2, cM1a) - Signed by Sindy Guadeloupe, MD on 11/29/2019   12/03/2019 - 01/16/2020 Chemotherapy   The patient had dexamethasone (DECADRON) 4 MG tablet, 8 mg, Oral, Daily, 1 of 1 cycle, Start date: 11/29/2019, End date: 02/04/2020 palonosetron (ALOXI) injection 0.25 mg, 0.25 mg, Intravenous,  Once, 7 of 7 cycles Administration: 0.25 mg (12/03/2019), 0.25 mg (12/12/2019), 0.25 mg (12/19/2019), 0.25 mg (12/26/2019), 0.25 mg (01/02/2020), 0.25 mg (01/09/2020), 0.25 mg (01/16/2020) CARBOplatin (PARAPLATIN) 260 mg in sodium chloride 0.9 % 250 mL chemo infusion, 260 mg (100 % of original dose 262 mg), Intravenous,  Once, 7 of 7 cycles Dose modification:   (original dose 262 mg, Cycle 1) Administration: 260 mg (12/03/2019), 260 mg (12/12/2019), 290 mg (12/19/2019), 290 mg (12/26/2019), 290 mg (01/02/2020), 290 mg (01/09/2020), 290 mg (01/16/2020) PACLitaxel (TAXOL) 84 mg in sodium chloride 0.9 % 250 mL chemo infusion (</= 59m/m2), 45 mg/m2 = 84 mg, Intravenous,  Once, 7 of 7 cycles Administration: 84 mg (12/03/2019), 84 mg (12/12/2019), 84 mg (12/19/2019), 84 mg (12/26/2019), 84 mg (01/02/2020), 84 mg (01/09/2020), 84 mg (01/16/2020)  for chemotherapy treatment.    02/21/2020 -  07/24/2020 Chemotherapy   The patient had palonosetron (ALOXI) injection 0.25 mg, 0.25 mg, Intravenous,  Once, 2 of 2 cycles Administration: 0.25 mg (02/21/2020), 0.25 mg (03/13/2020) pegfilgrastim (NEULASTA ONPRO KIT) injection 6 mg, 6 mg, Subcutaneous, Once, 2 of 2 cycles Administration: 6 mg (02/21/2020), 6 mg  (03/13/2020) CARBOplatin (PARAPLATIN) 750 mg in sodium chloride 0.9 % 250 mL chemo infusion, 750 mg (114 % of original dose 658 mg), Intravenous,  Once, 2 of 2 cycles Dose modification:   (original dose 658 mg, Cycle 1) Administration: 750 mg (02/21/2020), 670 mg (03/13/2020) fosaprepitant (EMEND) 150 mg in sodium chloride 0.9 % 145 mL IVPB, 150 mg, Intravenous,  Once, 2 of 2 cycles Administration: 150 mg (02/21/2020), 150 mg (03/13/2020) PACLitaxel (TAXOL) 342 mg in sodium chloride 0.9 % 500 mL chemo infusion (> 33m/m2), 175 mg/m2 = 342 mg (100 % of original dose 175 mg/m2), Intravenous,  Once, 2 of 2 cycles Dose modification: 175 mg/m2 (original dose 175 mg/m2, Cycle 1, Reason: Other (see comments), Comment: cytopenia) Administration: 342 mg (02/21/2020), 342 mg (03/13/2020) pembrolizumab (KEYTRUDA) 200 mg in sodium chloride 0.9 % 50 mL chemo infusion, 200 mg, Intravenous, Once, 8 of 9 cycles Administration: 200 mg (02/21/2020), 200 mg (04/10/2020), 200 mg (05/01/2020), 200 mg (03/13/2020), 200 mg (05/22/2020), 200 mg (06/12/2020), 200 mg (07/03/2020), 200 mg (07/24/2020)  for chemotherapy treatment.    08/21/2020 - 06/25/2021 Chemotherapy   Patient is on Treatment Plan : LUNG NSCLC Pemetrexed ([name]) / Carboplatin q21d x 4 cycles     10/25/2021 - 01/05/2022 Chemotherapy   Patient is on Treatment Plan : Lung Fam-Trastuzumab Deruxtecan-nxki (Enhertu) (6.4) q21d     04/15/2022 -  Chemotherapy   Patient is on Treatment Plan : lung Docetaxel q21d       ALLERGIES:  has No Known Allergies.  MEDICATIONS:  Current Outpatient Medications  Medication Sig Dispense Refill   baclofen (LIORESAL) 10 MG tablet TAKE 1 TABLET BY MOUTH THREE TIMES A DAY 270 tablet 1   dexamethasone (DECADRON) 4 MG tablet Take 1 tablet (4 mg total) by mouth 2 (two) times daily with a meal. 20 tablet 0   docusate sodium (COLACE) 100 MG capsule Take 100 mg by mouth daily.     DULoxetine (CYMBALTA) 30 MG capsule TAKE 1 CAPSULE BY MOUTH DAILY  FOR 7 DAYS, THEN 2 CAPSULES DAILY FOR 23 DAYS. 159 capsule 1   DULoxetine (CYMBALTA) 30 MG capsule Take by mouth. (Patient not taking: Reported on 06/07/2022)     fentaNYL (DURAGESIC) 25 MCG/HR Place 1 patch onto the skin every 3 (three) days. 5 patch 0   lidocaine-prilocaine (EMLA) cream Apply to affected area once 30 g 3   LORazepam (ATIVAN) 0.5 MG tablet Take 1 tablet (0.5 mg total) by mouth every 6 (six) hours as needed (Nausea or vomiting). 30 tablet 0   losartan (COZAAR) 25 MG tablet Take 1 tablet (25 mg total) by mouth daily. 30 tablet 3   metoprolol succinate (TOPROL XL) 25 MG 24 hr tablet Take 1 tablet (25 mg total) by mouth daily. 30 tablet 5   OLANZapine (ZYPREXA) 10 MG tablet TAKE 1 TABLET BY MOUTH EVERYDAY AT BEDTIME 90 tablet 1   ondansetron (ZOFRAN) 8 MG tablet Take 1 tablet (8 mg total) by mouth 2 (two) times daily as needed for refractory nausea / vomiting. 30 tablet 1   oxyCODONE (OXY IR/ROXICODONE) 5 MG immediate release tablet Take 1 tablet (5 mg total) by mouth every 4 (four) hours as needed for moderate  pain or severe pain. 60 tablet 0   prochlorperazine (COMPAZINE) 10 MG tablet Take 1 tablet (10 mg total) by mouth every 6 (six) hours as needed (Nausea or vomiting). (Patient not taking: Reported on 04/15/2022) 30 tablet 1   No current facility-administered medications for this visit.   Facility-Administered Medications Ordered in Other Visits  Medication Dose Route Frequency Provider Last Rate Last Admin   heparin lock flush 100 unit/mL  500 Units Intravenous Once Sindy Guadeloupe, MD       heparin lock flush 100 unit/mL  500 Units Intravenous Once Sindy Guadeloupe, MD       sodium chloride flush (NS) 0.9 % injection 10 mL  10 mL Intravenous PRN Sindy Guadeloupe, MD   10 mL at 05/01/20 0900   sodium chloride flush (NS) 0.9 % injection 10 mL  10 mL Intravenous PRN Sindy Guadeloupe, MD   10 mL at 01/29/21 0835    VITAL SIGNS: There were no vitals taken for this visit. There were no  vitals filed for this visit.   Estimated body mass index is 16.72 kg/m as calculated from the following:   Height as of an earlier encounter on 06/07/22: _0  (1.803 m).   Weight as of an earlier encounter on 06/07/22: 119 lb 14.4 oz (54.4 kg).  LABS: CBC:    Component Value Date/Time   WBC 7.7 06/07/2022 1007   HGB 9.6 (L) 06/07/2022 1007   HCT 30.0 (L) 06/07/2022 1007   PLT 478 (H) 06/07/2022 1007   MCV 83.6 06/07/2022 1007   NEUTROABS 5.8 06/07/2022 1007   LYMPHSABS 0.7 06/07/2022 1007   MONOABS 0.6 06/07/2022 1007   EOSABS 0.1 06/07/2022 1007   BASOSABS 0.1 06/07/2022 1007   Comprehensive Metabolic Panel:    Component Value Date/Time   NA 129 (L) 06/07/2022 1007   K 3.4 (L) 06/07/2022 1007   CL 92 (L) 06/07/2022 1007   CO2 28 06/07/2022 1007   BUN 21 (H) 06/07/2022 1007   CREATININE 1.09 06/07/2022 1007   GLUCOSE 126 (H) 06/07/2022 1007   CALCIUM 9.1 06/07/2022 1007   AST 36 06/07/2022 1007   ALT 28 06/07/2022 1007   ALKPHOS 98 06/07/2022 1007   BILITOT 0.8 06/07/2022 1007   PROT 7.5 06/07/2022 1007   ALBUMIN 3.1 (L) 06/07/2022 1007    RADIOGRAPHIC STUDIES: No results found.  PERFORMANCE STATUS (ECOG) : 2 - Symptomatic, <50% confined to bed  Review of Systems Unless otherwise noted, a complete review of systems is negative.  Physical Exam General: NAD Cardiovascular: regular rate and rhythm Pulmonary: clear ant fields Abdomen: soft, nontender, + bowel sounds GU: no suprapubic tenderness Extremities: no edema, no joint deformities Skin: no rashes Neurological: Left-sided weakness  IMPRESSION: Patient was an add-on to my clinic schedule today after seeing Dr. Janese Banks.  Patient was accompanied by his uncle, Kyle Guerrero [phone]).  Unfortunately, patient appears to be declining after receiving chemotherapy last week.  He is progressively weaker and not eating much.  Dr. Janese Banks does not feel like patient could tolerate further systemic chemotherapy or  cancer treatments.  Hospice has been recommended.  I spoke with patient regarding hospice and focusing on comfort.  He wants to remain home if possible and his uncle feels like family will support him in that environment.  They recognize that hospice will not provide 24/7 care at home.  Again, his uncle assures me that family will assist.  Patient did verbalize agreement with hospice care.  I sent a referral to [facility].  Patient does have his DNR order at home.  PLAN: -Best supportive care -Referral to hospice -Continue oxycodone/fentanyl -Continue daily bowel regimen  Case and plan discussed with Dr. Janese Banks  Patient expressed understanding and was in agreement with this plan. He also understands that He can call the clinic at any time with any questions, concerns, or complaints.     Time Total: 20 minutes  Visit consisted of counseling and education dealing with the complex and emotionally intense issues of symptom management and palliative care in the setting of serious and potentially life-threatening illness.Greater than 50%  of this time was spent counseling and coordinating care related to the above assessment and plan.  Signed by: Altha Harm, PhD, NP-C

## 2022-06-07 NOTE — Progress Notes (Signed)
Radiation Oncology Follow up Note  Name: Kyle Guerrero   Date:   06/07/2022 MRN:  [id number] DOB: July 15, 1971    This 51 y.o. male presents to the clinic today for reevaluation of recurrent disease in his cervical spine region for metastatic squamous of carcinoma status post instrumentation and treatment x2 with radiation.  REFERRING PROVIDER: No ref. provider found  HPI: Patient is a 51 year old male now at 3 months having completed salvage radiation therapy to his cervical spine.  He has developed progressive metastatic disease in the cervical spine and treated initially with chemoradiation therapy for advanced disease.  He developed a 5 cm mass of the left paravertebral space with destruction of the C4-5 and 6 vertebral bodies.  He underwent anterior fusion instrumentation at C3-7.  We treated again with salvage radiation therapy although recently he has presented with increasing mass in this region.  Recent work-up at [facility] CT scan redemonstrated postsurgical changes from C2-T2 with a large central necrotic mass centered within the cervical spine invading the left thyroid lobe.  MRI of the cervical spine showed progressive cervical spine osseous metastatic disease with a new large soft tissue component involving majority the cervical spine neuroforamina prevertebral space with epidural extension most pronounced at C6-7 with moderate spinal stenosis.  There is encasement the right greater than left vertebral arteries.  I been asked to evaluate him for any possible further palliative treatment.  COMPLICATIONS OF TREATMENT: none  FOLLOW UP COMPLIANCE: keeps appointments   PHYSICAL EXAM:  BP 103/69 (BP Location: Right Arm, Patient Position: Sitting, Cuff Size: Small)   Pulse (!) 133   Temp (!) 97.1 F (36.2 C) (Tympanic)   Resp 16   Ht 5\' 11"  (1.803 m)   Wt 118 lb 1.6 oz (53.6 kg)   BMI 16.47 kg/m  Patient has some decrease strength in the left left upper extremities.  Large bulging  bulging mass in the right supraclavicular right cervical space.  Patient is frail anorexic.  Well-developed well-nourished patient in NAD. HEENT reveals PERLA, EOMI, discs not visualized.  Oral cavity is clear. No oral mucosal lesions are identified. Neck is clear without evidence of cervical or supraclavicular adenopathy. Lungs are clear to A&P. Cardiac examination is essentially unremarkable with regular rate and rhythm without murmur rub or thrill. Abdomen is benign with no organomegaly or masses noted. Motor sensory and DTR levels are equal and symmetric in the upper and lower extremities. Cranial nerves II through XII are grossly intact. Proprioception is intact. No peripheral adenopathy or edema is identified. No motor or sensory levels are noted. Crude visual fields are within normal range.  RADIOLOGY RESULTS: CT reports MRI reports reviewed PET CT scan ordered  PLAN: At this time patient is doing extremely poorly with multiple recurrent disease in areas previously treated x2 with radiation therapy.  I have ordered a PET CT scan to see if there is any rationale for further palliative treatment such as involvement of the spinal cord or pending cervical fracture.  I have discussed the case with medical oncology my #1 choice at this time would be hospice care based on his steady decline and persistent recurrent disease.  Once PET scan is reviewed we will make further recommendations.  I would like to take this opportunity to thank you for allowing me to participate in the care of your patient.Noreene Filbert, MD

## 2022-06-08 ENCOUNTER — Encounter: Payer: Self-pay | Admitting: Oncology

## 2022-06-08 DIAGNOSIS — C7951 Secondary malignant neoplasm of bone: Secondary | ICD-10-CM | POA: Diagnosis not present

## 2022-06-08 DIAGNOSIS — I428 Other cardiomyopathies: Secondary | ICD-10-CM | POA: Diagnosis not present

## 2022-06-08 DIAGNOSIS — C3490 Malignant neoplasm of unspecified part of unspecified bronchus or lung: Secondary | ICD-10-CM | POA: Diagnosis not present

## 2022-06-08 DIAGNOSIS — E46 Unspecified protein-calorie malnutrition: Secondary | ICD-10-CM | POA: Diagnosis not present

## 2022-06-08 DIAGNOSIS — C7989 Secondary malignant neoplasm of other specified sites: Secondary | ICD-10-CM | POA: Diagnosis not present

## 2022-06-08 DIAGNOSIS — I10 Essential (primary) hypertension: Secondary | ICD-10-CM | POA: Diagnosis not present

## 2022-06-08 DIAGNOSIS — Z681 Body mass index (BMI) 19 or less, adult: Secondary | ICD-10-CM | POA: Diagnosis not present

## 2022-06-08 DIAGNOSIS — J45909 Unspecified asthma, uncomplicated: Secondary | ICD-10-CM | POA: Diagnosis not present

## 2022-06-08 DIAGNOSIS — R69 Illness, unspecified: Secondary | ICD-10-CM | POA: Diagnosis not present

## 2022-06-09 DIAGNOSIS — J45909 Unspecified asthma, uncomplicated: Secondary | ICD-10-CM | POA: Diagnosis not present

## 2022-06-09 DIAGNOSIS — C7989 Secondary malignant neoplasm of other specified sites: Secondary | ICD-10-CM | POA: Diagnosis not present

## 2022-06-09 DIAGNOSIS — R69 Illness, unspecified: Secondary | ICD-10-CM | POA: Diagnosis not present

## 2022-06-09 DIAGNOSIS — E46 Unspecified protein-calorie malnutrition: Secondary | ICD-10-CM | POA: Diagnosis not present

## 2022-06-09 DIAGNOSIS — I428 Other cardiomyopathies: Secondary | ICD-10-CM | POA: Diagnosis not present

## 2022-06-09 DIAGNOSIS — I10 Essential (primary) hypertension: Secondary | ICD-10-CM | POA: Diagnosis not present

## 2022-06-09 DIAGNOSIS — Z681 Body mass index (BMI) 19 or less, adult: Secondary | ICD-10-CM | POA: Diagnosis not present

## 2022-06-09 DIAGNOSIS — C7951 Secondary malignant neoplasm of bone: Secondary | ICD-10-CM | POA: Diagnosis not present

## 2022-06-09 DIAGNOSIS — C3490 Malignant neoplasm of unspecified part of unspecified bronchus or lung: Secondary | ICD-10-CM | POA: Diagnosis not present

## 2022-06-10 ENCOUNTER — Other Ambulatory Visit: Payer: Self-pay | Admitting: Hospice and Palliative Medicine

## 2022-06-10 DIAGNOSIS — C7989 Secondary malignant neoplasm of other specified sites: Secondary | ICD-10-CM | POA: Diagnosis not present

## 2022-06-10 DIAGNOSIS — E46 Unspecified protein-calorie malnutrition: Secondary | ICD-10-CM | POA: Diagnosis not present

## 2022-06-10 DIAGNOSIS — I10 Essential (primary) hypertension: Secondary | ICD-10-CM | POA: Diagnosis not present

## 2022-06-10 DIAGNOSIS — C7951 Secondary malignant neoplasm of bone: Secondary | ICD-10-CM | POA: Diagnosis not present

## 2022-06-10 DIAGNOSIS — J45909 Unspecified asthma, uncomplicated: Secondary | ICD-10-CM | POA: Diagnosis not present

## 2022-06-10 DIAGNOSIS — R69 Illness, unspecified: Secondary | ICD-10-CM | POA: Diagnosis not present

## 2022-06-10 DIAGNOSIS — I428 Other cardiomyopathies: Secondary | ICD-10-CM | POA: Diagnosis not present

## 2022-06-10 DIAGNOSIS — C3490 Malignant neoplasm of unspecified part of unspecified bronchus or lung: Secondary | ICD-10-CM | POA: Diagnosis not present

## 2022-06-10 DIAGNOSIS — Z681 Body mass index (BMI) 19 or less, adult: Secondary | ICD-10-CM | POA: Diagnosis not present

## 2022-06-10 MED ORDER — FENTANYL 25 MCG/HR TD PT72: 1.0000 | MEDICATED_PATCH | TRANSDERMAL | 0 refills | Status: DC

## 2022-06-10 MED ORDER — TEMAZEPAM 15 MG PO CAPS: 15.0000 mg | ORAL_CAPSULE | Freq: Every evening | ORAL | 0 refills | Status: DC | PRN

## 2022-06-11 DIAGNOSIS — E46 Unspecified protein-calorie malnutrition: Secondary | ICD-10-CM | POA: Diagnosis not present

## 2022-06-11 DIAGNOSIS — C3490 Malignant neoplasm of unspecified part of unspecified bronchus or lung: Secondary | ICD-10-CM | POA: Diagnosis not present

## 2022-06-11 DIAGNOSIS — I428 Other cardiomyopathies: Secondary | ICD-10-CM | POA: Diagnosis not present

## 2022-06-11 DIAGNOSIS — R69 Illness, unspecified: Secondary | ICD-10-CM | POA: Diagnosis not present

## 2022-06-11 DIAGNOSIS — I10 Essential (primary) hypertension: Secondary | ICD-10-CM | POA: Diagnosis not present

## 2022-06-11 DIAGNOSIS — Z681 Body mass index (BMI) 19 or less, adult: Secondary | ICD-10-CM | POA: Diagnosis not present

## 2022-06-11 DIAGNOSIS — J45909 Unspecified asthma, uncomplicated: Secondary | ICD-10-CM | POA: Diagnosis not present

## 2022-06-11 DIAGNOSIS — C7989 Secondary malignant neoplasm of other specified sites: Secondary | ICD-10-CM | POA: Diagnosis not present

## 2022-06-11 DIAGNOSIS — C7951 Secondary malignant neoplasm of bone: Secondary | ICD-10-CM | POA: Diagnosis not present

## 2022-06-12 ENCOUNTER — Encounter: Payer: Self-pay | Admitting: Oncology

## 2022-06-12 DIAGNOSIS — Z681 Body mass index (BMI) 19 or less, adult: Secondary | ICD-10-CM | POA: Diagnosis not present

## 2022-06-12 DIAGNOSIS — C3490 Malignant neoplasm of unspecified part of unspecified bronchus or lung: Secondary | ICD-10-CM | POA: Diagnosis not present

## 2022-06-12 DIAGNOSIS — I428 Other cardiomyopathies: Secondary | ICD-10-CM | POA: Diagnosis not present

## 2022-06-12 DIAGNOSIS — E46 Unspecified protein-calorie malnutrition: Secondary | ICD-10-CM | POA: Diagnosis not present

## 2022-06-12 DIAGNOSIS — I10 Essential (primary) hypertension: Secondary | ICD-10-CM | POA: Diagnosis not present

## 2022-06-12 DIAGNOSIS — R69 Illness, unspecified: Secondary | ICD-10-CM | POA: Diagnosis not present

## 2022-06-12 DIAGNOSIS — J45909 Unspecified asthma, uncomplicated: Secondary | ICD-10-CM | POA: Diagnosis not present

## 2022-06-12 DIAGNOSIS — C7989 Secondary malignant neoplasm of other specified sites: Secondary | ICD-10-CM | POA: Diagnosis not present

## 2022-06-12 DIAGNOSIS — C7951 Secondary malignant neoplasm of bone: Secondary | ICD-10-CM | POA: Diagnosis not present

## 2022-06-12 NOTE — Progress Notes (Signed)
Hematology/Oncology Consult note [facility]  [phone] [phone]  Patient Care Team: Pcp, No as PCP - General Kate Sable, MD as PCP - Cardiology (Cardiology) Telford Nab, RN as Registered Nurse Sindy Guadeloupe, MD as Consulting Physician (Hematology and Oncology) Sindy Guadeloupe, MD as Consulting Physician (Hematology and Oncology)   Name of the patient: Kyle Guerrero  [id number]  02-05-1971   Date of visit: 06/12/22  Diagnosis- Non-small cell lung cancer stage IV acT2 cN2 cM1 a with pleural involvement as well as large cervical neck mass  Chief complaint/ Reason for visit-further goals of care  Heme/Onc history: patient is a 51 year old male who presented to the ER with symptoms of heaviness in his chest and upper left chest discomfort he underwent CT angio chest to rule out PE which showed 3.8 x 3.3 cm left upper palpable lung mass along with 4.4 x 3.3 cm lobulated mass in the aortopulmonary window and 2.6 x 2.4 cm left hilar mass all concerning for malignancy.  Patient has also seen pulmonary and has been set up for bronchoscopy and EBUS guided biopsy on 11/23/2019.  Patient underwent.  PET CT scan which showed a hypermetabolic spiculated 3.5 cm of 5 left upper lobe lung mass, adjacent hypermetabolic 3.2 x 1.2 cm pleural metastases in the medial posterior by the left pleural space along with scalloping of the adjacent posterior left third rib.  Hypermetabolic infiltrative left perihilar conglomerate nodal metastases measuring up to 7.3 x 3.6 cm and 0.8 cm high left mediastinal node between the left brachiocephalic vein and left subclavian artery.  No evidence of distant metastatic disease   Biopsy showed non-small cell lung cancer but further characterization could not be determined.  Insufficient tissue for NGS testing. Repeat biopsy done. Results of NGS testing showed PD-L1 50%.  Tumor mutational burden high.  ERBB2 copy number  again. 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    Patient completed concurrent chemoradiation with carbotaxol chemotherapy followed by 2 cycles of carbotaxol Keytruda and was on maintenance Keytruda   Patient was complaining of left shoulder pain and underwent MRI of the shoulder which showed tear in the supraspinatus tendon.  He was subsequently seen by [facility] and underwent MRI of the cervical spine which showed a 4.6 x 4.8 x 8.2 cm mass in the left prevertebral region from C2-C7 levels.  The mass partially encases the left vertebral artery and abuts the preforaminal segment.  Invades the vertebral bodies and edema of the left C5 articular pillar.  There is slight extension into the left C4-C5 thecal sac without central thecal sac compression.  The mass displaces the left pharynx anteriorly along the left carotid sheath.  Patient had a repeat biopsy which was consistent with adenocarcinoma.   Patient underwent palliative radiation treatment to his neck mass and completed 4 cycles of carboplatin and [name] with excellent response to treatment and is currently on maintenance [name].  Repeat NGS testing was also performed which did not show any evidence of actionable mutations other than ERBB2 gain   Patient had recurrence of his neck pain and repeat imaging showedInterim increase in the size of his neck mass and patient went for second opinion for both medical oncology as well as neurosurgery continue current was decided to cooperate upon this mass for definitive resection.  Patient underwent resection of cervical tumor which showed poorly differentiated metastatic adenocarcinoma on 08/29/2021.  TTF-1 negative.   Patient received 3-4 cycles of Enhertu but  had a drop in his ejection fraction which did not recover.  Scan showed evidence of progression and patient was switched to docetaxel      Interval history-patient is doing poorly overall now he has lost complete function of his right  arm as well and therefore unable to move his bilateral upper extremities.  He does not verbalize much today and is sort of stoic.  States that his pain has been under good control with his pain medications.  Continues to lose weight.  ECOG PS- 3-4 Pain scale- 3 Opioid associated constipation- no  Review of systems- Review of Systems  Constitutional:  Positive for malaise/fatigue and weight loss.  Neurological:  Positive for focal weakness (Bilateral upper extremity weakness).      No Known Allergies   Past Medical History:  Diagnosis Date   Asthma    Lung cancer ([place])      Past Surgical History:  Procedure Laterality Date   CYST EXCISION     IR IMAGING GUIDED PORT INSERTION  12/05/2019   VIDEO BRONCHOSCOPY WITH ENDOBRONCHIAL ULTRASOUND Left 11/22/2019   Procedure: VIDEO BRONCHOSCOPY WITH ENDOBRONCHIAL ULTRASOUND;  Surgeon: Tyler Pita, MD;  Location: [facility] ORS;  Service: Thoracic;  Laterality: Left;    Social History   Socioeconomic History   Marital status: Single    Spouse name: Not on file   Number of children: Not on file   Years of education: Not on file   Highest education level: Not on file  Occupational History   Not on file  Tobacco Use   Smoking status: Former    Packs/day: 2.00    Types: Cigarettes    Quit date: 11/08/2019    Years since quitting: 2.5   Smokeless tobacco: Never  Vaping Use   Vaping Use: Never used  Substance and Sexual Activity   Alcohol use: Not Currently   Drug use: Yes    Types: Marijuana   Sexual activity: Not Currently  Other Topics Concern   Not on file  Social History Narrative   Not on file   Social Determinants of Health   Financial Resource Strain: Not on file  Food Insecurity: Not on file  Transportation Needs: No Transportation Needs (05/27/2022)   PRAPARE - Hydrologist (Medical): No    Lack of Transportation (Non-Medical): No  Physical Activity: Not on file  Stress: Not on  file  Social Connections: Not on file  Intimate Partner Violence: Not on file    Family History  Problem Relation Age of Onset   Healthy Mother    Healthy Father      Current Outpatient Medications:    baclofen (LIORESAL) 10 MG tablet, TAKE 1 TABLET BY MOUTH THREE TIMES A DAY, Disp: 270 tablet, Rfl: 1   dexamethasone (DECADRON) 4 MG tablet, Take 1 tablet (4 mg total) by mouth 2 (two) times daily with a meal., Disp: 20 tablet, Rfl: 0   docusate sodium (COLACE) 100 MG capsule, Take 100 mg by mouth daily., Disp: , Rfl:    DULoxetine (CYMBALTA) 30 MG capsule, TAKE 1 CAPSULE BY MOUTH DAILY FOR 7 DAYS, THEN 2 CAPSULES DAILY FOR 23 DAYS., Disp: 159 capsule, Rfl: 1   lidocaine-prilocaine (EMLA) cream, Apply to affected area once, Disp: 30 g, Rfl: 3   metoprolol succinate (TOPROL XL) 25 MG 24 hr tablet, Take 1 tablet (25 mg total) by mouth daily., Disp: 30 tablet, Rfl: 5   OLANZapine (ZYPREXA) 10 MG tablet, TAKE 1 TABLET  BY MOUTH EVERYDAY AT BEDTIME, Disp: 90 tablet, Rfl: 1   oxyCODONE (OXY IR/ROXICODONE) 5 MG immediate release tablet, Take 1 tablet (5 mg total) by mouth every 4 (four) hours as needed for moderate pain or severe pain., Disp: 60 tablet, Rfl: 0   DULoxetine (CYMBALTA) 30 MG capsule, Take by mouth. (Patient not taking: Reported on 06/07/2022), Disp: , Rfl:    fentaNYL (DURAGESIC) 25 MCG/HR, Place 1 patch onto the skin every 3 (three) days., Disp: 5 patch, Rfl: 0   LORazepam (ATIVAN) 0.5 MG tablet, Take 1 tablet (0.5 mg total) by mouth every 6 (six) hours as needed (Nausea or vomiting)., Disp: 30 tablet, Rfl: 0   losartan (COZAAR) 25 MG tablet, Take 1 tablet (25 mg total) by mouth daily., Disp: 30 tablet, Rfl: 3   ondansetron (ZOFRAN) 8 MG tablet, Take 1 tablet (8 mg total) by mouth 2 (two) times daily as needed for refractory nausea / vomiting., Disp: 30 tablet, Rfl: 1   prochlorperazine (COMPAZINE) 10 MG tablet, Take 1 tablet (10 mg total) by mouth every 6 (six) hours as needed (Nausea  or vomiting). (Patient not taking: Reported on 04/15/2022), Disp: 30 tablet, Rfl: 1   temazepam (RESTORIL) 15 MG capsule, Take 1 capsule (15 mg total) by mouth at bedtime as needed for sleep., Disp: 30 capsule, Rfl: 0 No current facility-administered medications for this visit.  Facility-Administered Medications Ordered in Other Visits:    heparin lock flush 100 unit/mL, 500 Units, Intravenous, Once, Sindy Guadeloupe, MD   heparin lock flush 100 unit/mL, 500 Units, Intravenous, Once, Sindy Guadeloupe, MD   sodium chloride flush (NS) 0.9 % injection 10 mL, 10 mL, Intravenous, PRN, Sindy Guadeloupe, MD, 10 mL at 05/01/20 0900   sodium chloride flush (NS) 0.9 % injection 10 mL, 10 mL, Intravenous, PRN, Sindy Guadeloupe, MD, 10 mL at 01/29/21 4401  Physical exam:  Physical Exam Constitutional:      Comments: Patient is sitting in a recliner.  Appears frail and cachectic.  Cardiovascular:     Rate and Rhythm: Normal rate and regular rhythm.     Heart sounds: Normal heart sounds.  Pulmonary:     Effort: Pulmonary effort is normal.     Breath sounds: Normal breath sounds.  Abdominal:     General: Bowel sounds are normal.     Palpations: Abdomen is soft.  Skin:    General: Skin is warm and dry.  Neurological:     Mental Status: He is alert and oriented to person, place, and time.     Comments: No power in bilateral upper extremities         Latest Ref Rng & Units 06/07/2022   10:07 AM  CMP  Glucose 70 - 99 mg/dL 126   BUN 6 - 20 mg/dL 21   Creatinine 0.61 - 1.24 mg/dL 1.09   Sodium 135 - 145 mmol/L 129   Potassium 3.5 - 5.1 mmol/L 3.4   Chloride 98 - 111 mmol/L 92   CO2 22 - 32 mmol/L 28   Calcium 8.9 - 10.3 mg/dL 9.1   Total Protein 6.5 - 8.1 g/dL 7.5   Total Bilirubin 0.3 - 1.2 mg/dL 0.8   Alkaline Phos 38 - 126 U/L 98   AST 15 - 41 U/L 36   ALT 0 - 44 U/L 28       Latest Ref Rng & Units 06/07/2022   10:07 AM  CBC  WBC 4.0 - 10.5 K/uL 7.7  Hemoglobin 13.0 - 17.0 g/dL 9.6    Hematocrit 39.0 - 52.0 % 30.0   Platelets 150 - 400 K/uL 478      Assessment and plan- Patient is a 51 y.o. male with metastatic adenocarcinoma of the lung and progression on multiple lines of chemotherapy here to discuss further management  Patient's main site of disease progression has been in his cervical spine and the disease in that area has been unrelenting.  Despite systemic chemotherapy, surgery and 2 rounds of radiation this mass continues to grow.  He has now developed paralysis of bilateral upper extremities due to growth in the cervical spinal mass.  I discussed this case with Dr. Donella Stade and I feel that with his declining performance status and previous rounds of radiation which have not been able to control the mass, giving another trial of radiation is unlikely to help this patient.    I would recommend proceeding with best supportive care/hospice given the decline in his performance status and explained this to his uncle as well who is present with him.  Patient lives with his girlfriend but his family is able to pitch in in addition to that to provide him support.  Patient is a DNR and is agreeable to proceeding with home hospice   Visit Diagnosis 1. Goals of care, counseling/discussion   2. Malignant neoplasm of upper lobe of left lung ([place])      Dr. Randa Evens, MD, MPH [facility] [phone] 06/12/2022 7:20 PM

## 2022-06-13 ENCOUNTER — Other Ambulatory Visit: Payer: Self-pay | Admitting: *Deleted

## 2022-06-13 DIAGNOSIS — C3490 Malignant neoplasm of unspecified part of unspecified bronchus or lung: Secondary | ICD-10-CM | POA: Diagnosis not present

## 2022-06-13 DIAGNOSIS — I428 Other cardiomyopathies: Secondary | ICD-10-CM | POA: Diagnosis not present

## 2022-06-13 DIAGNOSIS — C7989 Secondary malignant neoplasm of other specified sites: Secondary | ICD-10-CM | POA: Diagnosis not present

## 2022-06-13 DIAGNOSIS — I10 Essential (primary) hypertension: Secondary | ICD-10-CM | POA: Diagnosis not present

## 2022-06-13 DIAGNOSIS — R69 Illness, unspecified: Secondary | ICD-10-CM | POA: Diagnosis not present

## 2022-06-13 DIAGNOSIS — E46 Unspecified protein-calorie malnutrition: Secondary | ICD-10-CM | POA: Diagnosis not present

## 2022-06-13 DIAGNOSIS — C7951 Secondary malignant neoplasm of bone: Secondary | ICD-10-CM | POA: Diagnosis not present

## 2022-06-13 DIAGNOSIS — Z681 Body mass index (BMI) 19 or less, adult: Secondary | ICD-10-CM | POA: Diagnosis not present

## 2022-06-13 DIAGNOSIS — J45909 Unspecified asthma, uncomplicated: Secondary | ICD-10-CM | POA: Diagnosis not present

## 2022-06-13 MED ORDER — TEMAZEPAM 15 MG PO CAPS: 15.0000 mg | ORAL_CAPSULE | Freq: Every evening | ORAL | 0 refills | Status: DC | PRN

## 2022-06-14 ENCOUNTER — Telehealth: Payer: Self-pay | Admitting: *Deleted

## 2022-06-14 DIAGNOSIS — I10 Essential (primary) hypertension: Secondary | ICD-10-CM | POA: Diagnosis not present

## 2022-06-14 DIAGNOSIS — C7951 Secondary malignant neoplasm of bone: Secondary | ICD-10-CM | POA: Diagnosis not present

## 2022-06-14 DIAGNOSIS — I428 Other cardiomyopathies: Secondary | ICD-10-CM | POA: Diagnosis not present

## 2022-06-14 DIAGNOSIS — J45909 Unspecified asthma, uncomplicated: Secondary | ICD-10-CM | POA: Diagnosis not present

## 2022-06-14 DIAGNOSIS — Z681 Body mass index (BMI) 19 or less, adult: Secondary | ICD-10-CM | POA: Diagnosis not present

## 2022-06-14 DIAGNOSIS — C7989 Secondary malignant neoplasm of other specified sites: Secondary | ICD-10-CM | POA: Diagnosis not present

## 2022-06-14 DIAGNOSIS — C3490 Malignant neoplasm of unspecified part of unspecified bronchus or lung: Secondary | ICD-10-CM | POA: Diagnosis not present

## 2022-06-14 DIAGNOSIS — R69 Illness, unspecified: Secondary | ICD-10-CM | POA: Diagnosis not present

## 2022-06-14 DIAGNOSIS — E46 Unspecified protein-calorie malnutrition: Secondary | ICD-10-CM | POA: Diagnosis not present

## 2022-06-14 MED ORDER — TEMAZEPAM 7.5 MG PO CAPS: 7.5000 mg | ORAL_CAPSULE | Freq: Every evening | ORAL | 0 refills | Status: AC | PRN

## 2022-06-14 NOTE — Telephone Encounter (Signed)
Pharmacy reports that patient insurance will only cover Temazepam 7.5 mg per day and we sent prescription for 15 mg per day so they are requesting an alternative medicine for him.

## 2022-06-14 NOTE — Telephone Encounter (Signed)
Switch to temazepam 7.5 mg daily

## 2022-06-15 DIAGNOSIS — C3490 Malignant neoplasm of unspecified part of unspecified bronchus or lung: Secondary | ICD-10-CM | POA: Diagnosis not present

## 2022-06-15 DIAGNOSIS — C7951 Secondary malignant neoplasm of bone: Secondary | ICD-10-CM | POA: Diagnosis not present

## 2022-06-15 DIAGNOSIS — J45909 Unspecified asthma, uncomplicated: Secondary | ICD-10-CM | POA: Diagnosis not present

## 2022-06-15 DIAGNOSIS — R69 Illness, unspecified: Secondary | ICD-10-CM | POA: Diagnosis not present

## 2022-06-15 DIAGNOSIS — C7989 Secondary malignant neoplasm of other specified sites: Secondary | ICD-10-CM | POA: Diagnosis not present

## 2022-06-15 DIAGNOSIS — I428 Other cardiomyopathies: Secondary | ICD-10-CM | POA: Diagnosis not present

## 2022-06-15 DIAGNOSIS — I10 Essential (primary) hypertension: Secondary | ICD-10-CM | POA: Diagnosis not present

## 2022-06-15 DIAGNOSIS — Z681 Body mass index (BMI) 19 or less, adult: Secondary | ICD-10-CM | POA: Diagnosis not present

## 2022-06-15 DIAGNOSIS — E46 Unspecified protein-calorie malnutrition: Secondary | ICD-10-CM | POA: Diagnosis not present

## 2022-06-16 ENCOUNTER — Ambulatory Visit: Payer: 59

## 2022-06-16 DIAGNOSIS — E46 Unspecified protein-calorie malnutrition: Secondary | ICD-10-CM | POA: Diagnosis not present

## 2022-06-16 DIAGNOSIS — I10 Essential (primary) hypertension: Secondary | ICD-10-CM | POA: Diagnosis not present

## 2022-06-16 DIAGNOSIS — C7989 Secondary malignant neoplasm of other specified sites: Secondary | ICD-10-CM | POA: Diagnosis not present

## 2022-06-16 DIAGNOSIS — R69 Illness, unspecified: Secondary | ICD-10-CM | POA: Diagnosis not present

## 2022-06-16 DIAGNOSIS — Z681 Body mass index (BMI) 19 or less, adult: Secondary | ICD-10-CM | POA: Diagnosis not present

## 2022-06-16 DIAGNOSIS — C7951 Secondary malignant neoplasm of bone: Secondary | ICD-10-CM | POA: Diagnosis not present

## 2022-06-16 DIAGNOSIS — C3490 Malignant neoplasm of unspecified part of unspecified bronchus or lung: Secondary | ICD-10-CM | POA: Diagnosis not present

## 2022-06-16 DIAGNOSIS — J45909 Unspecified asthma, uncomplicated: Secondary | ICD-10-CM | POA: Diagnosis not present

## 2022-06-16 DIAGNOSIS — I428 Other cardiomyopathies: Secondary | ICD-10-CM | POA: Diagnosis not present

## 2022-06-17 DIAGNOSIS — I10 Essential (primary) hypertension: Secondary | ICD-10-CM | POA: Diagnosis not present

## 2022-06-17 DIAGNOSIS — Z681 Body mass index (BMI) 19 or less, adult: Secondary | ICD-10-CM | POA: Diagnosis not present

## 2022-06-17 DIAGNOSIS — R69 Illness, unspecified: Secondary | ICD-10-CM | POA: Diagnosis not present

## 2022-06-17 DIAGNOSIS — I428 Other cardiomyopathies: Secondary | ICD-10-CM | POA: Diagnosis not present

## 2022-06-17 DIAGNOSIS — C7989 Secondary malignant neoplasm of other specified sites: Secondary | ICD-10-CM | POA: Diagnosis not present

## 2022-06-17 DIAGNOSIS — J45909 Unspecified asthma, uncomplicated: Secondary | ICD-10-CM | POA: Diagnosis not present

## 2022-06-17 DIAGNOSIS — C7951 Secondary malignant neoplasm of bone: Secondary | ICD-10-CM | POA: Diagnosis not present

## 2022-06-17 DIAGNOSIS — E46 Unspecified protein-calorie malnutrition: Secondary | ICD-10-CM | POA: Diagnosis not present

## 2022-06-17 DIAGNOSIS — C3490 Malignant neoplasm of unspecified part of unspecified bronchus or lung: Secondary | ICD-10-CM | POA: Diagnosis not present

## 2022-06-18 DIAGNOSIS — C3490 Malignant neoplasm of unspecified part of unspecified bronchus or lung: Secondary | ICD-10-CM | POA: Diagnosis not present

## 2022-06-18 DIAGNOSIS — I428 Other cardiomyopathies: Secondary | ICD-10-CM | POA: Diagnosis not present

## 2022-06-18 DIAGNOSIS — C7951 Secondary malignant neoplasm of bone: Secondary | ICD-10-CM | POA: Diagnosis not present

## 2022-06-18 DIAGNOSIS — E46 Unspecified protein-calorie malnutrition: Secondary | ICD-10-CM | POA: Diagnosis not present

## 2022-06-18 DIAGNOSIS — I10 Essential (primary) hypertension: Secondary | ICD-10-CM | POA: Diagnosis not present

## 2022-06-18 DIAGNOSIS — Z681 Body mass index (BMI) 19 or less, adult: Secondary | ICD-10-CM | POA: Diagnosis not present

## 2022-06-18 DIAGNOSIS — R69 Illness, unspecified: Secondary | ICD-10-CM | POA: Diagnosis not present

## 2022-06-18 DIAGNOSIS — J45909 Unspecified asthma, uncomplicated: Secondary | ICD-10-CM | POA: Diagnosis not present

## 2022-06-18 DIAGNOSIS — C7989 Secondary malignant neoplasm of other specified sites: Secondary | ICD-10-CM | POA: Diagnosis not present

## 2022-06-19 DIAGNOSIS — Z681 Body mass index (BMI) 19 or less, adult: Secondary | ICD-10-CM | POA: Diagnosis not present

## 2022-06-19 DIAGNOSIS — C7989 Secondary malignant neoplasm of other specified sites: Secondary | ICD-10-CM | POA: Diagnosis not present

## 2022-06-19 DIAGNOSIS — I428 Other cardiomyopathies: Secondary | ICD-10-CM | POA: Diagnosis not present

## 2022-06-19 DIAGNOSIS — C7951 Secondary malignant neoplasm of bone: Secondary | ICD-10-CM | POA: Diagnosis not present

## 2022-06-19 DIAGNOSIS — E46 Unspecified protein-calorie malnutrition: Secondary | ICD-10-CM | POA: Diagnosis not present

## 2022-06-19 DIAGNOSIS — I10 Essential (primary) hypertension: Secondary | ICD-10-CM | POA: Diagnosis not present

## 2022-06-19 DIAGNOSIS — C3490 Malignant neoplasm of unspecified part of unspecified bronchus or lung: Secondary | ICD-10-CM | POA: Diagnosis not present

## 2022-06-19 DIAGNOSIS — R69 Illness, unspecified: Secondary | ICD-10-CM | POA: Diagnosis not present

## 2022-06-19 DIAGNOSIS — J45909 Unspecified asthma, uncomplicated: Secondary | ICD-10-CM | POA: Diagnosis not present

## 2022-06-20 ENCOUNTER — Ambulatory Visit: Payer: 59 | Admitting: Radiation Oncology

## 2022-06-20 ENCOUNTER — Inpatient Hospital Stay (HOSPITAL_BASED_OUTPATIENT_CLINIC_OR_DEPARTMENT_OTHER): Payer: 59 | Admitting: Hospice and Palliative Medicine

## 2022-06-20 DIAGNOSIS — R69 Illness, unspecified: Secondary | ICD-10-CM | POA: Diagnosis not present

## 2022-06-20 DIAGNOSIS — C3490 Malignant neoplasm of unspecified part of unspecified bronchus or lung: Secondary | ICD-10-CM | POA: Diagnosis not present

## 2022-06-20 DIAGNOSIS — J45909 Unspecified asthma, uncomplicated: Secondary | ICD-10-CM | POA: Diagnosis not present

## 2022-06-20 DIAGNOSIS — C7989 Secondary malignant neoplasm of other specified sites: Secondary | ICD-10-CM | POA: Diagnosis not present

## 2022-06-20 DIAGNOSIS — E46 Unspecified protein-calorie malnutrition: Secondary | ICD-10-CM | POA: Diagnosis not present

## 2022-06-20 DIAGNOSIS — I10 Essential (primary) hypertension: Secondary | ICD-10-CM | POA: Diagnosis not present

## 2022-06-20 DIAGNOSIS — C7951 Secondary malignant neoplasm of bone: Secondary | ICD-10-CM | POA: Diagnosis not present

## 2022-06-20 DIAGNOSIS — I428 Other cardiomyopathies: Secondary | ICD-10-CM | POA: Diagnosis not present

## 2022-06-20 DIAGNOSIS — Z681 Body mass index (BMI) 19 or less, adult: Secondary | ICD-10-CM | POA: Diagnosis not present

## 2022-06-20 DIAGNOSIS — Z515 Encounter for palliative care: Secondary | ICD-10-CM

## 2022-06-20 NOTE — Progress Notes (Signed)
I was unable to reach patient by phone.  Spoke with hospice nurse, Maralyn Sago.  Patient is reportedly doing well symptomatically.  She plans to see patient in the home later this week.

## 2022-06-21 DIAGNOSIS — I428 Other cardiomyopathies: Secondary | ICD-10-CM | POA: Diagnosis not present

## 2022-06-21 DIAGNOSIS — J45909 Unspecified asthma, uncomplicated: Secondary | ICD-10-CM | POA: Diagnosis not present

## 2022-06-21 DIAGNOSIS — I10 Essential (primary) hypertension: Secondary | ICD-10-CM | POA: Diagnosis not present

## 2022-06-21 DIAGNOSIS — C3490 Malignant neoplasm of unspecified part of unspecified bronchus or lung: Secondary | ICD-10-CM | POA: Diagnosis not present

## 2022-06-21 DIAGNOSIS — C7951 Secondary malignant neoplasm of bone: Secondary | ICD-10-CM | POA: Diagnosis not present

## 2022-06-21 DIAGNOSIS — Z681 Body mass index (BMI) 19 or less, adult: Secondary | ICD-10-CM | POA: Diagnosis not present

## 2022-06-21 DIAGNOSIS — R69 Illness, unspecified: Secondary | ICD-10-CM | POA: Diagnosis not present

## 2022-06-21 DIAGNOSIS — E46 Unspecified protein-calorie malnutrition: Secondary | ICD-10-CM | POA: Diagnosis not present

## 2022-06-21 DIAGNOSIS — C7989 Secondary malignant neoplasm of other specified sites: Secondary | ICD-10-CM | POA: Diagnosis not present

## 2022-06-22 ENCOUNTER — Inpatient Hospital Stay: Payer: 59 | Admitting: Oncology

## 2022-06-22 ENCOUNTER — Inpatient Hospital Stay: Payer: 59

## 2022-06-22 DIAGNOSIS — C7951 Secondary malignant neoplasm of bone: Secondary | ICD-10-CM | POA: Diagnosis not present

## 2022-06-22 DIAGNOSIS — C7989 Secondary malignant neoplasm of other specified sites: Secondary | ICD-10-CM | POA: Diagnosis not present

## 2022-06-22 DIAGNOSIS — I10 Essential (primary) hypertension: Secondary | ICD-10-CM | POA: Diagnosis not present

## 2022-06-22 DIAGNOSIS — E46 Unspecified protein-calorie malnutrition: Secondary | ICD-10-CM | POA: Diagnosis not present

## 2022-06-22 DIAGNOSIS — C3490 Malignant neoplasm of unspecified part of unspecified bronchus or lung: Secondary | ICD-10-CM | POA: Diagnosis not present

## 2022-06-22 DIAGNOSIS — I428 Other cardiomyopathies: Secondary | ICD-10-CM | POA: Diagnosis not present

## 2022-06-22 DIAGNOSIS — R69 Illness, unspecified: Secondary | ICD-10-CM | POA: Diagnosis not present

## 2022-06-22 DIAGNOSIS — J45909 Unspecified asthma, uncomplicated: Secondary | ICD-10-CM | POA: Diagnosis not present

## 2022-06-22 DIAGNOSIS — Z681 Body mass index (BMI) 19 or less, adult: Secondary | ICD-10-CM | POA: Diagnosis not present

## 2022-06-23 DIAGNOSIS — E46 Unspecified protein-calorie malnutrition: Secondary | ICD-10-CM | POA: Diagnosis not present

## 2022-06-23 DIAGNOSIS — J45909 Unspecified asthma, uncomplicated: Secondary | ICD-10-CM | POA: Diagnosis not present

## 2022-06-23 DIAGNOSIS — C7951 Secondary malignant neoplasm of bone: Secondary | ICD-10-CM | POA: Diagnosis not present

## 2022-06-23 DIAGNOSIS — C3490 Malignant neoplasm of unspecified part of unspecified bronchus or lung: Secondary | ICD-10-CM | POA: Diagnosis not present

## 2022-06-23 DIAGNOSIS — C7989 Secondary malignant neoplasm of other specified sites: Secondary | ICD-10-CM | POA: Diagnosis not present

## 2022-06-23 DIAGNOSIS — R69 Illness, unspecified: Secondary | ICD-10-CM | POA: Diagnosis not present

## 2022-06-23 DIAGNOSIS — Z681 Body mass index (BMI) 19 or less, adult: Secondary | ICD-10-CM | POA: Diagnosis not present

## 2022-06-23 DIAGNOSIS — I10 Essential (primary) hypertension: Secondary | ICD-10-CM | POA: Diagnosis not present

## 2022-06-23 DIAGNOSIS — I428 Other cardiomyopathies: Secondary | ICD-10-CM | POA: Diagnosis not present

## 2022-06-24 ENCOUNTER — Ambulatory Visit: Payer: 59

## 2022-06-24 DIAGNOSIS — I428 Other cardiomyopathies: Secondary | ICD-10-CM | POA: Diagnosis not present

## 2022-06-24 DIAGNOSIS — J45909 Unspecified asthma, uncomplicated: Secondary | ICD-10-CM | POA: Diagnosis not present

## 2022-06-24 DIAGNOSIS — R69 Illness, unspecified: Secondary | ICD-10-CM | POA: Diagnosis not present

## 2022-06-24 DIAGNOSIS — E46 Unspecified protein-calorie malnutrition: Secondary | ICD-10-CM | POA: Diagnosis not present

## 2022-06-24 DIAGNOSIS — I10 Essential (primary) hypertension: Secondary | ICD-10-CM | POA: Diagnosis not present

## 2022-06-24 DIAGNOSIS — Z681 Body mass index (BMI) 19 or less, adult: Secondary | ICD-10-CM | POA: Diagnosis not present

## 2022-06-24 DIAGNOSIS — C7951 Secondary malignant neoplasm of bone: Secondary | ICD-10-CM | POA: Diagnosis not present

## 2022-06-24 DIAGNOSIS — C3490 Malignant neoplasm of unspecified part of unspecified bronchus or lung: Secondary | ICD-10-CM | POA: Diagnosis not present

## 2022-06-24 DIAGNOSIS — C7989 Secondary malignant neoplasm of other specified sites: Secondary | ICD-10-CM | POA: Diagnosis not present

## 2022-06-25 DIAGNOSIS — E46 Unspecified protein-calorie malnutrition: Secondary | ICD-10-CM | POA: Diagnosis not present

## 2022-06-25 DIAGNOSIS — C3490 Malignant neoplasm of unspecified part of unspecified bronchus or lung: Secondary | ICD-10-CM | POA: Diagnosis not present

## 2022-06-25 DIAGNOSIS — C7951 Secondary malignant neoplasm of bone: Secondary | ICD-10-CM | POA: Diagnosis not present

## 2022-06-25 DIAGNOSIS — J45909 Unspecified asthma, uncomplicated: Secondary | ICD-10-CM | POA: Diagnosis not present

## 2022-06-25 DIAGNOSIS — I428 Other cardiomyopathies: Secondary | ICD-10-CM | POA: Diagnosis not present

## 2022-06-25 DIAGNOSIS — I10 Essential (primary) hypertension: Secondary | ICD-10-CM | POA: Diagnosis not present

## 2022-06-25 DIAGNOSIS — C7989 Secondary malignant neoplasm of other specified sites: Secondary | ICD-10-CM | POA: Diagnosis not present

## 2022-06-25 DIAGNOSIS — Z681 Body mass index (BMI) 19 or less, adult: Secondary | ICD-10-CM | POA: Diagnosis not present

## 2022-06-25 DIAGNOSIS — R69 Illness, unspecified: Secondary | ICD-10-CM | POA: Diagnosis not present

## 2022-06-26 DIAGNOSIS — C7989 Secondary malignant neoplasm of other specified sites: Secondary | ICD-10-CM | POA: Diagnosis not present

## 2022-06-26 DIAGNOSIS — J45909 Unspecified asthma, uncomplicated: Secondary | ICD-10-CM | POA: Diagnosis not present

## 2022-06-26 DIAGNOSIS — C7951 Secondary malignant neoplasm of bone: Secondary | ICD-10-CM | POA: Diagnosis not present

## 2022-06-26 DIAGNOSIS — R69 Illness, unspecified: Secondary | ICD-10-CM | POA: Diagnosis not present

## 2022-06-26 DIAGNOSIS — I428 Other cardiomyopathies: Secondary | ICD-10-CM | POA: Diagnosis not present

## 2022-06-26 DIAGNOSIS — Z681 Body mass index (BMI) 19 or less, adult: Secondary | ICD-10-CM | POA: Diagnosis not present

## 2022-06-26 DIAGNOSIS — I10 Essential (primary) hypertension: Secondary | ICD-10-CM | POA: Diagnosis not present

## 2022-06-26 DIAGNOSIS — C3490 Malignant neoplasm of unspecified part of unspecified bronchus or lung: Secondary | ICD-10-CM | POA: Diagnosis not present

## 2022-06-26 DIAGNOSIS — E46 Unspecified protein-calorie malnutrition: Secondary | ICD-10-CM | POA: Diagnosis not present

## 2022-06-27 DIAGNOSIS — C7989 Secondary malignant neoplasm of other specified sites: Secondary | ICD-10-CM | POA: Diagnosis not present

## 2022-06-27 DIAGNOSIS — I10 Essential (primary) hypertension: Secondary | ICD-10-CM | POA: Diagnosis not present

## 2022-06-27 DIAGNOSIS — R69 Illness, unspecified: Secondary | ICD-10-CM | POA: Diagnosis not present

## 2022-06-27 DIAGNOSIS — C7951 Secondary malignant neoplasm of bone: Secondary | ICD-10-CM | POA: Diagnosis not present

## 2022-06-27 DIAGNOSIS — E46 Unspecified protein-calorie malnutrition: Secondary | ICD-10-CM | POA: Diagnosis not present

## 2022-06-27 DIAGNOSIS — J45909 Unspecified asthma, uncomplicated: Secondary | ICD-10-CM | POA: Diagnosis not present

## 2022-06-27 DIAGNOSIS — C3490 Malignant neoplasm of unspecified part of unspecified bronchus or lung: Secondary | ICD-10-CM | POA: Diagnosis not present

## 2022-06-27 DIAGNOSIS — I428 Other cardiomyopathies: Secondary | ICD-10-CM | POA: Diagnosis not present

## 2022-06-27 DIAGNOSIS — Z681 Body mass index (BMI) 19 or less, adult: Secondary | ICD-10-CM | POA: Diagnosis not present

## 2022-06-28 DIAGNOSIS — J45909 Unspecified asthma, uncomplicated: Secondary | ICD-10-CM | POA: Diagnosis not present

## 2022-06-28 DIAGNOSIS — C3490 Malignant neoplasm of unspecified part of unspecified bronchus or lung: Secondary | ICD-10-CM | POA: Diagnosis not present

## 2022-06-28 DIAGNOSIS — R69 Illness, unspecified: Secondary | ICD-10-CM | POA: Diagnosis not present

## 2022-06-28 DIAGNOSIS — Z681 Body mass index (BMI) 19 or less, adult: Secondary | ICD-10-CM | POA: Diagnosis not present

## 2022-06-28 DIAGNOSIS — C7989 Secondary malignant neoplasm of other specified sites: Secondary | ICD-10-CM | POA: Diagnosis not present

## 2022-06-28 DIAGNOSIS — C7951 Secondary malignant neoplasm of bone: Secondary | ICD-10-CM | POA: Diagnosis not present

## 2022-06-28 DIAGNOSIS — I10 Essential (primary) hypertension: Secondary | ICD-10-CM | POA: Diagnosis not present

## 2022-06-28 DIAGNOSIS — E46 Unspecified protein-calorie malnutrition: Secondary | ICD-10-CM | POA: Diagnosis not present

## 2022-06-28 DIAGNOSIS — I428 Other cardiomyopathies: Secondary | ICD-10-CM | POA: Diagnosis not present

## 2022-06-29 ENCOUNTER — Other Ambulatory Visit: Payer: Self-pay | Admitting: Hospice and Palliative Medicine

## 2022-06-29 DIAGNOSIS — C3490 Malignant neoplasm of unspecified part of unspecified bronchus or lung: Secondary | ICD-10-CM | POA: Diagnosis not present

## 2022-06-29 DIAGNOSIS — I10 Essential (primary) hypertension: Secondary | ICD-10-CM | POA: Diagnosis not present

## 2022-06-29 DIAGNOSIS — E46 Unspecified protein-calorie malnutrition: Secondary | ICD-10-CM | POA: Diagnosis not present

## 2022-06-29 DIAGNOSIS — J45909 Unspecified asthma, uncomplicated: Secondary | ICD-10-CM | POA: Diagnosis not present

## 2022-06-29 DIAGNOSIS — I428 Other cardiomyopathies: Secondary | ICD-10-CM | POA: Diagnosis not present

## 2022-06-29 DIAGNOSIS — R69 Illness, unspecified: Secondary | ICD-10-CM | POA: Diagnosis not present

## 2022-06-29 DIAGNOSIS — Z681 Body mass index (BMI) 19 or less, adult: Secondary | ICD-10-CM | POA: Diagnosis not present

## 2022-06-29 DIAGNOSIS — C7989 Secondary malignant neoplasm of other specified sites: Secondary | ICD-10-CM | POA: Diagnosis not present

## 2022-06-29 DIAGNOSIS — C7951 Secondary malignant neoplasm of bone: Secondary | ICD-10-CM | POA: Diagnosis not present

## 2022-06-29 MED ORDER — MORPHINE SULFATE ER 15 MG PO TBCR: 15.0000 mg | EXTENDED_RELEASE_TABLET | Freq: Two times a day (BID) | ORAL | 0 refills | Status: AC

## 2022-06-29 NOTE — Progress Notes (Signed)
I received a call from hospice nurse, Judson Roch.  Patient has had persistent and poorly controlled pain.  Nurse made a home visit today and patient is unable to give himself oxycodone given the upper extremity paralysis.  He is at home alone during the day.  His girlfriend checks on him in the morning and at night.  He had the fentanyl patch in place but over a bony area.  Given his overall weight loss, it is possible that he is no longer effectively absorbing the fentanyl.  We will rotate to MS Contin to try to provide better, longer acting control.  Nurse will speak with the patient's girlfriend to administer the MS Contin in the morning and at night.

## 2022-06-30 DIAGNOSIS — C7989 Secondary malignant neoplasm of other specified sites: Secondary | ICD-10-CM | POA: Diagnosis not present

## 2022-06-30 DIAGNOSIS — I10 Essential (primary) hypertension: Secondary | ICD-10-CM | POA: Diagnosis not present

## 2022-06-30 DIAGNOSIS — C7951 Secondary malignant neoplasm of bone: Secondary | ICD-10-CM | POA: Diagnosis not present

## 2022-06-30 DIAGNOSIS — C3490 Malignant neoplasm of unspecified part of unspecified bronchus or lung: Secondary | ICD-10-CM | POA: Diagnosis not present

## 2022-06-30 DIAGNOSIS — Z681 Body mass index (BMI) 19 or less, adult: Secondary | ICD-10-CM | POA: Diagnosis not present

## 2022-06-30 DIAGNOSIS — E46 Unspecified protein-calorie malnutrition: Secondary | ICD-10-CM | POA: Diagnosis not present

## 2022-06-30 DIAGNOSIS — R69 Illness, unspecified: Secondary | ICD-10-CM | POA: Diagnosis not present

## 2022-06-30 DIAGNOSIS — J45909 Unspecified asthma, uncomplicated: Secondary | ICD-10-CM | POA: Diagnosis not present

## 2022-06-30 DIAGNOSIS — I428 Other cardiomyopathies: Secondary | ICD-10-CM | POA: Diagnosis not present

## 2022-07-01 DIAGNOSIS — R69 Illness, unspecified: Secondary | ICD-10-CM | POA: Diagnosis not present

## 2022-07-01 DIAGNOSIS — I428 Other cardiomyopathies: Secondary | ICD-10-CM | POA: Diagnosis not present

## 2022-07-01 DIAGNOSIS — C7951 Secondary malignant neoplasm of bone: Secondary | ICD-10-CM | POA: Diagnosis not present

## 2022-07-01 DIAGNOSIS — Z681 Body mass index (BMI) 19 or less, adult: Secondary | ICD-10-CM | POA: Diagnosis not present

## 2022-07-01 DIAGNOSIS — C3490 Malignant neoplasm of unspecified part of unspecified bronchus or lung: Secondary | ICD-10-CM | POA: Diagnosis not present

## 2022-07-01 DIAGNOSIS — C7989 Secondary malignant neoplasm of other specified sites: Secondary | ICD-10-CM | POA: Diagnosis not present

## 2022-07-01 DIAGNOSIS — E46 Unspecified protein-calorie malnutrition: Secondary | ICD-10-CM | POA: Diagnosis not present

## 2022-07-01 DIAGNOSIS — I10 Essential (primary) hypertension: Secondary | ICD-10-CM | POA: Diagnosis not present

## 2022-07-01 DIAGNOSIS — J45909 Unspecified asthma, uncomplicated: Secondary | ICD-10-CM | POA: Diagnosis not present

## 2022-07-02 DIAGNOSIS — C3490 Malignant neoplasm of unspecified part of unspecified bronchus or lung: Secondary | ICD-10-CM | POA: Diagnosis not present

## 2022-07-02 DIAGNOSIS — C7951 Secondary malignant neoplasm of bone: Secondary | ICD-10-CM | POA: Diagnosis not present

## 2022-07-02 DIAGNOSIS — Z681 Body mass index (BMI) 19 or less, adult: Secondary | ICD-10-CM | POA: Diagnosis not present

## 2022-07-02 DIAGNOSIS — I10 Essential (primary) hypertension: Secondary | ICD-10-CM | POA: Diagnosis not present

## 2022-07-02 DIAGNOSIS — J45909 Unspecified asthma, uncomplicated: Secondary | ICD-10-CM | POA: Diagnosis not present

## 2022-07-02 DIAGNOSIS — E46 Unspecified protein-calorie malnutrition: Secondary | ICD-10-CM | POA: Diagnosis not present

## 2022-07-02 DIAGNOSIS — C7989 Secondary malignant neoplasm of other specified sites: Secondary | ICD-10-CM | POA: Diagnosis not present

## 2022-07-02 DIAGNOSIS — R69 Illness, unspecified: Secondary | ICD-10-CM | POA: Diagnosis not present

## 2022-07-02 DIAGNOSIS — I428 Other cardiomyopathies: Secondary | ICD-10-CM | POA: Diagnosis not present

## 2022-07-03 DIAGNOSIS — C7989 Secondary malignant neoplasm of other specified sites: Secondary | ICD-10-CM | POA: Diagnosis not present

## 2022-07-03 DIAGNOSIS — C7951 Secondary malignant neoplasm of bone: Secondary | ICD-10-CM | POA: Diagnosis not present

## 2022-07-03 DIAGNOSIS — C3490 Malignant neoplasm of unspecified part of unspecified bronchus or lung: Secondary | ICD-10-CM | POA: Diagnosis not present

## 2022-07-03 DIAGNOSIS — Z681 Body mass index (BMI) 19 or less, adult: Secondary | ICD-10-CM | POA: Diagnosis not present

## 2022-07-03 DIAGNOSIS — E46 Unspecified protein-calorie malnutrition: Secondary | ICD-10-CM | POA: Diagnosis not present

## 2022-07-03 DIAGNOSIS — I10 Essential (primary) hypertension: Secondary | ICD-10-CM | POA: Diagnosis not present

## 2022-07-03 DIAGNOSIS — I428 Other cardiomyopathies: Secondary | ICD-10-CM | POA: Diagnosis not present

## 2022-07-03 DIAGNOSIS — J45909 Unspecified asthma, uncomplicated: Secondary | ICD-10-CM | POA: Diagnosis not present

## 2022-07-03 DIAGNOSIS — R69 Illness, unspecified: Secondary | ICD-10-CM | POA: Diagnosis not present

## 2022-07-04 DIAGNOSIS — E46 Unspecified protein-calorie malnutrition: Secondary | ICD-10-CM | POA: Diagnosis not present

## 2022-07-04 DIAGNOSIS — I10 Essential (primary) hypertension: Secondary | ICD-10-CM | POA: Diagnosis not present

## 2022-07-04 DIAGNOSIS — C7951 Secondary malignant neoplasm of bone: Secondary | ICD-10-CM | POA: Diagnosis not present

## 2022-07-04 DIAGNOSIS — Z681 Body mass index (BMI) 19 or less, adult: Secondary | ICD-10-CM | POA: Diagnosis not present

## 2022-07-04 DIAGNOSIS — I428 Other cardiomyopathies: Secondary | ICD-10-CM | POA: Diagnosis not present

## 2022-07-04 DIAGNOSIS — R69 Illness, unspecified: Secondary | ICD-10-CM | POA: Diagnosis not present

## 2022-07-04 DIAGNOSIS — C7989 Secondary malignant neoplasm of other specified sites: Secondary | ICD-10-CM | POA: Diagnosis not present

## 2022-07-04 DIAGNOSIS — J45909 Unspecified asthma, uncomplicated: Secondary | ICD-10-CM | POA: Diagnosis not present

## 2022-07-04 DIAGNOSIS — C3490 Malignant neoplasm of unspecified part of unspecified bronchus or lung: Secondary | ICD-10-CM | POA: Diagnosis not present

## 2022-07-05 DIAGNOSIS — J45909 Unspecified asthma, uncomplicated: Secondary | ICD-10-CM | POA: Diagnosis not present

## 2022-07-05 DIAGNOSIS — I428 Other cardiomyopathies: Secondary | ICD-10-CM | POA: Diagnosis not present

## 2022-07-05 DIAGNOSIS — E46 Unspecified protein-calorie malnutrition: Secondary | ICD-10-CM | POA: Diagnosis not present

## 2022-07-05 DIAGNOSIS — R69 Illness, unspecified: Secondary | ICD-10-CM | POA: Diagnosis not present

## 2022-07-05 DIAGNOSIS — Z681 Body mass index (BMI) 19 or less, adult: Secondary | ICD-10-CM | POA: Diagnosis not present

## 2022-07-05 DIAGNOSIS — C3490 Malignant neoplasm of unspecified part of unspecified bronchus or lung: Secondary | ICD-10-CM | POA: Diagnosis not present

## 2022-07-05 DIAGNOSIS — C7989 Secondary malignant neoplasm of other specified sites: Secondary | ICD-10-CM | POA: Diagnosis not present

## 2022-07-05 DIAGNOSIS — C7951 Secondary malignant neoplasm of bone: Secondary | ICD-10-CM | POA: Diagnosis not present

## 2022-07-05 DIAGNOSIS — I10 Essential (primary) hypertension: Secondary | ICD-10-CM | POA: Diagnosis not present

## 2022-07-06 DIAGNOSIS — C7951 Secondary malignant neoplasm of bone: Secondary | ICD-10-CM | POA: Diagnosis not present

## 2022-07-06 DIAGNOSIS — I428 Other cardiomyopathies: Secondary | ICD-10-CM | POA: Diagnosis not present

## 2022-07-06 DIAGNOSIS — C3490 Malignant neoplasm of unspecified part of unspecified bronchus or lung: Secondary | ICD-10-CM | POA: Diagnosis not present

## 2022-07-06 DIAGNOSIS — Z681 Body mass index (BMI) 19 or less, adult: Secondary | ICD-10-CM | POA: Diagnosis not present

## 2022-07-06 DIAGNOSIS — J45909 Unspecified asthma, uncomplicated: Secondary | ICD-10-CM | POA: Diagnosis not present

## 2022-07-06 DIAGNOSIS — E46 Unspecified protein-calorie malnutrition: Secondary | ICD-10-CM | POA: Diagnosis not present

## 2022-07-06 DIAGNOSIS — C7989 Secondary malignant neoplasm of other specified sites: Secondary | ICD-10-CM | POA: Diagnosis not present

## 2022-07-06 DIAGNOSIS — R69 Illness, unspecified: Secondary | ICD-10-CM | POA: Diagnosis not present

## 2022-07-06 DIAGNOSIS — I10 Essential (primary) hypertension: Secondary | ICD-10-CM | POA: Diagnosis not present

## 2022-07-07 DIAGNOSIS — I428 Other cardiomyopathies: Secondary | ICD-10-CM | POA: Diagnosis not present

## 2022-07-07 DIAGNOSIS — C3490 Malignant neoplasm of unspecified part of unspecified bronchus or lung: Secondary | ICD-10-CM | POA: Diagnosis not present

## 2022-07-07 DIAGNOSIS — J45909 Unspecified asthma, uncomplicated: Secondary | ICD-10-CM | POA: Diagnosis not present

## 2022-07-07 DIAGNOSIS — Z681 Body mass index (BMI) 19 or less, adult: Secondary | ICD-10-CM | POA: Diagnosis not present

## 2022-07-07 DIAGNOSIS — C7951 Secondary malignant neoplasm of bone: Secondary | ICD-10-CM | POA: Diagnosis not present

## 2022-07-07 DIAGNOSIS — E46 Unspecified protein-calorie malnutrition: Secondary | ICD-10-CM | POA: Diagnosis not present

## 2022-07-07 DIAGNOSIS — C7989 Secondary malignant neoplasm of other specified sites: Secondary | ICD-10-CM | POA: Diagnosis not present

## 2022-07-07 DIAGNOSIS — R69 Illness, unspecified: Secondary | ICD-10-CM | POA: Diagnosis not present

## 2022-07-07 DIAGNOSIS — I10 Essential (primary) hypertension: Secondary | ICD-10-CM | POA: Diagnosis not present

## 2022-07-08 DIAGNOSIS — R69 Illness, unspecified: Secondary | ICD-10-CM | POA: Diagnosis not present

## 2022-07-08 DIAGNOSIS — C7989 Secondary malignant neoplasm of other specified sites: Secondary | ICD-10-CM | POA: Diagnosis not present

## 2022-07-08 DIAGNOSIS — I10 Essential (primary) hypertension: Secondary | ICD-10-CM | POA: Diagnosis not present

## 2022-07-08 DIAGNOSIS — J45909 Unspecified asthma, uncomplicated: Secondary | ICD-10-CM | POA: Diagnosis not present

## 2022-07-08 DIAGNOSIS — E46 Unspecified protein-calorie malnutrition: Secondary | ICD-10-CM | POA: Diagnosis not present

## 2022-07-08 DIAGNOSIS — Z681 Body mass index (BMI) 19 or less, adult: Secondary | ICD-10-CM | POA: Diagnosis not present

## 2022-07-08 DIAGNOSIS — C7951 Secondary malignant neoplasm of bone: Secondary | ICD-10-CM | POA: Diagnosis not present

## 2022-07-08 DIAGNOSIS — I428 Other cardiomyopathies: Secondary | ICD-10-CM | POA: Diagnosis not present

## 2022-07-08 DIAGNOSIS — C3490 Malignant neoplasm of unspecified part of unspecified bronchus or lung: Secondary | ICD-10-CM | POA: Diagnosis not present

## 2022-07-09 DIAGNOSIS — R69 Illness, unspecified: Secondary | ICD-10-CM | POA: Diagnosis not present

## 2022-07-09 DIAGNOSIS — E46 Unspecified protein-calorie malnutrition: Secondary | ICD-10-CM | POA: Diagnosis not present

## 2022-07-09 DIAGNOSIS — Z681 Body mass index (BMI) 19 or less, adult: Secondary | ICD-10-CM | POA: Diagnosis not present

## 2022-07-09 DIAGNOSIS — C7951 Secondary malignant neoplasm of bone: Secondary | ICD-10-CM | POA: Diagnosis not present

## 2022-07-09 DIAGNOSIS — I428 Other cardiomyopathies: Secondary | ICD-10-CM | POA: Diagnosis not present

## 2022-07-09 DIAGNOSIS — C3490 Malignant neoplasm of unspecified part of unspecified bronchus or lung: Secondary | ICD-10-CM | POA: Diagnosis not present

## 2022-07-09 DIAGNOSIS — C7989 Secondary malignant neoplasm of other specified sites: Secondary | ICD-10-CM | POA: Diagnosis not present

## 2022-07-09 DIAGNOSIS — J45909 Unspecified asthma, uncomplicated: Secondary | ICD-10-CM | POA: Diagnosis not present

## 2022-07-09 DIAGNOSIS — I10 Essential (primary) hypertension: Secondary | ICD-10-CM | POA: Diagnosis not present

## 2022-07-10 DIAGNOSIS — Z681 Body mass index (BMI) 19 or less, adult: Secondary | ICD-10-CM | POA: Diagnosis not present

## 2022-07-10 DIAGNOSIS — R69 Illness, unspecified: Secondary | ICD-10-CM | POA: Diagnosis not present

## 2022-07-10 DIAGNOSIS — C7989 Secondary malignant neoplasm of other specified sites: Secondary | ICD-10-CM | POA: Diagnosis not present

## 2022-07-10 DIAGNOSIS — I428 Other cardiomyopathies: Secondary | ICD-10-CM | POA: Diagnosis not present

## 2022-07-10 DIAGNOSIS — J45909 Unspecified asthma, uncomplicated: Secondary | ICD-10-CM | POA: Diagnosis not present

## 2022-07-10 DIAGNOSIS — C3490 Malignant neoplasm of unspecified part of unspecified bronchus or lung: Secondary | ICD-10-CM | POA: Diagnosis not present

## 2022-07-10 DIAGNOSIS — I10 Essential (primary) hypertension: Secondary | ICD-10-CM | POA: Diagnosis not present

## 2022-07-10 DIAGNOSIS — C7951 Secondary malignant neoplasm of bone: Secondary | ICD-10-CM | POA: Diagnosis not present

## 2022-07-10 DIAGNOSIS — E46 Unspecified protein-calorie malnutrition: Secondary | ICD-10-CM | POA: Diagnosis not present

## 2022-07-11 DIAGNOSIS — C7989 Secondary malignant neoplasm of other specified sites: Secondary | ICD-10-CM | POA: Diagnosis not present

## 2022-07-11 DIAGNOSIS — C3490 Malignant neoplasm of unspecified part of unspecified bronchus or lung: Secondary | ICD-10-CM | POA: Diagnosis not present

## 2022-07-11 DIAGNOSIS — R69 Illness, unspecified: Secondary | ICD-10-CM | POA: Diagnosis not present

## 2022-07-11 DIAGNOSIS — E46 Unspecified protein-calorie malnutrition: Secondary | ICD-10-CM | POA: Diagnosis not present

## 2022-07-11 DIAGNOSIS — Z681 Body mass index (BMI) 19 or less, adult: Secondary | ICD-10-CM | POA: Diagnosis not present

## 2022-07-11 DIAGNOSIS — I428 Other cardiomyopathies: Secondary | ICD-10-CM | POA: Diagnosis not present

## 2022-07-11 DIAGNOSIS — J45909 Unspecified asthma, uncomplicated: Secondary | ICD-10-CM | POA: Diagnosis not present

## 2022-07-11 DIAGNOSIS — I10 Essential (primary) hypertension: Secondary | ICD-10-CM | POA: Diagnosis not present

## 2022-07-11 DIAGNOSIS — C7951 Secondary malignant neoplasm of bone: Secondary | ICD-10-CM | POA: Diagnosis not present

## 2022-07-12 DIAGNOSIS — Z681 Body mass index (BMI) 19 or less, adult: Secondary | ICD-10-CM | POA: Diagnosis not present

## 2022-07-12 DIAGNOSIS — C3490 Malignant neoplasm of unspecified part of unspecified bronchus or lung: Secondary | ICD-10-CM | POA: Diagnosis not present

## 2022-07-12 DIAGNOSIS — I428 Other cardiomyopathies: Secondary | ICD-10-CM | POA: Diagnosis not present

## 2022-07-12 DIAGNOSIS — C7989 Secondary malignant neoplasm of other specified sites: Secondary | ICD-10-CM | POA: Diagnosis not present

## 2022-07-12 DIAGNOSIS — I10 Essential (primary) hypertension: Secondary | ICD-10-CM | POA: Diagnosis not present

## 2022-07-12 DIAGNOSIS — J45909 Unspecified asthma, uncomplicated: Secondary | ICD-10-CM | POA: Diagnosis not present

## 2022-07-12 DIAGNOSIS — E46 Unspecified protein-calorie malnutrition: Secondary | ICD-10-CM | POA: Diagnosis not present

## 2022-07-12 DIAGNOSIS — R69 Illness, unspecified: Secondary | ICD-10-CM | POA: Diagnosis not present

## 2022-07-12 DIAGNOSIS — C7951 Secondary malignant neoplasm of bone: Secondary | ICD-10-CM | POA: Diagnosis not present

## 2022-07-13 DIAGNOSIS — J45909 Unspecified asthma, uncomplicated: Secondary | ICD-10-CM | POA: Diagnosis not present

## 2022-07-13 DIAGNOSIS — I428 Other cardiomyopathies: Secondary | ICD-10-CM | POA: Diagnosis not present

## 2022-07-13 DIAGNOSIS — Z681 Body mass index (BMI) 19 or less, adult: Secondary | ICD-10-CM | POA: Diagnosis not present

## 2022-07-13 DIAGNOSIS — I10 Essential (primary) hypertension: Secondary | ICD-10-CM | POA: Diagnosis not present

## 2022-07-13 DIAGNOSIS — C3490 Malignant neoplasm of unspecified part of unspecified bronchus or lung: Secondary | ICD-10-CM | POA: Diagnosis not present

## 2022-07-13 DIAGNOSIS — C7989 Secondary malignant neoplasm of other specified sites: Secondary | ICD-10-CM | POA: Diagnosis not present

## 2022-07-13 DIAGNOSIS — R69 Illness, unspecified: Secondary | ICD-10-CM | POA: Diagnosis not present

## 2022-07-13 DIAGNOSIS — C7951 Secondary malignant neoplasm of bone: Secondary | ICD-10-CM | POA: Diagnosis not present

## 2022-07-13 DIAGNOSIS — E46 Unspecified protein-calorie malnutrition: Secondary | ICD-10-CM | POA: Diagnosis not present

## 2022-07-14 DIAGNOSIS — R69 Illness, unspecified: Secondary | ICD-10-CM | POA: Diagnosis not present

## 2022-07-14 DIAGNOSIS — J45909 Unspecified asthma, uncomplicated: Secondary | ICD-10-CM | POA: Diagnosis not present

## 2022-07-14 DIAGNOSIS — E46 Unspecified protein-calorie malnutrition: Secondary | ICD-10-CM | POA: Diagnosis not present

## 2022-07-14 DIAGNOSIS — I10 Essential (primary) hypertension: Secondary | ICD-10-CM | POA: Diagnosis not present

## 2022-07-14 DIAGNOSIS — I428 Other cardiomyopathies: Secondary | ICD-10-CM | POA: Diagnosis not present

## 2022-07-14 DIAGNOSIS — Z681 Body mass index (BMI) 19 or less, adult: Secondary | ICD-10-CM | POA: Diagnosis not present

## 2022-07-14 DIAGNOSIS — C3490 Malignant neoplasm of unspecified part of unspecified bronchus or lung: Secondary | ICD-10-CM | POA: Diagnosis not present

## 2022-07-14 DIAGNOSIS — C7951 Secondary malignant neoplasm of bone: Secondary | ICD-10-CM | POA: Diagnosis not present

## 2022-07-14 DIAGNOSIS — C7989 Secondary malignant neoplasm of other specified sites: Secondary | ICD-10-CM | POA: Diagnosis not present

## 2022-07-15 DIAGNOSIS — R69 Illness, unspecified: Secondary | ICD-10-CM | POA: Diagnosis not present

## 2022-07-15 DIAGNOSIS — I428 Other cardiomyopathies: Secondary | ICD-10-CM | POA: Diagnosis not present

## 2022-07-15 DIAGNOSIS — C7951 Secondary malignant neoplasm of bone: Secondary | ICD-10-CM | POA: Diagnosis not present

## 2022-07-15 DIAGNOSIS — I10 Essential (primary) hypertension: Secondary | ICD-10-CM | POA: Diagnosis not present

## 2022-07-15 DIAGNOSIS — E46 Unspecified protein-calorie malnutrition: Secondary | ICD-10-CM | POA: Diagnosis not present

## 2022-07-15 DIAGNOSIS — C7989 Secondary malignant neoplasm of other specified sites: Secondary | ICD-10-CM | POA: Diagnosis not present

## 2022-07-15 DIAGNOSIS — J45909 Unspecified asthma, uncomplicated: Secondary | ICD-10-CM | POA: Diagnosis not present

## 2022-07-15 DIAGNOSIS — Z681 Body mass index (BMI) 19 or less, adult: Secondary | ICD-10-CM | POA: Diagnosis not present

## 2022-07-15 DIAGNOSIS — C3490 Malignant neoplasm of unspecified part of unspecified bronchus or lung: Secondary | ICD-10-CM | POA: Diagnosis not present

## 2022-07-16 DIAGNOSIS — I428 Other cardiomyopathies: Secondary | ICD-10-CM | POA: Diagnosis not present

## 2022-07-16 DIAGNOSIS — C7989 Secondary malignant neoplasm of other specified sites: Secondary | ICD-10-CM | POA: Diagnosis not present

## 2022-07-16 DIAGNOSIS — Z681 Body mass index (BMI) 19 or less, adult: Secondary | ICD-10-CM | POA: Diagnosis not present

## 2022-07-16 DIAGNOSIS — J45909 Unspecified asthma, uncomplicated: Secondary | ICD-10-CM | POA: Diagnosis not present

## 2022-07-16 DIAGNOSIS — I10 Essential (primary) hypertension: Secondary | ICD-10-CM | POA: Diagnosis not present

## 2022-07-16 DIAGNOSIS — C7951 Secondary malignant neoplasm of bone: Secondary | ICD-10-CM | POA: Diagnosis not present

## 2022-07-16 DIAGNOSIS — C3490 Malignant neoplasm of unspecified part of unspecified bronchus or lung: Secondary | ICD-10-CM | POA: Diagnosis not present

## 2022-07-16 DIAGNOSIS — R69 Illness, unspecified: Secondary | ICD-10-CM | POA: Diagnosis not present

## 2022-07-16 DIAGNOSIS — E46 Unspecified protein-calorie malnutrition: Secondary | ICD-10-CM | POA: Diagnosis not present

## 2022-07-17 DIAGNOSIS — I428 Other cardiomyopathies: Secondary | ICD-10-CM | POA: Diagnosis not present

## 2022-07-17 DIAGNOSIS — J45909 Unspecified asthma, uncomplicated: Secondary | ICD-10-CM | POA: Diagnosis not present

## 2022-07-17 DIAGNOSIS — E46 Unspecified protein-calorie malnutrition: Secondary | ICD-10-CM | POA: Diagnosis not present

## 2022-07-17 DIAGNOSIS — C3490 Malignant neoplasm of unspecified part of unspecified bronchus or lung: Secondary | ICD-10-CM | POA: Diagnosis not present

## 2022-07-17 DIAGNOSIS — Z681 Body mass index (BMI) 19 or less, adult: Secondary | ICD-10-CM | POA: Diagnosis not present

## 2022-07-17 DIAGNOSIS — R69 Illness, unspecified: Secondary | ICD-10-CM | POA: Diagnosis not present

## 2022-07-17 DIAGNOSIS — C7989 Secondary malignant neoplasm of other specified sites: Secondary | ICD-10-CM | POA: Diagnosis not present

## 2022-07-17 DIAGNOSIS — C7951 Secondary malignant neoplasm of bone: Secondary | ICD-10-CM | POA: Diagnosis not present

## 2022-07-17 DIAGNOSIS — I10 Essential (primary) hypertension: Secondary | ICD-10-CM | POA: Diagnosis not present

## 2022-07-18 DIAGNOSIS — Z681 Body mass index (BMI) 19 or less, adult: Secondary | ICD-10-CM | POA: Diagnosis not present

## 2022-07-18 DIAGNOSIS — E46 Unspecified protein-calorie malnutrition: Secondary | ICD-10-CM | POA: Diagnosis not present

## 2022-07-18 DIAGNOSIS — J45909 Unspecified asthma, uncomplicated: Secondary | ICD-10-CM | POA: Diagnosis not present

## 2022-07-18 DIAGNOSIS — C7951 Secondary malignant neoplasm of bone: Secondary | ICD-10-CM | POA: Diagnosis not present

## 2022-07-18 DIAGNOSIS — I428 Other cardiomyopathies: Secondary | ICD-10-CM | POA: Diagnosis not present

## 2022-07-18 DIAGNOSIS — R69 Illness, unspecified: Secondary | ICD-10-CM | POA: Diagnosis not present

## 2022-07-18 DIAGNOSIS — C3490 Malignant neoplasm of unspecified part of unspecified bronchus or lung: Secondary | ICD-10-CM | POA: Diagnosis not present

## 2022-07-18 DIAGNOSIS — I10 Essential (primary) hypertension: Secondary | ICD-10-CM | POA: Diagnosis not present

## 2022-07-18 DIAGNOSIS — C7989 Secondary malignant neoplasm of other specified sites: Secondary | ICD-10-CM | POA: Diagnosis not present

## 2022-07-19 DIAGNOSIS — C7951 Secondary malignant neoplasm of bone: Secondary | ICD-10-CM | POA: Diagnosis not present

## 2022-07-19 DIAGNOSIS — E46 Unspecified protein-calorie malnutrition: Secondary | ICD-10-CM | POA: Diagnosis not present

## 2022-07-19 DIAGNOSIS — J45909 Unspecified asthma, uncomplicated: Secondary | ICD-10-CM | POA: Diagnosis not present

## 2022-07-19 DIAGNOSIS — R69 Illness, unspecified: Secondary | ICD-10-CM | POA: Diagnosis not present

## 2022-07-19 DIAGNOSIS — C3490 Malignant neoplasm of unspecified part of unspecified bronchus or lung: Secondary | ICD-10-CM | POA: Diagnosis not present

## 2022-07-19 DIAGNOSIS — I10 Essential (primary) hypertension: Secondary | ICD-10-CM | POA: Diagnosis not present

## 2022-07-19 DIAGNOSIS — Z681 Body mass index (BMI) 19 or less, adult: Secondary | ICD-10-CM | POA: Diagnosis not present

## 2022-07-19 DIAGNOSIS — I428 Other cardiomyopathies: Secondary | ICD-10-CM | POA: Diagnosis not present

## 2022-07-19 DIAGNOSIS — C7989 Secondary malignant neoplasm of other specified sites: Secondary | ICD-10-CM | POA: Diagnosis not present

## 2022-07-20 DIAGNOSIS — J45909 Unspecified asthma, uncomplicated: Secondary | ICD-10-CM | POA: Diagnosis not present

## 2022-07-20 DIAGNOSIS — C7951 Secondary malignant neoplasm of bone: Secondary | ICD-10-CM | POA: Diagnosis not present

## 2022-07-20 DIAGNOSIS — C3490 Malignant neoplasm of unspecified part of unspecified bronchus or lung: Secondary | ICD-10-CM | POA: Diagnosis not present

## 2022-07-20 DIAGNOSIS — I10 Essential (primary) hypertension: Secondary | ICD-10-CM | POA: Diagnosis not present

## 2022-07-20 DIAGNOSIS — I428 Other cardiomyopathies: Secondary | ICD-10-CM | POA: Diagnosis not present

## 2022-07-20 DIAGNOSIS — Z681 Body mass index (BMI) 19 or less, adult: Secondary | ICD-10-CM | POA: Diagnosis not present

## 2022-07-20 DIAGNOSIS — C7989 Secondary malignant neoplasm of other specified sites: Secondary | ICD-10-CM | POA: Diagnosis not present

## 2022-07-20 DIAGNOSIS — R69 Illness, unspecified: Secondary | ICD-10-CM | POA: Diagnosis not present

## 2022-07-20 DIAGNOSIS — E46 Unspecified protein-calorie malnutrition: Secondary | ICD-10-CM | POA: Diagnosis not present

## 2022-07-21 ENCOUNTER — Other Ambulatory Visit: Payer: Self-pay | Admitting: Oncology

## 2022-07-21 DIAGNOSIS — I428 Other cardiomyopathies: Secondary | ICD-10-CM | POA: Diagnosis not present

## 2022-07-21 DIAGNOSIS — C3490 Malignant neoplasm of unspecified part of unspecified bronchus or lung: Secondary | ICD-10-CM | POA: Diagnosis not present

## 2022-07-21 DIAGNOSIS — C7951 Secondary malignant neoplasm of bone: Secondary | ICD-10-CM | POA: Diagnosis not present

## 2022-07-21 DIAGNOSIS — Z681 Body mass index (BMI) 19 or less, adult: Secondary | ICD-10-CM | POA: Diagnosis not present

## 2022-07-21 DIAGNOSIS — I10 Essential (primary) hypertension: Secondary | ICD-10-CM | POA: Diagnosis not present

## 2022-07-21 DIAGNOSIS — E46 Unspecified protein-calorie malnutrition: Secondary | ICD-10-CM | POA: Diagnosis not present

## 2022-07-21 DIAGNOSIS — C7989 Secondary malignant neoplasm of other specified sites: Secondary | ICD-10-CM | POA: Diagnosis not present

## 2022-07-21 DIAGNOSIS — J45909 Unspecified asthma, uncomplicated: Secondary | ICD-10-CM | POA: Diagnosis not present

## 2022-07-21 DIAGNOSIS — R69 Illness, unspecified: Secondary | ICD-10-CM | POA: Diagnosis not present

## 2022-07-22 DIAGNOSIS — J45909 Unspecified asthma, uncomplicated: Secondary | ICD-10-CM | POA: Diagnosis not present

## 2022-07-22 DIAGNOSIS — R69 Illness, unspecified: Secondary | ICD-10-CM | POA: Diagnosis not present

## 2022-07-22 DIAGNOSIS — I428 Other cardiomyopathies: Secondary | ICD-10-CM | POA: Diagnosis not present

## 2022-07-22 DIAGNOSIS — I10 Essential (primary) hypertension: Secondary | ICD-10-CM | POA: Diagnosis not present

## 2022-07-22 DIAGNOSIS — E46 Unspecified protein-calorie malnutrition: Secondary | ICD-10-CM | POA: Diagnosis not present

## 2022-07-22 DIAGNOSIS — Z681 Body mass index (BMI) 19 or less, adult: Secondary | ICD-10-CM | POA: Diagnosis not present

## 2022-07-22 DIAGNOSIS — C7951 Secondary malignant neoplasm of bone: Secondary | ICD-10-CM | POA: Diagnosis not present

## 2022-07-22 DIAGNOSIS — C3490 Malignant neoplasm of unspecified part of unspecified bronchus or lung: Secondary | ICD-10-CM | POA: Diagnosis not present

## 2022-07-22 DIAGNOSIS — C7989 Secondary malignant neoplasm of other specified sites: Secondary | ICD-10-CM | POA: Diagnosis not present

## 2022-07-23 DIAGNOSIS — C7951 Secondary malignant neoplasm of bone: Secondary | ICD-10-CM | POA: Diagnosis not present

## 2022-07-23 DIAGNOSIS — C7989 Secondary malignant neoplasm of other specified sites: Secondary | ICD-10-CM | POA: Diagnosis not present

## 2022-07-23 DIAGNOSIS — J45909 Unspecified asthma, uncomplicated: Secondary | ICD-10-CM | POA: Diagnosis not present

## 2022-07-23 DIAGNOSIS — R69 Illness, unspecified: Secondary | ICD-10-CM | POA: Diagnosis not present

## 2022-07-23 DIAGNOSIS — E46 Unspecified protein-calorie malnutrition: Secondary | ICD-10-CM | POA: Diagnosis not present

## 2022-07-23 DIAGNOSIS — I10 Essential (primary) hypertension: Secondary | ICD-10-CM | POA: Diagnosis not present

## 2022-07-23 DIAGNOSIS — Z681 Body mass index (BMI) 19 or less, adult: Secondary | ICD-10-CM | POA: Diagnosis not present

## 2022-07-23 DIAGNOSIS — I428 Other cardiomyopathies: Secondary | ICD-10-CM | POA: Diagnosis not present

## 2022-07-23 DIAGNOSIS — C3490 Malignant neoplasm of unspecified part of unspecified bronchus or lung: Secondary | ICD-10-CM | POA: Diagnosis not present

## 2022-07-24 DIAGNOSIS — E46 Unspecified protein-calorie malnutrition: Secondary | ICD-10-CM | POA: Diagnosis not present

## 2022-07-24 DIAGNOSIS — C7951 Secondary malignant neoplasm of bone: Secondary | ICD-10-CM | POA: Diagnosis not present

## 2022-07-24 DIAGNOSIS — I428 Other cardiomyopathies: Secondary | ICD-10-CM | POA: Diagnosis not present

## 2022-07-24 DIAGNOSIS — C7989 Secondary malignant neoplasm of other specified sites: Secondary | ICD-10-CM | POA: Diagnosis not present

## 2022-07-24 DIAGNOSIS — C3490 Malignant neoplasm of unspecified part of unspecified bronchus or lung: Secondary | ICD-10-CM | POA: Diagnosis not present

## 2022-07-24 DIAGNOSIS — I10 Essential (primary) hypertension: Secondary | ICD-10-CM | POA: Diagnosis not present

## 2022-07-24 DIAGNOSIS — J45909 Unspecified asthma, uncomplicated: Secondary | ICD-10-CM | POA: Diagnosis not present

## 2022-07-24 DIAGNOSIS — Z681 Body mass index (BMI) 19 or less, adult: Secondary | ICD-10-CM | POA: Diagnosis not present

## 2022-07-24 DIAGNOSIS — R69 Illness, unspecified: Secondary | ICD-10-CM | POA: Diagnosis not present

## 2022-07-25 DIAGNOSIS — C7951 Secondary malignant neoplasm of bone: Secondary | ICD-10-CM | POA: Diagnosis not present

## 2022-07-25 DIAGNOSIS — I428 Other cardiomyopathies: Secondary | ICD-10-CM | POA: Diagnosis not present

## 2022-07-25 DIAGNOSIS — J45909 Unspecified asthma, uncomplicated: Secondary | ICD-10-CM | POA: Diagnosis not present

## 2022-07-25 DIAGNOSIS — R69 Illness, unspecified: Secondary | ICD-10-CM | POA: Diagnosis not present

## 2022-07-25 DIAGNOSIS — C3490 Malignant neoplasm of unspecified part of unspecified bronchus or lung: Secondary | ICD-10-CM | POA: Diagnosis not present

## 2022-07-25 DIAGNOSIS — C7989 Secondary malignant neoplasm of other specified sites: Secondary | ICD-10-CM | POA: Diagnosis not present

## 2022-07-25 DIAGNOSIS — I10 Essential (primary) hypertension: Secondary | ICD-10-CM | POA: Diagnosis not present

## 2022-07-25 DIAGNOSIS — E46 Unspecified protein-calorie malnutrition: Secondary | ICD-10-CM | POA: Diagnosis not present

## 2022-07-25 DIAGNOSIS — Z681 Body mass index (BMI) 19 or less, adult: Secondary | ICD-10-CM | POA: Diagnosis not present

## 2022-07-26 DIAGNOSIS — Z681 Body mass index (BMI) 19 or less, adult: Secondary | ICD-10-CM | POA: Diagnosis not present

## 2022-07-26 DIAGNOSIS — C7951 Secondary malignant neoplasm of bone: Secondary | ICD-10-CM | POA: Diagnosis not present

## 2022-07-26 DIAGNOSIS — I428 Other cardiomyopathies: Secondary | ICD-10-CM | POA: Diagnosis not present

## 2022-07-26 DIAGNOSIS — C3490 Malignant neoplasm of unspecified part of unspecified bronchus or lung: Secondary | ICD-10-CM | POA: Diagnosis not present

## 2022-07-26 DIAGNOSIS — I10 Essential (primary) hypertension: Secondary | ICD-10-CM | POA: Diagnosis not present

## 2022-07-26 DIAGNOSIS — E46 Unspecified protein-calorie malnutrition: Secondary | ICD-10-CM | POA: Diagnosis not present

## 2022-07-26 DIAGNOSIS — J45909 Unspecified asthma, uncomplicated: Secondary | ICD-10-CM | POA: Diagnosis not present

## 2022-07-26 DIAGNOSIS — C7989 Secondary malignant neoplasm of other specified sites: Secondary | ICD-10-CM | POA: Diagnosis not present

## 2022-07-26 DIAGNOSIS — R69 Illness, unspecified: Secondary | ICD-10-CM | POA: Diagnosis not present

## 2022-07-27 DIAGNOSIS — I10 Essential (primary) hypertension: Secondary | ICD-10-CM | POA: Diagnosis not present

## 2022-07-27 DIAGNOSIS — C7951 Secondary malignant neoplasm of bone: Secondary | ICD-10-CM | POA: Diagnosis not present

## 2022-07-27 DIAGNOSIS — C3490 Malignant neoplasm of unspecified part of unspecified bronchus or lung: Secondary | ICD-10-CM | POA: Diagnosis not present

## 2022-07-27 DIAGNOSIS — E46 Unspecified protein-calorie malnutrition: Secondary | ICD-10-CM | POA: Diagnosis not present

## 2022-07-27 DIAGNOSIS — R69 Illness, unspecified: Secondary | ICD-10-CM | POA: Diagnosis not present

## 2022-07-27 DIAGNOSIS — C7989 Secondary malignant neoplasm of other specified sites: Secondary | ICD-10-CM | POA: Diagnosis not present

## 2022-07-27 DIAGNOSIS — J45909 Unspecified asthma, uncomplicated: Secondary | ICD-10-CM | POA: Diagnosis not present

## 2022-07-27 DIAGNOSIS — Z681 Body mass index (BMI) 19 or less, adult: Secondary | ICD-10-CM | POA: Diagnosis not present

## 2022-07-27 DIAGNOSIS — I428 Other cardiomyopathies: Secondary | ICD-10-CM | POA: Diagnosis not present

## 2022-07-28 DIAGNOSIS — Z681 Body mass index (BMI) 19 or less, adult: Secondary | ICD-10-CM | POA: Diagnosis not present

## 2022-07-28 DIAGNOSIS — J45909 Unspecified asthma, uncomplicated: Secondary | ICD-10-CM | POA: Diagnosis not present

## 2022-07-28 DIAGNOSIS — C7989 Secondary malignant neoplasm of other specified sites: Secondary | ICD-10-CM | POA: Diagnosis not present

## 2022-07-28 DIAGNOSIS — I10 Essential (primary) hypertension: Secondary | ICD-10-CM | POA: Diagnosis not present

## 2022-07-28 DIAGNOSIS — I428 Other cardiomyopathies: Secondary | ICD-10-CM | POA: Diagnosis not present

## 2022-07-28 DIAGNOSIS — C7951 Secondary malignant neoplasm of bone: Secondary | ICD-10-CM | POA: Diagnosis not present

## 2022-07-28 DIAGNOSIS — E46 Unspecified protein-calorie malnutrition: Secondary | ICD-10-CM | POA: Diagnosis not present

## 2022-07-28 DIAGNOSIS — R69 Illness, unspecified: Secondary | ICD-10-CM | POA: Diagnosis not present

## 2022-07-28 DIAGNOSIS — C3490 Malignant neoplasm of unspecified part of unspecified bronchus or lung: Secondary | ICD-10-CM | POA: Diagnosis not present

## 2022-07-29 DIAGNOSIS — J45909 Unspecified asthma, uncomplicated: Secondary | ICD-10-CM | POA: Diagnosis not present

## 2022-07-29 DIAGNOSIS — R69 Illness, unspecified: Secondary | ICD-10-CM | POA: Diagnosis not present

## 2022-07-29 DIAGNOSIS — C7951 Secondary malignant neoplasm of bone: Secondary | ICD-10-CM | POA: Diagnosis not present

## 2022-07-29 DIAGNOSIS — I10 Essential (primary) hypertension: Secondary | ICD-10-CM | POA: Diagnosis not present

## 2022-07-29 DIAGNOSIS — C3490 Malignant neoplasm of unspecified part of unspecified bronchus or lung: Secondary | ICD-10-CM | POA: Diagnosis not present

## 2022-07-29 DIAGNOSIS — I428 Other cardiomyopathies: Secondary | ICD-10-CM | POA: Diagnosis not present

## 2022-07-29 DIAGNOSIS — Z681 Body mass index (BMI) 19 or less, adult: Secondary | ICD-10-CM | POA: Diagnosis not present

## 2022-07-29 DIAGNOSIS — C7989 Secondary malignant neoplasm of other specified sites: Secondary | ICD-10-CM | POA: Diagnosis not present

## 2022-07-29 DIAGNOSIS — E46 Unspecified protein-calorie malnutrition: Secondary | ICD-10-CM | POA: Diagnosis not present

## 2022-07-30 DIAGNOSIS — I10 Essential (primary) hypertension: Secondary | ICD-10-CM | POA: Diagnosis not present

## 2022-07-30 DIAGNOSIS — I428 Other cardiomyopathies: Secondary | ICD-10-CM | POA: Diagnosis not present

## 2022-07-30 DIAGNOSIS — C7951 Secondary malignant neoplasm of bone: Secondary | ICD-10-CM | POA: Diagnosis not present

## 2022-07-30 DIAGNOSIS — C3490 Malignant neoplasm of unspecified part of unspecified bronchus or lung: Secondary | ICD-10-CM | POA: Diagnosis not present

## 2022-07-30 DIAGNOSIS — E46 Unspecified protein-calorie malnutrition: Secondary | ICD-10-CM | POA: Diagnosis not present

## 2022-07-30 DIAGNOSIS — J45909 Unspecified asthma, uncomplicated: Secondary | ICD-10-CM | POA: Diagnosis not present

## 2022-07-30 DIAGNOSIS — R69 Illness, unspecified: Secondary | ICD-10-CM | POA: Diagnosis not present

## 2022-07-30 DIAGNOSIS — C7989 Secondary malignant neoplasm of other specified sites: Secondary | ICD-10-CM | POA: Diagnosis not present

## 2022-07-30 DIAGNOSIS — Z681 Body mass index (BMI) 19 or less, adult: Secondary | ICD-10-CM | POA: Diagnosis not present

## 2022-07-31 DIAGNOSIS — Z681 Body mass index (BMI) 19 or less, adult: Secondary | ICD-10-CM | POA: Diagnosis not present

## 2022-07-31 DIAGNOSIS — C3490 Malignant neoplasm of unspecified part of unspecified bronchus or lung: Secondary | ICD-10-CM | POA: Diagnosis not present

## 2022-07-31 DIAGNOSIS — E46 Unspecified protein-calorie malnutrition: Secondary | ICD-10-CM | POA: Diagnosis not present

## 2022-07-31 DIAGNOSIS — C7951 Secondary malignant neoplasm of bone: Secondary | ICD-10-CM | POA: Diagnosis not present

## 2022-07-31 DIAGNOSIS — J45909 Unspecified asthma, uncomplicated: Secondary | ICD-10-CM | POA: Diagnosis not present

## 2022-07-31 DIAGNOSIS — I10 Essential (primary) hypertension: Secondary | ICD-10-CM | POA: Diagnosis not present

## 2022-07-31 DIAGNOSIS — C7989 Secondary malignant neoplasm of other specified sites: Secondary | ICD-10-CM | POA: Diagnosis not present

## 2022-07-31 DIAGNOSIS — I428 Other cardiomyopathies: Secondary | ICD-10-CM | POA: Diagnosis not present

## 2022-07-31 DIAGNOSIS — R69 Illness, unspecified: Secondary | ICD-10-CM | POA: Diagnosis not present

## 2022-08-01 DIAGNOSIS — I428 Other cardiomyopathies: Secondary | ICD-10-CM | POA: Diagnosis not present

## 2022-08-01 DIAGNOSIS — I10 Essential (primary) hypertension: Secondary | ICD-10-CM | POA: Diagnosis not present

## 2022-08-01 DIAGNOSIS — C7989 Secondary malignant neoplasm of other specified sites: Secondary | ICD-10-CM | POA: Diagnosis not present

## 2022-08-01 DIAGNOSIS — C3490 Malignant neoplasm of unspecified part of unspecified bronchus or lung: Secondary | ICD-10-CM | POA: Diagnosis not present

## 2022-08-01 DIAGNOSIS — Z681 Body mass index (BMI) 19 or less, adult: Secondary | ICD-10-CM | POA: Diagnosis not present

## 2022-08-01 DIAGNOSIS — J45909 Unspecified asthma, uncomplicated: Secondary | ICD-10-CM | POA: Diagnosis not present

## 2022-08-01 DIAGNOSIS — R69 Illness, unspecified: Secondary | ICD-10-CM | POA: Diagnosis not present

## 2022-08-01 DIAGNOSIS — C7951 Secondary malignant neoplasm of bone: Secondary | ICD-10-CM | POA: Diagnosis not present

## 2022-08-01 DIAGNOSIS — E46 Unspecified protein-calorie malnutrition: Secondary | ICD-10-CM | POA: Diagnosis not present

## 2022-08-02 DIAGNOSIS — Z681 Body mass index (BMI) 19 or less, adult: Secondary | ICD-10-CM | POA: Diagnosis not present

## 2022-08-02 DIAGNOSIS — I428 Other cardiomyopathies: Secondary | ICD-10-CM | POA: Diagnosis not present

## 2022-08-02 DIAGNOSIS — C7951 Secondary malignant neoplasm of bone: Secondary | ICD-10-CM | POA: Diagnosis not present

## 2022-08-02 DIAGNOSIS — I10 Essential (primary) hypertension: Secondary | ICD-10-CM | POA: Diagnosis not present

## 2022-08-02 DIAGNOSIS — R69 Illness, unspecified: Secondary | ICD-10-CM | POA: Diagnosis not present

## 2022-08-02 DIAGNOSIS — J45909 Unspecified asthma, uncomplicated: Secondary | ICD-10-CM | POA: Diagnosis not present

## 2022-08-02 DIAGNOSIS — C3490 Malignant neoplasm of unspecified part of unspecified bronchus or lung: Secondary | ICD-10-CM | POA: Diagnosis not present

## 2022-08-02 DIAGNOSIS — E46 Unspecified protein-calorie malnutrition: Secondary | ICD-10-CM | POA: Diagnosis not present

## 2022-08-02 DIAGNOSIS — C7989 Secondary malignant neoplasm of other specified sites: Secondary | ICD-10-CM | POA: Diagnosis not present

## 2022-08-03 DIAGNOSIS — R69 Illness, unspecified: Secondary | ICD-10-CM | POA: Diagnosis not present

## 2022-08-03 DIAGNOSIS — J45909 Unspecified asthma, uncomplicated: Secondary | ICD-10-CM | POA: Diagnosis not present

## 2022-08-03 DIAGNOSIS — Z681 Body mass index (BMI) 19 or less, adult: Secondary | ICD-10-CM | POA: Diagnosis not present

## 2022-08-03 DIAGNOSIS — C7951 Secondary malignant neoplasm of bone: Secondary | ICD-10-CM | POA: Diagnosis not present

## 2022-08-03 DIAGNOSIS — C3490 Malignant neoplasm of unspecified part of unspecified bronchus or lung: Secondary | ICD-10-CM | POA: Diagnosis not present

## 2022-08-03 DIAGNOSIS — I10 Essential (primary) hypertension: Secondary | ICD-10-CM | POA: Diagnosis not present

## 2022-08-03 DIAGNOSIS — E46 Unspecified protein-calorie malnutrition: Secondary | ICD-10-CM | POA: Diagnosis not present

## 2022-08-03 DIAGNOSIS — C7989 Secondary malignant neoplasm of other specified sites: Secondary | ICD-10-CM | POA: Diagnosis not present

## 2022-08-03 DIAGNOSIS — I428 Other cardiomyopathies: Secondary | ICD-10-CM | POA: Diagnosis not present

## 2022-08-04 DIAGNOSIS — C3490 Malignant neoplasm of unspecified part of unspecified bronchus or lung: Secondary | ICD-10-CM | POA: Diagnosis not present

## 2022-08-04 DIAGNOSIS — C7989 Secondary malignant neoplasm of other specified sites: Secondary | ICD-10-CM | POA: Diagnosis not present

## 2022-08-04 DIAGNOSIS — I10 Essential (primary) hypertension: Secondary | ICD-10-CM | POA: Diagnosis not present

## 2022-08-04 DIAGNOSIS — J45909 Unspecified asthma, uncomplicated: Secondary | ICD-10-CM | POA: Diagnosis not present

## 2022-08-04 DIAGNOSIS — Z681 Body mass index (BMI) 19 or less, adult: Secondary | ICD-10-CM | POA: Diagnosis not present

## 2022-08-04 DIAGNOSIS — E46 Unspecified protein-calorie malnutrition: Secondary | ICD-10-CM | POA: Diagnosis not present

## 2022-08-04 DIAGNOSIS — R69 Illness, unspecified: Secondary | ICD-10-CM | POA: Diagnosis not present

## 2022-08-04 DIAGNOSIS — C7951 Secondary malignant neoplasm of bone: Secondary | ICD-10-CM | POA: Diagnosis not present

## 2022-08-04 DIAGNOSIS — I428 Other cardiomyopathies: Secondary | ICD-10-CM | POA: Diagnosis not present

## 2022-08-05 DIAGNOSIS — E46 Unspecified protein-calorie malnutrition: Secondary | ICD-10-CM | POA: Diagnosis not present

## 2022-08-05 DIAGNOSIS — I10 Essential (primary) hypertension: Secondary | ICD-10-CM | POA: Diagnosis not present

## 2022-08-05 DIAGNOSIS — C7951 Secondary malignant neoplasm of bone: Secondary | ICD-10-CM | POA: Diagnosis not present

## 2022-08-05 DIAGNOSIS — R69 Illness, unspecified: Secondary | ICD-10-CM | POA: Diagnosis not present

## 2022-08-05 DIAGNOSIS — C3490 Malignant neoplasm of unspecified part of unspecified bronchus or lung: Secondary | ICD-10-CM | POA: Diagnosis not present

## 2022-08-05 DIAGNOSIS — J45909 Unspecified asthma, uncomplicated: Secondary | ICD-10-CM | POA: Diagnosis not present

## 2022-08-05 DIAGNOSIS — Z681 Body mass index (BMI) 19 or less, adult: Secondary | ICD-10-CM | POA: Diagnosis not present

## 2022-08-05 DIAGNOSIS — C7989 Secondary malignant neoplasm of other specified sites: Secondary | ICD-10-CM | POA: Diagnosis not present

## 2022-08-05 DIAGNOSIS — I428 Other cardiomyopathies: Secondary | ICD-10-CM | POA: Diagnosis not present

## 2022-08-06 DIAGNOSIS — Z681 Body mass index (BMI) 19 or less, adult: Secondary | ICD-10-CM | POA: Diagnosis not present

## 2022-08-06 DIAGNOSIS — I428 Other cardiomyopathies: Secondary | ICD-10-CM | POA: Diagnosis not present

## 2022-08-06 DIAGNOSIS — E46 Unspecified protein-calorie malnutrition: Secondary | ICD-10-CM | POA: Diagnosis not present

## 2022-08-06 DIAGNOSIS — J45909 Unspecified asthma, uncomplicated: Secondary | ICD-10-CM | POA: Diagnosis not present

## 2022-08-06 DIAGNOSIS — C3490 Malignant neoplasm of unspecified part of unspecified bronchus or lung: Secondary | ICD-10-CM | POA: Diagnosis not present

## 2022-08-06 DIAGNOSIS — I10 Essential (primary) hypertension: Secondary | ICD-10-CM | POA: Diagnosis not present

## 2022-08-06 DIAGNOSIS — R69 Illness, unspecified: Secondary | ICD-10-CM | POA: Diagnosis not present

## 2022-08-06 DIAGNOSIS — C7989 Secondary malignant neoplasm of other specified sites: Secondary | ICD-10-CM | POA: Diagnosis not present

## 2022-08-06 DIAGNOSIS — C7951 Secondary malignant neoplasm of bone: Secondary | ICD-10-CM | POA: Diagnosis not present

## 2022-08-07 DIAGNOSIS — I428 Other cardiomyopathies: Secondary | ICD-10-CM | POA: Diagnosis not present

## 2022-08-07 DIAGNOSIS — C7951 Secondary malignant neoplasm of bone: Secondary | ICD-10-CM | POA: Diagnosis not present

## 2022-08-07 DIAGNOSIS — Z681 Body mass index (BMI) 19 or less, adult: Secondary | ICD-10-CM | POA: Diagnosis not present

## 2022-08-07 DIAGNOSIS — C7989 Secondary malignant neoplasm of other specified sites: Secondary | ICD-10-CM | POA: Diagnosis not present

## 2022-08-07 DIAGNOSIS — R69 Illness, unspecified: Secondary | ICD-10-CM | POA: Diagnosis not present

## 2022-08-07 DIAGNOSIS — C3490 Malignant neoplasm of unspecified part of unspecified bronchus or lung: Secondary | ICD-10-CM | POA: Diagnosis not present

## 2022-08-07 DIAGNOSIS — E46 Unspecified protein-calorie malnutrition: Secondary | ICD-10-CM | POA: Diagnosis not present

## 2022-08-07 DIAGNOSIS — I10 Essential (primary) hypertension: Secondary | ICD-10-CM | POA: Diagnosis not present

## 2022-08-07 DIAGNOSIS — J45909 Unspecified asthma, uncomplicated: Secondary | ICD-10-CM | POA: Diagnosis not present

## 2022-08-08 DIAGNOSIS — Z681 Body mass index (BMI) 19 or less, adult: Secondary | ICD-10-CM | POA: Diagnosis not present

## 2022-08-08 DIAGNOSIS — C7989 Secondary malignant neoplasm of other specified sites: Secondary | ICD-10-CM | POA: Diagnosis not present

## 2022-08-08 DIAGNOSIS — R69 Illness, unspecified: Secondary | ICD-10-CM | POA: Diagnosis not present

## 2022-08-08 DIAGNOSIS — I428 Other cardiomyopathies: Secondary | ICD-10-CM | POA: Diagnosis not present

## 2022-08-08 DIAGNOSIS — J45909 Unspecified asthma, uncomplicated: Secondary | ICD-10-CM | POA: Diagnosis not present

## 2022-08-08 DIAGNOSIS — C3490 Malignant neoplasm of unspecified part of unspecified bronchus or lung: Secondary | ICD-10-CM | POA: Diagnosis not present

## 2022-08-08 DIAGNOSIS — I10 Essential (primary) hypertension: Secondary | ICD-10-CM | POA: Diagnosis not present

## 2022-08-08 DIAGNOSIS — C7951 Secondary malignant neoplasm of bone: Secondary | ICD-10-CM | POA: Diagnosis not present

## 2022-08-08 DIAGNOSIS — E46 Unspecified protein-calorie malnutrition: Secondary | ICD-10-CM | POA: Diagnosis not present

## 2022-08-09 DIAGNOSIS — E46 Unspecified protein-calorie malnutrition: Secondary | ICD-10-CM | POA: Diagnosis not present

## 2022-08-09 DIAGNOSIS — I10 Essential (primary) hypertension: Secondary | ICD-10-CM | POA: Diagnosis not present

## 2022-08-09 DIAGNOSIS — J45909 Unspecified asthma, uncomplicated: Secondary | ICD-10-CM | POA: Diagnosis not present

## 2022-08-09 DIAGNOSIS — R69 Illness, unspecified: Secondary | ICD-10-CM | POA: Diagnosis not present

## 2022-08-09 DIAGNOSIS — I428 Other cardiomyopathies: Secondary | ICD-10-CM | POA: Diagnosis not present

## 2022-08-09 DIAGNOSIS — Z681 Body mass index (BMI) 19 or less, adult: Secondary | ICD-10-CM | POA: Diagnosis not present

## 2022-08-09 DIAGNOSIS — C7951 Secondary malignant neoplasm of bone: Secondary | ICD-10-CM | POA: Diagnosis not present

## 2022-08-09 DIAGNOSIS — C7989 Secondary malignant neoplasm of other specified sites: Secondary | ICD-10-CM | POA: Diagnosis not present

## 2022-08-09 DIAGNOSIS — C3490 Malignant neoplasm of unspecified part of unspecified bronchus or lung: Secondary | ICD-10-CM | POA: Diagnosis not present

## 2022-08-10 DIAGNOSIS — R69 Illness, unspecified: Secondary | ICD-10-CM | POA: Diagnosis not present

## 2022-08-10 DIAGNOSIS — Z681 Body mass index (BMI) 19 or less, adult: Secondary | ICD-10-CM | POA: Diagnosis not present

## 2022-08-10 DIAGNOSIS — C3490 Malignant neoplasm of unspecified part of unspecified bronchus or lung: Secondary | ICD-10-CM | POA: Diagnosis not present

## 2022-08-10 DIAGNOSIS — I10 Essential (primary) hypertension: Secondary | ICD-10-CM | POA: Diagnosis not present

## 2022-08-10 DIAGNOSIS — I428 Other cardiomyopathies: Secondary | ICD-10-CM | POA: Diagnosis not present

## 2022-08-10 DIAGNOSIS — E46 Unspecified protein-calorie malnutrition: Secondary | ICD-10-CM | POA: Diagnosis not present

## 2022-08-10 DIAGNOSIS — J45909 Unspecified asthma, uncomplicated: Secondary | ICD-10-CM | POA: Diagnosis not present

## 2022-08-10 DIAGNOSIS — C7989 Secondary malignant neoplasm of other specified sites: Secondary | ICD-10-CM | POA: Diagnosis not present

## 2022-08-10 DIAGNOSIS — C7951 Secondary malignant neoplasm of bone: Secondary | ICD-10-CM | POA: Diagnosis not present

## 2022-08-26 DEATH — deceased

## 2022-11-08 ENCOUNTER — Other Ambulatory Visit: Payer: 59

## 2022-11-11 ENCOUNTER — Ambulatory Visit: Payer: 59 | Admitting: Cardiology
